# Patient Record
Sex: Female | Born: 1950 | Race: White | Hispanic: No | Marital: Married | Smoking: Never smoker
Health system: Southern US, Community
[De-identification: ages and names within clinical notes are randomized; demographics above are authoritative.]

## PROBLEM LIST (undated history)

## (undated) DIAGNOSIS — C801 Malignant (primary) neoplasm, unspecified: Secondary | ICD-10-CM

## (undated) DIAGNOSIS — M1990 Unspecified osteoarthritis, unspecified site: Secondary | ICD-10-CM

## (undated) DIAGNOSIS — E785 Hyperlipidemia, unspecified: Secondary | ICD-10-CM

## (undated) DIAGNOSIS — G4730 Sleep apnea, unspecified: Secondary | ICD-10-CM

## (undated) DIAGNOSIS — K219 Gastro-esophageal reflux disease without esophagitis: Secondary | ICD-10-CM

## (undated) DIAGNOSIS — I10 Essential (primary) hypertension: Secondary | ICD-10-CM

## (undated) DIAGNOSIS — M719 Bursopathy, unspecified: Secondary | ICD-10-CM

## (undated) DIAGNOSIS — E119 Type 2 diabetes mellitus without complications: Secondary | ICD-10-CM

## (undated) HISTORY — DX: Sleep apnea, unspecified: G47.30

## (undated) HISTORY — PX: EYE SURGERY: SHX253

## (undated) HISTORY — DX: Gastro-esophageal reflux disease without esophagitis: K21.9

## (undated) HISTORY — DX: Type 2 diabetes mellitus without complications: E11.9

## (undated) HISTORY — DX: Hyperlipidemia, unspecified: E78.5

## (undated) HISTORY — DX: Unspecified osteoarthritis, unspecified site: M19.90

## (undated) HISTORY — PX: OTHER SURGICAL HISTORY: SHX169

## (undated) HISTORY — PX: TUBAL LIGATION: SHX77

## (undated) HISTORY — PX: DILATION AND CURETTAGE OF UTERUS: SHX78

## (undated) HISTORY — DX: Bursopathy, unspecified: M71.9

---

## 2001-07-14 ENCOUNTER — Other Ambulatory Visit: Admission: RE | Admit: 2001-07-14 | Discharge: 2001-07-14 | Payer: Self-pay | Admitting: Family Medicine

## 2007-07-03 ENCOUNTER — Ambulatory Visit: Payer: Self-pay | Admitting: General Practice

## 2008-02-01 ENCOUNTER — Ambulatory Visit: Payer: Self-pay | Admitting: General Practice

## 2008-09-03 ENCOUNTER — Ambulatory Visit: Payer: Self-pay | Admitting: General Practice

## 2009-07-22 ENCOUNTER — Emergency Department: Payer: Self-pay | Admitting: Emergency Medicine

## 2010-08-11 ENCOUNTER — Ambulatory Visit: Payer: Self-pay

## 2010-12-27 ENCOUNTER — Emergency Department: Payer: Self-pay | Admitting: Unknown Physician Specialty

## 2011-04-14 ENCOUNTER — Ambulatory Visit: Payer: Self-pay | Admitting: Family Medicine

## 2011-08-17 ENCOUNTER — Ambulatory Visit: Payer: Self-pay

## 2012-08-24 ENCOUNTER — Ambulatory Visit: Payer: Self-pay

## 2013-08-28 ENCOUNTER — Ambulatory Visit: Payer: Self-pay | Admitting: *Deleted

## 2014-08-29 ENCOUNTER — Ambulatory Visit: Payer: Self-pay

## 2015-03-11 ENCOUNTER — Telehealth: Payer: Self-pay | Admitting: Unknown Physician Specialty

## 2015-03-11 MED ORDER — TELMISARTAN-HCTZ 80-25 MG PO TABS: 1.0000 | ORAL_TABLET | Freq: Every day | ORAL | Status: DC

## 2015-03-11 NOTE — Telephone Encounter (Signed)
Routing to provider. Medication is not in epic yet, but is in practice partner and patient chart number is [id number].

## 2015-03-11 NOTE — Telephone Encounter (Signed)
[facility] [place] called stated pt needs refill on:  Telmisartan  Thanks.

## 2015-03-18 ENCOUNTER — Other Ambulatory Visit: Payer: Self-pay

## 2015-03-18 MED ORDER — INVOKANA 300 MG PO TABS: 300.0000 mg | ORAL_TABLET | Freq: Every day | ORAL | Status: DC

## 2015-04-08 DIAGNOSIS — E119 Type 2 diabetes mellitus without complications: Secondary | ICD-10-CM | POA: Insufficient documentation

## 2015-04-08 DIAGNOSIS — J309 Allergic rhinitis, unspecified: Secondary | ICD-10-CM | POA: Insufficient documentation

## 2015-04-08 DIAGNOSIS — E1159 Type 2 diabetes mellitus with other circulatory complications: Secondary | ICD-10-CM | POA: Insufficient documentation

## 2015-04-08 DIAGNOSIS — I10 Essential (primary) hypertension: Secondary | ICD-10-CM

## 2015-04-08 DIAGNOSIS — L719 Rosacea, unspecified: Secondary | ICD-10-CM | POA: Insufficient documentation

## 2015-04-08 DIAGNOSIS — E669 Obesity, unspecified: Secondary | ICD-10-CM | POA: Insufficient documentation

## 2015-04-08 DIAGNOSIS — E785 Hyperlipidemia, unspecified: Secondary | ICD-10-CM

## 2015-04-08 DIAGNOSIS — K219 Gastro-esophageal reflux disease without esophagitis: Secondary | ICD-10-CM | POA: Insufficient documentation

## 2015-04-08 DIAGNOSIS — I4891 Unspecified atrial fibrillation: Secondary | ICD-10-CM

## 2015-04-08 DIAGNOSIS — E1169 Type 2 diabetes mellitus with other specified complication: Secondary | ICD-10-CM | POA: Insufficient documentation

## 2015-04-09 ENCOUNTER — Ambulatory Visit (INDEPENDENT_AMBULATORY_CARE_PROVIDER_SITE_OTHER): Payer: PRIVATE HEALTH INSURANCE | Admitting: Unknown Physician Specialty

## 2015-04-09 ENCOUNTER — Encounter: Payer: Self-pay | Admitting: Unknown Physician Specialty

## 2015-04-09 VITALS — BP 135/74 | HR 82 | Temp 97.9°F | Ht 62.6 in | Wt 175.6 lb

## 2015-04-09 DIAGNOSIS — E119 Type 2 diabetes mellitus without complications: Secondary | ICD-10-CM

## 2015-04-09 DIAGNOSIS — E785 Hyperlipidemia, unspecified: Secondary | ICD-10-CM

## 2015-04-09 DIAGNOSIS — I10 Essential (primary) hypertension: Secondary | ICD-10-CM | POA: Diagnosis not present

## 2015-04-09 LAB — BAYER DCA HB A1C WAIVED: HB A1C: 7.1 % — AB (ref <7.0)

## 2015-04-09 MED ORDER — SIMVASTATIN 20 MG PO TABS: 20.0000 mg | ORAL_TABLET | Freq: Every day | ORAL | Status: DC

## 2015-04-09 MED ORDER — METFORMIN HCL 1000 MG PO TABS: 1000.0000 mg | ORAL_TABLET | Freq: Two times a day (BID) | ORAL | Status: DC

## 2015-04-09 MED ORDER — INVOKANA 300 MG PO TABS: 300.0000 mg | ORAL_TABLET | Freq: Every day | ORAL | Status: DC

## 2015-04-09 MED ORDER — JANUVIA 100 MG PO TABS: 100.0000 mg | ORAL_TABLET | Freq: Every day | ORAL | Status: DC

## 2015-04-09 MED ORDER — TELMISARTAN-HCTZ 80-25 MG PO TABS: 1.0000 | ORAL_TABLET | Freq: Every day | ORAL | Status: DC

## 2015-04-09 NOTE — Assessment & Plan Note (Signed)
Needs Lipid panel

## 2015-04-09 NOTE — Assessment & Plan Note (Signed)
Hgb A1C is 7.1 down from 8.1 last visit.  She would like to contine her diet and exercise changes to get to goal.

## 2015-04-09 NOTE — Assessment & Plan Note (Signed)
BP is excellent.  Continue present medications.

## 2015-04-09 NOTE — Progress Notes (Signed)
BP 135/74 mmHg  Pulse 82  Temp(Src) 97.9 F (36.6 C)  Ht 5' 2.6" (1.59 m)  Wt 175 lb 9.6 oz (79.652 kg)  BMI 31.51 kg/m2  SpO2 96%  LMP 07/13/2000 (Approximate)   Subjective:    Patient ID: Charlene Juarez, female    DOB: [birth year], 64 y.o.   MRN: [id number]  HPI: Charlene Juarez is a 64 y.o. female  Chief Complaint  Patient presents with  . Diabetes  . Hypertension  . Hyperlipidemia   Of note, pt is refusing all labwork besides Hgb A1C.  She states she will get them at work but they are "between nurses."  She will talk to them tomorrow.    Diabetes She presents for her follow-up diabetic visit. She has type 2 diabetes mellitus. Her disease course has been stable. There are no hypoglycemic associated symptoms. Pertinent negatives for diabetes include no blurred vision, no chest pain, no fatigue, no foot paresthesias, no foot ulcerations, no polydipsia, no polyphagia, no polyuria, no visual change, no weakness and no weight loss. There are no hypoglycemic complications. There are no diabetic complications. Her weight is stable. She is following a generally healthy diet. She monitors blood glucose at home 3-4 x per week. Her breakfast blood glucose range is generally 130-140 mg/dl. Her dinner blood glucose range is generally 140-180 mg/dl.  Hypertension This is a chronic problem. The problem is controlled. Pertinent negatives include no blurred vision, chest pain or palpitations. There are no compliance problems.   Hyperlipidemia This is a chronic problem. The problem is controlled. Pertinent negatives include no chest pain. There are no compliance problems.    Hgb A1C 8.1 last visit    Relevant past medical, surgical, family and social history reviewed and updated as indicated. Interim medical history since our last visit reviewed. Allergies and medications reviewed and updated.  Review of Systems  Constitutional: Negative for weight loss and fatigue.   Eyes: Negative for blurred vision.  Cardiovascular: Negative for chest pain and palpitations.  Endocrine: Negative for polydipsia, polyphagia and polyuria.  Neurological: Negative for weakness.    Per HPI unless specifically indicated above     Objective:    BP 135/74 mmHg  Pulse 82  Temp(Src) 97.9 F (36.6 C)  Ht 5' 2.6" (1.59 m)  Wt 175 lb 9.6 oz (79.652 kg)  BMI 31.51 kg/m2  SpO2 96%  LMP 07/13/2000 (Approximate)  Wt Readings from Last 3 Encounters:  04/09/15 175 lb 9.6 oz (79.652 kg)  12/30/14 182 lb (82.555 kg)    Physical Exam  Constitutional: She is oriented to person, place, and time. She appears well-developed and well-nourished. No distress.  HENT:  Head: Normocephalic and atraumatic.  Eyes: Conjunctivae and lids are normal. Right eye exhibits no discharge. Left eye exhibits no discharge. No scleral icterus.  Cardiovascular: Normal rate, regular rhythm and normal heart sounds.   Pulmonary/Chest: Effort normal and breath sounds normal. No respiratory distress.  Abdominal: Soft. Normal appearance and bowel sounds are normal. She exhibits no distension. There is no splenomegaly or hepatomegaly. There is no tenderness.  Musculoskeletal: Normal range of motion.  Neurological: She is alert and oriented to person, place, and time.  Skin: Skin is intact. No rash noted. No pallor.  Psychiatric: She has a normal mood and affect. Her behavior is normal. Judgment and thought content normal.  Vitals reviewed.   No results found for this or any previous visit.    Assessment & Plan:   Problem List Items  Addressed This Visit      Cardiovascular and Mediastinum   Hypertension    BP is excellent.  Continue present medications.        Relevant Medications   telmisartan-hydrochlorothiazide (MICARDIS HCT) 80-25 MG per tablet   simvastatin (ZOCOR) 20 MG tablet     Endocrine   Diabetes - Primary    Hgb A1C is 7.1 down from 8.1 last visit.  She would like to contine her  diet and exercise changes to get to goal.        Relevant Medications   JANUVIA 100 MG tablet   metFORMIN (GLUCOPHAGE) 1000 MG tablet   telmisartan-hydrochlorothiazide (MICARDIS HCT) 80-25 MG per tablet   INVOKANA 300 MG TABS tablet   simvastatin (ZOCOR) 20 MG tablet   Other Relevant Orders   Bayer DCA Hb A1c Waived     Other   Hyperlipidemia    Needs Lipid panel      Relevant Medications   telmisartan-hydrochlorothiazide (MICARDIS HCT) 80-25 MG per tablet   simvastatin (ZOCOR) 20 MG tablet      I am writing an order to get CMP, Uric Acid, Microalbumin, and Lipid panel at work.    Follow up plan: Return in about 3 months (around 07/10/2015) for DM and Hgb A1C.

## 2015-05-09 ENCOUNTER — Encounter: Payer: Self-pay | Admitting: Unknown Physician Specialty

## 2015-05-09 ENCOUNTER — Ambulatory Visit (INDEPENDENT_AMBULATORY_CARE_PROVIDER_SITE_OTHER): Payer: PRIVATE HEALTH INSURANCE | Admitting: Unknown Physician Specialty

## 2015-05-09 VITALS — BP 137/81 | HR 88 | Temp 97.4°F | Ht 61.7 in | Wt 178.4 lb

## 2015-05-09 DIAGNOSIS — J4531 Mild persistent asthma with (acute) exacerbation: Secondary | ICD-10-CM | POA: Diagnosis not present

## 2015-05-09 DIAGNOSIS — J40 Bronchitis, not specified as acute or chronic: Secondary | ICD-10-CM | POA: Diagnosis not present

## 2015-05-09 DIAGNOSIS — M7552 Bursitis of left shoulder: Secondary | ICD-10-CM | POA: Diagnosis not present

## 2015-05-09 DIAGNOSIS — J45909 Unspecified asthma, uncomplicated: Secondary | ICD-10-CM | POA: Insufficient documentation

## 2015-05-09 MED ORDER — MELOXICAM 15 MG PO TABS: 15.0000 mg | ORAL_TABLET | Freq: Every day | ORAL | Status: DC

## 2015-05-09 MED ORDER — BUDESONIDE-FORMOTEROL FUMARATE 160-4.5 MCG/ACT IN AERO: 2.0000 | INHALATION_SPRAY | Freq: Two times a day (BID) | RESPIRATORY_TRACT | Status: DC

## 2015-05-09 MED ORDER — AZITHROMYCIN 250 MG PO TABS: ORAL_TABLET | ORAL | Status: DC

## 2015-05-09 NOTE — Progress Notes (Signed)
BP 137/81 mmHg  Pulse 88  Temp(Src) 97.4 F (36.3 C)  Ht 5' 1.7" (1.567 m)  Wt 178 lb 6.4 oz (80.922 kg)  BMI 32.96 kg/m2  SpO2 94%  LMP 07/13/2000 (Approximate)   Subjective:    Patient ID: Charlene Juarez, female    DOB: April 12, 1951, 64 y.o.   MRN: [id number]  HPI: Charlene Juarez is a 64 y.o. female  Chief Complaint  Patient presents with  . Shoulder Pain    patient has some left shoulder pain that started Wednesday after lifting heaving packages at work   Shoulder Pain  The pain is present in the left shoulder. This is a chronic (States she always has pain left shoulder.  3 days ago hung 19 pound packages at work) problem. Episode onset: 3 days ago. There has been no history of extremity trauma. The problem occurs constantly. The quality of the pain is described as aching. Pertinent negatives include no fever. Exacerbated by: lifting arm. She has tried NSAIDS for the symptoms. The treatment provided mild relief.  Cough Episode onset: 3-4 weeks ago cold symptoms.  Now has a dry cough. The problem has been unchanged (in the past week). Episode frequency: In the AM worse and when she eats. The cough is non-productive. Associated symptoms include wheezing. Pertinent negatives include no chest pain, ear pain, fever, nasal congestion, rhinorrhea, sore throat or shortness of breath. She has tried a beta-agonist inhaler for the symptoms. The treatment provided mild relief.    Relevant past medical, surgical, family and social history reviewed and updated as indicated. Interim medical history since our last visit reviewed. Allergies and medications reviewed and updated.  Review of Systems  Constitutional: Negative for fever.  HENT: Negative for ear pain, rhinorrhea and sore throat.   Respiratory: Positive for cough and wheezing. Negative for shortness of breath.   Cardiovascular: Negative for chest pain.    Per HPI unless specifically indicated above      Objective:    BP 137/81 mmHg  Pulse 88  Temp(Src) 97.4 F (36.3 C)  Ht 5' 1.7" (1.567 m)  Wt 178 lb 6.4 oz (80.922 kg)  BMI 32.96 kg/m2  SpO2 94%  LMP 07/13/2000 (Approximate)  Wt Readings from Last 3 Encounters:  05/09/15 178 lb 6.4 oz (80.922 kg)  04/09/15 175 lb 9.6 oz (79.652 kg)  12/30/14 182 lb (82.555 kg)    Physical Exam  Constitutional: She is oriented to person, place, and time. She appears well-developed and well-nourished. No distress.  HENT:  Head: Normocephalic and atraumatic.  Eyes: Conjunctivae and lids are normal. Right eye exhibits no discharge. Left eye exhibits no discharge. No scleral icterus.  Cardiovascular: Normal rate and regular rhythm.   Pulmonary/Chest: Effort normal. No respiratory distress. She has wheezes.  Abdominal: Normal appearance and bowel sounds are normal. She exhibits no distension. There is no splenomegaly or hepatomegaly. There is no tenderness.  Musculoskeletal: Normal range of motion.  Neurological: She is alert and oriented to person, place, and time.  Skin: Skin is warm, dry and intact. No rash noted. No pallor.  Psychiatric: She has a normal mood and affect. Her behavior is normal. Judgment and thought content normal.  Nursing note reviewed.   Results for orders placed or performed in visit on 04/09/15  Bayer DCA Hb A1c Waived  Result Value Ref Range   Bayer DCA Hb A1c Waived 7.1 (H) <7.0 %      Assessment & Plan:   Problem List Items Addressed  This Visit      Unprioritized   Reactive airway disease    Problem with wheezing since "I bcame a diabetic." Will add Xymbicort for just 1 inhaler to get over acute exacerbation.  Will need Spirometry       Other Visit Diagnoses    Shoulder bursitis, left    -  Primary    Secondary to overhead lifting at work.  Will rx Meloxicam but needs to report to worman's comp.  Practice pendulum swings and wall walking     Bronchitis        Rx for Z pack.          Follow up  plan: Return for at regular return visit.

## 2015-05-09 NOTE — Assessment & Plan Note (Signed)
Problem with wheezing since "I bcame a diabetic." Will add Xymbicort for just 1 inhaler to get over acute exacerbation.  Will need Spirometry

## 2015-05-12 ENCOUNTER — Ambulatory Visit (INDEPENDENT_AMBULATORY_CARE_PROVIDER_SITE_OTHER): Payer: PRIVATE HEALTH INSURANCE | Admitting: Unknown Physician Specialty

## 2015-05-12 ENCOUNTER — Encounter: Payer: Self-pay | Admitting: Unknown Physician Specialty

## 2015-05-12 VITALS — BP 153/83 | HR 91 | Temp 98.3°F | Wt 180.6 lb

## 2015-05-12 DIAGNOSIS — Y990 Civilian activity done for income or pay: Secondary | ICD-10-CM

## 2015-05-12 DIAGNOSIS — M7582 Other shoulder lesions, left shoulder: Secondary | ICD-10-CM

## 2015-05-12 NOTE — Progress Notes (Signed)
BP 153/83 mmHg  Pulse 91  Temp(Src) 98.3 F (36.8 C)  Wt 180 lb 9.6 oz (81.92 kg)  SpO2 96%  LMP 07/13/2000 (Approximate)   Subjective:    Patient ID: Charlene Juarez, female    DOB: 01/22/1951, 64 y.o.   MRN: [id number]  HPI: Charlene Juarez is a 64 y.o. female  Chief Complaint  Patient presents with  . 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  She reported it to [employer] comp and sent her back her to do a drug screen.    Shoulder Pain  The pain is present in the left shoulder. This is a new problem. The current episode started 1 to 4 weeks ago. There has been no history of extremity trauma (following overhead work). The problem occurs constantly. The problem has been unchanged. The quality of the pain is described as aching. The pain is moderate. Associated symptoms include a limited range of motion and stiffness. Pertinent negatives include no fever. The symptoms are aggravated by activity and lying down. The treatment provided no relief. Doing wall walking and pendulum swings.  Feeling better since then  Relevant past medical, surgical, family and social history reviewed and updated as indicated. Interim medical history since our last visit reviewed. Allergies and medications reviewed and updated.  Review of Systems  Constitutional: Negative for fever.  Musculoskeletal: Positive for stiffness.    Per HPI unless specifically indicated above  Relevant past medical, surgical, family and social history reviewed and updated as indicated. Interim medical history since our last visit reviewed. Allergies and medications reviewed and updated.  Review of Systems  Per HPI unless specifically indicated above     Objective:    BP 153/83 mmHg  Pulse 91  Temp(Src) 98.3 F (36.8 C)  Wt 180 lb 9.6 oz (81.92 kg)  SpO2 96%  LMP 07/13/2000  (Approximate)  Wt Readings from Last 3 Encounters:  05/12/15 180 lb 9.6 oz (81.92 kg)  05/09/15 178 lb 6.4 oz (80.922 kg)  04/09/15 175 lb 9.6 oz (79.652 kg)    Physical Exam  Constitutional: She is oriented to person, place, and time. She appears well-developed and well-nourished. No distress.  HENT:  Head: Normocephalic and atraumatic.  Eyes: Conjunctivae and lids are normal. Right eye exhibits no discharge. Left eye exhibits no discharge. No scleral icterus.  Cardiovascular: Normal rate and regular rhythm.   Pulmonary/Chest: Effort normal. No respiratory distress.  Abdominal: Normal appearance and bowel sounds are normal. She exhibits no distension. There is no splenomegaly or hepatomegaly. There is no tenderness.  Musculoskeletal:       Left shoulder: She exhibits decreased range of motion. She exhibits no tenderness and no swelling.  Neurological: She is alert and oriented to person, place, and time.  Skin: Skin is intact. No rash noted. No pallor.  Psychiatric: She has a normal mood and affect. Her behavior is normal. Judgment and thought content normal.    Results for orders placed or performed in visit on 04/09/15  Bayer DCA Hb A1c Waived  Result Value Ref Range   Bayer DCA Hb A1c Waived 7.1 (H) <7.0 %      Assessment & Plan:   Problem List Items Addressed This Visit    None    Visit Diagnoses    Work related injury    -  Primary  Relevant Orders    Drugs of abuse scrn w alc, routine urine    Rotator cuff tendonitis, left           Refer to physical therapy.  Talked to her work and they said their workman's comp provider will refer to PT.   Letter for work limitations written.    Follow up plan: Return in about 2 weeks (around 05/26/2015).

## 2015-05-13 ENCOUNTER — Encounter: Payer: Self-pay | Admitting: Unknown Physician Specialty

## 2015-05-13 LAB — URINE DRUGS OF ABUSE SCREEN W ALC, ROUTINE (REF LAB)
Amphetamines, Urine: NEGATIVE ng/mL
Barbiturate Quant, Ur: NEGATIVE ng/mL
Benzodiazepine Quant, Ur: NEGATIVE ng/mL
COCAINE (METAB.): NEGATIVE ng/mL
Cannabinoid Quant, Ur: NEGATIVE ng/mL
Ethanol U, Quan: NEGATIVE %
Methadone Screen, Urine: NEGATIVE ng/mL
OPIATE QUANT UR: NEGATIVE ng/mL
PCP QUANT UR: NEGATIVE ng/mL
PROPOXYPHENE: NEGATIVE ng/mL

## 2015-05-27 ENCOUNTER — Ambulatory Visit: Payer: Self-pay | Admitting: Unknown Physician Specialty

## 2015-06-04 ENCOUNTER — Other Ambulatory Visit: Payer: Self-pay | Admitting: Unknown Physician Specialty

## 2015-06-04 DIAGNOSIS — D696 Thrombocytopenia, unspecified: Secondary | ICD-10-CM | POA: Insufficient documentation

## 2015-06-04 NOTE — Telephone Encounter (Signed)
Labs July 2016 reviewed (scanned copy) Invokana approved Platelet count was low; I will deny the meloxicam and leave for primary provider who is out of the office this week; she'll be back in five days

## 2015-06-06 ENCOUNTER — Other Ambulatory Visit: Payer: Self-pay

## 2015-06-06 MED ORDER — BUDESONIDE-FORMOTEROL FUMARATE 160-4.5 MCG/ACT IN AERO: 2.0000 | INHALATION_SPRAY | Freq: Two times a day (BID) | RESPIRATORY_TRACT | Status: DC

## 2015-06-06 NOTE — Telephone Encounter (Signed)
[facility] [place].

## 2015-06-11 ENCOUNTER — Ambulatory Visit (INDEPENDENT_AMBULATORY_CARE_PROVIDER_SITE_OTHER): Payer: PRIVATE HEALTH INSURANCE | Admitting: Unknown Physician Specialty

## 2015-06-11 ENCOUNTER — Encounter: Payer: Self-pay | Admitting: Unknown Physician Specialty

## 2015-06-11 VITALS — BP 129/76 | HR 75 | Temp 98.1°F | Ht 61.2 in | Wt 177.0 lb

## 2015-06-11 DIAGNOSIS — J4520 Mild intermittent asthma, uncomplicated: Secondary | ICD-10-CM | POA: Insufficient documentation

## 2015-06-11 DIAGNOSIS — M7551 Bursitis of right shoulder: Secondary | ICD-10-CM | POA: Diagnosis not present

## 2015-06-11 DIAGNOSIS — J4540 Moderate persistent asthma, uncomplicated: Secondary | ICD-10-CM

## 2015-06-11 DIAGNOSIS — M7550 Bursitis of unspecified shoulder: Secondary | ICD-10-CM | POA: Insufficient documentation

## 2015-06-11 NOTE — Progress Notes (Signed)
BP 129/76 mmHg  Pulse 75  Temp(Src) 98.1 F (36.7 C)  Ht 5' 1.2" (1.554 m)  Wt 177 lb (80.287 kg)  BMI 33.25 kg/m2  SpO2 97%  LMP 07/13/2000 (Approximate)   Subjective:    Patient ID: Charlene Juarez, female    DOB: [birth year], 64 y.o.   MRN: [id number]  HPI: Charlene Juarez is a 64 y.o. female  Chief Complaint  Patient presents with  . Shoulder Pain    pt states right shoulder has been hurting for the last 3 or 4 days  . Medication Refill    pt states she would like a refill on Symbicort and Meloxicam   Shoulder Pain  The pain is present in the right shoulder. This is a chronic (right shoulder bothers off and on.  Would like Meloxicam) problem. The current episode started in the past 7 days. There has been no history of extremity trauma (but aggravated push mopping at work). The problem occurs constantly. The problem has been waxing and waning. The quality of the pain is described as aching. The pain is moderate. Pertinent negatives include no fever, inability to bear weight, itching, joint locking, joint swelling, limited range of motion, numbness, stiffness or tingling. The symptoms are aggravated by activity. Treatments tried: Meloxicam. Her past medical history is significant for diabetes. There is no history of gout, osteoarthritis or rheumatoid arthritis.  Cough Chronicity: When she gets hot she starts coughing.  Wants a refill of symbicort. The problem has been unchanged. The cough is non-productive. Associated symptoms include nasal congestion and sweats. Pertinent negatives include no fever. Nothing aggravates the symptoms. The treatment provided moderate relief. Her past medical history is significant for asthma.   She would also like Lidoderm patches.      Relevant past medical, surgical, family and social history reviewed and updated as indicated. Interim medical history since our last visit reviewed. Allergies and medications reviewed and  updated.  Review of Systems  Constitutional: Negative for fever.  Respiratory: Positive for cough.   Musculoskeletal: Negative for stiffness and gout.  Skin: Negative for itching.  Neurological: Negative for tingling and numbness.    Per HPI unless specifically indicated above     Objective:    BP 129/76 mmHg  Pulse 75  Temp(Src) 98.1 F (36.7 C)  Ht 5' 1.2" (1.554 m)  Wt 177 lb (80.287 kg)  BMI 33.25 kg/m2  SpO2 97%  LMP 07/13/2000 (Approximate)  Wt Readings from Last 3 Encounters:  06/11/15 177 lb (80.287 kg)  05/12/15 180 lb 9.6 oz (81.92 kg)  05/09/15 178 lb 6.4 oz (80.922 kg)    Physical Exam  Constitutional: She is oriented to person, place, and time. She appears well-developed and well-nourished. No distress.  HENT:  Head: Normocephalic and atraumatic.  Eyes: Conjunctivae and lids are normal. Right eye exhibits no discharge. Left eye exhibits no discharge. No scleral icterus.  Cardiovascular: Normal rate, regular rhythm and normal heart sounds.   Pulmonary/Chest: Effort normal and breath sounds normal. No respiratory distress.  Abdominal: Normal appearance and bowel sounds are normal. She exhibits no distension. There is no splenomegaly or hepatomegaly. There is no tenderness.  Musculoskeletal: Normal range of motion.       Right shoulder: She exhibits normal range of motion, no tenderness, no bony tenderness, no swelling, no effusion and no crepitus.  Positive empty can  Neurological: She is alert and oriented to person, place, and time.  Skin: Skin is warm, dry and intact. No  rash noted. No pallor.  Psychiatric: She has a normal mood and affect. Her behavior is normal. Judgment and thought content normal.  Nursing note and vitals reviewed.     Assessment & Plan:   Problem List Items Addressed This Visit      Unprioritized   Asthma, moderate persistent - Primary    Refill Symbicort. Spirometry excellent      Relevant Orders   Spirometry with Graph    Shoulder bursitis    Refill Meloxicam.  Refusing PT.  Wants Lidoderm patches which a friend gave her         Unable to put rx's in computer due to unknown technical problems.    Follow up plan: No Follow-up on file.   Level 4 visit.  Due to see me in 1 month for DM.  Unable to close visit at this time due to unknown techenical problems.

## 2015-06-11 NOTE — Assessment & Plan Note (Signed)
Refill Meloxicam.  Refusing PT.  Wants Lidoderm patches which a friend gave her

## 2015-06-11 NOTE — Assessment & Plan Note (Addendum)
Refill Symbicort. Spirometry excellent

## 2015-06-25 ENCOUNTER — Other Ambulatory Visit: Payer: Self-pay | Admitting: Unknown Physician Specialty

## 2015-07-02 ENCOUNTER — Other Ambulatory Visit: Payer: Self-pay | Admitting: Unknown Physician Specialty

## 2015-07-04 ENCOUNTER — Other Ambulatory Visit: Payer: Self-pay

## 2015-07-04 MED ORDER — SIMVASTATIN 20 MG PO TABS: 20.0000 mg | ORAL_TABLET | Freq: Every day | ORAL | Status: DC

## 2015-07-04 NOTE — Telephone Encounter (Signed)
PATIENT: Charlene Juarez DOB: Oct 20, 1950 PHARMACY: [facility] [place] LAST VISIT: 06/11/2015  Patient requests simvastatin 20 mg tab # 90

## 2015-07-15 ENCOUNTER — Ambulatory Visit: Payer: PRIVATE HEALTH INSURANCE | Admitting: Unknown Physician Specialty

## 2015-07-16 ENCOUNTER — Ambulatory Visit (INDEPENDENT_AMBULATORY_CARE_PROVIDER_SITE_OTHER): Payer: BLUE CROSS/BLUE SHIELD | Admitting: Unknown Physician Specialty

## 2015-07-16 ENCOUNTER — Encounter: Payer: Self-pay | Admitting: Unknown Physician Specialty

## 2015-07-16 VITALS — BP 144/81 | HR 80 | Temp 97.7°F | Ht 62.5 in | Wt 175.6 lb

## 2015-07-16 DIAGNOSIS — M7551 Bursitis of right shoulder: Secondary | ICD-10-CM

## 2015-07-16 DIAGNOSIS — E785 Hyperlipidemia, unspecified: Secondary | ICD-10-CM | POA: Diagnosis not present

## 2015-07-16 DIAGNOSIS — J4540 Moderate persistent asthma, uncomplicated: Secondary | ICD-10-CM

## 2015-07-16 DIAGNOSIS — E119 Type 2 diabetes mellitus without complications: Secondary | ICD-10-CM | POA: Diagnosis not present

## 2015-07-16 DIAGNOSIS — I10 Essential (primary) hypertension: Secondary | ICD-10-CM

## 2015-07-16 MED ORDER — HYDROCODONE-ACETAMINOPHEN 5-325 MG PO TABS: 1.0000 | ORAL_TABLET | Freq: Four times a day (QID) | ORAL | Status: DC | PRN

## 2015-07-16 NOTE — Assessment & Plan Note (Signed)
Under excellent control today with Hgb A1C 6.9

## 2015-07-16 NOTE — Assessment & Plan Note (Signed)
-  

## 2015-07-16 NOTE — Assessment & Plan Note (Signed)
Stable, continue present medications.   

## 2015-07-16 NOTE — Assessment & Plan Note (Signed)
A little high today as in pain and not sleeping

## 2015-07-16 NOTE — Progress Notes (Signed)
BP 144/81 mmHg  Pulse 80  Temp(Src) 97.7 F (36.5 C)  Ht 5' 2.5" (1.588 m)  Wt 175 lb 9.6 oz (79.652 kg)  BMI 31.59 kg/m2  SpO2 98%  LMP 07/13/2000 (Approximate)   Subjective:    Patient ID: Charlene Juarez, female    DOB: 15-Sep-1951, 64 y.o.   MRN: [id number]  HPI: Charlene Juarez is a 64 y.o. female  Chief Complaint  Patient presents with  . Diabetes  . Hyperlipidemia  . Hypertension  . Shoulder Pain    pt states she is still having pain in both shoulders and she cannot sleep because of pain  . Irritable Bowel Syndrome    pt states she has been having IBS problems that started years ago   Pt is here with her son who is an Therapist, sports. Labs are reviewed from the outside.     Shoulder Pt states she has a different insurance as she retired due to her "shoulder situation."  She wants to try for disability.  She states she can't sleep at night because she has sharp pain bilateral shoulders.  Her son is very concerned about this.  She is taking 2 Meloxicam/day.  Both shoulders hurt, right worse than left.  Went to the [facility] doctor who said it was a strain by lifting heavy yarns as no work accomodations could be made.  Light duty was given but only for a week.   Diabetes  The city drew her labs and they were supposed to be faxing her labs.  BG at home ranges from 109-145.  She is doing well with present medications.    Hypertension/ hyperlipidemia Stable on present medications.  LDL is good at 69.    Asthma She is not taking the Symbicort and instead taking her Albuterol when she gets SOB.  She does have a chronic dry cough.  Her son wonders if it is the Micardis.   IBS  Has had diarrhea since 2009.  She does have diarrhea and feels she needs to go to the bathroom multiple/tims/day.  She states this started before being on Metformin D  Relevant past medical, surgical, family and social history reviewed and updated as indicated. Interim medical history since our last  visit reviewed. Allergies and medications reviewed and updated.  Review of Systems  Per HPI unless specifically indicated above     Objective:    BP 144/81 mmHg  Pulse 80  Temp(Src) 97.7 F (36.5 C)  Ht 5' 2.5" (1.588 m)  Wt 175 lb 9.6 oz (79.652 kg)  BMI 31.59 kg/m2  SpO2 98%  LMP 07/13/2000 (Approximate)  Wt Readings from Last 3 Encounters:  07/16/15 175 lb 9.6 oz (79.652 kg)  06/11/15 177 lb (80.287 kg)  05/12/15 180 lb 9.6 oz (81.92 kg)    Physical Exam  Constitutional: She is oriented to person, place, and time. She appears well-developed and well-nourished. No distress.  HENT:  Head: Normocephalic and atraumatic.  Eyes: Conjunctivae and lids are normal. Right eye exhibits no discharge. Left eye exhibits no discharge. No scleral icterus.  Cardiovascular: Normal rate and regular rhythm.   Pulmonary/Chest: Effort normal. No respiratory distress.  Abdominal: Normal appearance. There is no splenomegaly or hepatomegaly.  Musculoskeletal: Normal range of motion.       Right shoulder: She exhibits tenderness, crepitus, pain and decreased strength. She exhibits normal range of motion, no bony tenderness, no swelling and no effusion.       Left shoulder: She exhibits tenderness, pain  and decreased strength. She exhibits normal range of motion and no swelling.  Positive empty can.  Minor impingement.  Tender along biceps tendon  Neurological: She is alert and oriented to person, place, and time.  Skin: Skin is intact. No rash noted. No pallor.  Psychiatric: She has a normal mood and affect. Her behavior is normal. Judgment and thought content normal.       Assessment & Plan:   Problem List Items Addressed This Visit      Unprioritized   Diabetes ([place])    Under excellent control today with Hgb A1C 6.9      Hypertension    A little high today as in pain and not sleeping      Hyperlipidemia    Stable, continue present medications.       Asthma, moderate persistent     Restart symbicort      Shoulder bursitis - Primary    Persistent and difficult problem causing her to "retire." I will refer to Orthopedics as she is considering disability.  Limited rx of #30 Hydrocodone 5 mg.        Relevant Orders   Ambulatory referral to Orthopedic Surgery       Follow up plan: Return in about 3 months (around 10/16/2015).

## 2015-07-16 NOTE — Assessment & Plan Note (Addendum)
Persistent and difficult problem causing her to "retire." I will refer to Orthopedics as she is considering disability.  Limited rx of #30 Hydrocodone 5 mg.

## 2015-07-17 ENCOUNTER — Other Ambulatory Visit: Payer: Self-pay | Admitting: Unknown Physician Specialty

## 2015-07-23 ENCOUNTER — Other Ambulatory Visit: Payer: Self-pay | Admitting: Family Medicine

## 2015-07-23 NOTE — Telephone Encounter (Signed)
Malachy Mood, I don't see this in her med list here or in PP.

## 2015-08-01 ENCOUNTER — Other Ambulatory Visit: Payer: Self-pay | Admitting: Family Medicine

## 2015-08-01 NOTE — Telephone Encounter (Signed)
Routing to provider  

## 2015-08-23 ENCOUNTER — Other Ambulatory Visit: Payer: Self-pay | Admitting: Family Medicine

## 2015-08-23 ENCOUNTER — Other Ambulatory Visit: Payer: Self-pay | Admitting: Unknown Physician Specialty

## 2015-08-25 ENCOUNTER — Telehealth: Payer: Self-pay | Admitting: Unknown Physician Specialty

## 2015-08-25 ENCOUNTER — Other Ambulatory Visit: Payer: Self-pay | Admitting: Unknown Physician Specialty

## 2015-08-25 MED ORDER — ONETOUCH ULTRA SYSTEM W/DEVICE KIT: 1.0000 | PACK | Freq: Once | Status: DC

## 2015-08-25 NOTE — Telephone Encounter (Signed)
Pt would like to have rx for one touch ultra meter sent to [facility] [place].

## 2015-08-25 NOTE — Telephone Encounter (Signed)
Routing to provider  

## 2015-09-18 ENCOUNTER — Other Ambulatory Visit: Payer: Self-pay | Admitting: Unknown Physician Specialty

## 2015-09-20 ENCOUNTER — Other Ambulatory Visit: Payer: Self-pay | Admitting: Unknown Physician Specialty

## 2015-09-23 ENCOUNTER — Telehealth: Payer: Self-pay | Admitting: Unknown Physician Specialty

## 2015-09-23 NOTE — Telephone Encounter (Signed)
Called and let patient know that patches were refilled 09/18/15.

## 2015-09-23 NOTE — Telephone Encounter (Signed)
#  30 patches were sent on 12/29

## 2015-09-23 NOTE — Telephone Encounter (Signed)
Routing to provider  

## 2015-09-23 NOTE — Telephone Encounter (Signed)
[facility] [place]  Patient needs generic medication Lidoderm sent to her pharmacy.

## 2015-09-26 ENCOUNTER — Telehealth: Payer: Self-pay

## 2015-09-26 ENCOUNTER — Telehealth: Payer: Self-pay | Admitting: Unknown Physician Specialty

## 2015-09-26 MED ORDER — TRAMADOL HCL 50 MG PO TABS: 50.0000 mg | ORAL_TABLET | Freq: Three times a day (TID) | ORAL | Status: DC | PRN

## 2015-09-26 NOTE — Telephone Encounter (Signed)
Tramadol prescription faxed to [facility].

## 2015-09-26 NOTE — Telephone Encounter (Signed)
Called and spoke to patient. I let her know that Charlene Juarez wrote her a prescription for tramadol and it was faxed to her pharmacy.

## 2015-09-26 NOTE — Telephone Encounter (Signed)
I will write an rx for Tramadol.  But will need to be seen for any further refills.  You will probably need to fax it to the pharmacy

## 2015-09-26 NOTE — Telephone Encounter (Signed)
Called patient and she stated she went to orthopedics and they sent her for PT. She states since she started PT, her shoulder has gotten better but it still hurts. Patient states she has bills piling up because of her shoulder.

## 2015-09-26 NOTE — Telephone Encounter (Signed)
Patient wants to know why she doesn't have her pain medication. [facility] [place]. She would like to talk to you or either [place].

## 2015-09-26 NOTE — Telephone Encounter (Signed)
Malachy Mood stated Kristin Bruins needs to talk to [facility] about this.

## 2015-09-26 NOTE — Telephone Encounter (Signed)
Spoke to [place] about this patient's lidocaine patches for her shoulder. PA was denied because medical criteria was not met. When I talked to the patient the other day, she stated she was in a lot of pain. Is there anything else we can do or give the patient?

## 2015-09-26 NOTE — Telephone Encounter (Signed)
Patient called and left me a voicemail stating that she just talked to [facility] about the lidoderm patches. They say that Malachy Mood has to call [phone], extension [phone] and ask for a lady named Linette K. Patient stated Malachy Mood and Linette have to have a peer to peer consult to prove that she is using the lidoderm for her shoulder.

## 2015-09-26 NOTE — Telephone Encounter (Signed)
Called and asked for patient to please return my call.

## 2015-09-26 NOTE — Telephone Encounter (Signed)
Told pt she needs to be seen for appropriate documentation to justify Lidoderm patches.  She states she has no money to come in and will let us know if she can find a way.  I told her that the OTC are almost as strong as the prescription.

## 2015-09-26 NOTE — Telephone Encounter (Signed)
She was referred to orthopedics.  Did they release her from their care?

## 2015-09-28 ENCOUNTER — Other Ambulatory Visit: Payer: Self-pay | Admitting: Unknown Physician Specialty

## 2015-09-29 ENCOUNTER — Other Ambulatory Visit: Payer: Self-pay | Admitting: Unknown Physician Specialty

## 2015-10-16 LAB — HM DIABETES EYE EXAM

## 2015-12-04 ENCOUNTER — Other Ambulatory Visit: Payer: Self-pay | Admitting: Internal Medicine

## 2015-12-04 DIAGNOSIS — J45909 Unspecified asthma, uncomplicated: Secondary | ICD-10-CM

## 2015-12-04 DIAGNOSIS — I10 Essential (primary) hypertension: Secondary | ICD-10-CM

## 2015-12-30 ENCOUNTER — Ambulatory Visit: Payer: Disability Insurance | Attending: Internal Medicine

## 2015-12-30 DIAGNOSIS — I10 Essential (primary) hypertension: Secondary | ICD-10-CM | POA: Diagnosis not present

## 2015-12-30 DIAGNOSIS — E119 Type 2 diabetes mellitus without complications: Secondary | ICD-10-CM | POA: Diagnosis not present

## 2015-12-30 DIAGNOSIS — J45909 Unspecified asthma, uncomplicated: Secondary | ICD-10-CM | POA: Diagnosis present

## 2016-01-17 ENCOUNTER — Other Ambulatory Visit: Payer: Self-pay | Admitting: Unknown Physician Specialty

## 2016-03-03 ENCOUNTER — Other Ambulatory Visit: Payer: Self-pay | Admitting: Family Medicine

## 2016-03-03 DIAGNOSIS — Z1231 Encounter for screening mammogram for malignant neoplasm of breast: Secondary | ICD-10-CM

## 2016-03-16 ENCOUNTER — Other Ambulatory Visit: Payer: Self-pay | Admitting: Family Medicine

## 2016-03-16 ENCOUNTER — Ambulatory Visit
Admission: RE | Admit: 2016-03-16 | Discharge: 2016-03-16 | Disposition: A | Discharge status: Home / Self Care | Payer: BLUE CROSS/BLUE SHIELD | Source: Ambulatory Visit | Attending: Family Medicine | Admitting: Family Medicine

## 2016-03-16 DIAGNOSIS — Z1231 Encounter for screening mammogram for malignant neoplasm of breast: Secondary | ICD-10-CM | POA: Diagnosis not present

## 2016-03-19 ENCOUNTER — Ambulatory Visit: Payer: Disability Insurance

## 2016-03-21 ENCOUNTER — Other Ambulatory Visit: Payer: Self-pay | Admitting: Unknown Physician Specialty

## 2016-07-18 ENCOUNTER — Other Ambulatory Visit: Payer: Self-pay | Admitting: Unknown Physician Specialty

## 2016-09-19 ENCOUNTER — Other Ambulatory Visit: Payer: Self-pay | Admitting: Unknown Physician Specialty

## 2016-10-08 DIAGNOSIS — I10 Essential (primary) hypertension: Secondary | ICD-10-CM | POA: Diagnosis not present

## 2016-10-08 DIAGNOSIS — E119 Type 2 diabetes mellitus without complications: Secondary | ICD-10-CM | POA: Diagnosis not present

## 2016-10-08 DIAGNOSIS — Z6833 Body mass index (BMI) 33.0-33.9, adult: Secondary | ICD-10-CM | POA: Diagnosis not present

## 2016-11-18 DIAGNOSIS — E1165 Type 2 diabetes mellitus with hyperglycemia: Secondary | ICD-10-CM | POA: Diagnosis not present

## 2016-11-18 LAB — HM DIABETES EYE EXAM

## 2016-12-02 DIAGNOSIS — D225 Melanocytic nevi of trunk: Secondary | ICD-10-CM | POA: Diagnosis not present

## 2016-12-02 DIAGNOSIS — D2272 Melanocytic nevi of left lower limb, including hip: Secondary | ICD-10-CM | POA: Diagnosis not present

## 2016-12-02 DIAGNOSIS — L853 Xerosis cutis: Secondary | ICD-10-CM | POA: Diagnosis not present

## 2016-12-02 DIAGNOSIS — D2261 Melanocytic nevi of right upper limb, including shoulder: Secondary | ICD-10-CM | POA: Diagnosis not present

## 2016-12-13 DIAGNOSIS — H2513 Age-related nuclear cataract, bilateral: Secondary | ICD-10-CM | POA: Diagnosis not present

## 2016-12-28 MED ORDER — BUPIVACAINE-EPINEPHRINE (PF) 0.5% -1:200000 IJ SOLN
INTRAMUSCULAR | Status: AC
  Filled 2016-12-28: qty 30

## 2016-12-31 DIAGNOSIS — H2513 Age-related nuclear cataract, bilateral: Secondary | ICD-10-CM | POA: Diagnosis not present

## 2017-01-05 ENCOUNTER — Encounter: Payer: Self-pay | Admitting: *Deleted

## 2017-01-12 MED ORDER — MOXIFLOXACIN HCL 0.5 % OP SOLN: 1.0000 [drp] | OPHTHALMIC | Status: DC | PRN

## 2017-01-12 MED ORDER — ARMC OPHTHALMIC DILATING DROPS
1.0000 "application " | OPHTHALMIC | Status: AC
  Administered 2017-01-13: 1 via OPHTHALMIC

## 2017-01-13 ENCOUNTER — Ambulatory Visit: Payer: Medicare Other | Admitting: Anesthesiology

## 2017-01-13 ENCOUNTER — Encounter: Payer: Self-pay | Admitting: *Deleted

## 2017-01-13 ENCOUNTER — Encounter
Admission: RE | Disposition: A | Discharge status: Home / Self Care | Payer: Self-pay | Source: Ambulatory Visit | Attending: Ophthalmology

## 2017-01-13 ENCOUNTER — Ambulatory Visit
Admission: RE | Admit: 2017-01-13 | Discharge: 2017-01-13 | Disposition: A | Discharge status: Home / Self Care | Payer: Medicare Other | Source: Ambulatory Visit | Attending: Ophthalmology | Admitting: Ophthalmology

## 2017-01-13 DIAGNOSIS — H2511 Age-related nuclear cataract, right eye: Secondary | ICD-10-CM | POA: Diagnosis not present

## 2017-01-13 DIAGNOSIS — Z79899 Other long term (current) drug therapy: Secondary | ICD-10-CM | POA: Insufficient documentation

## 2017-01-13 DIAGNOSIS — G4730 Sleep apnea, unspecified: Secondary | ICD-10-CM | POA: Insufficient documentation

## 2017-01-13 DIAGNOSIS — M1990 Unspecified osteoarthritis, unspecified site: Secondary | ICD-10-CM | POA: Insufficient documentation

## 2017-01-13 DIAGNOSIS — J45909 Unspecified asthma, uncomplicated: Secondary | ICD-10-CM | POA: Insufficient documentation

## 2017-01-13 DIAGNOSIS — K219 Gastro-esophageal reflux disease without esophagitis: Secondary | ICD-10-CM | POA: Diagnosis not present

## 2017-01-13 DIAGNOSIS — I10 Essential (primary) hypertension: Secondary | ICD-10-CM | POA: Diagnosis not present

## 2017-01-13 DIAGNOSIS — E1136 Type 2 diabetes mellitus with diabetic cataract: Secondary | ICD-10-CM | POA: Diagnosis not present

## 2017-01-13 DIAGNOSIS — H2513 Age-related nuclear cataract, bilateral: Secondary | ICD-10-CM | POA: Diagnosis not present

## 2017-01-13 DIAGNOSIS — E119 Type 2 diabetes mellitus without complications: Secondary | ICD-10-CM | POA: Diagnosis not present

## 2017-01-13 HISTORY — PX: CATARACT EXTRACTION W/PHACO: SHX586

## 2017-01-13 HISTORY — DX: Essential (primary) hypertension: I10

## 2017-01-13 HISTORY — DX: Malignant (primary) neoplasm, unspecified: C80.1

## 2017-01-13 LAB — GLUCOSE, CAPILLARY: Glucose-Capillary: 188 mg/dL — ABNORMAL HIGH (ref 65–99)

## 2017-01-13 SURGERY — PHACOEMULSIFICATION, CATARACT, WITH IOL INSERTION
Anesthesia: Monitor Anesthesia Care | Site: Eye | Laterality: Right | Wound class: Clean

## 2017-01-13 MED ORDER — SODIUM CHLORIDE 0.9 % IV SOLN
INTRAVENOUS | Status: DC
  Administered 2017-01-13 (×2): via INTRAVENOUS

## 2017-01-13 MED ORDER — SODIUM HYALURONATE 10 MG/ML IO SOLN
INTRAOCULAR | Status: DC | PRN
  Administered 2017-01-13: 0.55 mL via INTRAOCULAR

## 2017-01-13 MED ORDER — POVIDONE-IODINE 5 % OP SOLN
OPHTHALMIC | Status: DC | PRN
  Administered 2017-01-13: 1 via OPHTHALMIC

## 2017-01-13 MED ORDER — ARMC OPHTHALMIC DILATING DROPS
OPHTHALMIC | Status: AC
  Administered 2017-01-13: 1 via OPHTHALMIC
  Filled 2017-01-13: qty 0.4

## 2017-01-13 MED ORDER — MIDAZOLAM HCL 2 MG/2ML IJ SOLN
INTRAMUSCULAR | Status: DC | PRN
  Administered 2017-01-13 (×2): 0.5 mg via INTRAVENOUS

## 2017-01-13 MED ORDER — EPINEPHRINE PF 1 MG/ML IJ SOLN
INTRAOCULAR | Status: DC | PRN
  Administered 2017-01-13: 08:00:00 via OPHTHALMIC

## 2017-01-13 MED ORDER — GLYCOPYRROLATE 0.2 MG/ML IJ SOLN
INTRAMUSCULAR | Status: AC
  Filled 2017-01-13: qty 1

## 2017-01-13 MED ORDER — LABETALOL HCL 5 MG/ML IV SOLN
INTRAVENOUS | Status: AC
  Filled 2017-01-13: qty 4

## 2017-01-13 MED ORDER — LIDOCAINE HCL (PF) 2 % IJ SOLN
INTRAMUSCULAR | Status: AC
Start: 2017-01-13 — End: 2017-01-13
  Filled 2017-01-13: qty 2

## 2017-01-13 MED ORDER — POVIDONE-IODINE 5 % OP SOLN
OPHTHALMIC | Status: AC
  Filled 2017-01-13: qty 30

## 2017-01-13 MED ORDER — ARMC OPHTHALMIC DILATING DROPS
1.0000 "application " | OPHTHALMIC | Status: DC
  Administered 2017-01-13 (×2): 1 via OPHTHALMIC

## 2017-01-13 MED ORDER — LIDOCAINE HCL (PF) 4 % IJ SOLN
INTRAOCULAR | Status: DC | PRN
  Administered 2017-01-13: 4 mL via OPHTHALMIC

## 2017-01-13 MED ORDER — MOXIFLOXACIN HCL 0.5 % OP SOLN
OPHTHALMIC | Status: AC
  Filled 2017-01-13: qty 3

## 2017-01-13 MED ORDER — SODIUM HYALURONATE 23 MG/ML IO SOLN
INTRAOCULAR | Status: DC | PRN
  Administered 2017-01-13: 0.6 mL via INTRAOCULAR

## 2017-01-13 MED ORDER — MIDAZOLAM HCL 2 MG/2ML IJ SOLN
INTRAMUSCULAR | Status: AC
  Filled 2017-01-13: qty 2

## 2017-01-13 MED ORDER — MOXIFLOXACIN HCL 0.5 % OP SOLN
OPHTHALMIC | Status: DC | PRN
  Administered 2017-01-13: 0.2 mL via OPHTHALMIC

## 2017-01-13 MED ORDER — PROPOFOL 10 MG/ML IV BOLUS
INTRAVENOUS | Status: AC
  Filled 2017-01-13: qty 20

## 2017-01-13 MED ORDER — ALFENTANIL 500 MCG/ML IJ INJ
INJECTION | INTRAVENOUS | Status: DC | PRN
  Administered 2017-01-13 (×2): 250 ug via INTRAVENOUS

## 2017-01-13 MED ORDER — EPINEPHRINE PF 1 MG/ML IJ SOLN
INTRAMUSCULAR | Status: AC
  Filled 2017-01-13: qty 2

## 2017-01-13 SURGICAL SUPPLY — 21 items
CANNULA ANT/CHMB 27G (MISCELLANEOUS) ×2 IMPLANT
CANNULA ANT/CHMB 27GA (MISCELLANEOUS) ×6 IMPLANT
CUP MEDICINE 2OZ PLAST GRAD ST (MISCELLANEOUS) ×3 IMPLANT
DISSECTOR HYDRO NUCLEUS 50X22 (MISCELLANEOUS) ×3 IMPLANT
GLOVE BIO SURGEON STRL SZ8 (GLOVE) ×3 IMPLANT
GLOVE BIOGEL M 6.5 STRL (GLOVE) ×3 IMPLANT
GLOVE SURG LX 7.5 STRW (GLOVE) ×2
GLOVE SURG LX STRL 7.5 STRW (GLOVE) ×1 IMPLANT
GOWN STRL REUS W/ TWL LRG LVL3 (GOWN DISPOSABLE) ×2 IMPLANT
GOWN STRL REUS W/TWL LRG LVL3 (GOWN DISPOSABLE) ×6
LENS IOL TECNIS ITEC 26.0 (Intraocular Lens) ×2 IMPLANT
PACK CATARACT (MISCELLANEOUS) ×3 IMPLANT
PACK CATARACT KING (MISCELLANEOUS) ×3 IMPLANT
PACK EYE AFTER SURG (MISCELLANEOUS) ×3 IMPLANT
SOL BSS BAG (MISCELLANEOUS) ×3
SOLUTION BSS BAG (MISCELLANEOUS) ×1 IMPLANT
SYR 3ML LL SCALE MARK (SYRINGE) ×6 IMPLANT
SYR 5ML LL (SYRINGE) ×3 IMPLANT
SYR TB 1ML 27GX1/2 LL (SYRINGE) ×3 IMPLANT
WATER STERILE IRR 250ML POUR (IV SOLUTION) ×3 IMPLANT
WIPE NON LINTING 3.25X3.25 (MISCELLANEOUS) ×3 IMPLANT

## 2017-01-13 NOTE — Anesthesia Post-op Follow-up Note (Cosign Needed)
Anesthesia QCDR form completed.        

## 2017-01-13 NOTE — Anesthesia Postprocedure Evaluation (Signed)
Anesthesia Post Note  Patient: Charlene Juarez  Procedure(s) Performed: Procedure(s) (LRB): CATARACT EXTRACTION PHACO AND INTRAOCULAR LENS PLACEMENT (IOC) (Right)  Patient location during evaluation: PACU Anesthesia Type: MAC Level of consciousness: awake and alert Pain management: pain level controlled Vital Signs Assessment: post-procedure vital signs reviewed and stable Respiratory status: spontaneous breathing, nonlabored ventilation, respiratory function stable and patient connected to nasal cannula oxygen Cardiovascular status: stable and blood pressure returned to baseline Anesthetic complications: no     Last Vitals:  Vitals:   01/13/17 0812 01/13/17 0815  BP: (!) 111/57 112/61  Pulse: 84 77  Resp: 16   Temp: 36.7 C     Last Pain:  Vitals:   01/13/17 0615  TempSrc: Oral                 Dominik Lauricella S

## 2017-01-13 NOTE — H&P (Signed)
The History and Physical notes are on paper, have been signed, and are to be scanned.   I have examined the patient and there are no changes to the H&P.   Benay Pillow 01/13/2017 7:28 AM

## 2017-01-13 NOTE — Anesthesia Preprocedure Evaluation (Signed)
Anesthesia Evaluation  Patient identified by MRN, date of birth, ID band Patient awake    Reviewed: Allergy & Precautions, NPO status , Patient's Chart, lab work & pertinent test results, reviewed documented beta blocker date and time   Airway Mallampati: II  TM Distance: >3 FB     Dental  (+) Chipped   Pulmonary asthma , sleep apnea ,           Cardiovascular hypertension, Pt. on medications      Neuro/Psych    GI/Hepatic GERD  Controlled,  Endo/Other  diabetes, Type 2  Renal/GU      Musculoskeletal  (+) Arthritis ,   Abdominal   Peds  Hematology   Anesthesia Other Findings   Reproductive/Obstetrics                             Anesthesia Physical Anesthesia Plan  ASA: III  Anesthesia Plan: MAC   Post-op Pain Management:    Induction:   Airway Management Planned:   Additional Equipment:   Intra-op Plan:   Post-operative Plan:   Informed Consent: I have reviewed the patients History and Physical, chart, labs and discussed the procedure including the risks, benefits and alternatives for the proposed anesthesia with the patient or authorized representative who has indicated his/her understanding and acceptance.     Plan Discussed with: CRNA  Anesthesia Plan Comments:         Anesthesia Quick Evaluation

## 2017-01-13 NOTE — Discharge Instructions (Signed)
Eye Surgery Discharge Instructions  Expect mild scratchy sensation or mild soreness. DO NOT RUB YOUR EYE!  The day of surgery:  Minimal physical activity, but bed rest is not required  No reading, computer work, or close hand work  No bending, lifting, or straining.  May watch TV  For 24 hours:  No driving, legal decisions, or alcoholic beverages  Safety precautions  Eat anything you prefer: It is better to start with liquids, then soup then solid foods.  _____ Eye patch should be worn until postoperative exam tomorrow.  ____ Solar shield eyeglasses should be worn for comfort in the sunlight/patch while sleeping  Resume all regular medications including aspirin or Coumadin if these were discontinued prior to surgery. You may shower, bathe, shave, or wash your hair. Tylenol may be taken for mild discomfort.  Call your doctor if you experience significant pain, nausea, or vomiting, fever > 101 or other signs of infection. [phone] or [phone] Specific instructions:  Follow-up Information    Benay Pillow, MD Follow up.   Specialty:  Ophthalmology Why:  April 27 at 10:10am Contact information: [address] [place] [address] [phone]

## 2017-01-13 NOTE — Op Note (Signed)
OPERATIVE NOTE  Charlene Juarez [id number] 01/13/2017   PREOPERATIVE DIAGNOSIS:  Nuclear sclerotic cataract right eye.  H25.11   POSTOPERATIVE DIAGNOSIS:    Nuclear sclerotic cataract right eye.     PROCEDURE:  Phacoemusification with posterior chamber intraocular lens placement of the right eye   LENS:   Implant Name Type Inv. Item Serial No. Manufacturer Lot No. LRB No. Used  LENS IOL DIOP 26.0 - [id number] Intraocular Lens LENS IOL DIOP 26.0 [id number] AMO   Right 1       PCB00 +26.0   ULTRASOUND TIME: 0 minutes 31 seconds.  CDE 1.78   SURGEON:  Benay Pillow, MD, MPH  ANESTHESIOLOGIST: Anesthesiologist: Gunnar Bulla, MD CRNA: Courtney Paris, CRNA   ANESTHESIA:  Topical with tetracaine drops augmented with 1% preservative-free intracameral lidocaine.  ESTIMATED BLOOD LOSS: less than 1 mL.   COMPLICATIONS:  None.   DESCRIPTION OF PROCEDURE:  The patient was identified in the holding room and transported to the operating room and placed in the supine position under the operating microscope.  The right eye was identified as the operative eye and it was prepped and draped in the usual sterile ophthalmic fashion.   A 1.0 millimeter clear-corneal paracentesis was made at the 10:30 position. 0.5 ml of preservative-free 1% lidocaine with epinephrine was injected into the anterior chamber.  The anterior chamber was filled with Healon 5 viscoelastic.  A 2.4 millimeter keratome was used to make a near-clear corneal incision at the 8:00 position.  A curvilinear capsulorrhexis was made with a cystotome and capsulorrhexis forceps.  Balanced salt solution was used to hydrodissect and hydrodelineate the nucleus.   Phacoemulsification was then used in stop and chop fashion to remove the lens nucleus and epinucleus.  The lens was relatively soft.  The remaining cortex was then removed using the irrigation and aspiration handpiece. Healon was then placed into the capsular bag to  distend it for lens placement.  A lens was then injected into the capsular bag.  The remaining viscoelastic was aspirated.   Wounds were hydrated with balanced salt solution.  The anterior chamber was inflated to a physiologic pressure with balanced salt solution.   Intracameral vigamox 0.1 mL undiluted was injected into the eye and a drop placed onto the ocular surface.  No wound leaks were noted.  The patient was taken to the recovery room in stable condition without complications of anesthesia or surgery  Benay Pillow 01/13/2017, 8:08 AM

## 2017-01-13 NOTE — Transfer of Care (Signed)
Immediate Anesthesia Transfer of Care Note  Patient: Quintana Canelo  Procedure(s) Performed: Procedure(s) with comments: CATARACT EXTRACTION PHACO AND INTRAOCULAR LENS PLACEMENT (IOC) (Right) - [place] 31.1 AP% 0.0 CDE 1.78 Fluid Pack lot # [id number]  Patient Location: PACU and Short Stay  Anesthesia Type:MAC  Level of Consciousness: awake, oriented and patient cooperative  Airway & Oxygen Therapy: Patient Spontanous Breathing  Post-op Assessment: Report given to RN and Post -op Vital signs reviewed and stable  Post vital signs: Reviewed and stable  Last Vitals:  Vitals:   01/13/17 0615  BP: 126/66  Pulse: 80  Resp: 16  Temp: 36.9 C    Last Pain:  Vitals:   01/13/17 0615  TempSrc: Oral         Complications: No apparent anesthesia complications

## 2017-02-15 DIAGNOSIS — H2512 Age-related nuclear cataract, left eye: Secondary | ICD-10-CM | POA: Diagnosis not present

## 2017-02-22 ENCOUNTER — Encounter: Payer: Self-pay | Admitting: *Deleted

## 2017-02-24 ENCOUNTER — Encounter: Payer: Self-pay | Admitting: *Deleted

## 2017-02-24 ENCOUNTER — Ambulatory Visit
Admission: RE | Admit: 2017-02-24 | Discharge: 2017-02-24 | Disposition: A | Discharge status: Home / Self Care | Payer: Medicare Other | Source: Ambulatory Visit | Attending: Ophthalmology | Admitting: Ophthalmology

## 2017-02-24 ENCOUNTER — Encounter
Admission: RE | Disposition: A | Discharge status: Home / Self Care | Payer: Self-pay | Source: Ambulatory Visit | Attending: Ophthalmology

## 2017-02-24 ENCOUNTER — Ambulatory Visit: Payer: Medicare Other | Admitting: Anesthesiology

## 2017-02-24 DIAGNOSIS — I10 Essential (primary) hypertension: Secondary | ICD-10-CM | POA: Insufficient documentation

## 2017-02-24 DIAGNOSIS — Z888 Allergy status to other drugs, medicaments and biological substances status: Secondary | ICD-10-CM | POA: Insufficient documentation

## 2017-02-24 DIAGNOSIS — Z882 Allergy status to sulfonamides status: Secondary | ICD-10-CM | POA: Diagnosis not present

## 2017-02-24 DIAGNOSIS — E119 Type 2 diabetes mellitus without complications: Secondary | ICD-10-CM | POA: Insufficient documentation

## 2017-02-24 DIAGNOSIS — G4730 Sleep apnea, unspecified: Secondary | ICD-10-CM | POA: Diagnosis not present

## 2017-02-24 DIAGNOSIS — Z885 Allergy status to narcotic agent status: Secondary | ICD-10-CM | POA: Insufficient documentation

## 2017-02-24 DIAGNOSIS — H2512 Age-related nuclear cataract, left eye: Secondary | ICD-10-CM | POA: Diagnosis not present

## 2017-02-24 DIAGNOSIS — Z7984 Long term (current) use of oral hypoglycemic drugs: Secondary | ICD-10-CM | POA: Insufficient documentation

## 2017-02-24 DIAGNOSIS — E7800 Pure hypercholesterolemia, unspecified: Secondary | ICD-10-CM | POA: Diagnosis not present

## 2017-02-24 DIAGNOSIS — C44301 Unspecified malignant neoplasm of skin of nose: Secondary | ICD-10-CM | POA: Diagnosis not present

## 2017-02-24 DIAGNOSIS — J45909 Unspecified asthma, uncomplicated: Secondary | ICD-10-CM | POA: Diagnosis not present

## 2017-02-24 HISTORY — PX: CATARACT EXTRACTION W/PHACO: SHX586

## 2017-02-24 LAB — GLUCOSE, CAPILLARY: GLUCOSE-CAPILLARY: 243 mg/dL — AB (ref 65–99)

## 2017-02-24 SURGERY — PHACOEMULSIFICATION, CATARACT, WITH IOL INSERTION
Anesthesia: Monitor Anesthesia Care | Laterality: Left

## 2017-02-24 MED ORDER — MIDAZOLAM HCL 2 MG/2ML IJ SOLN
INTRAMUSCULAR | Status: DC | PRN
  Administered 2017-02-24: 1 mg via INTRAVENOUS

## 2017-02-24 MED ORDER — SODIUM HYALURONATE 23 MG/ML IO SOLN
INTRAOCULAR | Status: AC
  Filled 2017-02-24: qty 0.6

## 2017-02-24 MED ORDER — EPINEPHRINE PF 1 MG/ML IJ SOLN
INTRAMUSCULAR | Status: AC
  Filled 2017-02-24: qty 1

## 2017-02-24 MED ORDER — MOXIFLOXACIN HCL 0.5 % OP SOLN
OPHTHALMIC | Status: DC | PRN
  Administered 2017-02-24: 1 [drp] via OPHTHALMIC

## 2017-02-24 MED ORDER — FENTANYL CITRATE (PF) 100 MCG/2ML IJ SOLN
INTRAMUSCULAR | Status: DC | PRN
Start: 2017-02-24 — End: 2017-02-24
  Administered 2017-02-24: 50 ug via INTRAVENOUS

## 2017-02-24 MED ORDER — POVIDONE-IODINE 5 % OP SOLN
OPHTHALMIC | Status: AC
  Filled 2017-02-24: qty 30

## 2017-02-24 MED ORDER — MIDAZOLAM HCL 2 MG/2ML IJ SOLN
INTRAMUSCULAR | Status: AC
  Filled 2017-02-24: qty 2

## 2017-02-24 MED ORDER — SODIUM HYALURONATE 10 MG/ML IO SOLN
INTRAOCULAR | Status: DC | PRN
  Administered 2017-02-24: 0.55 mL via INTRAOCULAR

## 2017-02-24 MED ORDER — POVIDONE-IODINE 5 % OP SOLN
OPHTHALMIC | Status: DC | PRN
  Administered 2017-02-24: 1 via OPHTHALMIC

## 2017-02-24 MED ORDER — TETRACAINE 0.5 % OP SOLN OPTIME - NO CHARGE
OPHTHALMIC | Status: DC | PRN
  Administered 2017-02-24: 2 [drp]

## 2017-02-24 MED ORDER — LIDOCAINE HCL (PF) 4 % IJ SOLN
INTRAOCULAR | Status: DC | PRN
  Administered 2017-02-24: 1 mL via OPHTHALMIC

## 2017-02-24 MED ORDER — MOXIFLOXACIN HCL 0.5 % OP SOLN: 1.0000 [drp] | OPHTHALMIC | Status: DC | PRN

## 2017-02-24 MED ORDER — ARMC OPHTHALMIC DILATING DROPS
1.0000 "application " | OPHTHALMIC | Status: AC
  Administered 2017-02-24 (×3): 1 via OPHTHALMIC

## 2017-02-24 MED ORDER — SODIUM HYALURONATE 23 MG/ML IO SOLN
INTRAOCULAR | Status: DC | PRN
  Administered 2017-02-24: 0.6 mL via INTRAOCULAR

## 2017-02-24 MED ORDER — NA CHONDROIT SULF-NA HYALURON 40-30 MG/ML IO SOLN
INTRAOCULAR | Status: AC
  Filled 2017-02-24: qty 0.5

## 2017-02-24 MED ORDER — SODIUM CHLORIDE 0.9 % IV SOLN
INTRAVENOUS | Status: DC
  Administered 2017-02-24: 07:00:00 via INTRAVENOUS

## 2017-02-24 MED ORDER — TRYPAN BLUE 0.06 % OP SOLN
OPHTHALMIC | Status: AC
  Filled 2017-02-24: qty 0.5

## 2017-02-24 MED ORDER — SODIUM HYALURONATE 10 MG/ML IO SOLN
INTRAOCULAR | Status: AC
  Filled 2017-02-24: qty 0.85

## 2017-02-24 MED ORDER — FENTANYL CITRATE (PF) 100 MCG/2ML IJ SOLN
INTRAMUSCULAR | Status: AC
  Filled 2017-02-24: qty 2

## 2017-02-24 MED ORDER — BSS IO SOLN
INTRAOCULAR | Status: DC | PRN
  Administered 2017-02-24: 1 via INTRAOCULAR

## 2017-02-24 SURGICAL SUPPLY — 17 items
DISSECTOR HYDRO NUCLEUS 50X22 (MISCELLANEOUS) ×3 IMPLANT
GLOVE BIO SURGEON STRL SZ8 (GLOVE) ×3 IMPLANT
GLOVE BIOGEL M 6.5 STRL (GLOVE) ×3 IMPLANT
GLOVE SURG LX 7.5 STRW (GLOVE) ×2
GLOVE SURG LX STRL 7.5 STRW (GLOVE) ×1 IMPLANT
GOWN STRL REUS W/ TWL LRG LVL3 (GOWN DISPOSABLE) ×2 IMPLANT
GOWN STRL REUS W/TWL LRG LVL3 (GOWN DISPOSABLE) ×6
LENS IOL TECNIS ITEC 26.0 (Intraocular Lens) ×2 IMPLANT
PACK CATARACT (MISCELLANEOUS) ×3 IMPLANT
PACK CATARACT KING (MISCELLANEOUS) ×3 IMPLANT
PACK EYE AFTER SURG (MISCELLANEOUS) ×3 IMPLANT
SOL BAL SALT 15ML (MISCELLANEOUS) ×3
SOL BSS BAG (MISCELLANEOUS) ×3
SOLUTION BAL SALT 15ML (MISCELLANEOUS) IMPLANT
SOLUTION BSS BAG (MISCELLANEOUS) ×1 IMPLANT
WATER STERILE IRR 250ML POUR (IV SOLUTION) ×3 IMPLANT
WIPE NON LINTING 3.25X3.25 (MISCELLANEOUS) ×3 IMPLANT

## 2017-02-24 NOTE — Anesthesia Preprocedure Evaluation (Signed)
Anesthesia Evaluation  Patient identified by MRN, date of birth, ID band Patient awake    Reviewed: Allergy & Precautions, NPO status , Patient's Chart, lab work & pertinent test results  History of Anesthesia Complications Negative for: history of anesthetic complications  Airway Mallampati: III       Dental  (+) Lower Dentures, Upper Dentures   Pulmonary asthma , sleep apnea (pt denies) ,           Cardiovascular hypertension, Pt. on medications      Neuro/Psych negative neurological ROS     GI/Hepatic Neg liver ROS, GERD  Medicated and Controlled,  Endo/Other  diabetes, Type 2, Oral Hypoglycemic Agents  Renal/GU negative Renal ROS     Musculoskeletal   Abdominal   Peds  Hematology   Anesthesia Other Findings   Reproductive/Obstetrics                             Anesthesia Physical Anesthesia Plan  ASA: III  Anesthesia Plan: MAC   Post-op Pain Management:    Induction: Intravenous  PONV Risk Score and Plan: 2 and Ondansetron, Dexamethasone and Treatment may vary due to age  Airway Management Planned:   Additional Equipment:   Intra-op Plan:   Post-operative Plan:   Informed Consent: I have reviewed the patients History and Physical, chart, labs and discussed the procedure including the risks, benefits and alternatives for the proposed anesthesia with the patient or authorized representative who has indicated his/her understanding and acceptance.     Plan Discussed with:   Anesthesia Plan Comments:         Anesthesia Quick Evaluation

## 2017-02-24 NOTE — Transfer of Care (Signed)
Immediate Anesthesia Transfer of Care Note  Patient: Charlene Juarez  Procedure(s) Performed: Procedure(s) with comments: CATARACT EXTRACTION PHACO AND INTRAOCULAR LENS PLACEMENT (IOC) (Left) - [place]  00:45.5 AP  0.7 CDE   2.76  fluid pack lot # [id number]  exp. 08-19-2018  Patient Location: PACU  Anesthesia Type:MAC  Level of Consciousness: awake, alert  and oriented  Airway & Oxygen Therapy: Patient Spontanous Breathing  Post-op Assessment: Report given to RN and Post -op Vital signs reviewed and stable  Post vital signs: Reviewed and stable  Last Vitals:  Vitals:   02/24/17 0618 02/24/17 0835  BP: 126/72 113/75  Pulse: 79 74  Resp: 20 13  Temp: 36.5 C     Last Pain:  Vitals:   02/24/17 0618  TempSrc: Oral         Complications: No apparent anesthesia complications

## 2017-02-24 NOTE — Discharge Instructions (Signed)
FOLLOW DR. Melony Overly POSTOP DRUG REMINDER/CATARACT SURGERY INSTRUCTION SHEET AS REVIEWED.   Eye Surgery Discharge Instructions  Expect mild scratchy sensation or mild soreness. DO NOT RUB YOUR EYE!  The day of surgery:  Minimal physical activity, but bed rest is not required  No reading, computer work, or close hand work  No bending, lifting, or straining.  May watch TV  For 24 hours:  No driving, legal decisions, or alcoholic beverages  Safety precautions  Eat anything you prefer: It is better to start with liquids, then soup then solid foods.  _____ Eye patch should be worn until postoperative exam tomorrow.  ____ Solar shield eyeglasses should be worn for comfort in the sunlight/patch while sleeping  Resume all regular medications including aspirin or Coumadin if these were discontinued prior to surgery. You may shower, bathe, shave, or wash your hair. Tylenol may be taken for mild discomfort.  Call your doctor if you experience significant pain, nausea, or vomiting, fever > 101 or other signs of infection. [phone] or [phone] Specific instructions:  Follow-up Information    Eulogio Bear, MD Follow up.   Specialty:  Ophthalmology Why:  Friday February 25, 2017 @ 10:15 am Contact information: [address] [place] [address] [phone]

## 2017-02-24 NOTE — Anesthesia Post-op Follow-up Note (Cosign Needed)
Anesthesia QCDR form completed.        

## 2017-02-24 NOTE — Op Note (Signed)
OPERATIVE NOTE  Charlene Juarez [id number] 02/24/2017   PREOPERATIVE DIAGNOSIS:  Nuclear sclerotic cataract left eye.  H25.12   POSTOPERATIVE DIAGNOSIS:    Nuclear sclerotic cataract left eye.     PROCEDURE:  Phacoemusification with posterior chamber intraocular lens placement of the left eye   LENS:   Implant Name Type Inv. Item Serial No. Manufacturer Lot No. LRB No. Used  IOL     [id number]       1       PCB00 +26.0   ULTRASOUND TIME: 0 minutes 45 seconds.  CDE 2.76   SURGEON:  Benay Pillow, MD, MPH   ANESTHESIA:  Topical with tetracaine drops augmented with 1% preservative-free intracameral lidocaine.  ESTIMATED BLOOD LOSS: <1 mL   COMPLICATIONS:  None.   DESCRIPTION OF PROCEDURE:  The patient was identified in the holding room and transported to the operating room and placed in the supine position under the operating microscope.  The left eye was identified as the operative eye and it was prepped and draped in the usual sterile ophthalmic fashion.   A 1.0 millimeter clear-corneal paracentesis was made at the 5:00 position. 0.5 ml of preservative-free 1% lidocaine with epinephrine was injected into the anterior chamber.  The anterior chamber was filled with Healon 5 viscoelastic.  A 2.4 millimeter keratome was used to make a near-clear corneal incision at the 2:00 position.  A curvilinear capsulorrhexis was made with a cystotome and capsulorrhexis forceps.  Balanced salt solution was used to hydrodissect and hydrodelineate the nucleus.   Phacoemulsification was then used in stop and chop fashion to remove the lens nucleus and epinucleus.  The remaining cortex was then removed using the irrigation and aspiration handpiece. Healon was then placed into the capsular bag to distend it for lens placement.  A lens was then injected into the capsular bag.  The remaining viscoelastic was aspirated.   Wounds were hydrated with balanced salt solution.  The anterior chamber was  inflated to a physiologic pressure with balanced salt solution.  Intracameral vigamox 0.1 mL undiltued was injected into the eye and a drop placed onto the ocular surface.  No wound leaks were noted.  The patient was taken to the recovery room in stable condition without complications of anesthesia or surgery  Benay Pillow 02/24/2017, 8:32 AM

## 2017-02-24 NOTE — Anesthesia Postprocedure Evaluation (Signed)
Anesthesia Post Note  Patient: Charlene Juarez  Procedure(s) Performed: Procedure(s) (LRB): CATARACT EXTRACTION PHACO AND INTRAOCULAR LENS PLACEMENT (IOC) (Left)  Patient location during evaluation: PACU Anesthesia Type: MAC Level of consciousness: awake, oriented and awake and alert Pain management: pain level controlled Vital Signs Assessment: post-procedure vital signs reviewed and stable Respiratory status: spontaneous breathing, nonlabored ventilation and respiratory function stable Cardiovascular status: stable Anesthetic complications: no     Last Vitals:  Vitals:   02/24/17 0618 02/24/17 0835  BP: 126/72 113/75  Pulse: 79 74  Resp: 20 13  Temp: 36.5 C     Last Pain:  Vitals:   02/24/17 0618  TempSrc: Oral                 Lance Muss

## 2017-02-24 NOTE — H&P (Signed)
The History and Physical notes are on paper, have been signed, and are to be scanned.   I have examined the patient and there are no changes to the H&P.   Benay Pillow 02/24/2017 7:50 AM

## 2017-02-24 NOTE — Anesthesia Procedure Notes (Signed)
Procedure Name: MAC Performed by: Lance Muss Pre-anesthesia Checklist: Patient identified, Emergency Drugs available, Suction available, Patient being monitored and Timeout performed Oxygen Delivery Method: Nasal cannula Preoxygenation: Pre-oxygenation with 100% oxygen

## 2017-04-19 ENCOUNTER — Encounter: Payer: Self-pay | Admitting: Unknown Physician Specialty

## 2017-04-19 ENCOUNTER — Ambulatory Visit (INDEPENDENT_AMBULATORY_CARE_PROVIDER_SITE_OTHER): Payer: Medicare Other | Admitting: Unknown Physician Specialty

## 2017-04-19 VITALS — BP 127/76 | HR 77 | Temp 97.9°F | Ht 62.2 in | Wt 185.2 lb

## 2017-04-19 DIAGNOSIS — I10 Essential (primary) hypertension: Secondary | ICD-10-CM | POA: Diagnosis not present

## 2017-04-19 DIAGNOSIS — E7800 Pure hypercholesterolemia, unspecified: Secondary | ICD-10-CM | POA: Diagnosis not present

## 2017-04-19 DIAGNOSIS — E119 Type 2 diabetes mellitus without complications: Secondary | ICD-10-CM | POA: Diagnosis not present

## 2017-04-19 MED ORDER — SEMAGLUTIDE(0.25 OR 0.5MG/DOS) 2 MG/1.5ML ~~LOC~~ SOPN: 0.2500 mg | PEN_INJECTOR | SUBCUTANEOUS | Status: DC

## 2017-04-19 MED ORDER — METFORMIN HCL 1000 MG PO TABS: 1000.0000 mg | ORAL_TABLET | Freq: Two times a day (BID) | ORAL | 3 refills | Status: DC

## 2017-04-19 NOTE — Assessment & Plan Note (Signed)
Stable, continue present medications.   

## 2017-04-19 NOTE — Progress Notes (Addendum)
BP 127/76   Pulse 77   Temp 97.9 F (36.6 C)   Ht 5' 2.2" (1.58 m)   Wt 185 lb 3.2 oz (84 kg)   LMP 07/13/2000 (Approximate)   SpO2 98%   BMI 33.66 kg/m    Subjective:    Patient ID: [name], female    DOB: 05/11/1951, 66 y.o.   MRN: [id number]  HPI: Corlene Sabia is a 66 y.o. female  Chief Complaint  Patient presents with  . Diabetes    pt states she had recent eye exam, will fax form to [facility]   . Hyperlipidemia  . Hypertension  . Labs Only    pt states she is interested in Hep C and HIV labs    Lost to f/u here.  She has been going to [facility].  Reviewed last note from family doctor.    Diabetes: Blood sugars are high and doesn't feel her blood sugar medications are working for her Using medications without difficulties No hypoglycemic episodes No hyperglycemic episodes Feet problems: none Blood Sugars averaging: States they have been high eye exam within last year Last Hgb A1C: 8.7  Hypertension  Using medications without difficulty Average home BPs Not chekcing   Using medication without problems or lightheadedness No chest pain with exertion or shortness of breath No Edema  Elevated Cholesterol Using medications without problems No Muscle aches  Diet: Does not eat a lot of sugar.  Has been to diabetes education in the past Exercise: around the house   Relevant past medical, surgical, family and social history reviewed and updated as indicated. Interim medical history since our last visit reviewed. Allergies and medications reviewed and updated.  Review of Systems  Constitutional: Negative.   HENT: Negative.   Respiratory: Negative.   Genitourinary: Negative.   Psychiatric/Behavioral: Negative.     Per HPI unless specifically indicated above     Objective:    BP 127/76   Pulse 77   Temp 97.9 F (36.6 C)   Ht 5' 2.2" (1.58 m)   Wt 185 lb 3.2 oz (84 kg)   LMP 07/13/2000 (Approximate)   SpO2 98%   BMI 33.66  kg/m   Wt Readings from Last 3 Encounters:  04/19/17 185 lb 3.2 oz (84 kg)  02/22/17 182 lb (82.6 kg)  01/13/17 179 lb (81.2 kg)    Physical Exam  Constitutional: She is oriented to person, place, and time. She appears well-developed and well-nourished. No distress.  HENT:  Head: Normocephalic and atraumatic.  Eyes: Conjunctivae and lids are normal. Right eye exhibits no discharge. Left eye exhibits no discharge. No scleral icterus.  Neck: Normal range of motion. Neck supple. No JVD present. Carotid bruit is not present.  Cardiovascular: Normal rate, regular rhythm and normal heart sounds.   Pulmonary/Chest: Effort normal and breath sounds normal.  Abdominal: Normal appearance. There is no splenomegaly or hepatomegaly.  Musculoskeletal: Normal range of motion.  Neurological: She is alert and oriented to person, place, and time.  Skin: Skin is warm, dry and intact. No rash noted. No pallor.  Psychiatric: She has a normal mood and affect. Her behavior is normal. Judgment and thought content normal.    Results for orders placed or performed during the hospital encounter of 02/24/17  Glucose, capillary  Result Value Ref Range   Glucose-Capillary 243 (H) 65 - 99 mg/dL      Assessment & Plan:   Problem List Items Addressed This Visit  Unprioritized   Diabetes ([place]) - Primary    Hgb AiC is 9.5%.  Start Ozempic .25 mg weekly for 4 weeks.  Then .5 mg weekly.  DC Januvia.        Relevant Medications   metFORMIN (GLUCOPHAGE) 1000 MG tablet   Semaglutide (OZEMPIC) 0.25 or 0.5 MG/DOSE SOPN   Other Relevant Orders   Lipid Panel w/o Chol/HDL Ratio   Microalbumin, Urine Waived   Bayer DCA Hb A1c Waived   Hyperlipidemia    Check Lipid panel      Hypertension    Stable, continue present medications.        Relevant Orders   Comprehensive metabolic panel       Follow up plan: Return in about 4 weeks (around 05/17/2017).

## 2017-04-19 NOTE — Assessment & Plan Note (Addendum)
Hgb AiC is 9.5%.  Start Ozempic .25 mg weekly for 4 weeks.  Then .5 mg weekly.  DC Januvia.

## 2017-04-19 NOTE — Assessment & Plan Note (Signed)
Check Lipid panel 

## 2017-04-20 ENCOUNTER — Encounter: Payer: Self-pay | Admitting: Unknown Physician Specialty

## 2017-04-20 LAB — COMPREHENSIVE METABOLIC PANEL
A/G RATIO: 1.8 (ref 1.2–2.2)
ALT: 52 IU/L — AB (ref 0–32)
AST: 31 IU/L (ref 0–40)
Albumin: 4.4 g/dL (ref 3.6–4.8)
Alkaline Phosphatase: 59 IU/L (ref 39–117)
BUN/Creatinine Ratio: 16 (ref 12–28)
BUN: 12 mg/dL (ref 8–27)
Bilirubin Total: 0.3 mg/dL (ref 0.0–1.2)
CALCIUM: 9.6 mg/dL (ref 8.7–10.3)
CO2: 25 mmol/L (ref 20–29)
Chloride: 100 mmol/L (ref 96–106)
Creatinine, Ser: 0.75 mg/dL (ref 0.57–1.00)
GFR calc Af Amer: 97 mL/min/{1.73_m2} (ref >59)
GFR, EST NON AFRICAN AMERICAN: 84 mL/min/{1.73_m2} (ref >59)
Globulin, Total: 2.4 g/dL (ref 1.5–4.5)
Glucose: 227 mg/dL — ABNORMAL HIGH (ref 65–99)
POTASSIUM: 4 mmol/L (ref 3.5–5.2)
Sodium: 140 mmol/L (ref 134–144)
Total Protein: 6.8 g/dL (ref 6.0–8.5)

## 2017-04-20 LAB — LIPID PANEL W/O CHOL/HDL RATIO
CHOLESTEROL TOTAL: 144 mg/dL (ref 100–199)
HDL: 38 mg/dL — ABNORMAL LOW (ref >39)
LDL CALC: 46 mg/dL (ref 0–99)
Triglycerides: 300 mg/dL — ABNORMAL HIGH (ref 0–149)
VLDL CHOLESTEROL CAL: 60 mg/dL — AB (ref 5–40)

## 2017-04-25 LAB — MICROALBUMIN, URINE WAIVED
CREATININE, URINE WAIVED: 100 mg/dL (ref 10–300)
Microalb, Ur Waived: 10 mg/L (ref 0–19)

## 2017-04-25 LAB — BAYER DCA HB A1C WAIVED: HB A1C (BAYER DCA - WAIVED): 9.5 % — ABNORMAL HIGH (ref <7.0)

## 2017-04-27 ENCOUNTER — Other Ambulatory Visit: Payer: Self-pay | Admitting: Unknown Physician Specialty

## 2017-04-27 DIAGNOSIS — Z1231 Encounter for screening mammogram for malignant neoplasm of breast: Secondary | ICD-10-CM

## 2017-05-11 ENCOUNTER — Ambulatory Visit
Admission: RE | Admit: 2017-05-11 | Discharge: 2017-05-11 | Disposition: A | Discharge status: Home / Self Care | Payer: Medicare Other | Source: Ambulatory Visit | Attending: Unknown Physician Specialty | Admitting: Unknown Physician Specialty

## 2017-05-11 DIAGNOSIS — Z1231 Encounter for screening mammogram for malignant neoplasm of breast: Secondary | ICD-10-CM

## 2017-05-13 ENCOUNTER — Ambulatory Visit (INDEPENDENT_AMBULATORY_CARE_PROVIDER_SITE_OTHER): Payer: Medicare Other | Admitting: Unknown Physician Specialty

## 2017-05-13 ENCOUNTER — Encounter: Payer: Self-pay | Admitting: Unknown Physician Specialty

## 2017-05-13 DIAGNOSIS — E119 Type 2 diabetes mellitus without complications: Secondary | ICD-10-CM

## 2017-05-13 NOTE — Assessment & Plan Note (Signed)
Continue Ozempic at .5 mg

## 2017-05-13 NOTE — Progress Notes (Signed)
   BP 129/62   Pulse 74   Temp 98.2 F (36.8 C)   Wt 180 lb 6.4 oz (81.8 kg)   LMP 07/13/2000 (Approximate)   SpO2 98%   BMI 32.78 kg/m    Subjective:    Patient ID: Charlene Juarez, female    DOB: Dec 01, 1950, 66 y.o.   MRN: [id number]  HPI: [name] is a 66 y.o. female  Chief Complaint  Patient presents with  . Diabetes    4 week f/up    Here today to f/u very high Hgb A1C.    Started Ozempic last visit and tolerating well.  On .5 mg and states sugars are better and not as hungry.  Lost 5 pounds without dieting.    Diabetes: Using medications without difficulties. No hypoglycemic episodes No hyperglycemic episodes Feet problems: none Blood Sugars averaging:160-180 in the afternoon eye exam within last year Last Hgb A1C: 9.5%   Relevant past medical, surgical, family and social history reviewed and updated as indicated. Interim medical history since our last visit reviewed. Allergies and medications reviewed and updated.  Review of Systems  Per HPI unless specifically indicated above     Objective:    BP 129/62   Pulse 74   Temp 98.2 F (36.8 C)   Wt 180 lb 6.4 oz (81.8 kg)   LMP 07/13/2000 (Approximate)   SpO2 98%   BMI 32.78 kg/m   Wt Readings from Last 3 Encounters:  05/13/17 180 lb 6.4 oz (81.8 kg)  04/19/17 185 lb 3.2 oz (84 kg)  02/22/17 182 lb (82.6 kg)    Physical Exam  Constitutional: She is oriented to person, place, and time. She appears well-developed and well-nourished. No distress.  HENT:  Head: Normocephalic and atraumatic.  Eyes: Conjunctivae and lids are normal. Right eye exhibits no discharge. Left eye exhibits no discharge. No scleral icterus.  Neck: Normal range of motion. Neck supple. No JVD present. Carotid bruit is not present.  Cardiovascular: Normal rate, regular rhythm and normal heart sounds.   Pulmonary/Chest: Effort normal and breath sounds normal.  Abdominal: Normal appearance. There is no  splenomegaly or hepatomegaly.  Musculoskeletal: Normal range of motion.  Neurological: She is alert and oriented to person, place, and time.  Skin: Skin is warm, dry and intact. No rash noted. No pallor.  Psychiatric: She has a normal mood and affect. Her behavior is normal. Judgment and thought content normal.    Results for orders placed or performed in visit on 04/22/17  HM DIABETES EYE EXAM  Result Value Ref Range   HM Diabetic Eye Exam No Retinopathy No Retinopathy      Assessment & Plan:   Problem List Items Addressed This Visit      Unprioritized   Diabetes ([place])    Continue Ozempic at .5 mg          Follow up plan: Return in about 2 months (around 07/13/2017).

## 2017-06-28 ENCOUNTER — Telehealth: Payer: Self-pay | Admitting: Unknown Physician Specialty

## 2017-06-28 MED ORDER — SEMAGLUTIDE (1 MG/DOSE) 2 MG/1.5ML ~~LOC~~ SOPN: 1.0000 mg | PEN_INJECTOR | SUBCUTANEOUS | 12 refills | Status: DC

## 2017-06-28 NOTE — Telephone Encounter (Signed)
Routing to provider  

## 2017-06-28 NOTE — Telephone Encounter (Signed)
Called and let patient know that her prescription was sent in for her.

## 2017-06-28 NOTE — Telephone Encounter (Signed)
Patient has been in [place] with her daughter and just flew back last night. She left her Ozempic injections there and is hoping Malachy Mood will be able to send [facility] [place] and she is also wanting her to go up to the next dosage that she and Malachy Mood discussed at the last visit.  [facility] [phone]  Thanks

## 2017-07-15 ENCOUNTER — Encounter: Payer: Self-pay | Admitting: Unknown Physician Specialty

## 2017-07-15 ENCOUNTER — Ambulatory Visit (INDEPENDENT_AMBULATORY_CARE_PROVIDER_SITE_OTHER): Payer: Medicare Other | Admitting: Unknown Physician Specialty

## 2017-07-15 VITALS — BP 115/69 | HR 80 | Temp 97.6°F | Wt 174.2 lb

## 2017-07-15 DIAGNOSIS — E7800 Pure hypercholesterolemia, unspecified: Secondary | ICD-10-CM

## 2017-07-15 DIAGNOSIS — Z23 Encounter for immunization: Secondary | ICD-10-CM

## 2017-07-15 DIAGNOSIS — E119 Type 2 diabetes mellitus without complications: Secondary | ICD-10-CM

## 2017-07-15 DIAGNOSIS — L719 Rosacea, unspecified: Secondary | ICD-10-CM | POA: Diagnosis not present

## 2017-07-15 DIAGNOSIS — I10 Essential (primary) hypertension: Secondary | ICD-10-CM | POA: Diagnosis not present

## 2017-07-15 LAB — BAYER DCA HB A1C WAIVED: HB A1C: 7.6 % — AB (ref <7.0)

## 2017-07-15 MED ORDER — SOOLANTRA 1 % EX CREA: 1.0000 "application " | TOPICAL_CREAM | Freq: Every day | CUTANEOUS | 5 refills | Status: DC | PRN

## 2017-07-15 NOTE — Assessment & Plan Note (Signed)
Hgb A1C is down to 7.6%  Dramatic decrease despite Ozempic for only about a month.  Recheck in 3 months

## 2017-07-15 NOTE — Assessment & Plan Note (Signed)
Stable, continue present medications.   

## 2017-07-15 NOTE — Progress Notes (Signed)
BP 115/69   Pulse 80   Temp 97.6 F (36.4 C)   Wt 174 lb 3.2 oz (79 kg)   LMP 07/13/2000 (Approximate)   SpO2 96%   BMI 31.66 kg/m    Subjective:    Patient ID: Charlene Juarez, female    DOB: June 12, 1951, 66 y.o.   MRN: [id number]  HPI: Charlene Juarez is a 66 y.o. female  Chief Complaint  Patient presents with  . Diabetes  . Hyperlipidemia  . Hypertension   Diabetes: Added Ozempic but did not take until a little over a month ago.  States she is on the 1 mg.   Using medications without difficulties No hypoglycemic episodes No hyperglycemic episodes Feet problems: none Blood Sugars averaging:one day it was 110 eye exam within last year Last Hgb A1C: 9.5  Hypertension  Using medications without difficulty Average home BPs They have been goood   Using medication without problems or lightheadedness No chest pain with exertion or shortness of breath No Edema  Elevated Cholesterol Using medications without problems No Muscle aches  Diet: Exercise:Gets sick if overeats.    Rosacea Takes Soolantra for Rosacea prescribed by dermatology.  Red spots come and go  Relevant past medical, surgical, family and social history reviewed and updated as indicated. Interim medical history since our last visit reviewed. Allergies and medications reviewed and updated.  Review of Systems  Per HPI unless specifically indicated above     Objective:    BP 115/69   Pulse 80   Temp 97.6 F (36.4 C)   Wt 174 lb 3.2 oz (79 kg)   LMP 07/13/2000 (Approximate)   SpO2 96%   BMI 31.66 kg/m   Wt Readings from Last 3 Encounters:  07/15/17 174 lb 3.2 oz (79 kg)  05/13/17 180 lb 6.4 oz (81.8 kg)  04/19/17 185 lb 3.2 oz (84 kg)    Physical Exam  Constitutional: She is oriented to person, place, and time. She appears well-developed and well-nourished. No distress.  HENT:  Head: Normocephalic and atraumatic.  Eyes: Conjunctivae and lids are normal. Right eye  exhibits no discharge. Left eye exhibits no discharge. No scleral icterus.  Neck: Normal range of motion. Neck supple. No JVD present. Carotid bruit is not present.  Cardiovascular: Normal rate, regular rhythm and normal heart sounds.   Pulmonary/Chest: Effort normal and breath sounds normal.  Abdominal: Normal appearance. There is no splenomegaly or hepatomegaly.  Musculoskeletal: Normal range of motion.  Neurological: She is alert and oriented to person, place, and time.  Skin: Skin is warm, dry and intact. No rash noted. No pallor.  Psychiatric: She has a normal mood and affect. Her behavior is normal. Judgment and thought content normal.    Results for orders placed or performed in visit on 04/22/17  HM DIABETES EYE EXAM  Result Value Ref Range   HM Diabetic Eye Exam No Retinopathy No Retinopathy      Assessment & Plan:   Problem List Items Addressed This Visit      Unprioritized   Diabetes ([place])    Hgb A1C is down to 7.6%  Dramatic decrease despite Ozempic for only about a month.  Recheck in 3 months      Relevant Orders   Bayer DCA Hb A1c Waived   Comprehensive metabolic panel   Hyperlipidemia    Stable, continue present medications.        Hypertension    Stable, continue present medications.  Rosacea    Prescribed by dermatology originally.  It seems she has not tried other products and understands it may not be covered.  Admits to improvement on Ozimpic       Other Visit Diagnoses    Need for influenza vaccination    -  Primary   Relevant Orders   Flu vaccine HIGH DOSE PF (Completed)   Need for pneumococcal vaccination       Relevant Orders   Pneumococcal conjugate vaccine 13-valent IM (Completed)       Follow up plan: Return in about 3 months (around 10/15/2017).

## 2017-07-15 NOTE — Assessment & Plan Note (Signed)
Prescribed by dermatology originally.  It seems she has not tried other products and understands it may not be covered.  Admits to improvement on Ozimpic

## 2017-07-15 NOTE — Patient Instructions (Signed)
Pneumococcal Conjugate Vaccine (PCV13) What You Need to Know 1. Why get vaccinated? Vaccination can protect both children and adults from pneumococcal disease. Pneumococcal disease is caused by bacteria that can spread from person to person through close contact. It can cause ear infections, and it can also lead to more serious infections of the:  Lungs (pneumonia),  Blood (bacteremia), and  Covering of the brain and spinal cord (meningitis).  Pneumococcal pneumonia is most common among adults. Pneumococcal meningitis can cause deafness and brain damage, and it kills about 1 child in 10 who get it. Anyone can get pneumococcal disease, but children under 2 years of age and adults 65 years and older, people with certain medical conditions, and cigarette smokers are at the highest risk. Before there was a vaccine, the [place] saw:  more than 700 cases of meningitis,  about 13,000 blood infections,  about 5 million ear infections, and  about 200 deaths  in children under 5 each year from pneumococcal disease. Since vaccine became available, severe pneumococcal disease in these children has fallen by 88%. About 18,000 older adults die of pneumococcal disease each year in the [place]. Treatment of pneumococcal infections with penicillin and other drugs is not as effective as it used to be, because some strains of the disease have become resistant to these drugs. This makes prevention of the disease, through vaccination, even more important. 2. PCV13 vaccine Pneumococcal conjugate vaccine (called PCV13) protects against 13 types of pneumococcal bacteria. PCV13 is routinely given to children at 2, 4, 6, and 12-15 months of age. It is also recommended for children and adults 2 to 64 years of age with certain health conditions, and for all adults 65 years of age and older. Your doctor can give you details. 3. Some people should not get this vaccine Anyone who has ever had a  life-threatening allergic reaction to a dose of this vaccine, to an earlier pneumococcal vaccine called PCV7, or to any vaccine containing diphtheria toxoid (for example, DTaP), should not get PCV13. Anyone with a severe allergy to any component of PCV13 should not get the vaccine. Tell your doctor if the person being vaccinated has any severe allergies. If the person scheduled for vaccination is not feeling well, your healthcare provider might decide to reschedule the shot on another day. 4. Risks of a vaccine reaction With any medicine, including vaccines, there is a chance of reactions. These are usually mild and go away on their own, but serious reactions are also possible. Problems reported following PCV13 varied by age and dose in the series. The most common problems reported among children were:  About half became drowsy after the shot, had a temporary loss of appetite, or had redness or tenderness where the shot was given.  About 1 out of 3 had swelling where the shot was given.  About 1 out of 3 had a mild fever, and about 1 in 20 had a fever over 102.2F.  Up to about 8 out of 10 became fussy or irritable.  Adults have reported pain, redness, and swelling where the shot was given; also mild fever, fatigue, headache, chills, or muscle pain. Young children who get PCV13 along with inactivated flu vaccine at the same time may be at increased risk for seizures caused by fever. Ask your doctor for more information. Problems that could happen after any vaccine:  People sometimes faint after a medical procedure, including vaccination. Sitting or lying down for about 15 minutes can help prevent   fainting, and injuries caused by a fall. Tell your doctor if you feel dizzy, or have vision changes or ringing in the ears.  Some older children and adults get severe pain in the shoulder and have difficulty moving the arm where a shot was given. This happens very rarely.  Any medication can cause a  severe allergic reaction. Such reactions from a vaccine are very rare, estimated at about 1 in a million doses, and would happen within a few minutes to a few hours after the vaccination. As with any medicine, there is a very small chance of a vaccine causing a serious injury or death. The safety of vaccines is always being monitored. For more information, visit: www.cdc.gov/vaccinesafety/ 5. What if there is a serious reaction? What should I look for? Look for anything that concerns you, such as signs of a severe allergic reaction, very high fever, or unusual behavior. Signs of a severe allergic reaction can include hives, swelling of the face and throat, difficulty breathing, a fast heartbeat, dizziness, and weakness-usually within a few minutes to a few hours after the vaccination. What should I do?  If you think it is a severe allergic reaction or other emergency that can't wait, call 9-1-1 or get the person to the nearest hospital. Otherwise, call your doctor.  Reactions should be reported to the Vaccine Adverse Event Reporting System (VAERS). Your doctor should file this report, or you can do it yourself through the VAERS web site at www.vaers.hhs.gov, or by calling 1-800-822-7967. ? VAERS does not give medical advice. 6. The National Vaccine Injury Compensation Program The National Vaccine Injury Compensation Program (VICP) is a federal program that was created to compensate people who may have been injured by certain vaccines. Persons who believe they may have been injured by a vaccine can learn about the program and about filing a claim by calling 1-800-338-2382 or visiting the VICP website at www.hrsa.gov/vaccinecompensation. There is a time limit to file a claim for compensation. 7. How can I learn more?  Ask your healthcare provider. He or she can give you the vaccine package insert or suggest other sources of information.  Call your local or state health department.  Contact the  Centers for Disease Control and Prevention (CDC): ? Call 1-800-232-4636 (1-800-CDC-INFO) or ? Visit CDC's website at www.cdc.gov/vaccines Vaccine Information Statement, PCV13 Vaccine (07/25/2014) This information is not intended to replace advice given to you by your health care provider. Make sure you discuss any questions you have with your health care provider. Document Released: 07/04/2006 Document Revised: 05/27/2016 Document Reviewed: 05/27/2016 Elsevier Interactive Patient Education  2017 Elsevier Inc.  

## 2017-07-16 LAB — COMPREHENSIVE METABOLIC PANEL
A/G RATIO: 1.9 (ref 1.2–2.2)
ALBUMIN: 4.6 g/dL (ref 3.6–4.8)
ALT: 50 IU/L — ABNORMAL HIGH (ref 0–32)
AST: 33 IU/L (ref 0–40)
Alkaline Phosphatase: 62 IU/L (ref 39–117)
BILIRUBIN TOTAL: 0.4 mg/dL (ref 0.0–1.2)
BUN / CREAT RATIO: 17 (ref 12–28)
BUN: 12 mg/dL (ref 8–27)
CHLORIDE: 97 mmol/L (ref 96–106)
CO2: 24 mmol/L (ref 20–29)
Calcium: 10.1 mg/dL (ref 8.7–10.3)
Creatinine, Ser: 0.7 mg/dL (ref 0.57–1.00)
GFR calc non Af Amer: 91 mL/min/{1.73_m2} (ref >59)
GFR, EST AFRICAN AMERICAN: 105 mL/min/{1.73_m2} (ref >59)
GLOBULIN, TOTAL: 2.4 g/dL (ref 1.5–4.5)
Glucose: 128 mg/dL — ABNORMAL HIGH (ref 65–99)
POTASSIUM: 3.8 mmol/L (ref 3.5–5.2)
SODIUM: 139 mmol/L (ref 134–144)
TOTAL PROTEIN: 7 g/dL (ref 6.0–8.5)

## 2017-10-14 ENCOUNTER — Encounter: Payer: Self-pay | Admitting: Unknown Physician Specialty

## 2017-10-14 ENCOUNTER — Ambulatory Visit (INDEPENDENT_AMBULATORY_CARE_PROVIDER_SITE_OTHER): Payer: Medicare Other | Admitting: Unknown Physician Specialty

## 2017-10-14 ENCOUNTER — Ambulatory Visit (INDEPENDENT_AMBULATORY_CARE_PROVIDER_SITE_OTHER): Payer: Medicare Other

## 2017-10-14 VITALS — BP 147/79 | HR 96 | Temp 97.5°F | Wt 179.2 lb

## 2017-10-14 VITALS — BP 147/89 | HR 96 | Temp 97.5°F | Resp 16 | Ht 62.0 in | Wt 179.0 lb

## 2017-10-14 DIAGNOSIS — Z13820 Encounter for screening for osteoporosis: Secondary | ICD-10-CM

## 2017-10-14 DIAGNOSIS — N959 Unspecified menopausal and perimenopausal disorder: Secondary | ICD-10-CM

## 2017-10-14 DIAGNOSIS — E119 Type 2 diabetes mellitus without complications: Secondary | ICD-10-CM | POA: Diagnosis not present

## 2017-10-14 DIAGNOSIS — I10 Essential (primary) hypertension: Secondary | ICD-10-CM

## 2017-10-14 DIAGNOSIS — Z114 Encounter for screening for human immunodeficiency virus [HIV]: Secondary | ICD-10-CM | POA: Diagnosis not present

## 2017-10-14 DIAGNOSIS — Z0000 Encounter for general adult medical examination without abnormal findings: Secondary | ICD-10-CM

## 2017-10-14 DIAGNOSIS — D696 Thrombocytopenia, unspecified: Secondary | ICD-10-CM

## 2017-10-14 DIAGNOSIS — Z1159 Encounter for screening for other viral diseases: Secondary | ICD-10-CM | POA: Diagnosis not present

## 2017-10-14 LAB — BAYER DCA HB A1C WAIVED: HB A1C (BAYER DCA - WAIVED): 7 % — ABNORMAL HIGH (ref <7.0)

## 2017-10-14 MED ORDER — SIMVASTATIN 20 MG PO TABS: 20.0000 mg | ORAL_TABLET | Freq: Every day | ORAL | 3 refills | Status: DC

## 2017-10-14 NOTE — Progress Notes (Signed)
Subjective:   Charlene Juarez is a 67 y.o. female who presents for an Initial Medicare Annual Wellness Visit.  Review of Systems     Cardiac Risk Factors include: hypertension;advanced age (>37mn, >>60women);dyslipidemia;diabetes mellitus;obesity (BMI >30kg/m2)     Objective:    Today's Vitals   10/14/17 1347  BP: (!) 147/89  Pulse: 96  Resp: 16  Temp: (!) 97.5 F (36.4 C)  TempSrc: Oral  Weight: 179 lb 0.2 oz (81.2 kg)  Height: 5' 2"  (1.575 m)   Body mass index is 32.74 kg/m.  Advanced Directives 10/14/2017 01/13/2017  Does Patient Have a Medical Advance Directive? Yes Yes  Type of Advance Directive Living will Living will  Does patient want to make changes to medical advance directive? - No - Patient declined    Current Medications (verified) Outpatient Encounter Medications as of 10/14/2017  Medication Sig  . Ascorbic Acid (VITAMIN C) 1000 MG tablet Take 1,000 mg by mouth daily.  . Blood Glucose Monitoring Suppl (ONE TOUCH ULTRA SYSTEM KIT) W/DEVICE KIT 1 kit by Does not apply route once. E11.9  . Calcium Carbonate-Vitamin D (CALCIUM 500 + D PO) Take 1 tablet by mouth daily.  . Cholecalciferol (VITAMIN D3) 1000 units CAPS Take 1,000 Units by mouth at bedtime.  . Cinnamon 500 MG capsule Take 500 mg by mouth daily.  . diphenhydrAMINE (BENADRYL) 25 MG tablet Take 50 mg by mouth at bedtime as needed for sleep.  .Marland Kitchengabapentin (NEURONTIN) 100 MG capsule Take 100 mg by mouth 3 (three) times daily.   .Marland Kitchenglucose blood (BAYER CONTOUR TEST) test strip USE THREE (3) TIMES A WEEK.  .Marland Kitchenglucose blood test strip 1 each by Other route as needed for other. Use as instructed  . INVOKANA 300 MG TABS tablet TAKE ONE TABLET BY MOUTH ONCE DAILY  . losartan-hydrochlorothiazide (HYZAAR) 100-25 MG tablet Take 1 tablet by mouth daily.  . Magnesium 500 MG TABS Take 500 mg by mouth at bedtime.  . metFORMIN (GLUCOPHAGE) 1000 MG tablet Take 1 tablet (1,000 mg total) by mouth 2 (two) times  daily with a meal.  . Omega-3 Fatty Acids (FISH OIL) 1200 MG CAPS Take 1,200-2,400 mg by mouth 2 (two) times daily. 1 in the am and 2 at night  . Semaglutide (OZEMPIC) 1 MG/DOSE SOPN Inject 1 mg into the skin once a week.  . simvastatin (ZOCOR) 20 MG tablet Take 1 tablet (20 mg total) by mouth daily.  . vitamin E 200 UNIT capsule Take 200 Units by mouth daily.  . SOOLANTRA 1 % CREA Apply 1 application topically daily as needed (rosacea). (Patient not taking: Reported on 10/14/2017)  . [DISCONTINUED] simvastatin (ZOCOR) 20 MG tablet TAKE 1 TABLET (20 MG TOTAL) BY MOUTH DAILY.   No facility-administered encounter medications on file as of 10/14/2017.     Allergies (verified) Codeine sulfate and Lisinopril   History: Past Medical History:  Diagnosis Date  . Arthritis   . Bursitis   . [place]    SKIN  . Diabetes mellitus without complication ([place]   . GERD (gastroesophageal reflux disease)   . Hyperlipidemia   . Hypertension   . Sleep apnea    Past Surgical History:  Procedure Laterality Date  . cancer removal off nose    . CATARACT EXTRACTION W/PHACO Right 01/13/2017   Procedure: CATARACT EXTRACTION PHACO AND INTRAOCULAR LENS PLACEMENT (IOC);  Surgeon: BEulogio Bear MD;  Location: [facility] ORS;  Service: Ophthalmology;  Laterality: Right;  [place]31.1  AP% 0.0 CDE 1.78 Fluid Pack lot # [id number]  . CATARACT EXTRACTION W/PHACO Left 02/24/2017   Procedure: CATARACT EXTRACTION PHACO AND INTRAOCULAR LENS PLACEMENT (IOC);  Surgeon: Eulogio Bear, MD;  Location: [facility] ORS;  Service: Ophthalmology;  Laterality: Left;  [place]  00:45.5 AP  0.7 CDE   2.76  fluid pack lot # [id number]  exp. 08-19-2018  . DILATION AND CURETTAGE OF UTERUS    . TUBAL LIGATION     Family History  Problem Relation Age of Onset  . Alcohol abuse Mother   . Heart disease Father   . Alcohol abuse Father   . Diabetes Sister   . Hyperlipidemia Sister   . Diabetes Brother   . Hypertension Brother   . Thyroid  disease Daughter   . Diabetes Maternal Grandmother   . Hypertension Sister   . Aneurysm Sister   . Breast cancer Maternal Aunt    Social History   Socioeconomic History  . Marital status: Married    Spouse name: None  . Number of children: None  . Years of education: None  . Highest education level: None  Social Needs  . Financial resource strain: Not hard at all  . Food insecurity - worry: Never true  . Food insecurity - inability: Never true  . Transportation needs - medical: No  . Transportation needs - non-medical: No  Occupational History  . None  Tobacco Use  . Smoking status: Never Smoker  . Smokeless tobacco: Never Used  Substance and Sexual Activity  . Alcohol use: No    Alcohol/week: 0.0 oz  . Drug use: No  . Sexual activity: Yes  Other Topics Concern  . None  Social History Narrative  . None    Tobacco Counseling Counseling given: Not Answered   Clinical Intake:  Pre-visit preparation completed: Yes  Pain : No/denies pain     Nutritional Status: BMI > 30  Obese Nutritional Risks: None Diabetes: No  How often do you need to have someone help you when you read instructions, pamphlets, or other written materials from your doctor or pharmacy?: 1 - Never What is the last grade level you completed in school?: associates degree   Interpreter Needed?: No  Information entered by :: Gerrica Cygan,LPN    Activities of Daily Living In your present state of health, do you have any difficulty performing the following activities: 10/14/2017 04/19/2017  Hearing? N Y  Comment - left ear  Vision? N N  Difficulty concentrating or making decisions? N N  Walking or climbing stairs? N N  Dressing or bathing? N N  Doing errands, shopping? N N  Preparing Food and eating ? N -  Using the Toilet? N -  In the past six months, have you accidently leaked urine? N -  Do you have problems with loss of bowel control? N -  Managing your Medications? N -  Managing your  Finances? N -  Housekeeping or managing your Housekeeping? N -  Some recent data might be hidden     Immunizations and Health Maintenance Immunization History  Administered Date(s) Administered  . Influenza, High Dose Seasonal PF 07/15/2017  . Influenza-Unspecified 06/21/2016  . Pneumococcal Conjugate-13 07/15/2017  . Pneumococcal Polysaccharide-23 05/22/2014  . Tdap 03/04/2008  . Zoster 02/26/2012   Health Maintenance Due  Topic Date Due  . DEXA SCAN  10/20/2016  . PAP SMEAR  05/22/2017    Patient Care Team: Kathrine Haddock, NP as PCP - General (Nurse Practitioner)  St[facility] Kidney  any recent Medical Services you may have received from other than [facility] providers in the past year (date may be approximate).     Assessment:   This is a routine wellness examination for [place].  Hearing/Vision screen Vision Screening Comments: Goes [facility]  Dietary issues and exercise activities discussed: Current Exercise Habits: Home exercise routine, Type of exercise: walking, Time (Minutes): 40, Intensity: Mild, Exercise limited by: None identified  Goals    . DIET - INCREASE WATER INTAKE     Recommend drinking at least 6-8 glasses of water a day       Depression Screen PHQ 2/9 Scores 10/14/2017 04/19/2017  PHQ - 2 Score 0 0  PHQ- 9 Score - 1    Fall Risk Fall Risk  10/14/2017 04/19/2017  Falls in the past year? No No    Is the patient's home free of loose throw rugs in walkways, pet beds, electrical cords, etc?   yes      Grab bars in the bathroom? yes      Handrails on the stairs?   yes      Adequate lighting?   yes  Timed Get Up and Go Performed completed in 8 seconds with no use of assistive devices. Steady gait. No intervention needed at this time.   Cognitive Function:        Screening Tests Health Maintenance  Topic Date Due  . DEXA SCAN  10/20/2016  . PAP SMEAR  05/22/2017  . OPHTHALMOLOGY EXAM  11/18/2017  . HEMOGLOBIN A1C  01/13/2018  .  TETANUS/TDAP  03/04/2018  . FOOT EXAM  05/13/2018  . MAMMOGRAM  05/12/2019  . PNA vac Low Risk Adult (2 of 2 - PPSV23) 05/23/2019  . COLONOSCOPY  09/27/2022  . INFLUENZA VACCINE  Completed  . Hepatitis C Screening  Completed  . HIV Screening  Completed    Qualifies for Shingles Vaccine? Discussed shingrix vaccine   Cancer Screenings: Lung: Low Dose CT Chest recommended if Age 39-80 years, 30 pack-year currently smoking OR have quit w/in 15years. Patient does not qualify. Breast: Up to date on Mammogram? Yes   Up to date of Bone Density/Dexa? No ordered Colorectal: up to date   Additional Screenings:  Hepatitis B/HIV/Syphillis: done today  Hepatitis C Screening: done today      Plan:    I have personally reviewed and addressed the Medicare Annual Wellness questionnaire and have noted the following in the patient's chart:  A. Medical and social history B. Use of alcohol, tobacco or illicit drugs  C. Current medications and supplements D. Functional ability and status E.  Nutritional status F.  Physical activity G. Advance directives H. List of other physicians I.  Hospitalizations, surgeries, and ER visits in previous 12 months J.  [place] such as hearing and vision if needed, cognitive and depression L. Referrals and appointments   In addition, I have reviewed and discussed with patient certain preventive protocols, quality metrics, and best practice recommendations. A written personalized care plan for preventive services as well as general preventive health recommendations were provided to patient.   Signed,  Tyler Aas, LPN Nurse Health Advisor   Nurse Notes: none

## 2017-10-14 NOTE — Assessment & Plan Note (Signed)
Stable, continue present medications.   

## 2017-10-14 NOTE — Patient Instructions (Signed)
[name] , Thank you for taking time to come for your Medicare Wellness Visit. I appreciate your ongoing commitment to your health goals. Please review the following plan we discussed and let me know if I can assist you in the future.   Screening recommendations/referrals: Colonoscopy: completed 09/27/2012 Mammogram: completed 05/11/2017 Bone Density: due Please call [phone] to schedule your bone density  Recommended yearly ophthalmology/optometry visit for glaucoma screening and checkup Recommended yearly dental visit for hygiene and checkup  Vaccinations: Influenza vaccine: up to date  Pneumococcal vaccine: pneumovax 23 due 07/12/2018 Tdap vaccine: up to date Shingles vaccine: up to date   Advanced directives: Advance directive discussed with you today. I have provided a copy for you to complete at home and have notarized. Once this is complete please bring a copy in to our office so we can scan it into your chart.  Conditions/risks identified: Recommend drinking at least 6-8 glasses of water a day   Next appointment:Follow up in one year for your annual wellness exam.    Preventive Care 65 Years and Older, Female Preventive care refers to lifestyle choices and visits with your health care provider that can promote health and wellness. What does preventive care include?  A yearly physical exam. This is also called an annual well check.  Dental exams once or twice a year.  Routine eye exams. Ask your health care provider how often you should have your eyes checked.  Personal lifestyle choices, including:  Daily care of your teeth and gums.  Regular physical activity.  Eating a healthy diet.  Avoiding tobacco and drug use.  Limiting alcohol use.  Practicing safe sex.  Taking low-dose aspirin every day.  Taking vitamin and mineral supplements as recommended by your health care provider. What happens during an annual well check? The services and screenings done  by your health care provider during your annual well check will depend on your age, overall health, lifestyle risk factors, and family history of disease. Counseling  Your health care provider may ask you questions about your:  Alcohol use.  Tobacco use.  Drug use.  Emotional well-being.  Home and relationship well-being.  Sexual activity.  Eating habits.  History of falls.  Memory and ability to understand (cognition).  Work and work Statistician.  Reproductive health. Screening  You may have the following tests or measurements:  Height, weight, and BMI.  Blood pressure.  Lipid and cholesterol levels. These may be checked every 5 years, or more frequently if you are over 33 years old.  Skin check.  Lung cancer screening. You may have this screening every year starting at age 46 if you have a 30-pack-year history of smoking and currently smoke or have quit within the past 15 years.  Fecal occult blood test (FOBT) of the stool. You may have this test every year starting at age 70.  Flexible sigmoidoscopy or colonoscopy. You may have a sigmoidoscopy every 5 years or a colonoscopy every 10 years starting at age 68.  Hepatitis C blood test.  Hepatitis B blood test.  Sexually transmitted disease (STD) testing.  Diabetes screening. This is done by checking your blood sugar (glucose) after you have not eaten for a while (fasting). You may have this done every 1-3 years.  Bone density scan. This is done to screen for osteoporosis. You may have this done starting at age 38.  Mammogram. This may be done every 1-2 years. Talk to your health care provider about how often you should  have regular mammograms. Talk with your health care provider about your test results, treatment options, and if necessary, the need for more tests. Vaccines  Your health care provider may recommend certain vaccines, such as:  Influenza vaccine. This is recommended every year.  Tetanus,  diphtheria, and acellular pertussis (Tdap, Td) vaccine. You may need a Td booster every 10 years.  Zoster vaccine. You may need this after age 34.  Pneumococcal 13-valent conjugate (PCV13) vaccine. One dose is recommended after age 30.  Pneumococcal polysaccharide (PPSV23) vaccine. One dose is recommended after age 55. Talk to your health care provider about which screenings and vaccines you need and how often you need them. This information is not intended to replace advice given to you by your health care provider. Make sure you discuss any questions you have with your health care provider. Document Released: 10/03/2015 Document Revised: 05/26/2016 Document Reviewed: 07/08/2015 Elsevier Interactive Patient Education  2017 [place] Prevention in the Home Falls can cause injuries. They can happen to people of all ages. There are many things you can do to make your home safe and to help prevent falls. What can I do on the outside of my home?  Regularly fix the edges of walkways and driveways and fix any cracks.  Remove anything that might make you trip as you walk through a door, such as a raised step or threshold.  Trim any bushes or trees on the path to your home.  Use bright outdoor lighting.  Clear any walking paths of anything that might make someone trip, such as rocks or tools.  Regularly check to see if handrails are loose or broken. Make sure that both sides of any steps have handrails.  Any raised decks and porches should have guardrails on the edges.  Have any leaves, snow, or ice cleared regularly.  Use sand or salt on walking paths during winter.  Clean up any spills in your garage right away. This includes oil or grease spills. What can I do in the bathroom?  Use night lights.  Install grab bars by the toilet and in the tub and shower. Do not use towel bars as grab bars.  Use non-skid mats or decals in the tub or shower.  If you need to sit down in  the shower, use a plastic, non-slip stool.  Keep the floor dry. Clean up any water that spills on the floor as soon as it happens.  Remove soap buildup in the tub or shower regularly.  Attach bath mats securely with double-sided non-slip rug tape.  Do not have throw rugs and other things on the floor that can make you trip. What can I do in the bedroom?  Use night lights.  Make sure that you have a light by your bed that is easy to reach.  Do not use any sheets or blankets that are too big for your bed. They should not hang down onto the floor.  Have a firm chair that has side arms. You can use this for support while you get dressed.  Do not have throw rugs and other things on the floor that can make you trip. What can I do in the kitchen?  Clean up any spills right away.  Avoid walking on wet floors.  Keep items that you use a lot in easy-to-reach places.  If you need to reach something above you, use a strong step stool that has a grab bar.  Keep electrical cords out of  the way.  Do not use floor polish or wax that makes floors slippery. If you must use wax, use non-skid floor wax.  Do not have throw rugs and other things on the floor that can make you trip. What can I do with my stairs?  Do not leave any items on the stairs.  Make sure that there are handrails on both sides of the stairs and use them. Fix handrails that are broken or loose. Make sure that handrails are as long as the stairways.  Check any carpeting to make sure that it is firmly attached to the stairs. Fix any carpet that is loose or worn.  Avoid having throw rugs at the top or bottom of the stairs. If you do have throw rugs, attach them to the floor with carpet tape.  Make sure that you have a light switch at the top of the stairs and the bottom of the stairs. If you do not have them, ask someone to add them for you. What else can I do to help prevent falls?  Wear shoes that:  Do not have high  heels.  Have rubber bottoms.  Are comfortable and fit you well.  Are closed at the toe. Do not wear sandals.  If you use a stepladder:  Make sure that it is fully opened. Do not climb a closed stepladder.  Make sure that both sides of the stepladder are locked into place.  Ask someone to hold it for you, if possible.  Clearly mark and make sure that you can see:  Any grab bars or handrails.  First and last steps.  Where the edge of each step is.  Use tools that help you move around (mobility aids) if they are needed. These include:  Canes.  Walkers.  Scooters.  Crutches.  Turn on the lights when you go into a dark area. Replace any light bulbs as soon as they burn out.  Set up your furniture so you have a clear path. Avoid moving your furniture around.  If any of your floors are uneven, fix them.  If there are any pets around you, be aware of where they are.  Review your medicines with your doctor. Some medicines can make you feel dizzy. This can increase your chance of falling. Ask your doctor what other things that you can do to help prevent falls. This information is not intended to replace advice given to you by your health care provider. Make sure you discuss any questions you have with your health care provider. Document Released: 07/03/2009 Document Revised: 02/12/2016 Document Reviewed: 10/11/2014 Elsevier Interactive Patient Education  2017 [employer].

## 2017-10-14 NOTE — Assessment & Plan Note (Addendum)
Hgb A1C is 7.0 and continually improving.  Continue present medication

## 2017-10-14 NOTE — Assessment & Plan Note (Signed)
Platelet count is normal.

## 2017-10-14 NOTE — Progress Notes (Signed)
BP (!) 147/79   Pulse 96   Temp (!) 97.5 F (36.4 C) (Oral)   Wt 179 lb 3.2 oz (81.3 kg)   LMP 07/13/2000 (Approximate)   SpO2 96%   BMI 32.57 kg/m    Subjective:    Patient ID: Charlene Juarez, female    DOB: [birth year], 67 y.o.   MRN: [id number]  HPI: Charlene Juarez is a 67 y.o. female  Chief Complaint  Patient presents with  . Diabetes  . Hyperlipidemia  . Hypertension   Diabetes: Taking Ozempic.  Sometimes nauseated if she eats wrong.   Using medications without difficulties No hypoglycemic episodes No hyperglycemic episodes Feet problems:none Blood Sugars averaging:Not like she was eye exam within last year Last Hgb A1C: 7.6  Hypertension  Using medications without difficulty Average home BPs: Not checking  Using medication without problems or lightheadedness No chest pain with exertion or shortness of breath No Edema  Elevated Cholesterol Using medications without problems No Muscle aches  Diet: Exercise:Just got back for [place] and [place].  Trying to get back into the routine  Thrombocytopenia On problem list.  I don't see any follow-up or resolution   Relevant past medical, surgical, family and social history reviewed and updated as indicated. Interim medical history since our last visit reviewed. Allergies and medications reviewed and updated.  Review of Systems  Constitutional: Negative.   HENT: Negative.   Respiratory: Negative.   Gastrointestinal: Negative.   Genitourinary: Negative.   Musculoskeletal: Negative.   Psychiatric/Behavioral: Negative.     Per HPI unless specifically indicated above     Objective:    BP (!) 147/79   Pulse 96   Temp (!) 97.5 F (36.4 C) (Oral)   Wt 179 lb 3.2 oz (81.3 kg)   LMP 07/13/2000 (Approximate)   SpO2 96%   BMI 32.57 kg/m   Wt Readings from Last 3 Encounters:  10/14/17 179 lb 3.2 oz (81.3 kg)  07/15/17 174 lb 3.2 oz (79 kg)  05/13/17 180 lb 6.4 oz (81.8 kg)    Physical Exam    Constitutional: She is oriented to person, place, and time. She appears well-developed and well-nourished. No distress.  HENT:  Head: Normocephalic and atraumatic.  Eyes: Conjunctivae and lids are normal. Right eye exhibits no discharge. Left eye exhibits no discharge. No scleral icterus.  Neck: Normal range of motion. Neck supple. No JVD present. Carotid bruit is not present.  Cardiovascular: Normal rate, regular rhythm and normal heart sounds.  Pulmonary/Chest: Effort normal and breath sounds normal.  Abdominal: Normal appearance. There is no splenomegaly or hepatomegaly.  Musculoskeletal: Normal range of motion.  Neurological: She is alert and oriented to person, place, and time.  Skin: Skin is warm, dry and intact. No rash noted. No pallor.  Psychiatric: She has a normal mood and affect. Her behavior is normal. Judgment and thought content normal.    Results for orders placed or performed in visit on 07/15/17  Bayer DCA Hb A1c Waived  Result Value Ref Range   Bayer DCA Hb A1c Waived 7.6 (H) <7.0 %  Comprehensive metabolic panel  Result Value Ref Range   Glucose 128 (H) 65 - 99 mg/dL   BUN 12 8 - 27 mg/dL   Creatinine, Ser 0.70 0.57 - 1.00 mg/dL   GFR calc non Af Amer 91 >59 mL/min/1.73   GFR calc Af Amer 105 >59 mL/min/1.73   BUN/Creatinine Ratio 17 12 - 28   Sodium 139 134 - 144 mmol/L  Potassium 3.8 3.5 - 5.2 mmol/L   Chloride 97 96 - 106 mmol/L   CO2 24 20 - 29 mmol/L   Calcium 10.1 8.7 - 10.3 mg/dL   Total Protein 7.0 6.0 - 8.5 g/dL   Albumin 4.6 3.6 - 4.8 g/dL   Globulin, Total 2.4 1.5 - 4.5 g/dL   Albumin/Globulin Ratio 1.9 1.2 - 2.2   Bilirubin Total 0.4 0.0 - 1.2 mg/dL   Alkaline Phosphatase 62 39 - 117 IU/L   AST 33 0 - 40 IU/L   ALT 50 (H) 0 - 32 IU/L      Assessment & Plan:   Problem List Items Addressed This Visit      Unprioritized   Diabetes ([place])    Hgb A1C is 7.0 and continually improving.  Continue present medication      Relevant Medications    simvastatin (ZOCOR) 20 MG tablet   Other Relevant Orders   Bayer DCA Hb A1c Waived   Hypertension - Primary    Stable, continue present medications.        Relevant Medications   simvastatin (ZOCOR) 20 MG tablet   Other Relevant Orders   Comprehensive metabolic panel   RESOLVED: Thrombocytopenia (HCC)    Platelet count is normal      Relevant Orders   CBC With Differential/Platelet    Other Visit Diagnoses    Need for hepatitis C screening test       Relevant Orders   Hepatitis C antibody   Encounter for screening for HIV       Relevant Orders   HIV antibody       Follow up plan: Return in about 3 months (around 01/12/2018).

## 2017-10-15 LAB — HIV ANTIBODY (ROUTINE TESTING W REFLEX): HIV Screen 4th Generation wRfx: NONREACTIVE

## 2017-10-15 LAB — COMPREHENSIVE METABOLIC PANEL
A/G RATIO: 1.8 (ref 1.2–2.2)
ALK PHOS: 60 IU/L (ref 39–117)
ALT: 39 IU/L — AB (ref 0–32)
AST: 23 IU/L (ref 0–40)
Albumin: 4.6 g/dL (ref 3.6–4.8)
BILIRUBIN TOTAL: 0.4 mg/dL (ref 0.0–1.2)
BUN/Creatinine Ratio: 22 (ref 12–28)
BUN: 16 mg/dL (ref 8–27)
CALCIUM: 10.1 mg/dL (ref 8.7–10.3)
CHLORIDE: 96 mmol/L (ref 96–106)
CO2: 22 mmol/L (ref 20–29)
Creatinine, Ser: 0.72 mg/dL (ref 0.57–1.00)
GFR calc Af Amer: 102 mL/min/{1.73_m2} (ref >59)
GFR, EST NON AFRICAN AMERICAN: 88 mL/min/{1.73_m2} (ref >59)
Globulin, Total: 2.6 g/dL (ref 1.5–4.5)
Glucose: 234 mg/dL — ABNORMAL HIGH (ref 65–99)
POTASSIUM: 3.8 mmol/L (ref 3.5–5.2)
Sodium: 137 mmol/L (ref 134–144)
Total Protein: 7.2 g/dL (ref 6.0–8.5)

## 2017-10-15 LAB — HEPATITIS C ANTIBODY

## 2017-10-17 ENCOUNTER — Other Ambulatory Visit: Payer: Self-pay | Admitting: *Deleted

## 2017-10-17 ENCOUNTER — Telehealth: Payer: Self-pay | Admitting: Unknown Physician Specialty

## 2017-10-17 ENCOUNTER — Ambulatory Visit: Payer: Medicare Other | Admitting: Unknown Physician Specialty

## 2017-10-17 MED ORDER — CANAGLIFLOZIN 300 MG PO TABS: 300.0000 mg | ORAL_TABLET | Freq: Every day | ORAL | 1 refills | Status: DC

## 2017-10-17 NOTE — Progress Notes (Signed)
Refill for inv[facility]ana sent to [facility]; last office visit 10/14/17.

## 2017-10-17 NOTE — Telephone Encounter (Signed)
Copied from [place]. Topic: Quick Communication - Rx Refill/Question >> Oct 17, 2017 10:21 AM Burnis Medin, NT wrote: Medication:  INVOKANA 300 MG TABS tablet  Has the patient contacted their pharmacy? Yes    (Agent: If no, request that the patient contact the pharmacy for the refill.)   Preferred Pharmacy (with phone number or street name): [id number] [place], [address] [phone] (Phone) [phone] (Fax)    Agent: Please be advised that RX refills may take up to 3 business days. We ask that you follow-up with your pharmacy.

## 2017-10-17 NOTE — Telephone Encounter (Signed)
Attempted to contact pt regarding prescription refill request for invokana; left message on voicemail [phone]; refill request sent to [facility].

## 2017-10-25 LAB — CBC WITH DIFFERENTIAL/PLATELET
Hematocrit: 46.2 % (ref 34.0–46.6)
Hemoglobin: 15.8 g/dL (ref 11.1–15.9)
LYMPHS ABS: 1.8 10*3/uL (ref 0.7–3.1)
LYMPHS: 29 %
MCH: 30.7 pg (ref 26.6–33.0)
MCHC: 34.2 g/dL (ref 31.5–35.7)
MCV: 90 fL (ref 79–97)
MID (ABSOLUTE): 0.5 10*3/uL (ref 0.1–1.6)
MID: 8 %
NEUTROS PCT: 63 %
Neutrophils Absolute: 3.9 10*3/uL (ref 1.4–7.0)
PLATELETS: 203 10*3/uL (ref 150–379)
RBC: 5.14 x10E6/uL (ref 3.77–5.28)
RDW: 13.4 % (ref 12.3–15.4)
WBC: 6.2 10*3/uL (ref 3.4–10.8)

## 2017-11-17 ENCOUNTER — Other Ambulatory Visit: Payer: Self-pay | Admitting: Unknown Physician Specialty

## 2017-11-17 NOTE — Telephone Encounter (Signed)
Copied from [place]. Topic: Quick Communication - Rx Refill/Question >> Nov 17, 2017  2:30 PM Bea Graff, NT wrote: Medication: gabapentin (NEURONTIN) 90 day if possible   Has the patient contacted their pharmacy? Yes.     (Agent: If no, request that the patient contact the pharmacy for the refill.)   Preferred Pharm[facility] (with phone number [facility] [place]   Agent: Please be advised that RX refills may take up to 3 business days. We ask that you follow-up with your pharmacy.

## 2017-11-18 MED ORDER — GABAPENTIN 100 MG PO CAPS: 100.0000 mg | ORAL_CAPSULE | Freq: Three times a day (TID) | ORAL | 12 refills | Status: DC

## 2017-11-18 NOTE — Telephone Encounter (Signed)
LOV 10/14/17 [facility]

## 2017-12-06 ENCOUNTER — Telehealth: Payer: Self-pay

## 2017-12-06 MED ORDER — EMPAGLIFLOZIN 25 MG PO TABS
25.0000 mg | ORAL_TABLET | Freq: Every day | ORAL | 1 refills | Status: DC
Start: 2017-12-06 — End: 2018-01-13

## 2017-12-06 NOTE — Telephone Encounter (Signed)
Copied from [place] [phone]. Topic: Quick Communication - See Telephone Encounter >> Dec 05, 2017  2:09 PM Oneta Rack wrote: Relation to TC:CEQF  Call back number: [phone] [facility]acy[id number] [place], [place] [phone] (Phone) [phone] (Fax)   Reason for call:  Patient requesting all medications prescribed from this point forward pleas make a 90 day supply, please note >> Dec 05, 2017  2:17 PM Oneta Rack wrote: Relation to EV:QWQV  Call back number: [phone] [id number] [place], [place] [phone] (Phone) [phone] (Fax)   Reason for call:  Patient requesting all medications prescribed from this point forward pleas make a 90 day supply, please note   Noted. Thanks.

## 2017-12-06 NOTE — Telephone Encounter (Signed)
Received a fax from [facility] stating that the patient's invokana is not a covered drug on the patient's insurance plan. Preferred alternatives are farxiga or jardiance. Can we change the patient to one of these instead?

## 2017-12-06 NOTE — Telephone Encounter (Signed)
Called and left patient a VM (signed DPR) letting her know about medication change. Asked for patient to call back with any questions or concerns.

## 2017-12-06 NOTE — Telephone Encounter (Signed)
Jardiance written

## 2018-01-13 ENCOUNTER — Encounter: Payer: Self-pay | Admitting: Unknown Physician Specialty

## 2018-01-13 ENCOUNTER — Ambulatory Visit (INDEPENDENT_AMBULATORY_CARE_PROVIDER_SITE_OTHER): Payer: Medicare Other | Admitting: Unknown Physician Specialty

## 2018-01-13 VITALS — BP 128/74 | HR 86 | Temp 98.0°F | Ht 62.0 in | Wt 180.8 lb

## 2018-01-13 DIAGNOSIS — J4520 Mild intermittent asthma, uncomplicated: Secondary | ICD-10-CM | POA: Diagnosis not present

## 2018-01-13 DIAGNOSIS — E7800 Pure hypercholesterolemia, unspecified: Secondary | ICD-10-CM | POA: Diagnosis not present

## 2018-01-13 DIAGNOSIS — I10 Essential (primary) hypertension: Secondary | ICD-10-CM | POA: Diagnosis not present

## 2018-01-13 DIAGNOSIS — L719 Rosacea, unspecified: Secondary | ICD-10-CM | POA: Diagnosis not present

## 2018-01-13 DIAGNOSIS — E119 Type 2 diabetes mellitus without complications: Secondary | ICD-10-CM | POA: Diagnosis not present

## 2018-01-13 LAB — BAYER DCA HB A1C WAIVED: HB A1C (BAYER DCA - WAIVED): 6.9 % (ref <7.0)

## 2018-01-13 MED ORDER — METRONIDAZOLE 0.75 % EX CREA: TOPICAL_CREAM | Freq: Two times a day (BID) | CUTANEOUS | 0 refills | Status: DC

## 2018-01-13 MED ORDER — SEMAGLUTIDE (1 MG/DOSE) 2 MG/1.5ML ~~LOC~~ SOPN: 1.0000 mg | PEN_INJECTOR | SUBCUTANEOUS | 3 refills | Status: DC

## 2018-01-13 MED ORDER — SIMVASTATIN 20 MG PO TABS: 20.0000 mg | ORAL_TABLET | Freq: Every day | ORAL | 3 refills | Status: DC

## 2018-01-13 MED ORDER — ALBUTEROL SULFATE HFA 108 (90 BASE) MCG/ACT IN AERS: 2.0000 | INHALATION_SPRAY | Freq: Four times a day (QID) | RESPIRATORY_TRACT | 2 refills | Status: DC | PRN

## 2018-01-13 MED ORDER — METFORMIN HCL 1000 MG PO TABS: 1000.0000 mg | ORAL_TABLET | Freq: Two times a day (BID) | ORAL | 3 refills | Status: DC

## 2018-01-13 MED ORDER — EMPAGLIFLOZIN 25 MG PO TABS
25.0000 mg | ORAL_TABLET | Freq: Every day | ORAL | 1 refills | Status: DC
Start: 2018-01-13 — End: 2018-07-07

## 2018-01-13 MED ORDER — LOSARTAN POTASSIUM-HCTZ 100-25 MG PO TABS: 1.0000 | ORAL_TABLET | Freq: Every day | ORAL | 1 refills | Status: DC

## 2018-01-13 NOTE — Assessment & Plan Note (Signed)
Pt would like something for this.  Tried an expensive cream she can't afford.  Will order metronidazole cream

## 2018-01-13 NOTE — Assessment & Plan Note (Signed)
Stable, continue present medications.   

## 2018-01-13 NOTE — Assessment & Plan Note (Signed)
SOB with pollen.  Rx for Albutero inhaler prn.  If using more than 1 cannister a month consider Symbicort

## 2018-01-13 NOTE — Assessment & Plan Note (Signed)
Hgb A1C 6.9% Continue present treatment.  Recheck in 6 months

## 2018-01-13 NOTE — Progress Notes (Signed)
BP 128/74   Pulse 86   Temp 98 F (36.7 C) (Oral)   Ht 5\' 2"  (1.575 m)   Wt 180 lb 12.8 oz (82 kg)   LMP 07/13/2000 (Approximate)   SpO2 96%   BMI 33.07 kg/m    Subjective:    Patient ID: Charlene Juarez, female    DOB: 1951-05-15, 67 y.o.   MRN: [id number]  HPI: Charlene Juarez is a 67 y.o. female  Chief Complaint  Patient presents with  . Diabetes  . Hyperlipidemia  . Hypertension  . Allergies    pt states her allergies have really been acting up lately, thinks she may need an inhaler   Diabetes: Using medications without difficulties No hypoglycemic episodes No hyperglycemic episodes Feet problems:none Blood Sugars averaging: 140-160 eye exam within last year: done in August 2018 Last Hgb A1C: 7.0  Hypertension  Using medications without difficulty Average home BPs "been good"   Using medication without problems or lightheadedness No chest pain with exertion or shortness of breath No Edema  Elevated Cholesterol Using medications without problems No Muscle aches  Diet: Not eating as much as she was after taking ozempic Exercise: Working outside  Allergies/Asthma A lot of problems with allergies.  This is causing a lot of cough.  Not taking any inhalers at this time despite past diagnosis of asthma    Relevant past medical, surgical, family and social history reviewed and updated as indicated. Interim medical history since our last visit reviewed. Allergies and medications reviewed and updated.  Review of Systems  Skin:       Rosacea bothering and would like something for it    Per HPI unless specifically indicated above     Objective:    BP 128/74   Pulse 86   Temp 98 F (36.7 C) (Oral)   Ht 5\' 2"  (1.575 m)   Wt 180 lb 12.8 oz (82 kg)   LMP 07/13/2000 (Approximate)   SpO2 96%   BMI 33.07 kg/m   Wt Readings from Last 3 Encounters:  01/13/18 180 lb 12.8 oz (82 kg)  10/14/17 179 lb 0.2 oz (81.2 kg)  10/14/17  179 lb 3.2 oz (81.3 kg)    Physical Exam  Constitutional: She is oriented to person, place, and time. She appears well-developed and well-nourished. No distress.  HENT:  Head: Normocephalic and atraumatic.  Eyes: Conjunctivae and lids are normal. Right eye exhibits no discharge. Left eye exhibits no discharge. No scleral icterus.  Neck: Normal range of motion. Neck supple. No JVD present. Carotid bruit is not present.  Cardiovascular: Normal rate, regular rhythm and normal heart sounds.  Pulmonary/Chest: Effort normal and breath sounds normal.  Abdominal: Normal appearance. There is no splenomegaly or hepatomegaly.  Musculoskeletal: Normal range of motion.  Neurological: She is alert and oriented to person, place, and time.  Skin: Skin is warm, dry and intact. No rash noted. No pallor.  Psychiatric: She has a normal mood and affect. Her behavior is normal. Judgment and thought content normal.    Results for orders placed or performed in visit on 10/14/17  Comprehensive metabolic panel  Result Value Ref Range   Glucose 234 (H) 65 - 99 mg/dL   BUN 16 8 - 27 mg/dL   Creatinine, Ser 0.72 0.57 - 1.00 mg/dL   GFR calc non Af Amer 88 >59 mL/min/1.73   GFR calc Af Amer 102 >59 mL/min/1.73   BUN/Creatinine Ratio 22 12 - 28   Sodium 137 134 -  144 mmol/L   Potassium 3.8 3.5 - 5.2 mmol/L   Chloride 96 96 - 106 mmol/L   CO2 22 20 - 29 mmol/L   Calcium 10.1 8.7 - 10.3 mg/dL   Total Protein 7.2 6.0 - 8.5 g/dL   Albumin 4.6 3.6 - 4.8 g/dL   Globulin, Total 2.6 1.5 - 4.5 g/dL   Albumin/Globulin Ratio 1.8 1.2 - 2.2   Bilirubin Total 0.4 0.0 - 1.2 mg/dL   Alkaline Phosphatase 60 39 - 117 IU/L   AST 23 0 - 40 IU/L   ALT 39 (H) 0 - 32 IU/L  Bayer DCA Hb A1c Waived  Result Value Ref Range   Bayer DCA Hb A1c Waived 7.0 (H) <7.0 %  HIV antibody  Result Value Ref Range   HIV Screen 4th Generation wRfx Non Reactive Non Reactive  Hepatitis C antibody  Result Value Ref Range   Hep C Virus Ab <0.1  0.0 - 0.9 s/co ratio  CBC With Differential/Platelet  Result Value Ref Range   WBC 6.2 3.4 - 10.8 x10E3/uL   RBC 5.14 3.77 - 5.28 x10E6/uL   Hemoglobin 15.8 11.1 - 15.9 g/dL   Hematocrit 46.2 34.0 - 46.6 %   MCV 90 79 - 97 fL   MCH 30.7 26.6 - 33.0 pg   MCHC 34.2 31.5 - 35.7 g/dL   RDW 13.4 12.3 - 15.4 %   Platelets 203 150 - 379 x10E3/uL   Neutrophils 63 Not Estab. %   Lymphs 29 Not Estab. %   MID 8 Not Estab. %   Neutrophils Absolute 3.9 1.4 - 7.0 x10E3/uL   Lymphocytes Absolute 1.8 0.7 - 3.1 x10E3/uL   MID (Absolute) 0.5 0.1 - 1.6 X10E3/uL      Assessment & Plan:   Problem List Items Addressed This Visit      Unprioritized   Asthma, intermittent    SOB with pollen.  Rx for Albutero inhaler prn.  If using more than 1 cannister a month consider Symbicort      Relevant Medications   albuterol (PROVENTIL HFA;VENTOLIN HFA) 108 (90 Base) MCG/ACT inhaler   Diabetes (HCC) - Primary    Hgb A1C 6.9% Continue present treatment.  Recheck in 6 months      Relevant Medications   empagliflozin (JARDIANCE) 25 MG TABS tablet   simvastatin (ZOCOR) 20 MG tablet   metFORMIN (GLUCOPHAGE) 1000 MG tablet   losartan-hydrochlorothiazide (HYZAAR) 100-25 MG tablet   Semaglutide (OZEMPIC) 1 MG/DOSE SOPN   Other Relevant Orders   Comprehensive metabolic panel   Bayer DCA Hb A1c Waived   Hyperlipidemia    Stable, continue present medications.        Relevant Medications   simvastatin (ZOCOR) 20 MG tablet   losartan-hydrochlorothiazide (HYZAAR) 100-25 MG tablet   Hypertension    Stable, continue present medications.        Relevant Medications   simvastatin (ZOCOR) 20 MG tablet   losartan-hydrochlorothiazide (HYZAAR) 100-25 MG tablet   Rosacea    Pt would like something for this.  Tried an expensive cream she can't afford.  Will order metronidazole cream          Follow up plan: Return in about 3 months (around 04/14/2018) for PE.

## 2018-01-14 LAB — COMPREHENSIVE METABOLIC PANEL
ALBUMIN: 4.3 g/dL (ref 3.6–4.8)
ALK PHOS: 53 IU/L (ref 39–117)
ALT: 43 IU/L — ABNORMAL HIGH (ref 0–32)
AST: 31 IU/L (ref 0–40)
Albumin/Globulin Ratio: 2 (ref 1.2–2.2)
BUN / CREAT RATIO: 21 (ref 12–28)
BUN: 18 mg/dL (ref 8–27)
Bilirubin Total: 0.4 mg/dL (ref 0.0–1.2)
CO2: 23 mmol/L (ref 20–29)
CREATININE: 0.87 mg/dL (ref 0.57–1.00)
Calcium: 9.9 mg/dL (ref 8.7–10.3)
Chloride: 99 mmol/L (ref 96–106)
GFR calc non Af Amer: 70 mL/min/{1.73_m2} (ref >59)
GFR, EST AFRICAN AMERICAN: 80 mL/min/{1.73_m2} (ref >59)
GLOBULIN, TOTAL: 2.1 g/dL (ref 1.5–4.5)
GLUCOSE: 173 mg/dL — AB (ref 65–99)
Potassium: 3.8 mmol/L (ref 3.5–5.2)
Sodium: 139 mmol/L (ref 134–144)
Total Protein: 6.4 g/dL (ref 6.0–8.5)

## 2018-01-16 ENCOUNTER — Encounter: Payer: Self-pay | Admitting: Unknown Physician Specialty

## 2018-02-08 ENCOUNTER — Encounter: Payer: Self-pay | Admitting: Unknown Physician Specialty

## 2018-02-08 ENCOUNTER — Ambulatory Visit
Admission: RE | Admit: 2018-02-08 | Discharge: 2018-02-08 | Disposition: A | Discharge status: Home / Self Care | Payer: Medicare Other | Source: Ambulatory Visit | Attending: Unknown Physician Specialty | Admitting: Unknown Physician Specialty

## 2018-02-08 ENCOUNTER — Encounter

## 2018-02-08 ENCOUNTER — Ambulatory Visit (INDEPENDENT_AMBULATORY_CARE_PROVIDER_SITE_OTHER): Payer: Medicare Other | Admitting: Unknown Physician Specialty

## 2018-02-08 VITALS — BP 149/83 | HR 88 | Temp 97.8°F | Ht 62.0 in | Wt 180.8 lb

## 2018-02-08 DIAGNOSIS — J4541 Moderate persistent asthma with (acute) exacerbation: Secondary | ICD-10-CM

## 2018-02-08 DIAGNOSIS — R0602 Shortness of breath: Secondary | ICD-10-CM

## 2018-02-08 DIAGNOSIS — J029 Acute pharyngitis, unspecified: Secondary | ICD-10-CM | POA: Diagnosis not present

## 2018-02-08 DIAGNOSIS — J45901 Unspecified asthma with (acute) exacerbation: Secondary | ICD-10-CM | POA: Insufficient documentation

## 2018-02-08 DIAGNOSIS — R05 Cough: Secondary | ICD-10-CM | POA: Diagnosis not present

## 2018-02-08 NOTE — Progress Notes (Signed)
BP (!) 149/83 (BP Location: Left Arm, Cuff Size: Large)   Pulse 88   Temp 97.8 F (36.6 C) (Oral)   Ht 5\' 2"  (1.575 m)   Wt 180 lb 12.8 oz (82 kg)   LMP 07/13/2000 (Approximate)   SpO2 97%   BMI 33.07 kg/m    Subjective:    Patient ID: Charlene Juarez, female    DOB: 28-Sep-1950, 67 y.o.   MRN: [id number]  HPI: Charlene Juarez is a 67 y.o. female  Chief Complaint  Patient presents with  . URI    pt states she has had a sore throat and chest congestion for a while    Asthma  She complains of chest tightness, cough, difficulty breathing, shortness of breath and wheezing. There is no frequent throat clearing, hemoptysis, hoarse voice or sputum production. This is a recurrent problem. The problem occurs intermittently. The problem has been waxing and waning. The cough is non-productive. Her symptoms are aggravated by nothing. Her symptoms are alleviated by beta-agonist. Her past medical history is significant for asthma.     Relevant past medical, surgical, family and social history reviewed and updated as indicated. Interim medical history since our last visit reviewed. Allergies and medications reviewed and updated.  Review of Systems  HENT: Negative for hoarse voice.   Respiratory: Positive for cough, shortness of breath and wheezing. Negative for hemoptysis and sputum production.     Per HPI unless specifically indicated above     Objective:    BP (!) 149/83 (BP Location: Left Arm, Cuff Size: Large)   Pulse 88   Temp 97.8 F (36.6 C) (Oral)   Ht 5\' 2"  (1.575 m)   Wt 180 lb 12.8 oz (82 kg)   LMP 07/13/2000 (Approximate)   SpO2 97%   BMI 33.07 kg/m   Wt Readings from Last 3 Encounters:  02/08/18 180 lb 12.8 oz (82 kg)  01/13/18 180 lb 12.8 oz (82 kg)  10/14/17 179 lb 0.2 oz (81.2 kg)    Physical Exam  Results for orders placed or performed in visit on 01/13/18  Comprehensive metabolic panel  Result Value Ref Range   Glucose 173 (H) 65 - 99  mg/dL   BUN 18 8 - 27 mg/dL   Creatinine, Ser 0.87 0.57 - 1.00 mg/dL   GFR calc non Af Amer 70 >59 mL/min/1.73   GFR calc Af Amer 80 >59 mL/min/1.73   BUN/Creatinine Ratio 21 12 - 28   Sodium 139 134 - 144 mmol/L   Potassium 3.8 3.5 - 5.2 mmol/L   Chloride 99 96 - 106 mmol/L   CO2 23 20 - 29 mmol/L   Calcium 9.9 8.7 - 10.3 mg/dL   Total Protein 6.4 6.0 - 8.5 g/dL   Albumin 4.3 3.6 - 4.8 g/dL   Globulin, Total 2.1 1.5 - 4.5 g/dL   Albumin/Globulin Ratio 2.0 1.2 - 2.2   Bilirubin Total 0.4 0.0 - 1.2 mg/dL   Alkaline Phosphatase 53 39 - 117 IU/L   AST 31 0 - 40 IU/L   ALT 43 (H) 0 - 32 IU/L  Bayer DCA Hb A1c Waived  Result Value Ref Range   HB A1C (BAYER DCA - WAIVED) 6.9 <7.0 %      Assessment & Plan:   Problem List Items Addressed This Visit      Unprioritized   Asthma exacerbation    Discussed asthma trajectory and flares not unusual, especially following viral illnesses.  Will start Symbicort to take  2 puffs twice a day.  She also has a Ventolin rescue inhaler she can take prn.  Pt is concerned and we a chest x-ray.         Other Visit Diagnoses    Sore throat    -  Primary   Suspect related to cough.  Wil treat asthma.  Strep is negative   Relevant Orders   Rapid Strep Screen ([facility] & [facility] ONLY)   SOB (shortness of breath)       Relevant Orders   DG Chest 2 View       Follow up plan: Return in about 2 weeks (around 02/22/2018).

## 2018-02-08 NOTE — Assessment & Plan Note (Addendum)
Discussed asthma trajectory and flares not unusual, especially following viral illnesses.  Will start Symbicort to take 2 puffs twice a day.  She also has a Ventolin rescue inhaler she can take prn.  Pt is concerned and we a chest x-ray.

## 2018-02-09 NOTE — Progress Notes (Signed)
Pt notified through mychart.

## 2018-02-10 LAB — RAPID STREP SCREEN (MED CTR MEBANE ONLY): STREP GP A AG, IA W/REFLEX: NEGATIVE

## 2018-02-10 LAB — CULTURE, GROUP A STREP: Strep A Culture: NEGATIVE

## 2018-02-22 ENCOUNTER — Ambulatory Visit: Payer: Medicare Other | Admitting: Unknown Physician Specialty

## 2018-04-18 ENCOUNTER — Ambulatory Visit (INDEPENDENT_AMBULATORY_CARE_PROVIDER_SITE_OTHER): Payer: Medicare Other | Admitting: Unknown Physician Specialty

## 2018-04-18 ENCOUNTER — Encounter: Payer: Self-pay | Admitting: Unknown Physician Specialty

## 2018-04-18 VITALS — BP 135/85 | HR 77 | Temp 97.6°F | Ht 62.0 in | Wt 180.0 lb

## 2018-04-18 DIAGNOSIS — Z0000 Encounter for general adult medical examination without abnormal findings: Secondary | ICD-10-CM

## 2018-04-18 DIAGNOSIS — J4520 Mild intermittent asthma, uncomplicated: Secondary | ICD-10-CM | POA: Diagnosis not present

## 2018-04-18 DIAGNOSIS — E119 Type 2 diabetes mellitus without complications: Secondary | ICD-10-CM

## 2018-04-18 DIAGNOSIS — I10 Essential (primary) hypertension: Secondary | ICD-10-CM | POA: Diagnosis not present

## 2018-04-18 DIAGNOSIS — E6609 Other obesity due to excess calories: Secondary | ICD-10-CM

## 2018-04-18 DIAGNOSIS — E7800 Pure hypercholesterolemia, unspecified: Secondary | ICD-10-CM

## 2018-04-18 DIAGNOSIS — Z6832 Body mass index (BMI) 32.0-32.9, adult: Secondary | ICD-10-CM | POA: Diagnosis not present

## 2018-04-18 LAB — BAYER DCA HB A1C WAIVED: HB A1C (BAYER DCA - WAIVED): 7.6 % — ABNORMAL HIGH (ref <7.0)

## 2018-04-18 MED ORDER — ALBUTEROL SULFATE HFA 108 (90 BASE) MCG/ACT IN AERS: 2.0000 | INHALATION_SPRAY | Freq: Four times a day (QID) | RESPIRATORY_TRACT | 2 refills | Status: DC | PRN

## 2018-04-18 MED ORDER — BUDESONIDE-FORMOTEROL FUMARATE 160-4.5 MCG/ACT IN AERO: 2.0000 | INHALATION_SPRAY | Freq: Two times a day (BID) | RESPIRATORY_TRACT | 3 refills | Status: DC

## 2018-04-18 NOTE — Assessment & Plan Note (Signed)
Check lipid panel and adjust statin as needed

## 2018-04-18 NOTE — Assessment & Plan Note (Signed)
Rx Symbicort for seasonal use.  Refill Albuterol to take prn.  Currently occasional use.

## 2018-04-18 NOTE — Progress Notes (Signed)
BP 135/85 (BP Location: Left Arm, Cuff Size: Normal)   Pulse 77   Temp 97.6 F (36.4 C) (Oral)   Ht 5\' 2"  (1.575 m)   Wt 180 lb (81.6 kg)   LMP 07/13/2000 (Approximate)   SpO2 97%   BMI 32.92 kg/m    Subjective:    Patient ID: Charlene Juarez, female    DOB: Sep 04, 1951, 67 y.o.   MRN: [id number]  HPI: Charlene Juarez is a 67 y.o. female  Chief Complaint  Patient presents with  . Annual Exam    pt had wellness exam in January 2019  . Diabetes    pt states last eye exam in chart is correct   Diabetes: Using medications without difficulties No hypoglycemic episodes No hyperglycemic episodes Feet problems: none Blood Sugars averaging:not checking eye exam due and she will make appt Last Hgb A1C:6.9%  Hypertension  Using medications without difficulty Average home BPs Not checking   Using medication without problems or lightheadedness No chest pain with exertion or shortness of breath No Edema  Elevated Cholesterol Using medications without problems No Muscle aches  Diet: Exercise:  Asthma Finding she is coughing more after being outside.  Would like a refill of Symbicort to take on a seasonal basis.  Coughing at night is bothering her.    Social History   Socioeconomic History  . Marital status: Married    Spouse name: Not on file  . Number of children: Not on file  . Years of education: Not on file  . Highest education level: Not on file  Occupational History  . Not on file  Social Needs  . Financial resource strain: Not hard at all  . Food insecurity:    Worry: Never true    Inability: Never true  . Transportation needs:    Medical: No    Non-medical: No  Tobacco Use  . Smoking status: Never Smoker  . Smokeless tobacco: Never Used  Substance and Sexual Activity  . Alcohol use: No    Alcohol/week: 0.0 oz  . Drug use: No  . Sexual activity: Yes  Lifestyle  . Physical activity:    Days per week: 0 days    Minutes per  session: 0 min  . Stress: Only a little  Relationships  . Social connections:    Talks on phone: More than three times a week    Gets together: Three times a week    Attends religious service: 1 to 4 times per year    Active member of club or organization: No    Attends meetings of clubs or organizations: Never    Relationship status: Married  . Intimate partner violence:    Fear of current or ex partner: No    Emotionally abused: No    Physically abused: No    Forced sexual activity: No  Other Topics Concern  . Not on file  Social History Narrative  . Not on file   Family History  Problem Relation Age of Onset  . Alcohol abuse Mother   . Heart disease Father   . Alcohol abuse Father   . Diabetes Sister   . Hyperlipidemia Sister   . Diabetes Brother   . Hypertension Brother   . Thyroid disease Daughter   . Diabetes Maternal Grandmother   . Hypertension Sister   . Aneurysm Sister   . Breast cancer Maternal Aunt    Past Medical History:  Diagnosis Date  . Arthritis   . Bursitis   .  Cancer ([place])    SKIN  . Diabetes mellitus without complication ([place])   . GERD (gastroesophageal reflux disease)   . Hyperlipidemia   . Hypertension   . Sleep apnea    Past Surgical History:  Procedure Laterality Date  . cancer removal off nose    . CATARACT EXTRACTION W/PHACO Right 01/13/2017   Procedure: CATARACT EXTRACTION PHACO AND INTRAOCULAR LENS PLACEMENT (IOC);  Surgeon: Eulogio Bear, MD;  Location: [facility] ORS;  Service: Ophthalmology;  Laterality: Right;  [place] 31.1 AP% 0.0 CDE 1.78 Fluid Pack lot # [id number]  . CATARACT EXTRACTION W/PHACO Left 02/24/2017   Procedure: CATARACT EXTRACTION PHACO AND INTRAOCULAR LENS PLACEMENT (IOC);  Surgeon: Eulogio Bear, MD;  Location: [facility] ORS;  Service: Ophthalmology;  Laterality: Left;  [place]  00:45.5 AP  0.7 CDE   2.76  fluid pack lot # [id number]  exp. 08-19-2018  . DILATION AND CURETTAGE OF UTERUS    . TUBAL LIGATION      Relevant  past medical, surgical, family and social history reviewed and updated as indicated. Interim medical history since our last visit reviewed. Allergies and medications reviewed and updated.  Review of Systems  Constitutional: Negative.   HENT: Negative.   Eyes: Negative.   Cardiovascular: Negative.   Genitourinary: Negative.   Musculoskeletal: Negative.   Neurological: Negative.   Psychiatric/Behavioral: Negative.     Per HPI unless specifically indicated above     Objective:    BP 135/85 (BP Location: Left Arm, Cuff Size: Normal)   Pulse 77   Temp 97.6 F (36.4 C) (Oral)   Ht 5\' 2"  (1.575 m)   Wt 180 lb (81.6 kg)   LMP 07/13/2000 (Approximate)   SpO2 97%   BMI 32.92 kg/m   Wt Readings from Last 3 Encounters:  04/18/18 180 lb (81.6 kg)  02/08/18 180 lb 12.8 oz (82 kg)  01/13/18 180 lb 12.8 oz (82 kg)    Physical Exam  Constitutional: She is oriented to person, place, and time. She appears well-developed and well-nourished.  HENT:  Head: Normocephalic and atraumatic.  Eyes: Pupils are equal, round, and reactive to light. Right eye exhibits no discharge. Left eye exhibits no discharge. No scleral icterus.  Neck: Normal range of motion. Neck supple. Carotid bruit is not present. No thyromegaly present.  Cardiovascular: Normal rate, regular rhythm and normal heart sounds. Exam reveals no gallop and no friction rub.  No murmur heard. Pulmonary/Chest: Effort normal and breath sounds normal. No respiratory distress. She has no wheezes. She has no rales. No breast tenderness or discharge.  Abdominal: Soft. Bowel sounds are normal. There is no tenderness. There is no rebound.  Genitourinary: No breast tenderness or discharge.  Musculoskeletal: Normal range of motion.  Lymphadenopathy:    She has no cervical adenopathy.  Neurological: She is alert and oriented to person, place, and time.  Skin: Skin is warm, dry and intact. No rash noted.  Psychiatric: She has a normal mood and  affect. Her speech is normal and behavior is normal. Judgment and thought content normal. Cognition and memory are normal.    Results for orders placed or performed in visit on 02/08/18  Rapid Strep Screen ([facility] ONLY)  Result Value Ref Range   Strep Gp A Ag, IA W/Reflex Negative Negative  Culture, Group A Strep  Result Value Ref Range   Strep A Culture Negative       Assessment & Plan:   Problem List Items Addressed This Visit  Unprioritized   Asthma, intermittent    Rx Symbicort for seasonal use.  Refill Albuterol to take prn.  Currently occasional use.        Relevant Medications   budesonide-formoterol (SYMBICORT) 160-4.5 MCG/ACT inhaler   albuterol (PROVENTIL HFA;VENTOLIN HFA) 108 (90 Base) MCG/ACT inhaler   Diabetes (HCC) - Primary    Hgb A1C is 7.6%  It turns out she started using brown sugar in tea thinking it was healthier.  Pt education and she will work on changes regarding sugar. Recheck in 3 months without medication changes at this time      Relevant Orders   Comprehensive metabolic panel   Lipid Panel w/o Chol/HDL Ratio   Bayer DCA Hb A1c Waived   Hyperlipidemia    Check lipid panel and adjust statin as needed      Hypertension    Stable, continue present medications. Pt ed that goal BP is below 130/90       Obesity    Weight has been stable.  Discussed diet and exercise       Other Visit Diagnoses    Annual physical exam           Follow up plan: Return in about 3 months (around 07/19/2018) for DM.

## 2018-04-18 NOTE — Assessment & Plan Note (Addendum)
Stable, continue present medications. Pt ed that goal BP is below 130/90

## 2018-04-18 NOTE — Assessment & Plan Note (Signed)
Weight has been stable.  Discussed diet and exercise

## 2018-04-18 NOTE — Assessment & Plan Note (Signed)
Hgb A1C is 7.6%  It turns out she started using brown sugar in tea thinking it was healthier.  Pt education and she will work on changes regarding sugar. Recheck in 3 months without medication changes at this time

## 2018-04-19 LAB — LIPID PANEL W/O CHOL/HDL RATIO
CHOLESTEROL TOTAL: 146 mg/dL (ref 100–199)
HDL: 46 mg/dL (ref >39)
LDL Calculated: 62 mg/dL (ref 0–99)
Triglycerides: 190 mg/dL — ABNORMAL HIGH (ref 0–149)
VLDL CHOLESTEROL CAL: 38 mg/dL (ref 5–40)

## 2018-04-19 LAB — COMPREHENSIVE METABOLIC PANEL
A/G RATIO: 1.7 (ref 1.2–2.2)
ALT: 36 IU/L — AB (ref 0–32)
AST: 25 IU/L (ref 0–40)
Albumin: 4.3 g/dL (ref 3.6–4.8)
Alkaline Phosphatase: 56 IU/L (ref 39–117)
BILIRUBIN TOTAL: 0.4 mg/dL (ref 0.0–1.2)
BUN/Creatinine Ratio: 19 (ref 12–28)
BUN: 13 mg/dL (ref 8–27)
CHLORIDE: 98 mmol/L (ref 96–106)
CO2: 22 mmol/L (ref 20–29)
Calcium: 9.7 mg/dL (ref 8.7–10.3)
Creatinine, Ser: 0.69 mg/dL (ref 0.57–1.00)
GFR calc non Af Amer: 91 mL/min/{1.73_m2} (ref >59)
GFR, EST AFRICAN AMERICAN: 105 mL/min/{1.73_m2} (ref >59)
GLUCOSE: 156 mg/dL — AB (ref 65–99)
Globulin, Total: 2.5 g/dL (ref 1.5–4.5)
POTASSIUM: 3.9 mmol/L (ref 3.5–5.2)
Sodium: 138 mmol/L (ref 134–144)
Total Protein: 6.8 g/dL (ref 6.0–8.5)

## 2018-04-19 NOTE — Progress Notes (Signed)
Pt notified through mychart.

## 2018-05-10 ENCOUNTER — Other Ambulatory Visit: Payer: Self-pay | Admitting: Unknown Physician Specialty

## 2018-05-17 ENCOUNTER — Other Ambulatory Visit: Payer: Self-pay

## 2018-05-17 ENCOUNTER — Other Ambulatory Visit: Payer: Self-pay | Admitting: Unknown Physician Specialty

## 2018-05-17 MED ORDER — METRONIDAZOLE 0.75 % EX CREA: TOPICAL_CREAM | Freq: Two times a day (BID) | CUTANEOUS | 0 refills | Status: DC

## 2018-05-17 NOTE — Telephone Encounter (Signed)
Left message to call back and discuss if she is having symptoms and is needing an office visit.

## 2018-06-21 MED ORDER — GLUCOSE BLOOD VI STRP: ORAL_STRIP | 12 refills | Status: DC

## 2018-06-22 ENCOUNTER — Other Ambulatory Visit: Payer: Self-pay | Admitting: Unknown Physician Specialty

## 2018-06-22 DIAGNOSIS — Z1231 Encounter for screening mammogram for malignant neoplasm of breast: Secondary | ICD-10-CM

## 2018-07-05 ENCOUNTER — Other Ambulatory Visit: Payer: Self-pay | Admitting: Unknown Physician Specialty

## 2018-07-05 NOTE — Telephone Encounter (Signed)
Requested Prescriptions  Pending Prescriptions Disp Refills  . losartan-hydrochlorothiazide (HYZAAR) 100-25 MG tablet [Pharmacy Med Name: LOSARTAN-HCTZ 100-25 MG TAB] 90 tablet 1    Sig: TAKE 1 TABLET BY MOUTH EVERY DAY     Cardiovascular: ARB + Diuretic Combos Passed - 07/05/2018  1:49 AM      Passed - K in normal range and within 180 days    Potassium  Date Value Ref Range Status  04/18/2018 3.9 3.5 - 5.2 mmol/L Final         Passed - Na in normal range and within 180 days    Sodium  Date Value Ref Range Status  04/18/2018 138 134 - 144 mmol/L Final         Passed - Cr in normal range and within 180 days    Creatinine, Ser  Date Value Ref Range Status  04/18/2018 0.69 0.57 - 1.00 mg/dL Final         Passed - Ca in normal range and within 180 days    Calcium  Date Value Ref Range Status  04/18/2018 9.7 8.7 - 10.3 mg/dL Final         Passed - Patient is not pregnant      Passed - Last BP in normal range    BP Readings from Last 1 Encounters:  04/18/18 135/85         Passed - Valid encounter within last 6 months    Recent Outpatient Visits          2 months ago Type 2 diabetes mellitus without complication, without long-term current use of insulin ([place])   [facility] Kathrine Haddock, NP   4 months ago Sore throat   [place] Kathrine Haddock, NP   5 months ago Type 2 diabetes mellitus without complication, without long-term current use of insulin ([place])   [place] Kathrine Haddock, NP   8 months ago Essential hypertension   [facility] Kathrine Haddock, NP   11 months ago Need for influenza vaccination   [facility] Kathrine Haddock, NP      Future Appointments            In 2 weeks Cannady, Barbaraann Faster, NP [facility]   In 3 months  [facility]

## 2018-07-07 ENCOUNTER — Other Ambulatory Visit: Payer: Self-pay | Admitting: Unknown Physician Specialty

## 2018-07-11 ENCOUNTER — Other Ambulatory Visit: Payer: Self-pay | Admitting: Unknown Physician Specialty

## 2018-07-13 ENCOUNTER — Other Ambulatory Visit: Payer: Self-pay | Admitting: Nurse Practitioner

## 2018-07-14 ENCOUNTER — Other Ambulatory Visit: Payer: Self-pay | Admitting: Nurse Practitioner

## 2018-07-14 MED ORDER — LOSARTAN POTASSIUM 100 MG PO TABS: 100.0000 mg | ORAL_TABLET | Freq: Every day | ORAL | 3 refills | Status: DC

## 2018-07-14 MED ORDER — HYDROCHLOROTHIAZIDE 25 MG PO TABS: 25.0000 mg | ORAL_TABLET | Freq: Every day | ORAL | 3 refills | Status: DC

## 2018-07-14 NOTE — Progress Notes (Signed)
Losartan-HCTZ tablets recalled at this time.  Per pharmacy previous recommendations will have to order medications as two separate pills until recall lifted.  Order for Losartan 100MG  and HCTZ 25MG  tablet placed.  Once recall lifted will return to combo pill.  Prescriptions sent to [facility] in [place].

## 2018-07-14 NOTE — Telephone Encounter (Signed)
I have [facility] [place] for patient.  Due to recall the pharmacies recommendations lately have been to prescribe as two separate pills.  So I have ordered each medication separately.  Same doses, patient will have to take two separate pills until recall is lifter though.  Please let he know this has been sent to [place].

## 2018-07-14 NOTE — Telephone Encounter (Signed)
Routing to close.  °

## 2018-07-19 ENCOUNTER — Encounter: Payer: Self-pay | Admitting: Nurse Practitioner

## 2018-07-19 ENCOUNTER — Other Ambulatory Visit: Payer: Self-pay

## 2018-07-19 ENCOUNTER — Ambulatory Visit (INDEPENDENT_AMBULATORY_CARE_PROVIDER_SITE_OTHER): Payer: Medicare Other | Admitting: Nurse Practitioner

## 2018-07-19 VITALS — BP 136/77 | HR 74 | Temp 97.7°F | Ht 62.0 in | Wt 177.0 lb

## 2018-07-19 DIAGNOSIS — I10 Essential (primary) hypertension: Secondary | ICD-10-CM | POA: Diagnosis not present

## 2018-07-19 DIAGNOSIS — E119 Type 2 diabetes mellitus without complications: Secondary | ICD-10-CM

## 2018-07-19 MED ORDER — ONDANSETRON HCL 4 MG PO TABS: 4.0000 mg | ORAL_TABLET | Freq: Three times a day (TID) | ORAL | 1 refills | Status: DC | PRN

## 2018-07-19 NOTE — Assessment & Plan Note (Addendum)
Chronic.  Has not been taking HCTZ d/t recent recall and having to take two separate meds vs Losartan-HCTZ combo.  BP slightly above goal today . Encouraged her to take HCTZ as ordered and discussed recent recall.

## 2018-07-19 NOTE — Progress Notes (Signed)
BP 136/77   Pulse 74   Temp 97.7 F (36.5 C) (Oral)   Ht 5\' 2"  (1.575 m)   Wt 177 lb (80.3 kg)   LMP 07/13/2000 (Approximate)   SpO2 96%   BMI 32.37 kg/m    Subjective:    Patient ID: Charlene Juarez, female    DOB: 03-25-1951, 67 y.o.   MRN: [id number]  HPI: Charlene Juarez is a 67 y.o. female presents for HTN and T2DM f/u  Chief Complaint  Patient presents with  . Diabetes    3 m f/u pt states the ozempic is making her sick, nausea after she takes it   DIABETES Continues on Ozempic, but has some nausea occasionally with it.  States she does not notice it all the time and would prefer not to stop medication.  Inquired into whether she could get script for Zofran, as her sister uses this as needed and is on same regimen.   Hypoglycemic episodes:no Polydipsia/polyuria: no Visual disturbance: no Chest pain: no Paresthesias: no Glucose Monitoring: yes  Accucheck frequency: once or twice a week  Fasting glucose:  Post prandial:  Evening: 140-198 (198 from yesterday ate a No No and lemon pie)  Before meals: Taking Insulin?: no  Long acting insulin:  Short acting insulin: Blood Pressure Monitoring: not checking Retinal Examination: Not up to date Foot Exam: Up to Date Diabetic Education: Completed Pneumovax: Up to Date Influenza: Not up to Date, is coming next week for flu shot Aspirin: no   HYPERTENSION Hypertension status: stable  Satisfied with current treatment? yes Duration of hypertension: chronic BP monitoring frequency:  not checking BP range:  BP medication side effects:  no Medication compliance: excellent compliance Previous BP meds:none Aspirin: no Recurrent headaches: no Visual changes: no Palpitations: no Dyspnea: no Chest pain: no Lower extremity edema: no Dizzy/lightheaded: no  Relevant past medical, surgical, family and social history reviewed and updated as indicated. Interim medical history since our last visit  reviewed. Allergies and medications reviewed and updated.  Review of Systems  Constitutional: Negative for activity change, appetite change, fatigue and unexpected weight change.  Respiratory: Negative for cough, chest tightness, shortness of breath and wheezing.   Cardiovascular: Negative for chest pain, palpitations and leg swelling.  Gastrointestinal: Positive for nausea. Negative for abdominal distention, abdominal pain, constipation, diarrhea and vomiting.  Endocrine: Negative for cold intolerance, heat intolerance, polydipsia, polyphagia and polyuria.  Musculoskeletal: Negative.   Skin: Negative.   Neurological: Negative for dizziness, tremors, syncope, weakness, light-headedness, numbness and headaches.  Psychiatric/Behavioral: Negative.      Per HPI unless specifically indicated above     Objective:    BP 136/77   Pulse 74   Temp 97.7 F (36.5 C) (Oral)   Ht 5\' 2"  (1.575 m)   Wt 177 lb (80.3 kg)   LMP 07/13/2000 (Approximate)   SpO2 96%   BMI 32.37 kg/m   Wt Readings from Last 3 Encounters:  07/19/18 177 lb (80.3 kg)  04/18/18 180 lb (81.6 kg)  02/08/18 180 lb 12.8 oz (82 kg)    Physical Exam  Constitutional: She is oriented to person, place, and time. She appears well-developed and well-nourished.  HENT:  Head: Normocephalic and atraumatic.  Eyes: Pupils are equal, round, and reactive to light. Conjunctivae and EOM are normal. Right eye exhibits no discharge. Left eye exhibits no discharge.  Neck: Normal range of motion. Neck supple. No JVD present. Carotid bruit is not present. No thyromegaly present.  Cardiovascular: Normal  rate, regular rhythm, normal heart sounds and intact distal pulses.  Pulses:      Dorsalis pedis pulses are 2+ on the right side, and 2+ on the left side.       Posterior tibial pulses are 2+ on the right side, and 2+ on the left side.  Pulmonary/Chest: Effort normal and breath sounds normal.  Abdominal: Soft. Bowel sounds are normal.    Musculoskeletal: Normal range of motion.       Right foot: There is no deformity.       Left foot: There is no deformity.  Feet:  Right Foot:  Protective Sensation: 10 sites tested. 10 sites sensed.  Skin Integrity: Positive for dry skin.  Left Foot:  Protective Sensation: 10 sites tested. 10 sites sensed.  Skin Integrity: Positive for dry skin.  Lymphadenopathy:    She has no cervical adenopathy.  Neurological: She is alert and oriented to person, place, and time. She has normal reflexes.  Skin: Skin is warm and dry.  Psychiatric: She has a normal mood and affect. Her behavior is normal.   Diabetic Foot Exam - Simple   Simple Foot Form Visual Inspection No deformities, no ulcerations, no other skin breakdown bilaterally:  Yes Sensation Testing Intact to touch and monofilament testing bilaterally:  Yes Pulse Check Posterior Tibialis and Dorsalis pulse intact bilaterally:  Yes Comments     Results for orders placed or performed in visit on 04/18/18  Comprehensive metabolic panel  Result Value Ref Range   Glucose 156 (H) 65 - 99 mg/dL   BUN 13 8 - 27 mg/dL   Creatinine, Ser 0.69 0.57 - 1.00 mg/dL   GFR calc non Af Amer 91 >59 mL/min/1.73   GFR calc Af Amer 105 >59 mL/min/1.73   BUN/Creatinine Ratio 19 12 - 28   Sodium 138 134 - 144 mmol/L   Potassium 3.9 3.5 - 5.2 mmol/L   Chloride 98 96 - 106 mmol/L   CO2 22 20 - 29 mmol/L   Calcium 9.7 8.7 - 10.3 mg/dL   Total Protein 6.8 6.0 - 8.5 g/dL   Albumin 4.3 3.6 - 4.8 g/dL   Globulin, Total 2.5 1.5 - 4.5 g/dL   Albumin/Globulin Ratio 1.7 1.2 - 2.2   Bilirubin Total 0.4 0.0 - 1.2 mg/dL   Alkaline Phosphatase 56 39 - 117 IU/L   AST 25 0 - 40 IU/L   ALT 36 (H) 0 - 32 IU/L  Lipid Panel w/o Chol/HDL Ratio  Result Value Ref Range   Cholesterol, Total 146 100 - 199 mg/dL   Triglycerides 190 (H) 0 - 149 mg/dL   HDL 46 >39 mg/dL   VLDL Cholesterol Cal 38 5 - 40 mg/dL   LDL Calculated 62 0 - 99 mg/dL  Bayer DCA Hb A1c Waived   Result Value Ref Range   HB A1C (BAYER DCA - WAIVED) 7.6 (H) <7.0 %      Assessment & Plan:   Problem List Items Addressed This Visit      Cardiovascular and Mediastinum   Essential hypertension    Chronic.  Has not been taking HCTZ d/t recent recall and having to take two separate meds vs Losartan-HCTZ combo.  BP slightly above goal today . Encouraged her to take HCTZ as ordered and discussed recent recall.         Relevant Orders   TSH     Endocrine   Type 2 diabetes mellitus without complications ([place]) - Primary    Chronic.  Last A1C 7.6%.  A1C done today, pt asked to be called with results as has errands to run.  She endorses not always following diabetic diet.  Educated her on when to check FSBS at home and to document these.  Recheck A1C in 3 months.  Zofran as needed for nausea.      Relevant Orders   HgB A1c       Follow up plan: Return in about 3 months (around 10/19/2018) for T2DM and HTN (needs A1C).

## 2018-07-19 NOTE — Patient Instructions (Signed)
Diabetes Mellitus and Nutrition When you have diabetes (diabetes mellitus), it is very important to have healthy eating habits because your blood sugar (glucose) levels are greatly affected by what you eat and drink. Eating healthy foods in the appropriate amounts, at about the same times every day, can help you:  Control your blood glucose.  Lower your risk of heart disease.  Improve your blood pressure.  Reach or maintain a healthy weight.  Every person with diabetes is different, and each person has different needs for a meal plan. Your health care provider may recommend that you work with a diet and nutrition specialist (dietitian) to make a meal plan that is best for you. Your meal plan may vary depending on factors such as:  The calories you need.  The medicines you take.  Your weight.  Your blood glucose, blood pressure, and cholesterol levels.  Your activity level.  Other health conditions you have, such as heart or kidney disease.  How do carbohydrates affect me? Carbohydrates affect your blood glucose level more than any other type of food. Eating carbohydrates naturally increases the amount of glucose in your blood. Carbohydrate counting is a method for keeping track of how many carbohydrates you eat. Counting carbohydrates is important to keep your blood glucose at a healthy level, especially if you use insulin or take certain oral diabetes medicines. It is important to know how many carbohydrates you can safely have in each meal. This is different for every person. Your dietitian can help you calculate how many carbohydrates you should have at each meal and for snack. Foods that contain carbohydrates include:  Bread, cereal, rice, pasta, and crackers.  Potatoes and corn.  Peas, beans, and lentils.  Milk and yogurt.  Fruit and juice.  Desserts, such as cakes, cookies, ice cream, and candy.  How does alcohol affect me? Alcohol can cause a sudden decrease in blood  glucose (hypoglycemia), especially if you use insulin or take certain oral diabetes medicines. Hypoglycemia can be a life-threatening condition. Symptoms of hypoglycemia (sleepiness, dizziness, and confusion) are similar to symptoms of having too much alcohol. If your health care provider says that alcohol is safe for you, follow these guidelines:  Limit alcohol intake to no more than 1 drink per day for nonpregnant women and 2 drinks per day for men. One drink equals 12 oz of beer, 5 oz of wine, or 1 oz of hard liquor.  Do not drink on an empty stomach.  Keep yourself hydrated with water, diet soda, or unsweetened iced tea.  Keep in mind that regular soda, juice, and other mixers may contain a lot of sugar and must be counted as carbohydrates.  What are tips for following this plan? Reading food labels  Start by checking the serving size on the label. The amount of calories, carbohydrates, fats, and other nutrients listed on the label are based on one serving of the food. Many foods contain more than one serving per package.  Check the total grams (g) of carbohydrates in one serving. You can calculate the number of servings of carbohydrates in one serving by dividing the total carbohydrates by 15. For example, if a food has 30 g of total carbohydrates, it would be equal to 2 servings of carbohydrates.  Check the number of grams (g) of saturated and trans fats in one serving. Choose foods that have low or no amount of these fats.  Check the number of milligrams (mg) of sodium in one serving. Most people   should limit total sodium intake to less than 2,300 mg per day.  Always check the nutrition information of foods labeled as "low-fat" or "nonfat". These foods may be higher in added sugar or refined carbohydrates and should be avoided.  Talk to your dietitian to identify your daily goals for nutrients listed on the label. Shopping  Avoid buying canned, premade, or processed foods. These  foods tend to be high in fat, sodium, and added sugar.  Shop around the outside edge of the grocery store. This includes fresh fruits and vegetables, bulk grains, fresh meats, and fresh dairy. Cooking  Use low-heat cooking methods, such as baking, instead of high-heat cooking methods like deep frying.  Cook using healthy oils, such as olive, canola, or sunflower oil.  Avoid cooking with butter, cream, or high-fat meats. Meal planning  Eat meals and snacks regularly, preferably at the same times every day. Avoid going long periods of time without eating.  Eat foods high in fiber, such as fresh fruits, vegetables, beans, and whole grains. Talk to your dietitian about how many servings of carbohydrates you can eat at each meal.  Eat 4-6 ounces of lean protein each day, such as lean meat, chicken, fish, eggs, or tofu. 1 ounce is equal to 1 ounce of meat, chicken, or fish, 1 egg, or 1/4 cup of tofu.  Eat some foods each day that contain healthy fats, such as avocado, nuts, seeds, and fish. Lifestyle   Check your blood glucose regularly.  Exercise at least 30 minutes 5 or more days each week, or as told by your health care provider.  Take medicines as told by your health care provider.  Do not use any products that contain nicotine or tobacco, such as cigarettes and e-cigarettes. If you need help quitting, ask your health care provider.  Work with a counselor or diabetes educator to identify strategies to manage stress and any emotional and social challenges. What are some questions to ask my health care provider?  Do I need to meet with a diabetes educator?  Do I need to meet with a dietitian?  What number can I call if I have questions?  When are the best times to check my blood glucose? Where to find more information:  American Diabetes Association: diabetes.org/food-and-fitness/food  Academy of Nutrition and Dietetics:  www.eatright.org/resources/health/diseases-and-conditions/diabetes  National Institute of Diabetes and Digestive and Kidney Diseases (NIH): www.niddk.nih.gov/health-information/diabetes/overview/diet-eating-physical-activity Summary  A healthy meal plan will help you control your blood glucose and maintain a healthy lifestyle.  Working with a diet and nutrition specialist (dietitian) can help you make a meal plan that is best for you.  Keep in mind that carbohydrates and alcohol have immediate effects on your blood glucose levels. It is important to count carbohydrates and to use alcohol carefully. This information is not intended to replace advice given to you by your health care provider. Make sure you discuss any questions you have with your health care provider. Document Released: 06/03/2005 Document Revised: 10/11/2016 Document Reviewed: 10/11/2016 Elsevier Interactive Patient Education  2018 Elsevier Inc.  

## 2018-07-19 NOTE — Assessment & Plan Note (Addendum)
Chronic.  Last A1C 7.6%.  A1C done today, pt asked to be called with results as has errands to run.  She endorses not always following diabetic diet.  Educated her on when to check FSBS at home and to document these.  Recheck A1C in 3 months.  Zofran as needed for nausea.

## 2018-07-20 ENCOUNTER — Telehealth: Payer: Self-pay | Admitting: Nurse Practitioner

## 2018-07-20 LAB — HEMOGLOBIN A1C
ESTIMATED AVERAGE GLUCOSE: 186 mg/dL
HEMOGLOBIN A1C: 8.1 % — AB (ref 4.8–5.6)

## 2018-07-20 LAB — TSH: TSH: 3.48 u[IU]/mL (ref 0.450–4.500)

## 2018-07-20 NOTE — Telephone Encounter (Signed)
Spoke to patient via telephone to discuss A1C result, 8.1% on labs reported this morning.  Previous was 7.6% on labs three months ago.  She is on max doses Metformin, Ozempic, and Jardiance.  Has tried multiple medications in past, including DPP4 meds.  Will focus on diet over next three months, which she endorses she has not been doing well on, and if continued elevation may consider small dose of Glipizide.  Has taken this in past and reported it "brought me down".  Discussed risks of hypoglycemia with medication and preference to try diet changes first.  Discussed case with Dr. Wynetta Emery.

## 2018-07-25 ENCOUNTER — Ambulatory Visit
Admission: RE | Admit: 2018-07-25 | Discharge: 2018-07-25 | Disposition: A | Discharge status: Home / Self Care | Payer: Medicare Other | Source: Ambulatory Visit | Attending: Unknown Physician Specialty | Admitting: Unknown Physician Specialty

## 2018-07-25 DIAGNOSIS — Z1231 Encounter for screening mammogram for malignant neoplasm of breast: Secondary | ICD-10-CM | POA: Diagnosis not present

## 2018-07-27 ENCOUNTER — Ambulatory Visit (INDEPENDENT_AMBULATORY_CARE_PROVIDER_SITE_OTHER): Payer: Medicare Other

## 2018-07-27 DIAGNOSIS — Z23 Encounter for immunization: Secondary | ICD-10-CM

## 2018-09-02 ENCOUNTER — Other Ambulatory Visit: Payer: Self-pay | Admitting: Unknown Physician Specialty

## 2018-09-27 ENCOUNTER — Other Ambulatory Visit: Payer: Self-pay | Admitting: Unknown Physician Specialty

## 2018-10-05 ENCOUNTER — Ambulatory Visit
Admission: RE | Admit: 2018-10-05 | Discharge: 2018-10-05 | Disposition: A | Discharge status: Home / Self Care | Payer: Medicare Other | Source: Ambulatory Visit | Attending: Nurse Practitioner | Admitting: Nurse Practitioner

## 2018-10-05 ENCOUNTER — Ambulatory Visit (INDEPENDENT_AMBULATORY_CARE_PROVIDER_SITE_OTHER): Payer: Medicare Other | Admitting: Nurse Practitioner

## 2018-10-05 ENCOUNTER — Encounter: Payer: Self-pay | Admitting: Nurse Practitioner

## 2018-10-05 ENCOUNTER — Other Ambulatory Visit: Payer: Self-pay | Admitting: Unknown Physician Specialty

## 2018-10-05 VITALS — BP 143/85 | HR 84 | Temp 97.6°F | Wt 180.4 lb

## 2018-10-05 DIAGNOSIS — R05 Cough: Secondary | ICD-10-CM | POA: Diagnosis not present

## 2018-10-05 DIAGNOSIS — J0110 Acute frontal sinusitis, unspecified: Secondary | ICD-10-CM | POA: Insufficient documentation

## 2018-10-05 MED ORDER — BENZONATATE 200 MG PO CAPS: 200.0000 mg | ORAL_CAPSULE | Freq: Two times a day (BID) | ORAL | 0 refills | Status: DC | PRN

## 2018-10-05 MED ORDER — AMOXICILLIN-POT CLAVULANATE 875-125 MG PO TABS: 1.0000 | ORAL_TABLET | Freq: Two times a day (BID) | ORAL | 0 refills | Status: DC

## 2018-10-05 NOTE — Progress Notes (Addendum)
BP (!) 143/85 (BP Location: Left Arm, Cuff Size: Normal)   Pulse 84   Temp 97.6 F (36.4 C) (Oral)   Wt 180 lb 6.4 oz (81.8 kg)   LMP 07/13/2000 (Approximate)   SpO2 97%   BMI 33.00 kg/m    Subjective:    Patient ID: Charlene Juarez, female    DOB: Mar 05, 1951, 68 y.o.   MRN: [id number]  HPI: Marrisa Juarez is a 68 y.o. female presents for acute visit  Chief Complaint  Patient presents with  . URI    pt states she has had congestion, sinus pressure, ear ache, cough, and colored phlegm. States this has been going on for a couple of weeks.    UPPER RESPIRATORY TRACT INFECTION Has had symptoms for a couple weeks and they are worsening.  Has not been using Symbicort daily at baseline (she is to use if BID) and reports has been using Albuterol every 6 hours since she has been sick. Worst symptom: sinus pressure Fever: no Cough: yes Shortness of breath: no Wheezing: no Chest pain: no Chest tightness: yes Chest congestion: yes Nasal congestion: yes Runny nose: no Post nasal drip: yes Sneezing: no Sore throat: no Swollen glands: no Sinus pressure: yes Headache: yes Face pain: yes Toothache: no Ear pain: none Ear pressure: yes left Eyes red/itching:no Eye drainage/crusting: no  Vomiting: no Rash: no Fatigue: yes Sick contacts: yes Strep contacts: no  Context: worse Recurrent sinusitis: no Relief with OTC cold/cough medications: no  Treatments attempted: cold/sinus   Relevant past medical, surgical, family and social history reviewed and updated as indicated. Interim medical history since our last visit reviewed. Allergies and medications reviewed and updated.  Review of Systems  Constitutional: Positive for fatigue. Negative for activity change, appetite change, diaphoresis and fever.  HENT: Positive for congestion, postnasal drip, rhinorrhea, sinus pressure and sinus pain. Negative for ear discharge, ear pain, facial swelling, sneezing, sore  throat and voice change.   Eyes: Negative for pain and visual disturbance.  Respiratory: Positive for cough and chest tightness. Negative for shortness of breath and wheezing.   Cardiovascular: Negative for chest pain, palpitations and leg swelling.  Gastrointestinal: Negative for abdominal distention, abdominal pain, constipation, diarrhea, nausea and vomiting.  Endocrine: Negative.   Musculoskeletal: Negative for myalgias.  Neurological: Negative for dizziness, numbness and headaches.  Psychiatric/Behavioral: Negative.     Per HPI unless specifically indicated above     Objective:    BP (!) 143/85 (BP Location: Left Arm, Cuff Size: Normal)   Pulse 84   Temp 97.6 F (36.4 C) (Oral)   Wt 180 lb 6.4 oz (81.8 kg)   LMP 07/13/2000 (Approximate)   SpO2 97%   BMI 33.00 kg/m   Wt Readings from Last 3 Encounters:  10/05/18 180 lb 6.4 oz (81.8 kg)  07/19/18 177 lb (80.3 kg)  04/18/18 180 lb (81.6 kg)    Physical Exam Vitals signs and nursing note reviewed.  Constitutional:      General: She is awake.     Appearance: She is well-developed and overweight. She is ill-appearing.  HENT:     Head: Normocephalic. No raccoon eyes.     Right Ear: Hearing, ear canal and external ear normal. A middle ear effusion is present.     Left Ear: Hearing and ear canal normal. A middle ear effusion is present.     Nose: Mucosal edema and rhinorrhea present. Rhinorrhea is clear.     Right Sinus: Frontal sinus tenderness present.  No maxillary sinus tenderness.     Left Sinus: Frontal sinus tenderness present. No maxillary sinus tenderness.     Mouth/Throat:     Mouth: Mucous membranes are moist.     Pharynx: Posterior oropharyngeal erythema (mild with cobblestoning) present. No pharyngeal swelling or oropharyngeal exudate.     Tonsils: Swelling: 0 on the right. 0 on the left.  Eyes:     General: Lids are normal.        Right eye: No discharge.        Left eye: No discharge.      Conjunctiva/sclera: Conjunctivae normal.     Pupils: Pupils are equal, round, and reactive to light.  Neck:     Musculoskeletal: Normal range of motion and neck supple.     Thyroid: No thyromegaly.     Vascular: No carotid bruit or JVD.  Cardiovascular:     Rate and Rhythm: Normal rate and regular rhythm.     Heart sounds: Normal heart sounds. No murmur. No gallop.   Pulmonary:     Effort: Pulmonary effort is normal.     Breath sounds: Examination of the right-upper field reveals wheezing. Examination of the left-upper field reveals wheezing. Examination of the right-lower field reveals wheezing. Examination of the left-lower field reveals wheezing. Wheezing present.     Comments: Intermittent expiratory wheezes noted throughout.  No rhonchi.  Intermittent nonproductive cough present. Abdominal:     General: Bowel sounds are normal.     Palpations: Abdomen is soft. There is no hepatomegaly or splenomegaly.  Musculoskeletal:     Right lower leg: No edema.     Left lower leg: No edema.  Lymphadenopathy:     Head:     Right side of head: Submandibular adenopathy present. No submental, tonsillar or preauricular adenopathy.     Left side of head: Submandibular adenopathy present. No submental, tonsillar or preauricular adenopathy.     Cervical: No cervical adenopathy.  Skin:    General: Skin is warm and dry.  Neurological:     Mental Status: She is alert and oriented to person, place, and time.  Psychiatric:        Attention and Perception: Attention normal.        Mood and Affect: Mood normal.        Behavior: Behavior normal. Behavior is cooperative.        Thought Content: Thought content normal.        Judgment: Judgment normal.     Results for orders placed or performed in visit on 07/19/18  HgB A1c  Result Value Ref Range   Hgb A1c MFr Bld 8.1 (H) 4.8 - 5.6 %   Est. average glucose Bld gHb Est-mCnc 186 mg/dL  TSH  Result Value Ref Range   TSH 3.480 0.450 - 4.500 uIU/mL       Assessment & Plan:   Problem List Items Addressed This Visit      Respiratory   Acute frontal sinusitis - Primary    Acute with worsening symptoms.  Will obtain CXR based on length of cough and adventitious sounds.  Script for Augmentin and Tessalon sent.  Recommended patient use her Symbicort as ordered 2 puffs BID and continue to use her Albuterol as needed during this acute phase.  No oral Prednisone at this time, as patient does report slight elevation in BS at home.  Recommend Coricidin vs other cold medication for symptom control (d/t HTN).  Increase fluid intake at home and rest.  Has follow-up scheduled 10/19/18.      Relevant Medications   amoxicillin-clavulanate (AUGMENTIN) 875-125 MG tablet   benzonatate (TESSALON) 200 MG capsule   Other Relevant Orders   DG Chest 2 View       Follow up plan: Return if symptoms worsen or fail to improve, for has appt on 10/19/2018.

## 2018-10-05 NOTE — Assessment & Plan Note (Signed)
Acute with worsening symptoms.  Will obtain CXR based on length of cough and adventitious sounds.  Script for Augmentin and Tessalon sent.  Recommended patient use her Symbicort as ordered 2 puffs BID and continue to use her Albuterol as needed during this acute phase.  No oral Prednisone at this time, as patient does report slight elevation in BS at home.  Recommend Coricidin vs other cold medication for symptom control (d/t HTN).  Increase fluid intake at home and rest.  Has follow-up scheduled 10/19/18.

## 2018-10-05 NOTE — Patient Instructions (Signed)
Acute Bronchitis, Adult Acute bronchitis is when air tubes (bronchi) in the lungs suddenly get swollen. The condition can make it hard to breathe. It can also cause these symptoms:  A cough.  Coughing up clear, yellow, or green mucus.  Wheezing.  Chest congestion.  Shortness of breath.  A fever.  Body aches.  Chills.  A sore throat. Follow these instructions at home:  Medicines  Take over-the-counter and prescription medicines only as told by your doctor.  If you were prescribed an antibiotic medicine, take it as told by your doctor. Do not stop taking the antibiotic even if you start to feel better. General instructions  Rest.  Drink enough fluids to keep your pee (urine) pale yellow.  Avoid smoking and secondhand smoke. If you smoke and you need help quitting, ask your doctor. Quitting will help your lungs heal faster.  Use an inhaler, cool mist vaporizer, or humidifier as told by your doctor.  Keep all follow-up visits as told by your doctor. This is important. How is this prevented? To lower your risk of getting this condition again:  Wash your hands often with soap and water. If you cannot use soap and water, use hand sanitizer.  Avoid contact with people who have cold symptoms.  Try not to touch your hands to your mouth, nose, or eyes.  Make sure to get the flu shot every year. Contact a doctor if:  Your symptoms do not get better in 2 weeks. Get help right away if:  You cough up blood.  You have chest pain.  You have very bad shortness of breath.  You become dehydrated.  You faint (pass out) or keep feeling like you are going to pass out.  You keep throwing up (vomiting).  You have a very bad headache.  Your fever or chills gets worse. This information is not intended to replace advice given to you by your health care provider. Make sure you discuss any questions you have with your health care provider. Document Released: 02/23/2008 Document  Revised: 04/20/2017 Document Reviewed: 02/25/2016 Elsevier Interactive Patient Education  2019 Elsevier Inc.  

## 2018-10-16 ENCOUNTER — Ambulatory Visit: Payer: Self-pay

## 2018-10-16 ENCOUNTER — Telehealth: Payer: Self-pay | Admitting: Nurse Practitioner

## 2018-10-16 MED ORDER — PREDNISONE 10 MG PO TABS: 30.0000 mg | ORAL_TABLET | Freq: Every day | ORAL | 0 refills | Status: AC

## 2018-10-16 NOTE — Telephone Encounter (Signed)
Patient complaints of ongoing cough, states it is better but not 100%.  She has finished her abx regimen, finished yesterday.  States she continues ot have intermittent wheezing and some green sputum.  We had held off on Prednisone during initial visit d/t her concerns about her sugar.  She agrees to a short burst at this time of Prednisone.  Will send this into pharmacy.  She is to monitor sugars closely and has follow-up in office on Thursday to see provider.  To return to office immediately if increase symptoms or go to ER/urgent care

## 2018-10-16 NOTE — Telephone Encounter (Signed)
Pt. Reports she has been seen this month and put on antibiotic. Finished it recently and continues to have a cough. Productive with green mucus. A little "bit of a runny nose." Some wheezing. States she would like a refill of the antibiotic. Has an appointment this week. Uses [facility] in [place]. Please advise pt.  Answer Assessment - Initial Assessment Questions 1. ONSET: "When did the cough begin?"      Started after Christmas 2. SEVERITY: "How bad is the cough today?"      Moderate 3. RESPIRATORY DISTRESS: "Describe your breathing."      A little shortness of breath with exertion 4. FEVER: "Do you have a fever?" If so, ask: "What is your temperature, how was it measured, and when did it start?"     No  5. SPUTUM: "Describe the color of your sputum" (clear, white, yellow, green)     Greenish 6. HEMOPTYSIS: "Are you coughing up any blood?" If so ask: "How much?" (flecks, streaks, tablespoons, etc.)     No 7. CARDIAC HISTORY: "Do you have any history of heart disease?" (e.g., heart attack, congestive heart failure)      HTN 8. LUNG HISTORY: "Do you have any history of lung disease?"  (e.g., pulmonary embolus, asthma, emphysema)     Asthma 9. PE RISK FACTORS: "Do you have a history of blood clots?" (or: recent major surgery, recent prolonged travel, bedridden)     No 10. OTHER SYMPTOMS: "Do you have any other symptoms?" (e.g., runny nose, wheezing, chest pain)       Wheezing, runny nose 11. PREGNANCY: "Is there any chance you are pregnant?" "When was your last menstrual period?"       No 12. TRAVEL: "Have you traveled out of the country in the last month?" (e.g., travel history, exposures)       No  Protocols used: [place]

## 2018-10-16 NOTE — Telephone Encounter (Signed)
Spoke to patient via telephone

## 2018-10-19 ENCOUNTER — Other Ambulatory Visit: Payer: Self-pay

## 2018-10-19 ENCOUNTER — Ambulatory Visit (INDEPENDENT_AMBULATORY_CARE_PROVIDER_SITE_OTHER): Payer: Medicare Other | Admitting: Nurse Practitioner

## 2018-10-19 ENCOUNTER — Encounter: Payer: Self-pay | Admitting: Nurse Practitioner

## 2018-10-19 ENCOUNTER — Ambulatory Visit (INDEPENDENT_AMBULATORY_CARE_PROVIDER_SITE_OTHER): Payer: Medicare Other

## 2018-10-19 VITALS — BP 134/78 | HR 86 | Temp 97.9°F | Ht 62.0 in | Wt 178.0 lb

## 2018-10-19 DIAGNOSIS — E7800 Pure hypercholesterolemia, unspecified: Secondary | ICD-10-CM | POA: Diagnosis not present

## 2018-10-19 DIAGNOSIS — J4520 Mild intermittent asthma, uncomplicated: Secondary | ICD-10-CM

## 2018-10-19 DIAGNOSIS — E538 Deficiency of other specified B group vitamins: Secondary | ICD-10-CM | POA: Diagnosis not present

## 2018-10-19 DIAGNOSIS — E559 Vitamin D deficiency, unspecified: Secondary | ICD-10-CM | POA: Diagnosis not present

## 2018-10-19 DIAGNOSIS — I10 Essential (primary) hypertension: Secondary | ICD-10-CM | POA: Diagnosis not present

## 2018-10-19 DIAGNOSIS — E2839 Other primary ovarian failure: Secondary | ICD-10-CM | POA: Diagnosis not present

## 2018-10-19 DIAGNOSIS — Z0000 Encounter for general adult medical examination without abnormal findings: Secondary | ICD-10-CM | POA: Diagnosis not present

## 2018-10-19 DIAGNOSIS — E119 Type 2 diabetes mellitus without complications: Secondary | ICD-10-CM | POA: Diagnosis not present

## 2018-10-19 LAB — BAYER DCA HB A1C WAIVED: HB A1C (BAYER DCA - WAIVED): 7.6 % — ABNORMAL HIGH (ref <7.0)

## 2018-10-19 NOTE — Patient Instructions (Signed)
Montelukast chewable tablets  What is this medicine?  MONTELUKAST (mon te LOO kast) is used to prevent and treat the symptoms of asthma. It is also used to treat allergies. Do not use for an acute asthma attack.  This medicine may be used for other purposes; ask your health care provider or pharmacist if you have questions.  COMMON BRAND NAME(S): Singulair  What should I tell my health care provider before I take this medicine?  They need to know if you have any of these conditions:  -liver disease  -phenylketonuria  -an unusual or allergic reaction to montelukast, other medicines, foods, dyes, or preservatives  -pregnant or trying to get pregnant  -breast-feeding  How should I use this medicine?  Take this medicine by mouth with a glass of water. Chew it completely before swallowing. Follow the directions on the prescription label. If you have asthma, take this medicine once a day in the evening. If you have allergies, take this medicine once a day, at about the same time each day. You may take this medicine with or without food. Take your medicine at regular intervals. Do not take it more often than directed. Do not stop taking except on your doctor's advice.  Talk to your pediatrician regarding the use of this medicine in children. While this drug may be prescribed for children as young as 2 years of age, precautions do apply.  Overdosage: If you think you have taken too much of this medicine contact a poison control center or emergency room at once.  NOTE: This medicine is only for you. Do not share this medicine with others.  What if I miss a dose?  If you miss a dose, take it as soon as you can. If it is almost time for your next dose, take only that dose. Do not take double or extra doses.  What may interact with this medicine?  -anti-infectives like rifampin and rifabutin  -medicines for seizures like phenytoin, phenobarbital, and carbamazepine  This list may not describe all possible interactions. Give your  health care provider a list of all the medicines, herbs, non-prescription drugs, or dietary supplements you use. Also tell them if you smoke, drink alcohol, or use illegal drugs. Some items may interact with your medicine.  What should I watch for while using this medicine?  Visit your doctor or health care professional for regular checks on your progress. Tell your doctor or health care professional if your allergy or asthma symptoms do not improve. Take your medicine even when you do not have symptoms. Do not stop taking any of your medicine(s) unless your doctor tells you to.  If you have asthma, talk to your doctor about what to do in an acute asthma attack. Always have your inhaled rescue medicine for asthma attacks with you.  Patients and their families should watch for new or worsening thoughts of suicide or depression. Also watch for sudden changes in feelings such as feeling anxious, agitated, panicky, irritable, hostile, aggressive, impulsive, severely restless, overly excited and hyperactive, or not being able to sleep. Any worsening of mood or thoughts of suicide or dying should be reported to your health care professional right away.  What side effects may I notice from receiving this medicine?  Side effects that you should report to your doctor or health care professional as soon as possible:  -allergic reactions like skin rash or hives, or swelling of the face, lips, or tongue  -breathing problems  -changes in emotions or   moods  -confusion  -depressed mood  -fever or infection  -hallucinations  -joint pain  -painful lumps under the skin  -pain, tingling, numbness in the hands or feet  -redness, blistering, peeling, or loosening of the skin, including inside the mouth  -restlessness  -seizures  -sleep walking  -signs and symptoms of infection like fever; chills; cough; sore throat; flu-like illness  -signs and symptoms of liver injury like dark yellow or brown urine; general ill feeling or flu-like  symptoms; light-colored stools; loss of appetite; nausea; right upper belly pain; unusually weak or tired; yellowing of the eyes or skin  -sinus pain or swelling  -stuttering  -suicidal thoughts or other mood changes  -tremors  -trouble sleeping  -uncontrolled muscle movements  -unusual bleeding or bruising  -vivid or bad dreams  Side effects that usually do not require medical attention (report to your doctor or health care professional if they continue or are bothersome):  -dizziness  -drowsiness  -headache  -runny nose  -stomach upset  -tiredness  This list may not describe all possible side effects. Call your doctor for medical advice about side effects. You may report side effects to FDA at 1-800-FDA-1088.  Where should I keep my medicine?  Keep out of the reach of children.  Store at a room temperature between 15 and 30 degrees C (59 and 86 degrees F). Protect from light and moisture. Keep this medicine in the original bottle. Throw away any unused medicine after the expiration date.  NOTE: This sheet is a summary. It may not cover all possible information. If you have questions about this medicine, talk to your doctor, pharmacist, or health care provider.   2019 Elsevier/Gold Standard (2018-05-02 13:44:59)

## 2018-10-19 NOTE — Assessment & Plan Note (Signed)
Chronic, ongoing.  Continue current medication regimen.  Consider Montelukast in future.

## 2018-10-19 NOTE — Assessment & Plan Note (Signed)
Chronic, ongoing.  Continue current medication regimen.  BP today 134/78, close to goal of <130/90.

## 2018-10-19 NOTE — Progress Notes (Signed)
Subjective:   Charlene Juarez is a 68 y.o. female who presents for Medicare Annual (Subsequent) preventive examination.  Review of Systems:  N/A  Cardiac Risk Factors include: advanced age (>28mn, >>42women);diabetes mellitus;dyslipidemia;hypertension;obesity (BMI >30kg/m2)     Objective:     Vitals: BP 134/78 (BP Location: Left Arm)   Pulse 86   Temp 97.9 F (36.6 C) (Oral)   Ht 5' 2" (1.575 m)   Wt 178 lb (80.7 kg)   LMP 07/13/2000 (Approximate)   BMI 32.56 kg/m   Body mass index is 32.56 kg/m.  Advanced Directives 10/19/2018 10/14/2017 01/13/2017  Does Patient Have a Medical Advance Directive? Yes Yes Yes  [place] Living will Living will  Does patient want to make changes to medical advance directive? - - No - Patient declined  Copy of [place] Chart? No - copy requested - -    Tobacco Social History   Tobacco Use  Smoking Status Never Smoker  Smokeless Tobacco Never Used     Counseling given: Not Answered   Clinical Intake:  Pre-visit preparation completed: Yes  Pain : No/denies pain Pain Score: 0-No pain    Diabetes:  Is the patient diabetic?  Yes type 2 If diabetic, was a CBG obtained today?  No  Did the patient bring in their glucometer from home?  No  How often do you monitor your CBG's? Once to twice daily depending on BS readings.   Financial Strains and Diabetes Management:  Are you having any financial strains with the device, your supplies or your medication? No .  Does the patient want to be seen by Chronic Care Management for management of their diabetes?  No  Would the patient like to be referred to a Nutritionist or for Diabetic Management?  No   Diabetic Exams:  Diabetic Eye Exam: Completed 11/18/16. Overdue for diabetic eye exam. Pt has been advised about the importance in completing this exam. Pt to schedule eye exam this year after insurance  change.  Diabetic Foot Exam: Completed today.    Nutritional Status: BMI > 30  Obese Nutritional Risks: None   How often do you need to have someone help you when you read instructions, pamphlets, or other written materials from your doctor or pharmacy?: 1 - Never  Interpreter Needed?: No  [facility] :: [facility]  Past Medical History:  Diagnosis Date  . Arthritis   . Bursitis   . Cancer [place]    SKIN  . [place]   . GERD (gastroesophageal reflux disease)   . Hyperlipidemia   . Hypertension   . Sleep apnea    Past Surgical History:  Procedure Laterality Date  . cancer removal off nose    . CATARACT EXTRACTION W/PHACO Right 01/13/2017   Procedure: CATARACT EXTRACTION PHACO AND INTRAOCULAR LENS PLACEMENT (IOC);  Surgeon: BEulogio Bear MD;  Location: [facility] ORS;  Service: Ophthalmology;  Laterality: Right;  [place]31.[place] AP% 0.0 CDE 1.78 Fluid Pack lot # [id number]  . CATARACT EXTRACTION W/PHACO Left 02/24/2017   Procedure: CATARACT EXTRACTION PHACO AND INTRAOCULAR LENS PLACEMENT (IOC);  Surgeon: KEulogio Bear MD;  Location: [facility] ORS;  Service: Ophthalmology;  Laterality: Left;  [place] 00:45.5 AP  0.7 CDE   2.76  fluid pack lot # [id number]  exp. 08-19-2018  . DILATION AND CURETTAGE OF UTERUS    . TUBAL LIGATION     Family  History  Problem Relation Age of Onset  . Alcohol abuse Mother   . Heart disease Father   . Alcohol abuse Father   . Diabetes Sister   . Hyperlipidemia Sister   . Diabetes Brother   . Hypertension Brother   . Thyroid disease Daughter   . Diabetes Maternal Grandmother   . Hypertension Sister   . Aneurysm Sister   . Breast cancer Maternal Aunt    Social History   Socioeconomic History  . Marital status: Married    Spouse name: Not on file  . Number of children: 4  . Years of education: Not on file  . Highest education level: Associate degree: occupational, Hotel manager, or vocational program    Occupational History  . Occupation: retired Charity fundraiser  Social Needs  . Financial resource strain: Not hard at all  . Food insecurity:    Worry: Never true    Inability: Never true  . Transportation needs:    Medical: No    Non-medical: No  Tobacco Use  . Smoking status: Never Smoker  . Smokeless tobacco: Never Used  Substance and Sexual Activity  . Alcohol use: No    Alcohol/week: 0.0 standard drinks  . Drug use: No  . Sexual activity: Yes  Lifestyle  . Physical activity:    Days per week: 0 days    Minutes per session: 0 min  . Stress: Only a little  Relationships  . Social connections:    Talks on phone: More than three times a week    Gets together: Three times a week    Attends religious service: 1 to 4 times per year    Active member of club or organization: No    Attends meetings of clubs or organizations: Never    Relationship status: Married  Other Topics Concern  . Not on file  Social History Narrative  . Not on file    Outpatient Encounter Medications as of 10/19/2018  Medication Sig  . Ascorbic Acid (VITAMIN C) 1000 MG tablet Take 1,000 mg by mouth daily.  . benzonatate (TESSALON) 200 MG capsule Take 1 capsule (200 mg total) by mouth 2 (two) times daily as needed for cough.  . Blood Glucose Monitoring Suppl (ONE TOUCH ULTRA SYSTEM KIT) W/DEVICE KIT 1 kit by Does not apply route once. E11.9  . Cholecalciferol (VITAMIN D3) 1000 units CAPS Take 1,000 Units by mouth at bedtime.  . diphenhydrAMINE (BENADRYL) 25 MG tablet Take 50 mg by mouth at bedtime as needed for sleep.  Marland Kitchen gabapentin (NEURONTIN) 100 MG capsule Take 1 capsule (100 mg total) by mouth 3 (three) times daily.  Marland Kitchen glucose blood (BAYER CONTOUR TEST) test strip Contour test strips ( or insurance preferred) used to check blood sugar BID. DX: E11.9  . glucose blood test strip 1 each by Other route as needed for other. Use as instructed  . hydrochlorothiazide (HYDRODIURIL) 25 MG tablet Take 1 tablet (25 mg  total) by mouth daily.  Marland Kitchen JARDIANCE 25 MG TABS tablet TAKE 1 TABLET BY MOUTH EVERY DAY  . losartan (COZAAR) 100 MG tablet Take 1 tablet (100 mg total) by mouth daily.  . Magnesium 500 MG TABS Take 500 mg by mouth at bedtime.  . metFORMIN (GLUCOPHAGE) 1000 MG tablet Take 1 tablet (1,000 mg total) by mouth 2 (two) times daily with a meal.  . Omega-3 Fatty Acids (FISH OIL) 1200 MG CAPS Take 1,200-2,400 mg by mouth 2 (two) times daily. 1 in the am and 2 at  night  . predniSONE (DELTASONE) 10 MG tablet Take 3 tablets (30 mg total) by mouth daily with breakfast for 5 days.  . Semaglutide (OZEMPIC) 1 MG/DOSE SOPN Inject 1 mg into the skin once a week.  . simvastatin (ZOCOR) 20 MG tablet Take 1 tablet (20 mg total) by mouth daily.  . SYMBICORT 160-4.5 MCG/ACT inhaler TAKE 2 PUFFS BY MOUTH TWICE A DAY  . VENTOLIN HFA 108 (90 Base) MCG/ACT inhaler TAKE 2 PUFFS BY MOUTH EVERY 6 HOURS AS NEEDED FOR WHEEZE OR SHORTNESS OF BREATH  . vitamin E 200 UNIT capsule Take 200 Units by mouth daily.   No facility-administered encounter medications on file as of 10/19/2018.     Activities of Daily Living In your present state of health, do you have any difficulty performing the following activities: 10/19/2018 04/18/2018  Hearing? N N  Vision? N N  Comment Wears readers as needed.  -  Difficulty concentrating or making decisions? N N  Walking or climbing stairs? N N  Dressing or bathing? N N  Doing errands, shopping? N N  Preparing Food and eating ? N -  Using the Toilet? N -  In the past six months, have you accidently leaked urine? Y -  Comment Occasionally with pressure.  -  Do you have problems with loss of bowel control? N -  Managing your Medications? N -  Managing your Finances? N -  Housekeeping or managing your Housekeeping? N -  Some recent data might be hidden    Patient Care Team: Venita Lick, NP as PCP - General (Nurse Practitioner) Oneta Rack, MD (Dermatology)    Assessment:    This is a routine wellness examination for Maalle.  Exercise Activities and Dietary recommendations Current Exercise Habits: The patient does not participate in regular exercise at present, Exercise limited by: None identified  Goals    . DIET - INCREASE WATER INTAKE     Recommend drinking at least 6-8 glasses of water a day     . DIET - REDUCE SUGAR INTAKE     Continue to cut out all sweets and desserts in diet to help aid in weight loss.        Fall Risk Fall Risk  10/19/2018 04/18/2018 10/14/2017 04/19/2017  Falls in the past year? 0 No No No   FALL RISK PREVENTION PERTAINING TO THE HOME:  Any stairs in or around the home WITH handrails? No  Home free of loose throw rugs in walkways, pet beds, electrical cords, etc? Yes  Adequate lighting in your home to reduce risk of falls? Yes   ASSISTIVE DEVICES UTILIZED TO PREVENT FALLS:  Life alert? No  Use of a cane, walker or w/c? No  Grab bars in the bathroom? No  Shower chair or bench in shower? No  Elevated toilet seat or a handicapped toilet? No    TIMED UP AND GO:  Was the test performed? No .     Depression Screen PHQ 2/9 Scores 10/19/2018 04/18/2018 10/14/2017 04/19/2017  PHQ - 2 Score 0 0 0 0  PHQ- 9 Score - 0 - 1     Cognitive Function: Declined today.        Immunization History  Administered Date(s) Administered  . Influenza, High Dose Seasonal PF 07/15/2017, 07/27/2018  . Influenza-Unspecified 06/21/2016  . Pneumococcal Conjugate-13 07/15/2017  . Pneumococcal Polysaccharide-23 05/22/2014  . Tdap 03/04/2008  . Zoster 02/26/2012  . Zoster Recombinat (Shingrix) 01/31/2018, 05/02/2018    Qualifies for Shingles  Vaccine? Up to date  Tdap: Although this vaccine is not a covered service during a Wellness Exam, does the patient still wish to receive this vaccine today?  No .  Education has been provided regarding the importance of this vaccine. Advised may receive this vaccine at local pharmacy or Health Dept.  Aware to provide a copy of the vaccination record if obtained from local pharmacy or Health Dept. Verbalized acceptance and understanding.  Flu Vaccine: Up to date  Pneumococcal Vaccine: Up to date   Screening Tests Health Maintenance  Topic Date Due  . DEXA SCAN  10/20/2016  . OPHTHALMOLOGY EXAM  11/18/2017  . TETANUS/TDAP  04/19/2019 (Originally 03/04/2018)  . HEMOGLOBIN A1C  01/18/2019  . PNA vac Low Risk Adult (2 of 2 - PPSV23) 05/23/2019  . FOOT EXAM  10/20/2019  . MAMMOGRAM  07/25/2020  . COLONOSCOPY  09/27/2022  . INFLUENZA VACCINE  Completed  . Hepatitis C Screening  Completed   Cancer Screenings:  Colorectal Screening: Completed 09/27/13. Repeat every 10 years.  Mammogram: Completed 07/25/18.   Bone Density: Ordered today. Pt aware the office will call re: appt.  Lung Cancer Screening: (Low Dose CT Chest recommended if Age 57-80 years, 30 pack-year currently smoking OR have quit w/in 15years.) does not qualify.    Additional Screening:  Hepatitis C Screening: Up to date  Vision Screening: Recommended annual ophthalmology exams for early detection of glaucoma and other disorders of the eye.  Dental Screening: Recommended annual dental exams for proper oral hygiene  Community Resource Referral:  CRR required this visit?  No       Plan:  I have personally reviewed and addressed the Medicare Annual Wellness questionnaire and have noted the following in the patient's chart:  A. Medical and social history B. Use of alcohol, tobacco or illicit drugs  C. Current medications and supplements D. Functional ability and status E.  Nutritional status F.  Physical activity G. Advance directives H. List of other physicians I.  Hospitalizations, surgeries, and ER visits in previous 12 months J.  [place] such as hearing and vision if needed, cognitive and depression L. Referrals and appointments - none  In addition, I have reviewed and discussed with  patient certain preventive protocols, quality metrics, and best practice recommendations. A written personalized care plan for preventive services as well as general preventive health recommendations were provided to patient.  See attached scanned questionnaire for additional information.   Signed,  Fabio Neighbors, LPN Nurse Health Advisor   Nurse Recommendations: Pt declined the tetanus vaccine today. Pt plans to set up a diabetic eye exam this year.

## 2018-10-19 NOTE — Progress Notes (Signed)
BP 134/78 (BP Location: Left Arm, Patient Position: Sitting)   Pulse 86   Temp 97.9 F (36.6 C) (Oral)   Ht 5\' 2"  (1.575 m)   Wt 178 lb (80.7 kg)   LMP 07/13/2000 (Approximate)   SpO2 96%   BMI 32.56 kg/m    Subjective:    Patient ID: Charlene Juarez, female    DOB: 1951-08-17, 68 y.o.   MRN: [id number]  HPI: Charlene Juarez is a 68 y.o. female presents for follow-up  Chief Complaint  Patient presents with  . Diabetes    76m f/u  . Hypertension   DIABETES Last A1C was 8.1% in October.  She is on max doses of Ozempic, Jardiance, and Metformin.  Endorses poor diet choices at home.  In the past she has used Glipizide and reports interest in trying it again if A1C elevated and discontinuing Jardiance. Hypoglycemic episodes:no Polydipsia/polyuria: no Visual disturbance: no Chest pain: no Paresthesias: no Glucose Monitoring: yes  Accucheck frequency: BID  Fasting glucose: 140-160  Post prandial:  Evening: 200  Before meals: Taking Insulin?: no  Long acting insulin:  Short acting insulin: Blood Pressure Monitoring: not checking Retinal Examination: Up to Date Foot Exam: Up to Date Pneumovax: Up to Date Influenza: Up to Date Aspirin: no   HYPERTENSION / HYPERLIPIDEMIA Continues on Losartan & HCTZ and Simvastatin daily without ADR. Satisfied with current treatment? yes Duration of hypertension: chronic BP monitoring frequency: not checking BP range:  BP medication side effects: no Duration of hyperlipidemia: chronic Cholesterol medication side effects: no Cholesterol supplements: none Medication compliance: good compliance Aspirin: no Recent stressors: no Recurrent headaches: no Visual changes: no Palpitations: no Dyspnea: no Chest pain: no Lower extremity edema: no Dizzy/lightheaded: no  ASTHMA Currently on Symbicort and Ventolin. Asthma status: stable Satisfied with current treatment?: yes Albuterol/rescue inhaler frequency:    Dyspnea frequency:  Wheezing frequency: Cough frequency:  Nocturnal symptom frequency:  Limitation of activity: no Current upper respiratory symptoms: recent one, which is improving Triggers:  Home peak flows: Last Spirometry:  Failed/intolerant to following asthma meds:  Asthma meds in past:  Aerochamber/spacer use: no Visits to ER or Urgent Care in past year: no Pneumovax: Up to Date Influenza: Up to Date  Relevant past medical, surgical, family and social history reviewed and updated as indicated. Interim medical history since our last visit reviewed. Allergies and medications reviewed and updated.  Review of Systems  Constitutional: Negative for activity change, appetite change, diaphoresis, fatigue and fever.  Respiratory: Negative for cough, chest tightness and shortness of breath.   Cardiovascular: Negative for chest pain, palpitations and leg swelling.  Gastrointestinal: Negative for abdominal distention, abdominal pain, constipation, diarrhea, nausea and vomiting.  Endocrine: Negative for cold intolerance, heat intolerance, polydipsia, polyphagia and polyuria.  Neurological: Negative for dizziness, syncope, weakness, light-headedness, numbness and headaches.  Psychiatric/Behavioral: Negative.     Per HPI unless specifically indicated above     Objective:    BP 134/78 (BP Location: Left Arm, Patient Position: Sitting)   Pulse 86   Temp 97.9 F (36.6 C) (Oral)   Ht 5\' 2"  (1.575 m)   Wt 178 lb (80.7 kg)   LMP 07/13/2000 (Approximate)   SpO2 96%   BMI 32.56 kg/m   Wt Readings from Last 3 Encounters:  10/19/18 178 lb (80.7 kg)  10/05/18 180 lb 6.4 oz (81.8 kg)  07/19/18 177 lb (80.3 kg)    Physical Exam Vitals signs and nursing note reviewed.  Constitutional:  General: She is awake.     Appearance: She is well-developed.  HENT:     Head: Normocephalic.     Right Ear: Hearing normal.     Left Ear: Hearing normal.     Nose: Nose normal.      Mouth/Throat:     Mouth: Mucous membranes are moist.  Eyes:     General: Lids are normal.        Right eye: No discharge.        Left eye: No discharge.     Conjunctiva/sclera: Conjunctivae normal.     Pupils: Pupils are equal, round, and reactive to light.  Neck:     Musculoskeletal: Normal range of motion and neck supple.     Thyroid: No thyromegaly.     Vascular: No carotid bruit or JVD.  Cardiovascular:     Rate and Rhythm: Normal rate and regular rhythm.     Heart sounds: Normal heart sounds. No murmur. No gallop.   Pulmonary:     Effort: Pulmonary effort is normal.     Breath sounds: Normal breath sounds.  Abdominal:     General: Bowel sounds are normal.     Palpations: Abdomen is soft. There is no hepatomegaly or splenomegaly.  Musculoskeletal:     Right lower leg: No edema.     Left lower leg: No edema.  Lymphadenopathy:     Cervical: No cervical adenopathy.  Skin:    General: Skin is warm and dry.  Neurological:     Mental Status: She is alert and oriented to person, place, and time.  Psychiatric:        Attention and Perception: Attention normal.        Mood and Affect: Mood normal.        Behavior: Behavior normal. Behavior is cooperative.        Thought Content: Thought content normal.        Judgment: Judgment normal.    Diabetic Foot Exam - Simple   Simple Foot Form Visual Inspection No deformities, no ulcerations, no other skin breakdown bilaterally:  Yes Sensation Testing Intact to touch and monofilament testing bilaterally:  Yes Pulse Check Posterior Tibialis and Dorsalis pulse intact bilaterally:  Yes Comments Good sensation bilateral feet, 1+ pulses, intact skin.     Results for orders placed or performed in visit on 07/19/18  HgB A1c  Result Value Ref Range   Hgb A1c MFr Bld 8.1 (H) 4.8 - 5.6 %   Est. average glucose Bld gHb Est-mCnc 186 mg/dL  TSH  Result Value Ref Range   TSH 3.480 0.450 - 4.500 uIU/mL      Assessment & Plan:    Problem List Items Addressed This Visit      Cardiovascular and Mediastinum   Essential hypertension    Chronic, ongoing.  Continue current medication regimen.  BP today 134/78, close to goal of <130/90.      Relevant Orders   Comprehensive metabolic panel     Respiratory   Asthma, intermittent    Chronic, ongoing.  Continue current medication regimen.  Consider Montelukast in future.        Endocrine   Type 2 diabetes mellitus without complications (HCC) - Primary    Chronic, ongoing.  A1C today 7.6%.   Will continue current regimen as she has been focusing heavily on diet changes as of late.  Recheck A1C in 3 months with goal of A1C <7.0.        Relevant Orders  Bayer DCA Hb A1c Waived     Other   Hyperlipidemia    Chronic, ongoing.  Continue current medication regimen.  Lipid panel and CMP today.  Adjust medication as needed.      Relevant Orders   Lipid Panel w/o Chol/HDL Ratio   Vitamin D deficiency    On daily supplement.  Vitamin D level today.      Relevant Orders   VITAMIN D 25 Hydroxy (Vit-D Deficiency, Fractures)    Other Visit Diagnoses    Vitamin B12 deficiency       Reports history of low B12 level in past and would like checked.  On long term Metformin, places at risk for B12 deficiency.   Relevant Orders   B12       Follow up plan: Return in about 3 months (around 01/18/2019) for T2DM, HTN/HLD.

## 2018-10-19 NOTE — Assessment & Plan Note (Signed)
Chronic, ongoing.  Continue current medication regimen.  Lipid panel and CMP today.  Adjust medication as needed.

## 2018-10-19 NOTE — Assessment & Plan Note (Signed)
Chronic, ongoing.  A1C today 7.6%.   Will continue current regimen as she has been focusing heavily on diet changes as of late.  Recheck A1C in 3 months with goal of A1C <7.0.

## 2018-10-19 NOTE — Patient Instructions (Addendum)
[name] , Thank you for taking time to come for your Medicare Wellness Visit. I appreciate your ongoing commitment to your health goals. Please review the following plan we discussed and let me know if I can assist you in the future.   Screening recommendations/referrals: Colonoscopy: Up to date, due 09/2023 Mammogram: Up to date, due 07/2019 Bone Density: Ordered today; pt aware office will contact her to set up appt. Recommended yearly ophthalmology/optometry visit for glaucoma screening and checkup Recommended yearly dental visit for hygiene and checkup  Vaccinations: Influenza vaccine: Up to date Pneumococcal vaccine: Completed series Tdap vaccine: Pt declines today.  Shingles vaccine: Completed series    Advanced directives: Please bring a copy of your POA (Power of Attorney) and/or Living Will to your next appointment.   Conditions/risks identified: Obesity- continue to cut out all sweets and desserts in diet to help aid in weight loss.   Next appointment: Pt to call back and schedule 3 month f/u with PCP. AWV scheduled for 10/25/19.  Preventive Care 41 Years and Older, Female Preventive care refers to lifestyle choices and visits with your health care provider that can promote health and wellness. What does preventive care include?  A yearly physical exam. This is also called an annual well check.  Dental exams once or twice a year.  Routine eye exams. Ask your health care provider how often you should have your eyes checked.  Personal lifestyle choices, including:  Daily care of your teeth and gums.  Regular physical activity.  Eating a healthy diet.  Avoiding tobacco and drug use.  Limiting alcohol use.  Practicing safe sex.  Taking low-dose aspirin every day.  Taking vitamin and mineral supplements as recommended by your health care provider. What happens during an annual well check? The services and screenings done by your health care provider during  your annual well check will depend on your age, overall health, lifestyle risk factors, and family history of disease. Counseling  Your health care provider may ask you questions about your:  Alcohol use.  Tobacco use.  Drug use.  Emotional well-being.  Home and relationship well-being.  Sexual activity.  Eating habits.  History of falls.  Memory and ability to understand (cognition).  Work and work Statistician.  Reproductive health. Screening  You may have the following tests or measurements:  Height, weight, and BMI.  Blood pressure.  Lipid and cholesterol levels. These may be checked every 5 years, or more frequently if you are over 67 years old.  Skin check.  Lung cancer screening. You may have this screening every year starting at age 59 if you have a 30-pack-year history of smoking and currently smoke or have quit within the past 15 years.  Fecal occult blood test (FOBT) of the stool. You may have this test every year starting at age 14.  Flexible sigmoidoscopy or colonoscopy. You may have a sigmoidoscopy every 5 years or a colonoscopy every 10 years starting at age 34.  Hepatitis C blood test.  Hepatitis B blood test.  Sexually transmitted disease (STD) testing.  Diabetes screening. This is done by checking your blood sugar (glucose) after you have not eaten for a while (fasting). You may have this done every 1-3 years.  Bone density scan. This is done to screen for osteoporosis. You may have this done starting at age 58.  Mammogram. This may be done every 1-2 years. Talk to your health care provider about how often you should have regular mammograms. Talk with  your health care provider about your test results, treatment options, and if necessary, the need for more tests. Vaccines  Your health care provider may recommend certain vaccines, such as:  Influenza vaccine. This is recommended every year.  Tetanus, diphtheria, and acellular pertussis (Tdap,  Td) vaccine. You may need a Td booster every 10 years.  Zoster vaccine. You may need this after age 62.  Pneumococcal 13-valent conjugate (PCV13) vaccine. One dose is recommended after age 48.  Pneumococcal polysaccharide (PPSV23) vaccine. One dose is recommended after age 83. Talk to your health care provider about which screenings and vaccines you need and how often you need them. This information is not intended to replace advice given to you by your health care provider. Make sure you discuss any questions you have with your health care provider. Document Released: 10/03/2015 Document Revised: 05/26/2016 Document Reviewed: 07/08/2015 Elsevier Interactive Patient Education  2017 [place] Prevention in the Home Falls can cause injuries. They can happen to people of all ages. There are many things you can do to make your home safe and to help prevent falls. What can I do on the outside of my home?  Regularly fix the edges of walkways and driveways and fix any cracks.  Remove anything that might make you trip as you walk through a door, such as a raised step or threshold.  Trim any bushes or trees on the path to your home.  Use bright outdoor lighting.  Clear any walking paths of anything that might make someone trip, such as rocks or tools.  Regularly check to see if handrails are loose or broken. Make sure that both sides of any steps have handrails.  Any raised decks and porches should have guardrails on the edges.  Have any leaves, snow, or ice cleared regularly.  Use sand or salt on walking paths during winter.  Clean up any spills in your garage right away. This includes oil or grease spills. What can I do in the bathroom?  Use night lights.  Install grab bars by the toilet and in the tub and shower. Do not use towel bars as grab bars.  Use non-skid mats or decals in the tub or shower.  If you need to sit down in the shower, use a plastic, non-slip  stool.  Keep the floor dry. Clean up any water that spills on the floor as soon as it happens.  Remove soap buildup in the tub or shower regularly.  Attach bath mats securely with double-sided non-slip rug tape.  Do not have throw rugs and other things on the floor that can make you trip. What can I do in the bedroom?  Use night lights.  Make sure that you have a light by your bed that is easy to reach.  Do not use any sheets or blankets that are too big for your bed. They should not hang down onto the floor.  Have a firm chair that has side arms. You can use this for support while you get dressed.  Do not have throw rugs and other things on the floor that can make you trip. What can I do in the kitchen?  Clean up any spills right away.  Avoid walking on wet floors.  Keep items that you use a lot in easy-to-reach places.  If you need to reach something above you, use a strong step stool that has a grab bar.  Keep electrical cords out of the way.  Do not  use floor polish or wax that makes floors slippery. If you must use wax, use non-skid floor wax.  Do not have throw rugs and other things on the floor that can make you trip. What can I do with my stairs?  Do not leave any items on the stairs.  Make sure that there are handrails on both sides of the stairs and use them. Fix handrails that are broken or loose. Make sure that handrails are as long as the stairways.  Check any carpeting to make sure that it is firmly attached to the stairs. Fix any carpet that is loose or worn.  Avoid having throw rugs at the top or bottom of the stairs. If you do have throw rugs, attach them to the floor with carpet tape.  Make sure that you have a light switch at the top of the stairs and the bottom of the stairs. If you do not have them, ask someone to add them for you. What else can I do to help prevent falls?  Wear shoes that:  Do not have high heels.  Have rubber bottoms.  Are  comfortable and fit you well.  Are closed at the toe. Do not wear sandals.  If you use a stepladder:  Make sure that it is fully opened. Do not climb a closed stepladder.  Make sure that both sides of the stepladder are locked into place.  Ask someone to hold it for you, if possible.  Clearly mark and make sure that you can see:  Any grab bars or handrails.  First and last steps.  Where the edge of each step is.  Use tools that help you move around (mobility aids) if they are needed. These include:  Canes.  Walkers.  Scooters.  Crutches.  Turn on the lights when you go into a dark area. Replace any light bulbs as soon as they burn out.  Set up your furniture so you have a clear path. Avoid moving your furniture around.  If any of your floors are uneven, fix them.  If there are any pets around you, be aware of where they are.  Review your medicines with your doctor. Some medicines can make you feel dizzy. This can increase your chance of falling. Ask your doctor what other things that you can do to help prevent falls. This information is not intended to replace advice given to you by your health care provider. Make sure you discuss any questions you have with your health care provider. Document Released: 07/03/2009 Document Revised: 02/12/2016 Document Reviewed: 10/11/2014 Elsevier Interactive Patient Education  2017 [employer].

## 2018-10-19 NOTE — Assessment & Plan Note (Signed)
On daily supplement.  Vitamin D level today.

## 2018-10-20 LAB — VITAMIN D 25 HYDROXY (VIT D DEFICIENCY, FRACTURES): VIT D 25 HYDROXY: 34 ng/mL (ref 30.0–100.0)

## 2018-10-20 LAB — COMPREHENSIVE METABOLIC PANEL
ALT: 47 IU/L — AB (ref 0–32)
AST: 26 IU/L (ref 0–40)
Albumin/Globulin Ratio: 1.8 (ref 1.2–2.2)
Albumin: 4.6 g/dL (ref 3.8–4.8)
Alkaline Phosphatase: 64 IU/L (ref 39–117)
BUN/Creatinine Ratio: 25 (ref 12–28)
BUN: 20 mg/dL (ref 8–27)
Bilirubin Total: 0.4 mg/dL (ref 0.0–1.2)
CALCIUM: 10.1 mg/dL (ref 8.7–10.3)
CO2: 20 mmol/L (ref 20–29)
CREATININE: 0.81 mg/dL (ref 0.57–1.00)
Chloride: 96 mmol/L (ref 96–106)
GFR, EST AFRICAN AMERICAN: 88 mL/min/{1.73_m2} (ref >59)
GFR, EST NON AFRICAN AMERICAN: 76 mL/min/{1.73_m2} (ref >59)
Globulin, Total: 2.6 g/dL (ref 1.5–4.5)
Glucose: 259 mg/dL — ABNORMAL HIGH (ref 65–99)
POTASSIUM: 4.1 mmol/L (ref 3.5–5.2)
Sodium: 141 mmol/L (ref 134–144)
TOTAL PROTEIN: 7.2 g/dL (ref 6.0–8.5)

## 2018-10-20 LAB — LIPID PANEL W/O CHOL/HDL RATIO
Cholesterol, Total: 174 mg/dL (ref 100–199)
HDL: 44 mg/dL (ref >39)
LDL CALC: 63 mg/dL (ref 0–99)
TRIGLYCERIDES: 336 mg/dL — AB (ref 0–149)
VLDL CHOLESTEROL CAL: 67 mg/dL — AB (ref 5–40)

## 2018-10-20 LAB — VITAMIN B12: VITAMIN B 12: 263 pg/mL (ref 232–1245)

## 2018-11-14 ENCOUNTER — Other Ambulatory Visit: Payer: Self-pay | Admitting: Nurse Practitioner

## 2018-11-14 NOTE — Telephone Encounter (Signed)
Requested Prescriptions  Pending Prescriptions Disp Refills  . VENTOLIN HFA 108 (90 Base) MCG/ACT inhaler [Pharmacy Med Name: VENTOLIN HFA 90 MCG INHALER] 18 Inhaler 2    Sig: TAKE 2 PUFFS BY MOUTH EVERY 6 HOURS AS NEEDED FOR WHEEZE OR SHORTNESS OF BREATH     Pulmonology:  Beta Agonists Failed - 11/14/2018  1:44 AM      Failed - One inhaler should last at least one month. If the patient is requesting refills earlier, contact the patient to check for uncontrolled symptoms.      Passed - Valid encounter within last 12 months    Recent Outpatient Visits          3 weeks ago Type 2 diabetes mellitus without complication, without long-term current use of insulin ([place])   [place], [place] T, NP   1 month ago Acute non-recurrent frontal sinusitis   [place], [place] T, NP   3 months ago Type 2 diabetes mellitus without complication, without long-term current use of insulin ([place])   [place], [place] T, NP   7 months ago Type 2 diabetes mellitus without complication, without long-term current use of insulin ([place])   [place] Kathrine Haddock, NP   9 months ago Sore throat   [place] Kathrine Haddock, NP      Future Appointments            In 2 months [place], Barbaraann Faster, NP [facility]   In 10 months  [facility]

## 2018-12-04 ENCOUNTER — Other Ambulatory Visit: Payer: Self-pay | Admitting: Unknown Physician Specialty

## 2018-12-27 ENCOUNTER — Other Ambulatory Visit: Payer: Self-pay | Admitting: Nurse Practitioner

## 2019-01-18 ENCOUNTER — Other Ambulatory Visit: Payer: Self-pay

## 2019-01-18 ENCOUNTER — Encounter: Payer: Self-pay | Admitting: Family Medicine

## 2019-01-18 ENCOUNTER — Ambulatory Visit: Payer: Medicare Other | Admitting: Nurse Practitioner

## 2019-01-18 ENCOUNTER — Ambulatory Visit (INDEPENDENT_AMBULATORY_CARE_PROVIDER_SITE_OTHER): Payer: Medicare Other | Admitting: Family Medicine

## 2019-01-18 VITALS — BP 140/78 | Ht 62.5 in | Wt 165.0 lb

## 2019-01-18 DIAGNOSIS — Z6832 Body mass index (BMI) 32.0-32.9, adult: Secondary | ICD-10-CM

## 2019-01-18 DIAGNOSIS — E119 Type 2 diabetes mellitus without complications: Secondary | ICD-10-CM

## 2019-01-18 DIAGNOSIS — E538 Deficiency of other specified B group vitamins: Secondary | ICD-10-CM | POA: Diagnosis not present

## 2019-01-18 DIAGNOSIS — E6609 Other obesity due to excess calories: Secondary | ICD-10-CM

## 2019-01-18 DIAGNOSIS — I10 Essential (primary) hypertension: Secondary | ICD-10-CM

## 2019-01-18 DIAGNOSIS — E7800 Pure hypercholesterolemia, unspecified: Secondary | ICD-10-CM

## 2019-01-18 MED ORDER — SIMVASTATIN 20 MG PO TABS: 20.0000 mg | ORAL_TABLET | Freq: Every day | ORAL | 1 refills | Status: DC

## 2019-01-18 MED ORDER — SEMAGLUTIDE (1 MG/DOSE) 2 MG/1.5ML ~~LOC~~ SOPN: 1.0000 mg | PEN_INJECTOR | SUBCUTANEOUS | 1 refills | Status: DC

## 2019-01-18 MED ORDER — EMPAGLIFLOZIN 25 MG PO TABS: 25.0000 mg | ORAL_TABLET | Freq: Every day | ORAL | 1 refills | Status: DC

## 2019-01-18 MED ORDER — METFORMIN HCL 1000 MG PO TABS: 1000.0000 mg | ORAL_TABLET | Freq: Two times a day (BID) | ORAL | 1 refills | Status: DC

## 2019-01-18 NOTE — Progress Notes (Signed)
BP 140/78 (BP Location: Right Arm, Patient Position: Sitting, Cuff Size: Normal)   Ht 5' 2.5" (1.588 m)   Wt 165 lb (74.8 kg)   LMP 07/13/2000 (Approximate)   BMI 29.70 kg/m    Subjective:    Patient ID: Charlene Juarez, female    DOB: 11-May-1951, 68 y.o.   MRN: [id number]  HPI: Charlene Juarez is a 68 y.o. female  Chief Complaint  Patient presents with  . Diabetes    3 month F/U. No complaints. Patient states sugar was 98 this AM and 117 last night.   . Hyperlipidemia  . Hypertension   Had not been taking her B12 regularly. Had been taking it, but then stopped it. She notes that she started the keto diet and has lost 13lbs! Feeling great!  HYPERTENSION / HYPERLIPIDEMIA Satisfied with current treatment? yes Duration of hypertension: chronic BP monitoring frequency: not checking BP medication side effects: no Past BP meds: HCTZ, losartan Duration of hyperlipidemia: chronic Cholesterol medication side effects: no Cholesterol supplements: fish oil Past cholesterol medications: simvastatin Medication compliance: excellent compliance Aspirin: no Recent stressors: no Recurrent headaches: no Visual changes: no Palpitations: no Dyspnea: no Chest pain: no Lower extremity edema: no Dizzy/lightheaded: no  DIABETES Hypoglycemic episodes:yes Polydipsia/polyuria: no Visual disturbance: no Chest pain: no Paresthesias: no Glucose Monitoring: yes  Accucheck frequency: Daily Taking Insulin?: no Blood Pressure Monitoring: not checking Retinal Examination: Not up to Date Foot Exam: Up to Date Diabetic Education: Completed Pneumovax: Up to Date Influenza: Up to Date Aspirin: no   Relevant past medical, surgical, family and social history reviewed and updated as indicated. Interim medical history since our last visit reviewed. Allergies and medications reviewed and updated.  Review of Systems  Constitutional: Negative.   Respiratory: Negative.    Cardiovascular: Negative.   Neurological: Negative.   Psychiatric/Behavioral: Negative.     Per HPI unless specifically indicated above     Objective:    BP 140/78 (BP Location: Right Arm, Patient Position: Sitting, Cuff Size: Normal)   Ht 5' 2.5" (1.588 m)   Wt 165 lb (74.8 kg)   LMP 07/13/2000 (Approximate)   BMI 29.70 kg/m   Wt Readings from Last 3 Encounters:  01/18/19 165 lb (74.8 kg)  10/19/18 178 lb (80.7 kg)  10/19/18 178 lb (80.7 kg)    Physical Exam Vitals signs and nursing note reviewed.  Constitutional:      General: She is not in acute distress.    Appearance: Normal appearance. She is not ill-appearing, toxic-appearing or diaphoretic.  HENT:     Head: Normocephalic and atraumatic.     Right Ear: External ear normal.     Left Ear: External ear normal.     Nose: Nose normal.     Mouth/Throat:     Mouth: Mucous membranes are moist.     Pharynx: Oropharynx is clear.  Eyes:     General: No scleral icterus.       Right eye: No discharge.        Left eye: No discharge.     Conjunctiva/sclera: Conjunctivae normal.     Pupils: Pupils are equal, round, and reactive to light.  Neck:     Musculoskeletal: Normal range of motion.  Pulmonary:     Effort: Pulmonary effort is normal. No respiratory distress.     Comments: Speaking in full sentences Musculoskeletal: Normal range of motion.  Skin:    Coloration: Skin is not jaundiced or pale.     Findings: No  bruising, erythema, lesion or rash.  Neurological:     Mental Status: She is alert and oriented to person, place, and time. Mental status is at baseline.  Psychiatric:        Mood and Affect: Mood normal.        Behavior: Behavior normal.        Thought Content: Thought content normal.        Judgment: Judgment normal.     Results for orders placed or performed in visit on 10/19/18  Comprehensive metabolic panel  Result Value Ref Range   Glucose 259 (H) 65 - 99 mg/dL   BUN 20 8 - 27 mg/dL   Creatinine,  Ser 0.81 0.57 - 1.00 mg/dL   GFR calc non Af Amer 76 >59 mL/min/1.73   GFR calc Af Amer 88 >59 mL/min/1.73   BUN/Creatinine Ratio 25 12 - 28   Sodium 141 134 - 144 mmol/L   Potassium 4.1 3.5 - 5.2 mmol/L   Chloride 96 96 - 106 mmol/L   CO2 20 20 - 29 mmol/L   Calcium 10.1 8.7 - 10.3 mg/dL   Total Protein 7.2 6.0 - 8.5 g/dL   Albumin 4.6 3.8 - 4.8 g/dL   Globulin, Total 2.6 1.5 - 4.5 g/dL   Albumin/Globulin Ratio 1.8 1.2 - 2.2   Bilirubin Total 0.4 0.0 - 1.2 mg/dL   Alkaline Phosphatase 64 39 - 117 IU/L   AST 26 0 - 40 IU/L   ALT 47 (H) 0 - 32 IU/L  Lipid Panel w/o Chol/HDL Ratio  Result Value Ref Range   Cholesterol, Total 174 100 - 199 mg/dL   Triglycerides 336 (H) 0 - 149 mg/dL   HDL 44 >39 mg/dL   VLDL Cholesterol Cal 67 (H) 5 - 40 mg/dL   LDL Calculated 63 0 - 99 mg/dL  VITAMIN D 25 Hydroxy (Vit-D Deficiency, Fractures)  Result Value Ref Range   Vit D, 25-Hydroxy 34.0 30.0 - 100.0 ng/mL  B12  Result Value Ref Range   Vitamin B-12 263 232 - 1,245 pg/mL  Bayer DCA Hb A1c Waived  Result Value Ref Range   HB A1C (BAYER DCA - WAIVED) 7.6 (H) <7.0 %      Assessment & Plan:   Problem List Items Addressed This Visit      Cardiovascular and Mediastinum   Essential hypertension - Primary    Under good control on current regimen. Continue current regimen. Continue to monitor. Call with any concerns. Refills given. Labs to be drawn.        Relevant Medications   simvastatin (ZOCOR) 20 MG tablet   Other Relevant Orders   Comprehensive metabolic panel     Endocrine   Type 2 diabetes mellitus without complications (HCC)    Feeling great! Notes that her sugars are doing much better. Will check her A1c and adjust medicine as needed. Call with any concerns. Continue to monitor.       Relevant Medications   simvastatin (ZOCOR) 20 MG tablet   Semaglutide, 1 MG/DOSE, (OZEMPIC, 1 MG/DOSE,) 2 MG/1.5ML SOPN   metFORMIN (GLUCOPHAGE) 1000 MG tablet   empagliflozin (JARDIANCE) 25  MG TABS tablet   Other Relevant Orders   Bayer DCA Hb A1c Waived     Other   Hyperlipidemia    Under good control on current regimen. Continue current regimen. Continue to monitor. Call with any concerns. Refills given. Labs to be drawn.       Relevant Medications   simvastatin (ZOCOR)  20 MG tablet   Other Relevant Orders   Comprehensive metabolic panel   Lipid Panel w/o Chol/HDL Ratio   Obesity    Congratulated patient on weight loss of 13lbs! Continue diet and exercise. Call with any concerns. Continue to monitor.       Relevant Medications   Semaglutide, 1 MG/DOSE, (OZEMPIC, 1 MG/DOSE,) 2 MG/1.5ML SOPN   metFORMIN (GLUCOPHAGE) 1000 MG tablet   empagliflozin (JARDIANCE) 25 MG TABS tablet   B12 deficiency    Will recheck levels. Start taking medication. Call with any concerns. Continue to monitor.       Relevant Orders   B12       Follow up plan: Return in about 3 months (around 04/19/2019) for follow up DM.    Marland Kitchen This visit was completed via FaceTime due to the restrictions of the COVID-19 pandemic. All issues as above were discussed and addressed. Physical exam was done as above through visual confirmation on FaceTime. If it was felt that the patient should be evaluated in the office, they were directed there. The patient verbally consented to this visit. . Location of the patient: home . Location of the provider: home . Those involved with this call:  . Provider: Park Liter, DO . CMA: Merilyn Baba, CMA . Front Desk/Registration: Don Perking  . Time spent on call: 25 minutes with patient face to face via video conference. More than 50% of this time was spent in counseling and coordination of care. 40 minutes total spent in review of patient's record and preparation of their chart.

## 2019-01-18 NOTE — Assessment & Plan Note (Signed)
Under good control on current regimen. Continue current regimen. Continue to monitor. Call with any concerns. Refills given. Labs to be drawn.  

## 2019-01-18 NOTE — Assessment & Plan Note (Signed)
Feeling great! Notes that her sugars are doing much better. Will check her A1c and adjust medicine as needed. Call with any concerns. Continue to monitor.

## 2019-01-18 NOTE — Assessment & Plan Note (Signed)
Congratulated patient on weight loss of 13lbs! Continue diet and exercise. Call with any concerns. Continue to monitor.

## 2019-01-18 NOTE — Assessment & Plan Note (Signed)
Will recheck levels. Start taking medication. Call with any concerns. Continue to monitor.

## 2019-01-19 ENCOUNTER — Other Ambulatory Visit: Payer: Medicare Other

## 2019-01-19 ENCOUNTER — Other Ambulatory Visit: Payer: Self-pay

## 2019-01-19 DIAGNOSIS — I10 Essential (primary) hypertension: Secondary | ICD-10-CM | POA: Diagnosis not present

## 2019-01-19 DIAGNOSIS — E538 Deficiency of other specified B group vitamins: Secondary | ICD-10-CM | POA: Diagnosis not present

## 2019-01-19 DIAGNOSIS — E119 Type 2 diabetes mellitus without complications: Secondary | ICD-10-CM

## 2019-01-19 DIAGNOSIS — E7800 Pure hypercholesterolemia, unspecified: Secondary | ICD-10-CM | POA: Diagnosis not present

## 2019-01-19 LAB — BAYER DCA HB A1C WAIVED: HB A1C (BAYER DCA - WAIVED): 6.4 % (ref <7.0)

## 2019-01-20 LAB — LIPID PANEL W/O CHOL/HDL RATIO
Cholesterol, Total: 115 mg/dL (ref 100–199)
HDL: 58 mg/dL (ref >39)
LDL Calculated: 39 mg/dL (ref 0–99)
Triglycerides: 88 mg/dL (ref 0–149)
VLDL Cholesterol Cal: 18 mg/dL (ref 5–40)

## 2019-01-20 LAB — COMPREHENSIVE METABOLIC PANEL
ALT: 42 IU/L — ABNORMAL HIGH (ref 0–32)
AST: 33 IU/L (ref 0–40)
Albumin/Globulin Ratio: 2 (ref 1.2–2.2)
Albumin: 4.5 g/dL (ref 3.8–4.8)
Alkaline Phosphatase: 60 IU/L (ref 39–117)
BUN/Creatinine Ratio: 29 — ABNORMAL HIGH (ref 12–28)
BUN: 20 mg/dL (ref 8–27)
Bilirubin Total: 0.4 mg/dL (ref 0.0–1.2)
CO2: 21 mmol/L (ref 20–29)
Calcium: 9.4 mg/dL (ref 8.7–10.3)
Chloride: 100 mmol/L (ref 96–106)
Creatinine, Ser: 0.69 mg/dL (ref 0.57–1.00)
GFR calc Af Amer: 104 mL/min/{1.73_m2} (ref >59)
GFR calc non Af Amer: 90 mL/min/{1.73_m2} (ref >59)
Globulin, Total: 2.3 g/dL (ref 1.5–4.5)
Glucose: 92 mg/dL (ref 65–99)
Potassium: 4.2 mmol/L (ref 3.5–5.2)
Sodium: 138 mmol/L (ref 134–144)
Total Protein: 6.8 g/dL (ref 6.0–8.5)

## 2019-01-20 LAB — VITAMIN B12: Vitamin B-12: 368 pg/mL (ref 232–1245)

## 2019-01-27 ENCOUNTER — Other Ambulatory Visit: Payer: Self-pay | Admitting: Nurse Practitioner

## 2019-02-23 ENCOUNTER — Telehealth: Payer: Self-pay | Admitting: Nurse Practitioner

## 2019-02-23 MED ORDER — ONDANSETRON HCL 4 MG PO TABS: 4.0000 mg | ORAL_TABLET | Freq: Three times a day (TID) | ORAL | 0 refills | Status: DC | PRN

## 2019-02-23 NOTE — Telephone Encounter (Signed)
Will send in Zofran for nausea with medications to use PRN

## 2019-02-23 NOTE — Telephone Encounter (Signed)
Pt aware Rx called in and to only use as needed.  Pt verbalized understanding.

## 2019-02-23 NOTE — Telephone Encounter (Signed)
Called patient. No answer. Left VM for patient to return call to the office.

## 2019-02-23 NOTE — Telephone Encounter (Signed)
Copied from [place] [id number]. Topic: General - Other >> Feb 23, 2019 12:46 PM Keene Breath wrote: Reason for CRM: Patient called to request that the doctor prescribe some medication for her nausea which she has when she takes some of her other medications.  Please advise and call patient back at [phone].

## 2019-03-21 ENCOUNTER — Other Ambulatory Visit: Payer: Self-pay | Admitting: Nurse Practitioner

## 2019-03-21 ENCOUNTER — Other Ambulatory Visit: Payer: Self-pay | Admitting: Unknown Physician Specialty

## 2019-03-21 NOTE — Telephone Encounter (Signed)
Does Charlene Juarez want to continue this rx

## 2019-03-23 ENCOUNTER — Other Ambulatory Visit: Payer: Self-pay | Admitting: Nurse Practitioner

## 2019-03-23 NOTE — Telephone Encounter (Signed)
Requested medication (s) are due for refill today:yes  Requested medication (s) are on the active medication list: yes  Last refill:   Future visit scheduled: yes  Notes to clinic:  See pharmacy note dated 03/23/2019    Requested Prescriptions  Pending Prescriptions Disp Refills   telmisartan (MICARDIS) 80 MG tablet [Pharmacy Med Name: TELMISARTAN 80 MG TABLET]  0    Sig: Please specify directions, refills and quantity     Cardiovascular:  Angiotensin Receptor Blockers Failed - 03/23/2019  7:58 AM      Failed - Last BP in normal range    BP Readings from Last 1 Encounters:  01/18/19 140/78         Passed - Cr in normal range and within 180 days    Creatinine, Ser  Date Value Ref Range Status  01/19/2019 0.69 0.57 - 1.00 mg/dL Final         Passed - K in normal range and within 180 days    Potassium  Date Value Ref Range Status  01/19/2019 4.2 3.5 - 5.2 mmol/L Final         Passed - Patient is not pregnant      Passed - Valid encounter within last 6 months    Recent Outpatient Visits          2 months ago Essential hypertension   [place], Megan P, DO   5 months ago Type 2 diabetes mellitus without complication, without long-term current use of insulin ([place])   [place], [place] T, NP   5 months ago Acute non-recurrent frontal sinusitis   [place], Jolene T, NP   8 months ago Type 2 diabetes mellitus without complication, without long-term current use of insulin ([place])   [place], Jolene T, NP   11 months ago Type 2 diabetes mellitus without complication, without long-term current use of insulin ([place])   [place] Kathrine Haddock, NP      Future Appointments            In 1 month [place], Barbaraann Faster, NP [facility]   In 7 months  [facility]

## 2019-04-24 ENCOUNTER — Encounter: Payer: Self-pay | Admitting: Nurse Practitioner

## 2019-04-24 ENCOUNTER — Other Ambulatory Visit: Payer: Self-pay

## 2019-04-24 ENCOUNTER — Ambulatory Visit (INDEPENDENT_AMBULATORY_CARE_PROVIDER_SITE_OTHER): Payer: Medicare Other | Admitting: Nurse Practitioner

## 2019-04-24 VITALS — BP 130/82 | HR 66 | Temp 98.1°F | Wt 160.4 lb

## 2019-04-24 DIAGNOSIS — I10 Essential (primary) hypertension: Secondary | ICD-10-CM | POA: Diagnosis not present

## 2019-04-24 DIAGNOSIS — E119 Type 2 diabetes mellitus without complications: Secondary | ICD-10-CM

## 2019-04-24 DIAGNOSIS — E785 Hyperlipidemia, unspecified: Secondary | ICD-10-CM | POA: Diagnosis not present

## 2019-04-24 DIAGNOSIS — Z6832 Body mass index (BMI) 32.0-32.9, adult: Secondary | ICD-10-CM

## 2019-04-24 DIAGNOSIS — E538 Deficiency of other specified B group vitamins: Secondary | ICD-10-CM

## 2019-04-24 DIAGNOSIS — E1169 Type 2 diabetes mellitus with other specified complication: Secondary | ICD-10-CM

## 2019-04-24 DIAGNOSIS — E6609 Other obesity due to excess calories: Secondary | ICD-10-CM | POA: Diagnosis not present

## 2019-04-24 LAB — BAYER DCA HB A1C WAIVED: HB A1C (BAYER DCA - WAIVED): 5.6 % (ref <7.0)

## 2019-04-24 NOTE — Assessment & Plan Note (Signed)
Chronic, stable with A1C contiinuing downward trend at 5.6% today.  Will trial off Jardiance per patient request and continue Metformin and Ozempic.  To return in 4 weeks with blood sugar chart and notify provider if elevations in morning >130.  Will consider Jardiance 10 MG if elevations noted.

## 2019-04-24 NOTE — Assessment & Plan Note (Signed)
Recheck B12 level today

## 2019-04-24 NOTE — Assessment & Plan Note (Signed)
Chronic, ongoing.  Continue current medication regimen and adjust as needed.  Lipid panel next visit.    

## 2019-04-24 NOTE — Progress Notes (Signed)
BP 130/82   Pulse 66   Temp 98.1 F (36.7 C) (Oral)   Wt 160 lb 6.4 oz (72.8 kg)   LMP 07/13/2000 (Approximate)   SpO2 97%   BMI 28.87 kg/m    Subjective:    Patient ID: Charlene Juarez, female    DOB: 08-31-1951, 68 y.o.   MRN: [id number]  HPI: Charlene Juarez is a 68 y.o. female  Chief Complaint  Patient presents with  . Diabetes    pt states she has eye exam soon  . Hyperlipidemia  . Hypertension   DIABETES Continues on Metformin, Ozempic, and Jardiance..  Last A1C 6.4% in May.  Continues to focus on diet changes with reduction in carbs,  History of low B12 level with Metformin long term use, is taking daily supplement. Hypoglycemic episodes:no Polydipsia/polyuria: no Visual disturbance: no Chest pain: no Paresthesias: no Glucose Monitoring: yes  Accucheck frequency: Daily  Fasting glucose: 114 yesterday morning, 89-100's average (one 170 due to birthday event)  Post prandial:  Evening:  Before meals: Taking Insulin?: no  Long acting insulin:  Short acting insulin: Blood Pressure Monitoring: not checking Retinal Examination: Not up to Date Foot Exam: Up to Date Pneumovax: Up to Date Influenza: Up to Date Aspirin: no   HYPERTENSION / HYPERLIPIDEMIA Continues on Micardis, HCTZ, and Simvastatin. Satisfied with current treatment? yes Duration of hypertension: chronic BP monitoring frequency: not checking BP range:  BP medication side effects: no Duration of hyperlipidemia: chronic Cholesterol medication side effects: no Cholesterol supplements: none Medication compliance: good compliance Aspirin: no Recent stressors: no Recurrent headaches: no Visual changes: no Palpitations: no Dyspnea: no Chest pain: no Lower extremity edema: no Dizzy/lightheaded: no  Relevant past medical, surgical, family and social history reviewed and updated as indicated. Interim medical history since our last visit reviewed. Allergies and medications  reviewed and updated.  Review of Systems  Constitutional: Negative for activity change, appetite change, diaphoresis, fatigue and fever.  Respiratory: Negative for cough, chest tightness and shortness of breath.   Cardiovascular: Negative for chest pain, palpitations and leg swelling.  Gastrointestinal: Negative for abdominal distention, abdominal pain, constipation, diarrhea, nausea and vomiting.  Endocrine: Negative for cold intolerance, heat intolerance, polydipsia, polyphagia and polyuria.  Neurological: Negative for dizziness, syncope, weakness, light-headedness, numbness and headaches.  Psychiatric/Behavioral: Negative.     Per HPI unless specifically indicated above     Objective:    BP 130/82   Pulse 66   Temp 98.1 F (36.7 C) (Oral)   Wt 160 lb 6.4 oz (72.8 kg)   LMP 07/13/2000 (Approximate)   SpO2 97%   BMI 28.87 kg/m   Wt Readings from Last 3 Encounters:  04/24/19 160 lb 6.4 oz (72.8 kg)  01/18/19 165 lb (74.8 kg)  10/19/18 178 lb (80.7 kg)    Physical Exam Vitals signs and nursing note reviewed.  Constitutional:      General: She is awake. She is not in acute distress.    Appearance: She is well-developed. She is not ill-appearing.  HENT:     Head: Normocephalic.     Right Ear: Hearing normal.     Left Ear: Hearing normal.     Nose: Nose normal.     Mouth/Throat:     Mouth: Mucous membranes are moist.  Eyes:     General: Lids are normal.        Right eye: No discharge.        Left eye: No discharge.     Conjunctiva/sclera:  Conjunctivae normal.     Pupils: Pupils are equal, round, and reactive to light.  Neck:     Musculoskeletal: Normal range of motion and neck supple.     Thyroid: No thyromegaly.     Vascular: No carotid bruit.  Cardiovascular:     Rate and Rhythm: Normal rate and regular rhythm.     Heart sounds: Normal heart sounds. No murmur. No gallop.   Pulmonary:     Effort: Pulmonary effort is normal. No accessory muscle usage or  respiratory distress.     Breath sounds: Normal breath sounds.  Abdominal:     General: Bowel sounds are normal.     Palpations: Abdomen is soft.     Tenderness: There is no abdominal tenderness.  Musculoskeletal:     Right lower leg: No edema.     Left lower leg: No edema.  Lymphadenopathy:     Cervical: No cervical adenopathy.  Skin:    General: Skin is warm and dry.  Neurological:     Mental Status: She is alert and oriented to person, place, and time.  Psychiatric:        Attention and Perception: Attention normal.        Mood and Affect: Mood normal.        Speech: Speech normal.        Behavior: Behavior normal. Behavior is cooperative.        Thought Content: Thought content normal.        Judgment: Judgment normal.     Results for orders placed or performed in visit on 01/19/19  Bayer DCA Hb A1c Waived  Result Value Ref Range   HB A1C (BAYER DCA - WAIVED) 6.4 <7.0 %  Lipid Panel w/o Chol/HDL Ratio  Result Value Ref Range   Cholesterol, Total 115 100 - 199 mg/dL   Triglycerides 88 0 - 149 mg/dL   HDL 58 >39 mg/dL   VLDL Cholesterol Cal 18 5 - 40 mg/dL   LDL Calculated 39 0 - 99 mg/dL  Comprehensive metabolic panel  Result Value Ref Range   Glucose 92 65 - 99 mg/dL   BUN 20 8 - 27 mg/dL   Creatinine, Ser 0.69 0.57 - 1.00 mg/dL   GFR calc non Af Amer 90 >59 mL/min/1.73   GFR calc Af Amer 104 >59 mL/min/1.73   BUN/Creatinine Ratio 29 (H) 12 - 28   Sodium 138 134 - 144 mmol/L   Potassium 4.2 3.5 - 5.2 mmol/L   Chloride 100 96 - 106 mmol/L   CO2 21 20 - 29 mmol/L   Calcium 9.4 8.7 - 10.3 mg/dL   Total Protein 6.8 6.0 - 8.5 g/dL   Albumin 4.5 3.8 - 4.8 g/dL   Globulin, Total 2.3 1.5 - 4.5 g/dL   Albumin/Globulin Ratio 2.0 1.2 - 2.2   Bilirubin Total 0.4 0.0 - 1.2 mg/dL   Alkaline Phosphatase 60 39 - 117 IU/L   AST 33 0 - 40 IU/L   ALT 42 (H) 0 - 32 IU/L  B12  Result Value Ref Range   Vitamin B-12 368 232 - 1,245 pg/mL      Assessment & Plan:   Problem  List Items Addressed This Visit      Cardiovascular and Mediastinum   Essential hypertension    Chronic, stable with BP at goal.  Continue current medication regimen, Micardis for kidney protection, and adjust as needed.  CMP today.          Endocrine  Type 2 diabetes mellitus without complications (HCC) - Primary    Chronic, stable with A1C contiinuing downward trend at 5.6% today.  Will trial off Jardiance per patient request and continue Metformin and Ozempic.  To return in 4 weeks with blood sugar chart and notify provider if elevations in morning >130.  Will consider Jardiance 10 MG if elevations noted.        Relevant Orders   Bayer DCA Hb A1c Waived   Comprehensive metabolic panel   Hyperlipidemia associated with type 2 diabetes mellitus (HCC)    Chronic, ongoing.  Continue current medication regimen and adjust as needed.  Lipid panel next visit.          Other   Obesity    Praised for weight loss and diet changes.  Recommend continued focus on health diet choices and regular physical activity (30 minutes 5 days a week).      B12 deficiency    Recheck B12 level today.      Relevant Orders   Vitamin B12       Follow up plan: Return in about 4 weeks (around 05/22/2019) for T2DM and then 3 months for T2DM, HTN/HLD.

## 2019-04-24 NOTE — Patient Instructions (Signed)
Carbohydrate Counting for Diabetes Mellitus, Adult  Carbohydrate counting is a method of keeping track of how many carbohydrates you eat. Eating carbohydrates naturally increases the amount of sugar (glucose) in the blood. Counting how many carbohydrates you eat helps keep your blood glucose within normal limits, which helps you manage your diabetes (diabetes mellitus). It is important to know how many carbohydrates you can safely have in each meal. This is different for every person. A diet and nutrition specialist (registered dietitian) can help you make a meal plan and calculate how many carbohydrates you should have at each meal and snack. Carbohydrates are found in the following foods:  Grains, such as breads and cereals.  Dried beans and soy products.  Starchy vegetables, such as potatoes, peas, and corn.  Fruit and fruit juices.  Milk and yogurt.  Sweets and snack foods, such as cake, cookies, candy, chips, and soft drinks. How do I count carbohydrates? There are two ways to count carbohydrates in food. You can use either of the methods or a combination of both. Reading "Nutrition Facts" on packaged food The "Nutrition Facts" list is included on the labels of almost all packaged foods and beverages in the [place] It includes:  The serving size.  Information about nutrients in each serving, including the grams (g) of carbohydrate per serving. To use the "Nutrition Facts":  Decide how many servings you will have.  Multiply the number of servings by the number of carbohydrates per serving.  The resulting number is the total amount of carbohydrates that you will be having. Learning standard serving sizes of other foods When you eat carbohydrate foods that are not packaged or do not include "Nutrition Facts" on the label, you need to measure the servings in order to count the amount of carbohydrates:  Measure the foods that you will eat with a food scale or measuring cup, if needed.   Decide how many standard-size servings you will eat.  Multiply the number of servings by 15. Most carbohydrate-rich foods have about 15 g of carbohydrates per serving. ? For example, if you eat 8 oz (170 g) of strawberries, you will have eaten 2 servings and 30 g of carbohydrates (2 servings x 15 g = 30 g).  For foods that have more than one food mixed, such as soups and casseroles, you must count the carbohydrates in each food that is included. The following list contains standard serving sizes of common carbohydrate-rich foods. Each of these servings has about 15 g of carbohydrates:   hamburger bun or  English muffin.   oz (15 mL) syrup.   oz (14 g) jelly.  1 slice of bread.  1 six-inch tortilla.  3 oz (85 g) cooked rice or pasta.  4 oz (113 g) cooked dried beans.  4 oz (113 g) starchy vegetable, such as peas, corn, or potatoes.  4 oz (113 g) hot cereal.  4 oz (113 g) mashed potatoes or  of a large baked potato.  4 oz (113 g) canned or frozen fruit.  4 oz (120 mL) fruit juice.  4-6 crackers.  6 chicken nuggets.  6 oz (170 g) unsweetened dry cereal.  6 oz (170 g) plain fat-free yogurt or yogurt sweetened with artificial sweeteners.  8 oz (240 mL) milk.  8 oz (170 g) fresh fruit or one small piece of fruit.  24 oz (680 g) popped popcorn. Example of carbohydrate counting Sample meal  3 oz (85 g) chicken breast.  6 oz (170 g)   brown rice.  4 oz (113 g) corn.  8 oz (240 mL) milk.  8 oz (170 g) strawberries with sugar-free whipped topping. Carbohydrate calculation 1. Identify the foods that contain carbohydrates: ? Rice. ? Corn. ? Milk. ? Strawberries. 2. Calculate how many servings you have of each food: ? 2 servings rice. ? 1 serving corn. ? 1 serving milk. ? 1 serving strawberries. 3. Multiply each number of servings by 15 g: ? 2 servings rice x 15 g = 30 g. ? 1 serving corn x 15 g = 15 g. ? 1 serving milk x 15 g = 15 g. ? 1 serving  strawberries x 15 g = 15 g. 4. Add together all of the amounts to find the total grams of carbohydrates eaten: ? 30 g + 15 g + 15 g + 15 g = 75 g of carbohydrates total. Summary  Carbohydrate counting is a method of keeping track of how many carbohydrates you eat.  Eating carbohydrates naturally increases the amount of sugar (glucose) in the blood.  Counting how many carbohydrates you eat helps keep your blood glucose within normal limits, which helps you manage your diabetes.  A diet and nutrition specialist (registered dietitian) can help you make a meal plan and calculate how many carbohydrates you should have at each meal and snack. This information is not intended to replace advice given to you by your health care provider. Make sure you discuss any questions you have with your health care provider. Document Released: 09/06/2005 Document Revised: 03/31/2017 Document Reviewed: 02/18/2016 Elsevier Patient Education  2020 [employer]  

## 2019-04-24 NOTE — Assessment & Plan Note (Signed)
Praised for weight loss and diet changes.  Recommend continued focus on health diet choices and regular physical activity (30 minutes 5 days a week).

## 2019-04-24 NOTE — Assessment & Plan Note (Signed)
Chronic, stable with BP at goal.  Continue current medication regimen, Micardis for kidney protection, and adjust as needed.  CMP today.

## 2019-04-25 LAB — COMPREHENSIVE METABOLIC PANEL
ALT: 65 IU/L — ABNORMAL HIGH (ref 0–32)
AST: 36 IU/L (ref 0–40)
Albumin/Globulin Ratio: 2.5 — ABNORMAL HIGH (ref 1.2–2.2)
Albumin: 4.5 g/dL (ref 3.8–4.8)
Alkaline Phosphatase: 51 IU/L (ref 39–117)
BUN/Creatinine Ratio: 26 (ref 12–28)
BUN: 18 mg/dL (ref 8–27)
Bilirubin Total: 0.3 mg/dL (ref 0.0–1.2)
CO2: 24 mmol/L (ref 20–29)
Calcium: 9.5 mg/dL (ref 8.7–10.3)
Chloride: 100 mmol/L (ref 96–106)
Creatinine, Ser: 0.68 mg/dL (ref 0.57–1.00)
GFR calc Af Amer: 105 mL/min/{1.73_m2} (ref >59)
GFR calc non Af Amer: 91 mL/min/{1.73_m2} (ref >59)
Globulin, Total: 1.8 g/dL (ref 1.5–4.5)
Glucose: 110 mg/dL — ABNORMAL HIGH (ref 65–99)
Potassium: 4.5 mmol/L (ref 3.5–5.2)
Sodium: 139 mmol/L (ref 134–144)
Total Protein: 6.3 g/dL (ref 6.0–8.5)

## 2019-04-25 LAB — VITAMIN B12: Vitamin B-12: 1051 pg/mL (ref 232–1245)

## 2019-05-04 DIAGNOSIS — E113293 Type 2 diabetes mellitus with mild nonproliferative diabetic retinopathy without macular edema, bilateral: Secondary | ICD-10-CM | POA: Diagnosis not present

## 2019-05-04 LAB — HM DIABETES EYE EXAM

## 2019-05-23 ENCOUNTER — Other Ambulatory Visit: Payer: Self-pay

## 2019-05-23 ENCOUNTER — Ambulatory Visit (INDEPENDENT_AMBULATORY_CARE_PROVIDER_SITE_OTHER): Payer: Medicare Other | Admitting: Nurse Practitioner

## 2019-05-23 ENCOUNTER — Ambulatory Visit
Admission: RE | Admit: 2019-05-23 | Discharge: 2019-05-23 | Disposition: A | Discharge status: Home / Self Care | Payer: Medicare Other | Source: Ambulatory Visit | Attending: Nurse Practitioner | Admitting: Nurse Practitioner

## 2019-05-23 ENCOUNTER — Encounter: Payer: Self-pay | Admitting: Nurse Practitioner

## 2019-05-23 DIAGNOSIS — M85851 Other specified disorders of bone density and structure, right thigh: Secondary | ICD-10-CM | POA: Diagnosis not present

## 2019-05-23 DIAGNOSIS — E2839 Other primary ovarian failure: Secondary | ICD-10-CM | POA: Insufficient documentation

## 2019-05-23 DIAGNOSIS — E119 Type 2 diabetes mellitus without complications: Secondary | ICD-10-CM | POA: Diagnosis not present

## 2019-05-23 DIAGNOSIS — Z780 Asymptomatic menopausal state: Secondary | ICD-10-CM | POA: Diagnosis not present

## 2019-05-23 NOTE — Assessment & Plan Note (Signed)
Chronic, stable with BS continuing to run <130 throughout daytime hours.  Will maintain off Jardiance at this time and continue Ozempic and Metformin.  She would like to continue medication reduction in future if remains stable.  Continue diet focus at home.  Has follow-up scheduled for November, will obtain A1C then.

## 2019-05-23 NOTE — Patient Instructions (Signed)
Carbohydrate Counting for Diabetes Mellitus, Adult  Carbohydrate counting is a method of keeping track of how many carbohydrates you eat. Eating carbohydrates naturally increases the amount of sugar (glucose) in the blood. Counting how many carbohydrates you eat helps keep your blood glucose within normal limits, which helps you manage your diabetes (diabetes mellitus). It is important to know how many carbohydrates you can safely have in each meal. This is different for every person. A diet and nutrition specialist (registered dietitian) can help you make a meal plan and calculate how many carbohydrates you should have at each meal and snack. Carbohydrates are found in the following foods:  Grains, such as breads and cereals.  Dried beans and soy products.  Starchy vegetables, such as potatoes, peas, and corn.  Fruit and fruit juices.  Milk and yogurt.  Sweets and snack foods, such as cake, cookies, candy, chips, and soft drinks. How do I count carbohydrates? There are two ways to count carbohydrates in food. You can use either of the methods or a combination of both. Reading "Nutrition Facts" on packaged food The "Nutrition Facts" list is included on the labels of almost all packaged foods and beverages in the [place] It includes:  The serving size.  Information about nutrients in each serving, including the grams (g) of carbohydrate per serving. To use the "Nutrition Facts":  Decide how many servings you will have.  Multiply the number of servings by the number of carbohydrates per serving.  The resulting number is the total amount of carbohydrates that you will be having. Learning standard serving sizes of other foods When you eat carbohydrate foods that are not packaged or do not include "Nutrition Facts" on the label, you need to measure the servings in order to count the amount of carbohydrates:  Measure the foods that you will eat with a food scale or measuring cup, if needed.   Decide how many standard-size servings you will eat.  Multiply the number of servings by 15. Most carbohydrate-rich foods have about 15 g of carbohydrates per serving. ? For example, if you eat 8 oz (170 g) of strawberries, you will have eaten 2 servings and 30 g of carbohydrates (2 servings x 15 g = 30 g).  For foods that have more than one food mixed, such as soups and casseroles, you must count the carbohydrates in each food that is included. The following list contains standard serving sizes of common carbohydrate-rich foods. Each of these servings has about 15 g of carbohydrates:   hamburger bun or  English muffin.   oz (15 mL) syrup.   oz (14 g) jelly.  1 slice of bread.  1 six-inch tortilla.  3 oz (85 g) cooked rice or pasta.  4 oz (113 g) cooked dried beans.  4 oz (113 g) starchy vegetable, such as peas, corn, or potatoes.  4 oz (113 g) hot cereal.  4 oz (113 g) mashed potatoes or  of a large baked potato.  4 oz (113 g) canned or frozen fruit.  4 oz (120 mL) fruit juice.  4-6 crackers.  6 chicken nuggets.  6 oz (170 g) unsweetened dry cereal.  6 oz (170 g) plain fat-free yogurt or yogurt sweetened with artificial sweeteners.  8 oz (240 mL) milk.  8 oz (170 g) fresh fruit or one small piece of fruit.  24 oz (680 g) popped popcorn. Example of carbohydrate counting Sample meal  3 oz (85 g) chicken breast.  6 oz (170 g)   brown rice.  4 oz (113 g) corn.  8 oz (240 mL) milk.  8 oz (170 g) strawberries with sugar-free whipped topping. Carbohydrate calculation 1. Identify the foods that contain carbohydrates: ? Rice. ? Corn. ? Milk. ? Strawberries. 2. Calculate how many servings you have of each food: ? 2 servings rice. ? 1 serving corn. ? 1 serving milk. ? 1 serving strawberries. 3. Multiply each number of servings by 15 g: ? 2 servings rice x 15 g = 30 g. ? 1 serving corn x 15 g = 15 g. ? 1 serving milk x 15 g = 15 g. ? 1 serving  strawberries x 15 g = 15 g. 4. Add together all of the amounts to find the total grams of carbohydrates eaten: ? 30 g + 15 g + 15 g + 15 g = 75 g of carbohydrates total. Summary  Carbohydrate counting is a method of keeping track of how many carbohydrates you eat.  Eating carbohydrates naturally increases the amount of sugar (glucose) in the blood.  Counting how many carbohydrates you eat helps keep your blood glucose within normal limits, which helps you manage your diabetes.  A diet and nutrition specialist (registered dietitian) can help you make a meal plan and calculate how many carbohydrates you should have at each meal and snack. This information is not intended to replace advice given to you by your health care provider. Make sure you discuss any questions you have with your health care provider. Document Released: 09/06/2005 Document Revised: 03/31/2017 Document Reviewed: 02/18/2016 Elsevier Patient Education  2020 [employer]  

## 2019-05-23 NOTE — Progress Notes (Signed)
BP 118/69   Pulse 60   Temp 98.3 F (36.8 C) (Oral)   Wt 158 lb 12.8 oz (72 kg)   LMP 07/13/2000 (Approximate)   BMI 28.58 kg/m    Subjective:    Patient ID: Charlene Juarez, female    DOB: [birth year], 68 y.o.   MRN: [id number]  HPI: Charlene Juarez is a 68 y.o. female  Chief Complaint  Patient presents with  . Diabetes    4 week f/up    . This visit was completed via WebEx due to the restrictions of the COVID-19 pandemic. All issues as above were discussed and addressed. Physical exam was done as above through visual confirmation on WebEx. If it was felt that the patient should be evaluated in the office, they were directed there. The patient verbally consented to this visit. . Location of the patient: home . Location of the provider: home . Those involved with this call:  . Provider: Marnee Guarneri, DNP . CMA: Yvonna Alanis, CMA . Front Desk/Registration: Jill Side  . Time spent on call: 15 minutes with patient face to face via video conference. More than 50% of this time was spent in counseling and coordination of care. 10 minutes total spent in review of patient's record and preparation of their chart.  . I verified patient identity using two factors (patient name and date of birth). Patient consents verbally to being seen via telemedicine visit today.    DIABETES Recent A1C 5.6% and patient wanted to trial off Jardiance, but continue Ozempic 1 MG weekly and Metformin 1000MG  BID.  Has been off Jardiance x 4 weeks now and presents today for follow-up to assess blood sugar readings.  Continues to run below <130 on average throughout daytime.  Is continuing to focus on low carb and sugar diet at home.  No further episodes of BS in 70's. Hypoglycemic episodes:no Polydipsia/polyuria: no Visual disturbance: no Chest pain: no Paresthesias: no Glucose Monitoring: yes  Accucheck frequency: BID  Fasting glucose: 99 to 129 average  Post prandial: 99  after eating this morning, had one day of 129 after meal  Evening:   Before meals: Taking Insulin?: no  Long acting insulin:  Short acting insulin: Blood Pressure Monitoring: daily Retinal Examination: Up to Date Foot Exam: Up to Date Pneumovax: Up to Date Influenza: needed Aspirin: yes  Relevant past medical, surgical, family and social history reviewed and updated as indicated. Interim medical history since our last visit reviewed. Allergies and medications reviewed and updated.  Review of Systems  Constitutional: Negative for activity change, appetite change, diaphoresis, fatigue and fever.  Respiratory: Negative for cough, chest tightness and shortness of breath.   Cardiovascular: Negative for chest pain, palpitations and leg swelling.  Gastrointestinal: Negative for abdominal distention, abdominal pain, constipation, diarrhea, nausea and vomiting.  Endocrine: Negative for cold intolerance, heat intolerance, polydipsia, polyphagia and polyuria.  Neurological: Negative for dizziness, syncope, weakness, light-headedness, numbness and headaches.  Psychiatric/Behavioral: Negative.     Per HPI unless specifically indicated above     Objective:    BP 118/69   Pulse 60   Temp 98.3 F (36.8 C) (Oral)   Wt 158 lb 12.8 oz (72 kg)   LMP 07/13/2000 (Approximate)   BMI 28.58 kg/m   Wt Readings from Last 3 Encounters:  05/23/19 158 lb 12.8 oz (72 kg)  04/24/19 160 lb 6.4 oz (72.8 kg)  01/18/19 165 lb (74.8 kg)    Physical Exam Vitals signs and nursing note reviewed.  Constitutional:      General: She is awake. She is not in acute distress.    Appearance: She is well-developed. She is not ill-appearing.  HENT:     Head: Normocephalic.     Right Ear: Hearing normal.     Left Ear: Hearing normal.  Eyes:     General: Lids are normal.        Right eye: No discharge.        Left eye: No discharge.     Conjunctiva/sclera: Conjunctivae normal.  Neck:     Musculoskeletal:  Normal range of motion.  Pulmonary:     Effort: Pulmonary effort is normal. No accessory muscle usage or respiratory distress.  Neurological:     Mental Status: She is alert and oriented to person, place, and time.  Psychiatric:        Attention and Perception: Attention normal.        Mood and Affect: Mood normal.        Behavior: Behavior normal. Behavior is cooperative.        Thought Content: Thought content normal.        Judgment: Judgment normal.     Results for orders placed or performed in visit on 05/08/19  HM DIABETES EYE EXAM  Result Value Ref Range   HM Diabetic Eye Exam Retinopathy (A) No Retinopathy      Assessment & Plan:   Problem List Items Addressed This Visit      Endocrine   Type 2 diabetes mellitus without complications ([place])    Chronic, stable with BS continuing to run <130 throughout daytime hours.  Will maintain off Jardiance at this time and continue Ozempic and Metformin.  She would like to continue medication reduction in future if remains stable.  Continue diet focus at home.  Has follow-up scheduled for November, will obtain A1C then.         I discussed the assessment and treatment plan with the patient. The patient was provided an opportunity to ask questions and all were answered. The patient agreed with the plan and demonstrated an understanding of the instructions.   The patient was advised to call back or seek an in-person evaluation if the symptoms worsen or if the condition fails to improve as anticipated.   I provided 15 minutes of time during this encounter.  Follow up plan: Return for scheduled for November for her 3 month visit.

## 2019-05-29 ENCOUNTER — Telehealth: Payer: Self-pay | Admitting: Nurse Practitioner

## 2019-05-29 ENCOUNTER — Other Ambulatory Visit: Payer: Self-pay

## 2019-05-29 ENCOUNTER — Ambulatory Visit (INDEPENDENT_AMBULATORY_CARE_PROVIDER_SITE_OTHER): Payer: Medicare Other

## 2019-05-29 DIAGNOSIS — Z23 Encounter for immunization: Secondary | ICD-10-CM | POA: Diagnosis not present

## 2019-05-29 NOTE — Telephone Encounter (Signed)
Pt called in to schedule flu shot and wants to see if she is able to get a pneumonia shot. Please advise.

## 2019-05-29 NOTE — Telephone Encounter (Signed)
Patient received flu shot and pneumonia shot today.

## 2019-06-11 ENCOUNTER — Other Ambulatory Visit: Payer: Self-pay | Admitting: Nurse Practitioner

## 2019-06-11 DIAGNOSIS — Z1231 Encounter for screening mammogram for malignant neoplasm of breast: Secondary | ICD-10-CM

## 2019-06-17 ENCOUNTER — Other Ambulatory Visit: Payer: Self-pay | Admitting: Nurse Practitioner

## 2019-06-19 ENCOUNTER — Other Ambulatory Visit: Payer: Self-pay | Admitting: Nurse Practitioner

## 2019-07-02 ENCOUNTER — Other Ambulatory Visit: Payer: Self-pay | Admitting: Nurse Practitioner

## 2019-07-02 MED ORDER — JARDIANCE 25 MG PO TABS: 25.0000 mg | ORAL_TABLET | Freq: Every day | ORAL | 1 refills | Status: DC

## 2019-07-02 NOTE — Telephone Encounter (Signed)
Will contact patient via her [place]

## 2019-07-02 NOTE — Progress Notes (Signed)
Jardiance refill, patient with GI issues with Ozempic.  Will trial off Ozempic and return to [place] with Metformin.  Last A1C 5.6%.

## 2019-07-02 NOTE — Telephone Encounter (Signed)
Copied from [place] [id number]. Topic: General - Inquiry >> Jul 02, 2019  9:33 AM Percell Belt A wrote: Reason for CRM: pt called in and stated that the Semaglutide, 1 MG/DOSE, (OZEMPIC, 1 MG/DOSE,) 2 MG/1.5ML SOPN [id number] is causing her to be constipated all the time and would like to know if there is a different med she can take that will not make her constipated?  Please advise  Best number [phone]

## 2019-07-10 ENCOUNTER — Other Ambulatory Visit: Payer: Self-pay | Admitting: Family Medicine

## 2019-07-24 ENCOUNTER — Other Ambulatory Visit: Payer: Self-pay

## 2019-07-25 ENCOUNTER — Other Ambulatory Visit: Payer: Self-pay

## 2019-07-25 ENCOUNTER — Ambulatory Visit (INDEPENDENT_AMBULATORY_CARE_PROVIDER_SITE_OTHER): Payer: Medicare Other | Admitting: Nurse Practitioner

## 2019-07-25 ENCOUNTER — Encounter: Payer: Self-pay | Admitting: Nurse Practitioner

## 2019-07-25 VITALS — BP 122/73 | HR 69 | Temp 98.1°F | Ht 62.0 in | Wt 157.0 lb

## 2019-07-25 DIAGNOSIS — E119 Type 2 diabetes mellitus without complications: Secondary | ICD-10-CM | POA: Diagnosis not present

## 2019-07-25 DIAGNOSIS — E785 Hyperlipidemia, unspecified: Secondary | ICD-10-CM

## 2019-07-25 DIAGNOSIS — E1169 Type 2 diabetes mellitus with other specified complication: Secondary | ICD-10-CM | POA: Diagnosis not present

## 2019-07-25 DIAGNOSIS — Z6832 Body mass index (BMI) 32.0-32.9, adult: Secondary | ICD-10-CM

## 2019-07-25 DIAGNOSIS — E6609 Other obesity due to excess calories: Secondary | ICD-10-CM | POA: Diagnosis not present

## 2019-07-25 DIAGNOSIS — I10 Essential (primary) hypertension: Secondary | ICD-10-CM | POA: Diagnosis not present

## 2019-07-25 LAB — BAYER DCA HB A1C WAIVED: HB A1C (BAYER DCA - WAIVED): 5.5 % (ref <7.0)

## 2019-07-25 NOTE — Assessment & Plan Note (Signed)
Chronic, stable with BP at goal.  Continue current medication regimen, Micardis for kidney protection, and adjust as needed.  CMP today.   

## 2019-07-25 NOTE — Assessment & Plan Note (Signed)
Chronic, ongoing.  Continue current medication regimen and adjust as needed. Lipid panel today. 

## 2019-07-25 NOTE — Assessment & Plan Note (Signed)
Chronic, stable with A1C continuing downward trend at 5.5% today.  Will trial off Jardiance per patient request and continue Metformin only.  To notify provider if sugars consistently read > 130 and then will restart Jardiance.  Continue diet and exercise focus.  Return in 3 months.

## 2019-07-25 NOTE — Progress Notes (Signed)
BP 122/73   Pulse 69   Temp 98.1 F (36.7 C) (Oral)   Ht 5\' 2"  (1.575 m)   Wt 157 lb (71.2 kg)   LMP 07/13/2000 (Approximate)   SpO2 98%   BMI 28.72 kg/m    Subjective:    Patient ID: Charlene Juarez, female    DOB: 1951/07/02, 68 y.o.   MRN: [id number]  HPI: Charlene Juarez is a 68 y.o. female  Chief Complaint  Patient presents with  . Diabetes  . Hypertension  . Hyperlipidemia   DIABETES Continues on Metformin and Jardiance with Ozempic discontinued at recent visit with A1C 5.6%.  Continues to focus on diet changes with reduction in carbs.  She has cut out all sodas and cookies.  History of low B12 level with Metformin long term use, is taking daily supplement.  This was improved on recent labs. Hypoglycemic episodes:no Polydipsia/polyuria: no Visual disturbance: no Chest pain: no Paresthesias: no Glucose Monitoring: yes  Accucheck frequency: Daily  Fasting glucose: 100 to 110, had one 160 after eating dates  Post prandial:  Evening:  Before meals: Taking Insulin?: no  Long acting insulin:  Short acting insulin: Blood Pressure Monitoring: not checking Retinal Examination: Not up to Date Foot Exam: Up to Date Pneumovax: Up to Date Influenza: Up to Date Aspirin: no   HYPERTENSION / HYPERLIPIDEMIA Continues on Micardis, HCTZ, and Simvastatin. Satisfied with current treatment? yes Duration of hypertension: chronic BP monitoring frequency: not checking BP range:  BP medication side effects: no Duration of hyperlipidemia: chronic Cholesterol medication side effects: no Cholesterol supplements: none Medication compliance: good compliance Aspirin: no Recent stressors: no Recurrent headaches: no Visual changes: no Palpitations: no Dyspnea: no Chest pain: no Lower extremity edema: no Dizzy/lightheaded: no  Relevant past medical, surgical, family and social history reviewed and updated as indicated. Interim medical history since our last  visit reviewed. Allergies and medications reviewed and updated.  Review of Systems  Constitutional: Negative for activity change, appetite change, diaphoresis, fatigue and fever.  Respiratory: Negative for cough, chest tightness, shortness of breath and wheezing.   Cardiovascular: Negative for chest pain, palpitations and leg swelling.  Gastrointestinal: Negative for abdominal distention, abdominal pain, constipation, diarrhea, nausea and vomiting.  Endocrine: Negative for cold intolerance, heat intolerance, polydipsia, polyphagia and polyuria.  Neurological: Negative for dizziness, syncope, weakness, light-headedness, numbness and headaches.  Psychiatric/Behavioral: Negative.     Per HPI unless specifically indicated above     Objective:    BP 122/73   Pulse 69   Temp 98.1 F (36.7 C) (Oral)   Ht 5\' 2"  (1.575 m)   Wt 157 lb (71.2 kg)   LMP 07/13/2000 (Approximate)   SpO2 98%   BMI 28.72 kg/m   Wt Readings from Last 3 Encounters:  07/25/19 157 lb (71.2 kg)  05/23/19 158 lb 12.8 oz (72 kg)  04/24/19 160 lb 6.4 oz (72.8 kg)    Physical Exam Vitals signs and nursing note reviewed.  Constitutional:      General: She is awake. She is not in acute distress.    Appearance: She is well-developed. She is not ill-appearing.  HENT:     Head: Normocephalic.     Right Ear: Hearing normal.     Left Ear: Hearing normal.     Nose: Nose normal.     Mouth/Throat:     Mouth: Mucous membranes are moist.  Eyes:     General: Lids are normal.        Right  eye: No discharge.        Left eye: No discharge.     Conjunctiva/sclera: Conjunctivae normal.     Pupils: Pupils are equal, round, and reactive to light.  Neck:     Musculoskeletal: Normal range of motion and neck supple.     Thyroid: No thyromegaly.     Vascular: No carotid bruit.  Cardiovascular:     Rate and Rhythm: Normal rate and regular rhythm.     Heart sounds: Normal heart sounds. No murmur. No gallop.   Pulmonary:      Effort: Pulmonary effort is normal. No accessory muscle usage or respiratory distress.     Breath sounds: Normal breath sounds.  Abdominal:     General: Bowel sounds are normal.     Palpations: Abdomen is soft.     Tenderness: There is no abdominal tenderness.  Musculoskeletal:     Right lower leg: No edema.     Left lower leg: No edema.  Lymphadenopathy:     Cervical: No cervical adenopathy.  Skin:    General: Skin is warm and dry.  Neurological:     Mental Status: She is alert and oriented to person, place, and time.  Psychiatric:        Attention and Perception: Attention normal.        Mood and Affect: Mood normal.        Speech: Speech normal.        Behavior: Behavior normal. Behavior is cooperative.        Thought Content: Thought content normal.        Judgment: Judgment normal.     Results for orders placed or performed in visit on 05/08/19  HM DIABETES EYE EXAM  Result Value Ref Range   HM Diabetic Eye Exam Retinopathy (A) No Retinopathy      Assessment & Plan:   Problem List Items Addressed This Visit      Cardiovascular and Mediastinum   Essential hypertension    Chronic, stable with BP at goal.  Continue current medication regimen, Micardis for kidney protection, and adjust as needed.  CMP today.          Endocrine   Type 2 diabetes mellitus without complications ([facility]) - Primary    Chronic, stable with A1C continuing downward trend at 5.5% today.  Will trial off Jardiance per patient request and continue Metformin only.  To notify provider if sugars consistently read > 130 and then will restart Jardiance.  Continue diet and exercise focus.  Return in 3 months.      Relevant Orders   Bayer DCA Hb A1c Waived   Hyperlipidemia associated with type 2 diabetes mellitus ([facility])    Chronic, ongoing.  Continue current medication regimen and adjust as needed.  Lipid panel today.      Relevant Orders   Bayer DCA Hb A1c Waived   Comprehensive metabolic panel    Lipid Panel w/o Chol/HDL Ratio     Other   Obesity    Praised for continued weight loss success.          Follow up plan: Return in about 3 months (around 10/25/2019) for T2DM, HTN/HLD.

## 2019-07-25 NOTE — Patient Instructions (Signed)
Carbohydrate Counting for Diabetes Mellitus, Adult  Carbohydrate counting is a method of keeping track of how many carbohydrates you eat. Eating carbohydrates naturally increases the amount of sugar (glucose) in the blood. Counting how many carbohydrates you eat helps keep your blood glucose within normal limits, which helps you manage your diabetes (diabetes mellitus). It is important to know how many carbohydrates you can safely have in each meal. This is different for every person. A diet and nutrition specialist (registered dietitian) can help you make a meal plan and calculate how many carbohydrates you should have at each meal and snack. Carbohydrates are found in the following foods:  Grains, such as breads and cereals.  Dried beans and soy products.  Starchy vegetables, such as potatoes, peas, and corn.  Fruit and fruit juices.  Milk and yogurt.  Sweets and snack foods, such as cake, cookies, candy, chips, and soft drinks. How do I count carbohydrates? There are two ways to count carbohydrates in food. You can use either of the methods or a combination of both. Reading "Nutrition Facts" on packaged food The "Nutrition Facts" list is included on the labels of almost all packaged foods and beverages in the [place] It includes:  The serving size.  Information about nutrients in each serving, including the grams (g) of carbohydrate per serving. To use the "Nutrition Facts":  Decide how many servings you will have.  Multiply the number of servings by the number of carbohydrates per serving.  The resulting number is the total amount of carbohydrates that you will be having. Learning standard serving sizes of other foods When you eat carbohydrate foods that are not packaged or do not include "Nutrition Facts" on the label, you need to measure the servings in order to count the amount of carbohydrates:  Measure the foods that you will eat with a food scale or measuring cup, if needed.   Decide how many standard-size servings you will eat.  Multiply the number of servings by 15. Most carbohydrate-rich foods have about 15 g of carbohydrates per serving. ? For example, if you eat 8 oz (170 g) of strawberries, you will have eaten 2 servings and 30 g of carbohydrates (2 servings x 15 g = 30 g).  For foods that have more than one food mixed, such as soups and casseroles, you must count the carbohydrates in each food that is included. The following list contains standard serving sizes of common carbohydrate-rich foods. Each of these servings has about 15 g of carbohydrates:   hamburger bun or  English muffin.   oz (15 mL) syrup.   oz (14 g) jelly.  1 slice of bread.  1 six-inch tortilla.  3 oz (85 g) cooked rice or pasta.  4 oz (113 g) cooked dried beans.  4 oz (113 g) starchy vegetable, such as peas, corn, or potatoes.  4 oz (113 g) hot cereal.  4 oz (113 g) mashed potatoes or  of a large baked potato.  4 oz (113 g) canned or frozen fruit.  4 oz (120 mL) fruit juice.  4-6 crackers.  6 chicken nuggets.  6 oz (170 g) unsweetened dry cereal.  6 oz (170 g) plain fat-free yogurt or yogurt sweetened with artificial sweeteners.  8 oz (240 mL) milk.  8 oz (170 g) fresh fruit or one small piece of fruit.  24 oz (680 g) popped popcorn. Example of carbohydrate counting Sample meal  3 oz (85 g) chicken breast.  6 oz (170 g)   brown rice.  4 oz (113 g) corn.  8 oz (240 mL) milk.  8 oz (170 g) strawberries with sugar-free whipped topping. Carbohydrate calculation 1. Identify the foods that contain carbohydrates: ? Rice. ? Corn. ? Milk. ? Strawberries. 2. Calculate how many servings you have of each food: ? 2 servings rice. ? 1 serving corn. ? 1 serving milk. ? 1 serving strawberries. 3. Multiply each number of servings by 15 g: ? 2 servings rice x 15 g = 30 g. ? 1 serving corn x 15 g = 15 g. ? 1 serving milk x 15 g = 15 g. ? 1 serving  strawberries x 15 g = 15 g. 4. Add together all of the amounts to find the total grams of carbohydrates eaten: ? 30 g + 15 g + 15 g + 15 g = 75 g of carbohydrates total. Summary  Carbohydrate counting is a method of keeping track of how many carbohydrates you eat.  Eating carbohydrates naturally increases the amount of sugar (glucose) in the blood.  Counting how many carbohydrates you eat helps keep your blood glucose within normal limits, which helps you manage your diabetes.  A diet and nutrition specialist (registered dietitian) can help you make a meal plan and calculate how many carbohydrates you should have at each meal and snack. This information is not intended to replace advice given to you by your health care provider. Make sure you discuss any questions you have with your health care provider. Document Released: 09/06/2005 Document Revised: 03/31/2017 Document Reviewed: 02/18/2016 Elsevier Patient Education  2020 [employer]  

## 2019-07-25 NOTE — Assessment & Plan Note (Signed)
Praised for continued weight loss success.

## 2019-07-26 LAB — COMPREHENSIVE METABOLIC PANEL
ALT: 81 IU/L — ABNORMAL HIGH (ref 0–32)
AST: 39 IU/L (ref 0–40)
Albumin/Globulin Ratio: 2 (ref 1.2–2.2)
Albumin: 4.7 g/dL (ref 3.8–4.8)
Alkaline Phosphatase: 71 IU/L (ref 39–117)
BUN/Creatinine Ratio: 32 — ABNORMAL HIGH (ref 12–28)
BUN: 21 mg/dL (ref 8–27)
Bilirubin Total: 0.3 mg/dL (ref 0.0–1.2)
CO2: 22 mmol/L (ref 20–29)
Calcium: 9.7 mg/dL (ref 8.7–10.3)
Chloride: 98 mmol/L (ref 96–106)
Creatinine, Ser: 0.65 mg/dL (ref 0.57–1.00)
GFR calc Af Amer: 106 mL/min/{1.73_m2} (ref >59)
GFR calc non Af Amer: 92 mL/min/{1.73_m2} (ref >59)
Globulin, Total: 2.4 g/dL (ref 1.5–4.5)
Glucose: 113 mg/dL — ABNORMAL HIGH (ref 65–99)
Potassium: 4.5 mmol/L (ref 3.5–5.2)
Sodium: 138 mmol/L (ref 134–144)
Total Protein: 7.1 g/dL (ref 6.0–8.5)

## 2019-07-26 LAB — LIPID PANEL W/O CHOL/HDL RATIO
Cholesterol, Total: 150 mg/dL (ref 100–199)
HDL: 61 mg/dL (ref >39)
LDL Chol Calc (NIH): 54 mg/dL (ref 0–99)
Triglycerides: 224 mg/dL — ABNORMAL HIGH (ref 0–149)
VLDL Cholesterol Cal: 35 mg/dL (ref 5–40)

## 2019-07-27 ENCOUNTER — Ambulatory Visit
Admission: RE | Admit: 2019-07-27 | Discharge: 2019-07-27 | Disposition: A | Discharge status: Home / Self Care | Payer: Medicare Other | Source: Ambulatory Visit | Attending: Nurse Practitioner | Admitting: Nurse Practitioner

## 2019-07-27 DIAGNOSIS — Z1231 Encounter for screening mammogram for malignant neoplasm of breast: Secondary | ICD-10-CM | POA: Insufficient documentation

## 2019-09-02 ENCOUNTER — Other Ambulatory Visit: Payer: Self-pay | Admitting: Family Medicine

## 2019-09-02 ENCOUNTER — Other Ambulatory Visit: Payer: Self-pay | Admitting: Nurse Practitioner

## 2019-09-06 ENCOUNTER — Other Ambulatory Visit: Payer: Self-pay | Admitting: Nurse Practitioner

## 2019-10-25 ENCOUNTER — Ambulatory Visit: Payer: Medicare Other

## 2019-10-31 ENCOUNTER — Other Ambulatory Visit: Payer: Self-pay

## 2019-10-31 ENCOUNTER — Ambulatory Visit (INDEPENDENT_AMBULATORY_CARE_PROVIDER_SITE_OTHER): Payer: Medicare Other

## 2019-10-31 ENCOUNTER — Encounter: Payer: Self-pay | Admitting: Nurse Practitioner

## 2019-10-31 ENCOUNTER — Ambulatory Visit (INDEPENDENT_AMBULATORY_CARE_PROVIDER_SITE_OTHER): Payer: Medicare Other | Admitting: Nurse Practitioner

## 2019-10-31 ENCOUNTER — Ambulatory Visit: Payer: Medicare Other | Admitting: Nurse Practitioner

## 2019-10-31 VITALS — BP 133/76 | HR 85 | Temp 97.8°F

## 2019-10-31 VITALS — BP 133/76 | HR 85 | Temp 97.8°F | Ht 62.0 in | Wt 162.0 lb

## 2019-10-31 DIAGNOSIS — E6609 Other obesity due to excess calories: Secondary | ICD-10-CM | POA: Diagnosis not present

## 2019-10-31 DIAGNOSIS — Z6832 Body mass index (BMI) 32.0-32.9, adult: Secondary | ICD-10-CM

## 2019-10-31 DIAGNOSIS — E559 Vitamin D deficiency, unspecified: Secondary | ICD-10-CM

## 2019-10-31 DIAGNOSIS — I10 Essential (primary) hypertension: Secondary | ICD-10-CM

## 2019-10-31 DIAGNOSIS — Z0000 Encounter for general adult medical examination without abnormal findings: Secondary | ICD-10-CM | POA: Diagnosis not present

## 2019-10-31 DIAGNOSIS — M85851 Other specified disorders of bone density and structure, right thigh: Secondary | ICD-10-CM | POA: Insufficient documentation

## 2019-10-31 DIAGNOSIS — E1159 Type 2 diabetes mellitus with other circulatory complications: Secondary | ICD-10-CM

## 2019-10-31 DIAGNOSIS — E538 Deficiency of other specified B group vitamins: Secondary | ICD-10-CM | POA: Diagnosis not present

## 2019-10-31 DIAGNOSIS — E1169 Type 2 diabetes mellitus with other specified complication: Secondary | ICD-10-CM

## 2019-10-31 DIAGNOSIS — E785 Hyperlipidemia, unspecified: Secondary | ICD-10-CM

## 2019-10-31 DIAGNOSIS — E119 Type 2 diabetes mellitus without complications: Secondary | ICD-10-CM

## 2019-10-31 DIAGNOSIS — M8580 Other specified disorders of bone density and structure, unspecified site: Secondary | ICD-10-CM | POA: Insufficient documentation

## 2019-10-31 LAB — BAYER DCA HB A1C WAIVED: HB A1C (BAYER DCA - WAIVED): 6.2 % (ref <7.0)

## 2019-10-31 LAB — MICROALBUMIN, URINE WAIVED
Creatinine, Urine Waived: 200 mg/dL (ref 10–300)
Microalb, Ur Waived: 10 mg/L (ref 0–19)
Microalb/Creat Ratio: <30 mg/g (ref <30)

## 2019-10-31 NOTE — Assessment & Plan Note (Signed)
Chronic, ongoing.  Continue current medication regimen and adjust as needed. Lipid panel today. 

## 2019-10-31 NOTE — Assessment & Plan Note (Signed)
Recheck B12 level today.  Restart supplement as needed. 

## 2019-10-31 NOTE — Progress Notes (Signed)
Subjective:   Charlene Juarez is a 69 y.o. female who presents for Medicare Annual (Subsequent) preventive examination.  Review of Systems:   Cardiac Risk Factors include: advanced age (>35men, >86 women);dyslipidemia;hypertension;diabetes mellitus     Objective:     Vitals: BP 133/76   Pulse 85   Temp 97.8 F (36.6 C)   Ht 5\' 2"  (1.575 m)   Wt 162 lb (73.5 kg)   LMP 07/13/2000 (Approximate)   SpO2 98%   BMI 29.63 kg/m   Body mass index is 29.63 kg/m.  Advanced Directives 10/31/2019 10/19/2018 10/14/2017 01/13/2017  Does Patient Have a Medical Advance Directive? Yes Yes Yes Yes  Type of Advance Directive Living will;Healthcare Power of [place];Living will Living will Living will  Does patient want to make changes to medical advance directive? - - - No - Patient declined  Copy of [place] in Chart? No - copy requested No - copy requested - -    Tobacco Social History   Tobacco Use  Smoking Status Never Smoker  Smokeless Tobacco Never Used     Counseling given: Not Answered   Clinical Intake:  Pre-visit preparation completed: Yes  Pain : No/denies pain     Nutritional Risks: None Diabetes: Yes CBG done?: No Did pt. bring in CBG monitor from home?: No  How often do you need to have someone help you when you read instructions, pamphlets, or other written materials from your doctor or pharmacy?: 1 - Never  Interpreter Needed?: No  Information entered by :: Jahking Lesser,LPN  Past Medical History:  Diagnosis Date  . Arthritis   . Bursitis   . Cancer ([employer])    SKIN  . Diabetes mellitus without complication ([place])   . GERD (gastroesophageal reflux disease)   . Hyperlipidemia   . Hypertension   . Sleep apnea    Past Surgical History:  Procedure Laterality Date  . cancer removal off nose    . CATARACT EXTRACTION W/PHACO Right 01/13/2017   Procedure: CATARACT EXTRACTION PHACO AND INTRAOCULAR LENS  PLACEMENT (IOC);  Surgeon: Eulogio Bear, MD;  Location: [facility] ORS;  Service: Ophthalmology;  Laterality: Right;  [place] 31.1 AP% 0.0 CDE 1.78 Fluid Pack lot # [id number]  . CATARACT EXTRACTION W/PHACO Left 02/24/2017   Procedure: CATARACT EXTRACTION PHACO AND INTRAOCULAR LENS PLACEMENT (IOC);  Surgeon: Eulogio Bear, MD;  Location: [facility] ORS;  Service: Ophthalmology;  Laterality: Left;  [place]  00:45.5 AP  0.7 CDE   2.76  fluid pack lot # [id number]  exp. 08-19-2018  . DILATION AND CURETTAGE OF UTERUS    . TUBAL LIGATION     Family History  Problem Relation Age of Onset  . Alcohol abuse Mother   . Heart disease Father   . Alcohol abuse Father   . Diabetes Sister   . Hyperlipidemia Sister   . Diabetes Brother   . Hypertension Brother   . Thyroid disease Daughter   . Diabetes Maternal Grandmother   . Hypertension Sister   . Aneurysm Sister   . Breast cancer Maternal Aunt    Social History   Socioeconomic History  . Marital status: Married    Spouse name: Not on file  . Number of children: 4  . Years of education: Not on file  . Highest education level: Associate degree: occupational, Hotel manager, or vocational program  Occupational History  . Occupation: retired /disabled  Tobacco Use  . Smoking status: Never Smoker  . Smokeless  tobacco: Never Used  Substance and Sexual Activity  . Alcohol use: No    Alcohol/week: 0.0 standard drinks  . Drug use: No  . Sexual activity: Yes  Other Topics Concern  . Not on file  Social History Narrative  . Not on file   Social Determinants of Health   Financial Resource Strain:   . Difficulty of Paying Living Expenses: Not on file  Food Insecurity:   . Worried About Charity fundraiser in the Last Year: Not on file  . Ran Out of Food in the Last Year: Not on file  Transportation Needs:   . Lack of Transportation (Medical): Not on file  . Lack of Transportation (Non-Medical): Not on file  Physical Activity:   . Days of Exercise per  Week: Not on file  . Minutes of Exercise per Session: Not on file  Stress:   . Feeling of Stress : Not on file  Social Connections:   . Frequency of Communication with Friends and Family: Not on file  . Frequency of Social Gatherings with Friends and Family: Not on file  . Attends Religious Services: Not on file  . Active Member of Clubs or Organizations: Not on file  . Attends Archivist Meetings: Not on file  . Marital Status: Not on file    Outpatient Encounter Medications as of 10/31/2019  Medication Sig  . Ascorbic Acid (VITAMIN C) 1000 MG tablet Take 1,000 mg by mouth daily.  . Cholecalciferol (VITAMIN D3) 1000 units CAPS Take 1,000 Units by mouth at bedtime.  . empagliflozin (JARDIANCE) 25 MG TABS tablet Take 25 mg by mouth daily.  Marland Kitchen glucose blood (BAYER CONTOUR TEST) test strip Contour test strips ( or insurance preferred) used to check blood sugar BID. DX: E11.9  . glucose blood test strip 1 each by Other route as needed for other. Use as instructed  . hydrochlorothiazide (HYDRODIURIL) 25 MG tablet TAKE 1 TABLET BY MOUTH EVERY DAY  . Magnesium 500 MG TABS Take 500 mg by mouth at bedtime.  Marland Kitchen MAGNESIUM PO Take by mouth daily.  . metFORMIN (GLUCOPHAGE) 1000 MG tablet TAKE 1 TABLET (1,000 MG TOTAL) BY MOUTH 2 (TWO) TIMES DAILY WITH A MEAL.  Marland Kitchen Omega-3 Fatty Acids (FISH OIL) 1200 MG CAPS Take 1,200-2,400 mg by mouth 2 (two) times daily. 1 in the am and 2 at night  . Semaglutide (OZEMPIC, 1 MG/DOSE, [place]) Inject 1 mg into the skin once a week.  . simvastatin (ZOCOR) 20 MG tablet TAKE 1 TABLET BY MOUTH EVERY DAY  . SYMBICORT 160-4.5 MCG/ACT inhaler TAKE 2 PUFFS BY MOUTH TWICE A DAY  . telmisartan (MICARDIS) 80 MG tablet TAKE 1 TABLET BY MOUTH EVERY DAY  . vitamin E 200 UNIT capsule Take 200 Units by mouth daily.  . diphenhydrAMINE (BENADRYL) 25 MG tablet Take 50 mg by mouth at bedtime as needed for sleep.  Marland Kitchen ondansetron (ZOFRAN) 4 MG tablet TAKE 1 TABLET BY MOUTH EVERY 8 HOURS  AS NEEDED FOR NAUSEA AND VOMITING (Patient not taking: Reported on 10/31/2019)  . VENTOLIN HFA 108 (90 Base) MCG/ACT inhaler TAKE 2 PUFFS BY MOUTH EVERY 6 HOURS AS NEEDED FOR WHEEZE OR SHORTNESS OF BREATH (Patient not taking: Reported on 10/31/2019)   No facility-administered encounter medications on file as of 10/31/2019.    Activities of Daily Living In your present state of health, do you have any difficulty performing the following activities: 10/31/2019  Hearing? N  Comment no hearing aids  Vision?  N  Comment reading glasses  Difficulty concentrating or making decisions? N  Walking or climbing stairs? N  Dressing or bathing? N  Doing errands, shopping? N  Preparing Food and eating ? N  Using the Toilet? N  In the past six months, have you accidently leaked urine? N  Do you have problems with loss of bowel control? N  Managing your Medications? N  Managing your Finances? N  Housekeeping or managing your Housekeeping? N  Some recent data might be hidden    Patient Care Team: Venita Lick, NP as PCP - General (Nurse Practitioner) Oneta Rack, MD (Dermatology)    Assessment:   This is a routine wellness examination for [place].  Exercise Activities and Dietary recommendations Current Exercise Habits: The patient does not participate in regular exercise at present, Exercise limited by: None identified  Goals    . DIET - INCREASE WATER INTAKE     Recommend drinking at least 6-8 glasses of water a day     . DIET - REDUCE SUGAR INTAKE     Continue to cut out all sweets and desserts in diet to help aid in weight loss.        Fall Risk: Fall Risk  10/31/2019 10/19/2018 04/18/2018 10/14/2017 04/19/2017  Falls in the past year? 0 0 No No No  Number falls in past yr: 0 - - - -  Injury with Fall? 0 - - - -    FALL RISK PREVENTION PERTAINING TO THE HOME:  Any stairs in or around the home? No  If so, are there any without handrails? No   Home free of loose throw rugs  in walkways, pet beds, electrical cords, etc? Yes  Adequate lighting in your home to reduce risk of falls? Yes   ASSISTIVE DEVICES UTILIZED TO PREVENT FALLS:  Life alert? No  Use of a cane, walker or w/c? No  Grab bars in the bathroom? No  Shower chair or bench in shower? No  Elevated toilet seat or a handicapped toilet? No   DME ORDERS:  DME order needed?  No   TIMED UP AND GO:  Was the test performed? Yes .  Length of time to ambulate 10 feet: 8 sec.   GAIT:  Appearance of gait: Gait steady and fast without the use of an assistive device.  Education: Fall risk prevention has been discussed.  Intervention(s) required? No   DME/home health order needed?  No    Depression Screen PHQ 2/9 Scores 10/31/2019 10/19/2018 04/18/2018 10/14/2017  PHQ - 2 Score 0 0 0 0  PHQ- 9 Score - - 0 -     Cognitive Function     6CIT Screen 10/31/2019  What Year? 0 points  What month? 0 points  What time? 0 points  Count back from 20 0 points  Months in reverse 0 points  Repeat phrase 0 points  Total Score 0    Immunization History  Administered Date(s) Administered  . Fluad Quad(high Dose 65+) 05/29/2019  . Influenza, High Dose Seasonal PF 07/15/2017, 07/27/2018  . Influenza-Unspecified 06/21/2016  . Pneumococcal Conjugate-13 07/15/2017  . Pneumococcal Polysaccharide-23 05/22/2014, 05/29/2019  . Tdap 03/04/2008  . Zoster 02/26/2012  . Zoster Recombinat (Shingrix) 01/31/2018, 05/02/2018, 05/04/2019    Qualifies for Shingles Vaccine? Yes  shingrix completed   Tdap: Discussed need for TD/TDAP vaccine, patient verbalized understanding that this is not covered as a preventative with there insurance and to call the office if she develops any new  skin injuries, ie: cuts, scrapes, bug bites, or open wounds.   Flu Vaccine: up to date   Pneumococcal Vaccine: up to date    Covid-19 Vaccine: declined   Screening Tests Health Maintenance  Topic Date Due  . TETANUS/TDAP  04/23/2020  (Originally 03/04/2018)  . HEMOGLOBIN A1C  01/22/2020  . OPHTHALMOLOGY EXAM  05/03/2020  . FOOT EXAM  10/30/2020  . MAMMOGRAM  07/26/2021  . COLONOSCOPY  09/27/2022  . INFLUENZA VACCINE  Completed  . DEXA SCAN  Completed  . Hepatitis C Screening  Completed  . PNA vac Low Risk Adult  Completed    Cancer Screenings:  Colorectal Screening: Completed 09/27/2012. Repeat every 10 years  Mammogram: Completed 07/27/2019. Repeat every year  Bone Density: Completed 05/23/2019.   Lung Cancer Screening: (Low Dose CT Chest recommended if Age 35-80 years, 30 pack-year currently smoking OR have quit w/in 15years.) does not qualify.    Additional Screening:  Hepatitis C Screening: does qualify; Completed 10/14/2017  Vision Screening: Recommended annual ophthalmology exams for early detection of glaucoma and other disorders of the eye. Is the patient up to date with their annual eye exam?  Yes  Who is the provider or what is the name of the office in which the pt attends annual eye exams? [facility]    Dental Screening: Recommended annual dental exams for proper oral hygiene  Community Resource Referral:  CRR required this visit?  No       Plan:  I have personally reviewed and addressed the Medicare Annual Wellness questionnaire and have noted the following in the patient's chart:  A. Medical and social history B. Use of alcohol, tobacco or illicit drugs  C. Current medications and supplements D. Functional ability and status E.  Nutritional status F.  Physical activity G. Advance directives H. List of other physicians I.  Hospitalizations, surgeries, and ER visits in previous 12 months J.  [place] such as hearing and vision if needed, cognitive and depression L. Referrals and appointments   In addition, I have reviewed and discussed with patient certain preventive protocols, quality metrics, and best practice recommendations. A written personalized care plan for  preventive services as well as general preventive health recommendations were provided to patient.  Signed,    Bevelyn Ngo, LPN  [id number] Nurse Health Advisor   Nurse Notes: none

## 2019-10-31 NOTE — Assessment & Plan Note (Signed)
Continue current supplement and plan for DEXA scan repeat in 2025 due to osteopenia..  Check Vit d today.

## 2019-10-31 NOTE — Patient Instructions (Signed)
Carbohydrate Counting for Diabetes Mellitus, Adult  Carbohydrate counting is a method of keeping track of how many carbohydrates you eat. Eating carbohydrates naturally increases the amount of sugar (glucose) in the blood. Counting how many carbohydrates you eat helps keep your blood glucose within normal limits, which helps you manage your diabetes (diabetes mellitus). It is important to know how many carbohydrates you can safely have in each meal. This is different for every person. A diet and nutrition specialist (registered dietitian) can help you make a meal plan and calculate how many carbohydrates you should have at each meal and snack. Carbohydrates are found in the following foods:  Grains, such as breads and cereals.  Dried beans and soy products.  Starchy vegetables, such as potatoes, peas, and corn.  Fruit and fruit juices.  Milk and yogurt.  Sweets and snack foods, such as cake, cookies, candy, chips, and soft drinks. How do I count carbohydrates? There are two ways to count carbohydrates in food. You can use either of the methods or a combination of both. Reading "Nutrition Facts" on packaged food The "Nutrition Facts" list is included on the labels of almost all packaged foods and beverages in the [place] It includes:  The serving size.  Information about nutrients in each serving, including the grams (g) of carbohydrate per serving. To use the "Nutrition Facts":  Decide how many servings you will have.  Multiply the number of servings by the number of carbohydrates per serving.  The resulting number is the total amount of carbohydrates that you will be having. Learning standard serving sizes of other foods When you eat carbohydrate foods that are not packaged or do not include "Nutrition Facts" on the label, you need to measure the servings in order to count the amount of carbohydrates:  Measure the foods that you will eat with a food scale or measuring cup, if  needed.  Decide how many standard-size servings you will eat.  Multiply the number of servings by 15. Most carbohydrate-rich foods have about 15 g of carbohydrates per serving. ? For example, if you eat 8 oz (170 g) of strawberries, you will have eaten 2 servings and 30 g of carbohydrates (2 servings x 15 g = 30 g).  For foods that have more than one food mixed, such as soups and casseroles, you must count the carbohydrates in each food that is included. The following list contains standard serving sizes of common carbohydrate-rich foods. Each of these servings has about 15 g of carbohydrates:   hamburger bun or  English muffin.   oz (15 mL) syrup.   oz (14 g) jelly.  1 slice of bread.  1 six-inch tortilla.  3 oz (85 g) cooked rice or pasta.  4 oz (113 g) cooked dried beans.  4 oz (113 g) starchy vegetable, such as peas, corn, or potatoes.  4 oz (113 g) hot cereal.  4 oz (113 g) mashed potatoes or  of a large baked potato.  4 oz (113 g) canned or frozen fruit.  4 oz (120 mL) fruit juice.  4-6 crackers.  6 chicken nuggets.  6 oz (170 g) unsweetened dry cereal.  6 oz (170 g) plain fat-free yogurt or yogurt sweetened with artificial sweeteners.  8 oz (240 mL) milk.  8 oz (170 g) fresh fruit or one small piece of fruit.  24 oz (680 g) popped popcorn. Example of carbohydrate counting Sample meal  3 oz (85 g) chicken breast.  6 oz (170 g)   brown rice.  4 oz (113 g) corn.  8 oz (240 mL) milk.  8 oz (170 g) strawberries with sugar-free whipped topping. Carbohydrate calculation 1. Identify the foods that contain carbohydrates: ? Rice. ? Corn. ? Milk. ? Strawberries. 2. Calculate how many servings you have of each food: ? 2 servings rice. ? 1 serving corn. ? 1 serving milk. ? 1 serving strawberries. 3. Multiply each number of servings by 15 g: ? 2 servings rice x 15 g = 30 g. ? 1 serving corn x 15 g = 15 g. ? 1 serving milk x 15 g = 15 g. ? 1  serving strawberries x 15 g = 15 g. 4. Add together all of the amounts to find the total grams of carbohydrates eaten: ? 30 g + 15 g + 15 g + 15 g = 75 g of carbohydrates total. Summary  Carbohydrate counting is a method of keeping track of how many carbohydrates you eat.  Eating carbohydrates naturally increases the amount of sugar (glucose) in the blood.  Counting how many carbohydrates you eat helps keep your blood glucose within normal limits, which helps you manage your diabetes.  A diet and nutrition specialist (registered dietitian) can help you make a meal plan and calculate how many carbohydrates you should have at each meal and snack. This information is not intended to replace advice given to you by your health care provider. Make sure you discuss any questions you have with your health care provider. Document Revised: 03/31/2017 Document Reviewed: 02/18/2016 Elsevier Patient Education  2020 [employer]  

## 2019-10-31 NOTE — Progress Notes (Signed)
BP 133/76 (BP Location: Left Arm, Patient Position: Sitting, Cuff Size: Normal)    Pulse 85    Temp 97.8 F (36.6 C) (Oral)    LMP 07/13/2000 (Approximate)    SpO2 98%    Subjective:    Patient ID: Charlene Juarez, female    DOB: 03-Jan-1951, 69 y.o.   MRN: [id number]  HPI: Charlene Juarez is a 69 y.o. female  Chief Complaint  Patient presents with   Diabetes   Hyperlipidemia   DIABETES Continues on Metformin and Jardiance with Ozempic discontinued months back, but reports she had to go back on (Ozempic 1 MG weekly) because of Christmas and her numbers were higher.  A1C 5.5% in November.  Continues to focus on diet changes with reduction in carbs.  History of low B12 level with Metformin long term use, is taking daily supplement.  This was improved on recent labs.  Also has history of low Vitamin D and would like checked today.  Had osteopenia noted on 2020 Dexa with T score -2.3. Hypoglycemic episodes:no Polydipsia/polyuria: no Visual disturbance: no Chest pain: no Paresthesias: no Glucose Monitoring: yes  Accucheck frequency: Daily  Fasting glucose: 120-160, had one 200 level (started back on Ozempic with this)  Post prandial:  Evening:  Before meals: Taking Insulin?: no  Long acting insulin:  Short acting insulin: Blood Pressure Monitoring: not checking Retinal Examination: Up To Date Foot Exam: Up to Date Pneumovax: Up to Date Influenza: Up to Date Aspirin: no   HYPERTENSION / HYPERLIPIDEMIA Continues on Micardis, HCTZ, and Simvastatin. Satisfied with current treatment? yes Duration of hypertension: chronic BP monitoring frequency: rarely BP range: 127-130/70's BP medication side effects: no Duration of hyperlipidemia: chronic Cholesterol medication side effects: no Cholesterol supplements: none Medication compliance: good compliance Aspirin: no Recent stressors: no Recurrent headaches: no Visual changes: no Palpitations: no Dyspnea:  no Chest pain: no Lower extremity edema: no Dizzy/lightheaded: no  Relevant past medical, surgical, family and social history reviewed and updated as indicated. Interim medical history since our last visit reviewed. Allergies and medications reviewed and updated.  Review of Systems  Constitutional: Negative for activity change, appetite change, diaphoresis, fatigue and fever.  Respiratory: Negative for cough, chest tightness, shortness of breath and wheezing.   Cardiovascular: Negative for chest pain, palpitations and leg swelling.  Gastrointestinal: Negative.   Endocrine: Negative for cold intolerance, heat intolerance, polydipsia, polyphagia and polyuria.  Neurological: Negative.   Psychiatric/Behavioral: Negative.     Per HPI unless specifically indicated above     Objective:    BP 133/76 (BP Location: Left Arm, Patient Position: Sitting, Cuff Size: Normal)    Pulse 85    Temp 97.8 F (36.6 C) (Oral)    LMP 07/13/2000 (Approximate)    SpO2 98%   Wt Readings from Last 3 Encounters:  10/31/19 162 lb (73.5 kg)  07/25/19 157 lb (71.2 kg)  05/23/19 158 lb 12.8 oz (72 kg)    Physical Exam Vitals and nursing note reviewed.  Constitutional:      General: She is awake. She is not in acute distress.    Appearance: She is well-developed. She is not ill-appearing.  HENT:     Head: Normocephalic.     Right Ear: Hearing normal.     Left Ear: Hearing normal.     Nose: Nose normal.     Mouth/Throat:     Mouth: Mucous membranes are moist.  Eyes:     General: Lids are normal.  Right eye: No discharge.        Left eye: No discharge.     Conjunctiva/sclera: Conjunctivae normal.     Pupils: Pupils are equal, round, and reactive to light.  Neck:     Thyroid: No thyromegaly.     Vascular: No carotid bruit.  Cardiovascular:     Rate and Rhythm: Normal rate and regular rhythm.     Heart sounds: Normal heart sounds. No murmur. No gallop.   Pulmonary:     Effort: Pulmonary effort  is normal. No accessory muscle usage or respiratory distress.     Breath sounds: Normal breath sounds.  Abdominal:     General: Bowel sounds are normal.     Palpations: Abdomen is soft.     Tenderness: There is no abdominal tenderness.  Musculoskeletal:     Cervical back: Normal range of motion and neck supple.     Right lower leg: No edema.     Left lower leg: No edema.  Lymphadenopathy:     Cervical: No cervical adenopathy.  Skin:    General: Skin is warm and dry.  Neurological:     Mental Status: She is alert and oriented to person, place, and time.  Psychiatric:        Attention and Perception: Attention normal.        Mood and Affect: Mood normal.        Speech: Speech normal.        Behavior: Behavior normal. Behavior is cooperative.        Thought Content: Thought content normal.        Judgment: Judgment normal.    Diabetic Foot Exam - Simple   Simple Foot Form Visual Inspection No deformities, no ulcerations, no other skin breakdown bilaterally: Yes Sensation Testing Intact to touch and monofilament testing bilaterally: Yes Pulse Check Posterior Tibialis and Dorsalis pulse intact bilaterally: Yes Comments    Results for orders placed or performed in visit on 10/31/19  Bayer DCA Hb A1c Waived  Result Value Ref Range   HB A1C (BAYER DCA - WAIVED) 6.2 <7.0 %  Microalbumin, Urine Waived  Result Value Ref Range   Microalb, Ur Waived 10 0 - 19 mg/L   Creatinine, Urine Waived 200 10 - 300 mg/dL   Microalb/Creat Ratio <30 <30 mg/g      Assessment & Plan:   Problem List Items Addressed This Visit      Cardiovascular and Mediastinum   Hypertension associated with diabetes ([place])    Chronic, stable with BP at goal.  Continue current medication regimen, Micardis for kidney protection, and adjust as needed.  CMP today.  Recommend she check BP at home on occasion and document for visits.  Return in 3 months.      Relevant Medications   Semaglutide (OZEMPIC, 1  MG/DOSE, [place])   Other Relevant Orders   Bayer DCA Hb A1c Waived (Completed)   Microalbumin, Urine Waived (Completed)   Comprehensive metabolic panel     Endocrine   Type 2 diabetes mellitus without complications (HCC) - Primary    Chronic, stable with A1C slight upward trend from previous as she expected, 6.2% today. Urine ALB 10 and A:C < 30.  Continue current medication regimen, including Jardiance, Metformin, and Ozempic.  To notify provider if sugars consistently read > 130, continue to monitor these at home closely.  Recommend return to diet and exercise focus.  Return in 3 months.      Relevant Medications   Semaglutide (  OZEMPIC, 1 MG/DOSE, [place])   Other Relevant Orders   Bayer DCA Hb A1c Waived (Completed)   Microalbumin, Urine Waived (Completed)   Hyperlipidemia associated with type 2 diabetes mellitus (HCC)    Chronic, ongoing.  Continue current medication regimen and adjust as needed.  Lipid panel today.      Relevant Medications   Semaglutide (OZEMPIC, 1 MG/DOSE, [place])   Other Relevant Orders   Bayer DCA Hb A1c Waived (Completed)   Comprehensive metabolic panel   Lipid Panel w/o Chol/HDL Ratio     Musculoskeletal and Integument   Osteopenia    Noted on Dexa in 2020.  Repeat Dexa 2025.  Continue daily Vit D and calcium.        Other   Obesity    Recommend continued focus on healthy diet choices and regular physical activity (30 minutes 5 days a week).  Return to focus on past regimen.       Relevant Medications   Semaglutide (OZEMPIC, 1 MG/DOSE, [place])   Vitamin D deficiency    Continue current supplement and plan for DEXA scan repeat in 2025 due to osteopenia..  Check Vit d today.      Relevant Orders   VITAMIN D 25 Hydroxy (Vit-D Deficiency, Fractures)   B12 deficiency    Recheck B12 level today.  Restart supplement as needed.      Relevant Orders   Vitamin B12       Follow up plan: Return in about 3 months (around 01/28/2020) for T2DM, HTN/HLD.

## 2019-10-31 NOTE — Assessment & Plan Note (Signed)
Chronic, stable with BP at goal.  Continue current medication regimen, Micardis for kidney protection, and adjust as needed.  CMP today.  Recommend she check BP at home on occasion and document for visits.  Return in 3 months.

## 2019-10-31 NOTE — Patient Instructions (Signed)
Charlene Juarez , Thank you for taking time to come for your Medicare Wellness Visit. I appreciate your ongoing commitment to your health goals. Please review the following plan we discussed and let me know if I can assist you in the future.   Screening recommendations/referrals: Colonoscopy: up to date  Mammogram: up to date  Bone Density: up to date  Recommended yearly ophthalmology/optometry visit for glaucoma screening and checkup Recommended yearly dental visit for hygiene and checkup  Vaccinations: Influenza vaccine: up to date  Pneumococcal vaccine: up to date  Tdap vaccine: up to date  Shingles vaccine: shingrix completed     Advanced directives: Please bring a copy of your health care power of attorney and living will to the office at your convenience.  Conditions/risks identified: diabetic  Next appointment: Follow up in one year for your annual wellness visit   Preventive Care 50 Years and Older, Female Preventive care refers to lifestyle choices and visits with your health care provider that can promote health and wellness. What does preventive care include?  A yearly physical exam. This is also called an annual well check.  Dental exams once or twice a year.  Routine eye exams. Ask your health care provider how often you should have your eyes checked.  Personal lifestyle choices, including:  Daily care of your teeth and gums.  Regular physical activity.  Eating a healthy diet.  Avoiding tobacco and drug use.  Limiting alcohol use.  Practicing safe sex.  Taking low-dose aspirin every day.  Taking vitamin and mineral supplements as recommended by your health care provider. What happens during an annual well check? The services and screenings done by your health care provider during your annual well check will depend on your age, overall health, lifestyle risk factors, and family history of disease. Counseling  Your health care provider may ask you questions  about your:  Alcohol use.  Tobacco use.  Drug use.  Emotional well-being.  Home and relationship well-being.  Sexual activity.  Eating habits.  History of falls.  Memory and ability to understand (cognition).  Work and work Statistician.  Reproductive health. Screening  You may have the following tests or measurements:  Height, weight, and BMI.  Blood pressure.  Lipid and cholesterol levels. These may be checked every 5 years, or more frequently if you are over 62 years old.  Skin check.  Lung cancer screening. You may have this screening every year starting at age 53 if you have a 30-pack-year history of smoking and currently smoke or have quit within the past 15 years.  Fecal occult blood test (FOBT) of the stool. You may have this test every year starting at age 46.  Flexible sigmoidoscopy or colonoscopy. You may have a sigmoidoscopy every 5 years or a colonoscopy every 10 years starting at age 81.  Hepatitis C blood test.  Hepatitis B blood test.  Sexually transmitted disease (STD) testing.  Diabetes screening. This is done by checking your blood sugar (glucose) after you have not eaten for a while (fasting). You may have this done every 1-3 years.  Bone density scan. This is done to screen for osteoporosis. You may have this done starting at age 37.  Mammogram. This may be done every 1-2 years. Talk to your health care provider about how often you should have regular mammograms. Talk with your health care provider about your test results, treatment options, and if necessary, the need for more tests. Vaccines  Your health care provider may  recommend certain vaccines, such as:  Influenza vaccine. This is recommended every year.  Tetanus, diphtheria, and acellular pertussis (Tdap, Td) vaccine. You may need a Td booster every 10 years.  Zoster vaccine. You may need this after age 68.  Pneumococcal 13-valent conjugate (PCV13) vaccine. One dose is  recommended after age 35.  Pneumococcal polysaccharide (PPSV23) vaccine. One dose is recommended after age 14. Talk to your health care provider about which screenings and vaccines you need and how often you need them. This information is not intended to replace advice given to you by your health care provider. Make sure you discuss any questions you have with your health care provider. Document Released: 10/03/2015 Document Revised: 05/26/2016 Document Reviewed: 07/08/2015 Elsevier Interactive Patient Education  2017 [place] Prevention in the Home Falls can cause injuries. They can happen to people of all ages. There are many things you can do to make your home safe and to help prevent falls. What can I do on the outside of my home?  Regularly fix the edges of walkways and driveways and fix any cracks.  Remove anything that might make you trip as you walk through a door, such as a raised step or threshold.  Trim any bushes or trees on the path to your home.  Use bright outdoor lighting.  Clear any walking paths of anything that might make someone trip, such as rocks or tools.  Regularly check to see if handrails are loose or broken. Make sure that both sides of any steps have handrails.  Any raised decks and porches should have guardrails on the edges.  Have any leaves, snow, or ice cleared regularly.  Use sand or salt on walking paths during winter.  Clean up any spills in your garage right away. This includes oil or grease spills. What can I do in the bathroom?  Use night lights.  Install grab bars by the toilet and in the tub and shower. Do not use towel bars as grab bars.  Use non-skid mats or decals in the tub or shower.  If you need to sit down in the shower, use a plastic, non-slip stool.  Keep the floor dry. Clean up any water that spills on the floor as soon as it happens.  Remove soap buildup in the tub or shower regularly.  Attach bath mats  securely with double-sided non-slip rug tape.  Do not have throw rugs and other things on the floor that can make you trip. What can I do in the bedroom?  Use night lights.  Make sure that you have a light by your bed that is easy to reach.  Do not use any sheets or blankets that are too big for your bed. They should not hang down onto the floor.  Have a firm chair that has side arms. You can use this for support while you get dressed.  Do not have throw rugs and other things on the floor that can make you trip. What can I do in the kitchen?  Clean up any spills right away.  Avoid walking on wet floors.  Keep items that you use a lot in easy-to-reach places.  If you need to reach something above you, use a strong step stool that has a grab bar.  Keep electrical cords out of the way.  Do not use floor polish or wax that makes floors slippery. If you must use wax, use non-skid floor wax.  Do not have throw rugs and  other things on the floor that can make you trip. What can I do with my stairs?  Do not leave any items on the stairs.  Make sure that there are handrails on both sides of the stairs and use them. Fix handrails that are broken or loose. Make sure that handrails are as long as the stairways.  Check any carpeting to make sure that it is firmly attached to the stairs. Fix any carpet that is loose or worn.  Avoid having throw rugs at the top or bottom of the stairs. If you do have throw rugs, attach them to the floor with carpet tape.  Make sure that you have a light switch at the top of the stairs and the bottom of the stairs. If you do not have them, ask someone to add them for you. What else can I do to help prevent falls?  Wear shoes that:  Do not have high heels.  Have rubber bottoms.  Are comfortable and fit you well.  Are closed at the toe. Do not wear sandals.  If you use a stepladder:  Make sure that it is fully opened. Do not climb a closed  stepladder.  Make sure that both sides of the stepladder are locked into place.  Ask someone to hold it for you, if possible.  Clearly mark and make sure that you can see:  Any grab bars or handrails.  First and last steps.  Where the edge of each step is.  Use tools that help you move around (mobility aids) if they are needed. These include:  Canes.  Walkers.  Scooters.  Crutches.  Turn on the lights when you go into a dark area. Replace any light bulbs as soon as they burn out.  Set up your furniture so you have a clear path. Avoid moving your furniture around.  If any of your floors are uneven, fix them.  If there are any pets around you, be aware of where they are.  Review your medicines with your doctor. Some medicines can make you feel dizzy. This can increase your chance of falling. Ask your doctor what other things that you can do to help prevent falls. This information is not intended to replace advice given to you by your health care provider. Make sure you discuss any questions you have with your health care provider. Document Released: 07/03/2009 Document Revised: 02/12/2016 Document Reviewed: 10/11/2014 Elsevier Interactive Patient Education  2017 [employer].

## 2019-10-31 NOTE — Assessment & Plan Note (Signed)
Chronic, stable with A1C slight upward trend from previous as she expected, 6.2% today. Urine ALB 10 and A:C < 30.  Continue current medication regimen, including Jardiance, Metformin, and Ozempic.  To notify provider if sugars consistently read > 130, continue to monitor these at home closely.  Recommend return to diet and exercise focus.  Return in 3 months.

## 2019-10-31 NOTE — Assessment & Plan Note (Signed)
Recommend continued focus on healthy diet choices and regular physical activity (30 minutes 5 days a week).  Return to focus on past regimen.

## 2019-10-31 NOTE — Assessment & Plan Note (Signed)
Noted on Dexa in 2020.  Repeat Dexa 2025.  Continue daily Vit D and calcium.

## 2019-11-01 LAB — LIPID PANEL W/O CHOL/HDL RATIO
Cholesterol, Total: 135 mg/dL (ref 100–199)
HDL: 65 mg/dL (ref >39)
LDL Chol Calc (NIH): 47 mg/dL (ref 0–99)
Triglycerides: 136 mg/dL (ref 0–149)
VLDL Cholesterol Cal: 23 mg/dL (ref 5–40)

## 2019-11-01 LAB — COMPREHENSIVE METABOLIC PANEL
ALT: 48 IU/L — ABNORMAL HIGH (ref 0–32)
AST: 29 IU/L (ref 0–40)
Albumin/Globulin Ratio: 2.3 — ABNORMAL HIGH (ref 1.2–2.2)
Albumin: 4.6 g/dL (ref 3.8–4.8)
Alkaline Phosphatase: 59 IU/L (ref 39–117)
BUN/Creatinine Ratio: 21 (ref 12–28)
BUN: 13 mg/dL (ref 8–27)
Bilirubin Total: 0.4 mg/dL (ref 0.0–1.2)
CO2: 22 mmol/L (ref 20–29)
Calcium: 9.8 mg/dL (ref 8.7–10.3)
Chloride: 99 mmol/L (ref 96–106)
Creatinine, Ser: 0.62 mg/dL (ref 0.57–1.00)
GFR calc Af Amer: 107 mL/min/{1.73_m2} (ref >59)
GFR calc non Af Amer: 93 mL/min/{1.73_m2} (ref >59)
Globulin, Total: 2 g/dL (ref 1.5–4.5)
Glucose: 96 mg/dL (ref 65–99)
Potassium: 3.7 mmol/L (ref 3.5–5.2)
Sodium: 140 mmol/L (ref 134–144)
Total Protein: 6.6 g/dL (ref 6.0–8.5)

## 2019-11-01 LAB — VITAMIN B12: Vitamin B-12: >2000 pg/mL — ABNORMAL HIGH (ref 232–1245)

## 2019-11-01 LAB — VITAMIN D 25 HYDROXY (VIT D DEFICIENCY, FRACTURES): Vit D, 25-Hydroxy: 43.5 ng/mL (ref 30.0–100.0)

## 2019-11-01 NOTE — Progress Notes (Signed)
Contacted via MyChart

## 2019-12-01 ENCOUNTER — Other Ambulatory Visit: Payer: Self-pay | Admitting: Nurse Practitioner

## 2019-12-01 NOTE — Telephone Encounter (Signed)
Requested Prescriptions  Pending Prescriptions Disp Refills  . telmisartan (MICARDIS) 80 MG tablet [Pharmacy Med Name: TELMISARTAN 80 MG TABLET] 90 tablet 1    Sig: TAKE 1 TABLET BY MOUTH EVERY DAY     Cardiovascular:  Angiotensin Receptor Blockers Passed - 12/01/2019 10:00 AM      Passed - Cr in normal range and within 180 days    Creatinine, Ser  Date Value Ref Range Status  10/31/2019 0.62 0.57 - 1.00 mg/dL Final         Passed - K in normal range and within 180 days    Potassium  Date Value Ref Range Status  10/31/2019 3.7 3.5 - 5.2 mmol/L Final         Passed - Patient is not pregnant      Passed - Last BP in normal range    BP Readings from Last 1 Encounters:  10/31/19 133/76         Passed - Valid encounter within last 6 months    Recent Outpatient Visits          1 month ago Type 2 diabetes mellitus without complication, without long-term current use of insulin ([place])   [place], Jolene T, NP   4 months ago Type 2 diabetes mellitus without complication, without long-term current use of insulin ([place])   [place], Gardner T, NP   6 months ago Type 2 diabetes mellitus without complication, without long-term current use of insulin ([place])   [place], Jolene T, NP   7 months ago Type 2 diabetes mellitus without complication, without long-term current use of insulin ([place])   [place], Chadron T, NP   10 months ago Essential hypertension   [place], [place], DO      Future Appointments            In 11 months [facility]

## 2020-01-28 ENCOUNTER — Other Ambulatory Visit: Payer: Self-pay | Admitting: Nurse Practitioner

## 2020-01-29 ENCOUNTER — Other Ambulatory Visit: Payer: Self-pay

## 2020-01-29 ENCOUNTER — Ambulatory Visit (INDEPENDENT_AMBULATORY_CARE_PROVIDER_SITE_OTHER): Payer: Medicare Other | Admitting: Nurse Practitioner

## 2020-01-29 ENCOUNTER — Encounter: Payer: Self-pay | Admitting: Nurse Practitioner

## 2020-01-29 VITALS — BP 114/67 | HR 75 | Temp 97.7°F | Ht 62.0 in | Wt 166.4 lb

## 2020-01-29 DIAGNOSIS — T148XXA Other injury of unspecified body region, initial encounter: Secondary | ICD-10-CM | POA: Diagnosis not present

## 2020-01-29 DIAGNOSIS — E119 Type 2 diabetes mellitus without complications: Secondary | ICD-10-CM | POA: Diagnosis not present

## 2020-01-29 DIAGNOSIS — I10 Essential (primary) hypertension: Secondary | ICD-10-CM

## 2020-01-29 DIAGNOSIS — I152 Hypertension secondary to endocrine disorders: Secondary | ICD-10-CM

## 2020-01-29 DIAGNOSIS — Z23 Encounter for immunization: Secondary | ICD-10-CM | POA: Diagnosis not present

## 2020-01-29 DIAGNOSIS — E785 Hyperlipidemia, unspecified: Secondary | ICD-10-CM

## 2020-01-29 DIAGNOSIS — E1169 Type 2 diabetes mellitus with other specified complication: Secondary | ICD-10-CM

## 2020-01-29 DIAGNOSIS — E1159 Type 2 diabetes mellitus with other circulatory complications: Secondary | ICD-10-CM

## 2020-01-29 LAB — BAYER DCA HB A1C WAIVED: HB A1C (BAYER DCA - WAIVED): 6.1 % (ref <7.0)

## 2020-01-29 NOTE — Progress Notes (Signed)
BP 114/67   Pulse 75   Temp 97.7 F (36.5 C) (Oral)   Ht 5\' 2"  (1.575 m)   Wt 166 lb 6.4 oz (75.5 kg)   LMP 07/13/2000 (Approximate)   SpO2 96%   BMI 30.43 kg/m    Subjective:    Patient ID: Charlene Juarez, female    DOB: 09/20/1951, 69 y.o.   MRN: [id number]  HPI: Charlene Juarez is a 69 y.o. female  Chief Complaint  Patient presents with  . Diabetes  . Hyperlipidemia  . Hypertension   DIABETES Continues on Metformin, Jardiance, and Ozempic. Recent A1C was 6.2% last visit.   Hypoglycemic episodes:no Polydipsia/polyuria: no Visual disturbance: no Chest pain: no Paresthesias: no Glucose Monitoring: yes  Accucheck frequency: Daily  Fasting glucose: 140's  Post prandial:  Evening:  Before meals: Taking Insulin?: no  Long acting insulin:  Short acting insulin: Blood Pressure Monitoring: not checking Retinal Examination: Up To Date Foot Exam: Up to Date Pneumovax: Up to Date Influenza: Up to Date Aspirin: no   HYPERTENSION / HYPERLIPIDEMIA Continues on Micardis, HCTZ, and Simvastatin. Satisfied with current treatment? yes Duration of hypertension: chronic BP monitoring frequency: rarely BP range: 120-130/70's BP medication side effects: no Duration of hyperlipidemia: chronic Cholesterol medication side effects: no Cholesterol supplements: none Medication compliance: good compliance Aspirin: no Recent stressors: no Recurrent headaches: no Visual changes: no Palpitations: no Dyspnea: no Chest pain: no Lower extremity edema: no Dizzy/lightheaded: no  The 10-year ASCVD risk score Mikey Bussing DC Jr., et al., 2013) is: 12.6%   Values used to calculate the score:     Age: 48 years     Sex: Female     Is Non-Hispanic African American: No     Diabetic: Yes     Tobacco smoker: No     Systolic Blood Pressure: 99991111 mmHg     Is BP treated: Yes     HDL Cholesterol: 65 mg/dL     Total Cholesterol: 135 mg/dL   SKIN ABRASION: She was helping son  with gutters and cut left finger while helping.  Area is healing, no s/s infection.  Is not up to date on Tetanus and would like this today due to concerns with cut against metal.  Relevant past medical, surgical, family and social history reviewed and updated as indicated. Interim medical history since our last visit reviewed. Allergies and medications reviewed and updated.  Review of Systems  Constitutional: Negative for activity change, appetite change, diaphoresis, fatigue and fever.  Respiratory: Negative for cough, chest tightness, shortness of breath and wheezing.   Cardiovascular: Negative for chest pain, palpitations and leg swelling.  Gastrointestinal: Negative.   Endocrine: Negative for cold intolerance, heat intolerance, polydipsia, polyphagia and polyuria.  Neurological: Negative.   Psychiatric/Behavioral: Negative.     Per HPI unless specifically indicated above     Objective:    BP 114/67   Pulse 75   Temp 97.7 F (36.5 C) (Oral)   Ht 5\' 2"  (1.575 m)   Wt 166 lb 6.4 oz (75.5 kg)   LMP 07/13/2000 (Approximate)   SpO2 96%   BMI 30.43 kg/m   Wt Readings from Last 3 Encounters:  01/29/20 166 lb 6.4 oz (75.5 kg)  10/31/19 162 lb (73.5 kg)  07/25/19 157 lb (71.2 kg)    Physical Exam Vitals and nursing note reviewed.  Constitutional:      General: She is awake. She is not in acute distress.    Appearance: She is well-developed. She  is not ill-appearing.  HENT:     Head: Normocephalic.     Right Ear: Hearing normal.     Left Ear: Hearing normal.     Nose: Nose normal.     Mouth/Throat:     Mouth: Mucous membranes are moist.  Eyes:     General: Lids are normal.        Right eye: No discharge.        Left eye: No discharge.     Conjunctiva/sclera: Conjunctivae normal.     Pupils: Pupils are equal, round, and reactive to light.  Neck:     Thyroid: No thyromegaly.     Vascular: No carotid bruit.  Cardiovascular:     Rate and Rhythm: Normal rate and regular  rhythm.     Heart sounds: Normal heart sounds. No murmur. No gallop.   Pulmonary:     Effort: Pulmonary effort is normal. No accessory muscle usage or respiratory distress.     Breath sounds: Normal breath sounds.  Abdominal:     General: Bowel sounds are normal.     Palpations: Abdomen is soft.     Tenderness: There is no abdominal tenderness.  Musculoskeletal:     Cervical back: Normal range of motion and neck supple.     Right lower leg: No edema.     Left lower leg: No edema.  Lymphadenopathy:     Cervical: No cervical adenopathy.  Skin:    General: Skin is warm and dry.     Comments: Small 1 cm abrasion left middle finger with crusting.  No erythema, edema, or swelling.  Neurological:     Mental Status: She is alert and oriented to person, place, and time.  Psychiatric:        Attention and Perception: Attention normal.        Mood and Affect: Mood normal.        Speech: Speech normal.        Behavior: Behavior normal. Behavior is cooperative.        Thought Content: Thought content normal.        Judgment: Judgment normal.     Results for orders placed or performed in visit on 10/31/19  Bayer DCA Hb A1c Waived  Result Value Ref Range   HB A1C (BAYER DCA - WAIVED) 6.2 <7.0 %  Microalbumin, Urine Waived  Result Value Ref Range   Microalb, Ur Waived 10 0 - 19 mg/L   Creatinine, Urine Waived 200 10 - 300 mg/dL   Microalb/Creat Ratio <30 <30 mg/g  Comprehensive metabolic panel  Result Value Ref Range   Glucose 96 65 - 99 mg/dL   BUN 13 8 - 27 mg/dL   Creatinine, Ser 0.62 0.57 - 1.00 mg/dL   GFR calc non Af Amer 93 >59 mL/min/1.73   GFR calc Af Amer 107 >59 mL/min/1.73   BUN/Creatinine Ratio 21 12 - 28   Sodium 140 134 - 144 mmol/L   Potassium 3.7 3.5 - 5.2 mmol/L   Chloride 99 96 - 106 mmol/L   CO2 22 20 - 29 mmol/L   Calcium 9.8 8.7 - 10.3 mg/dL   Total Protein 6.6 6.0 - 8.5 g/dL   Albumin 4.6 3.8 - 4.8 g/dL   Globulin, Total 2.0 1.5 - 4.5 g/dL    Albumin/Globulin Ratio 2.3 (H) 1.2 - 2.2   Bilirubin Total 0.4 0.0 - 1.2 mg/dL   Alkaline Phosphatase 59 39 - 117 IU/L   AST 29 0 - 40 IU/L  ALT 48 (H) 0 - 32 IU/L  Lipid Panel w/o Chol/HDL Ratio  Result Value Ref Range   Cholesterol, Total 135 100 - 199 mg/dL   Triglycerides 136 0 - 149 mg/dL   HDL 65 >39 mg/dL   VLDL Cholesterol Cal 23 5 - 40 mg/dL   LDL Chol Calc (NIH) 47 0 - 99 mg/dL  VITAMIN D 25 Hydroxy (Vit-D Deficiency, Fractures)  Result Value Ref Range   Vit D, 25-Hydroxy 43.5 30.0 - 100.0 ng/mL  Vitamin B12  Result Value Ref Range   Vitamin B-12 >2000 (H) 232 - 1245 pg/mL      Assessment & Plan:   Problem List Items Addressed This Visit      Cardiovascular and Mediastinum   Hypertension associated with diabetes ([place])    Chronic, stable with BP at goal in office and at home.  Continue current medication regimen, Micardis for kidney protection, and adjust as needed.  BMP next visit.  Recommend she continue to check BP at home on occasion and document for visits.  Return in 3 months.        Endocrine   Type 2 diabetes mellitus without complications (HCC) - Primary    Chronic, stable with A1C 6.1% today. Urine ALB 10 and A:C < 30 at recent visit.  Continue current medication regimen, including Jardiance, Metformin, and Ozempic.  To notify provider if sugars consistently read > 130, continue to monitor these at home closely.  Recommend return to diet and exercise focus.  Return in 3 months.      Relevant Orders   Bayer DCA Hb A1c Waived   Hyperlipidemia associated with type 2 diabetes mellitus (HCC)    Chronic, ongoing.  Continue current medication regimen and adjust as needed.  Lipid panel next visit.       Other Visit Diagnoses    Skin abrasion       To left finger, healing.  Tetanus today.  Continue care at home and return to office if any s/s infection.       Follow up plan: Return in about 3 months (around 04/30/2020) for T2DM, HTN/HLD, GERD, VitB12 and  D.

## 2020-01-29 NOTE — Patient Instructions (Signed)

## 2020-01-29 NOTE — Assessment & Plan Note (Signed)
Chronic, ongoing.  Continue current medication regimen and adjust as needed.  Lipid panel next visit.    

## 2020-01-29 NOTE — Assessment & Plan Note (Signed)
Chronic, stable with BP at goal in office and at home.  Continue current medication regimen, Micardis for kidney protection, and adjust as needed.  BMP next visit.  Recommend she continue to check BP at home on occasion and document for visits.  Return in 3 months.

## 2020-01-29 NOTE — Assessment & Plan Note (Addendum)
Chronic, stable with A1C 6.1% today. Urine ALB 10 and A:C < 30 at recent visit.  Continue current medication regimen, including Jardiance, Metformin, and Ozempic.  To notify provider if sugars consistently read > 130, continue to monitor these at home closely.  Recommend return to diet and exercise focus.  Return in 3 months.

## 2020-02-26 ENCOUNTER — Other Ambulatory Visit: Payer: Self-pay | Admitting: Nurse Practitioner

## 2020-02-26 NOTE — Telephone Encounter (Signed)
Requested Prescriptions  Pending Prescriptions Disp Refills   metFORMIN (GLUCOPHAGE) 1000 MG tablet [Pharmacy Med Name: METFORMIN HCL 1,000 MG TABLET] 180 tablet 1    Sig: TAKE 1 TABLET (1,000 MG TOTAL) BY MOUTH 2 (TWO) TIMES DAILY WITH A MEAL.     Endocrinology:  Diabetes - Biguanides Passed - 02/26/2020  9:41 AM      Passed - Cr in normal range and within 360 days    Creatinine, Ser  Date Value Ref Range Status  10/31/2019 0.62 0.57 - 1.00 mg/dL Final         Passed - HBA1C is between 0 and 7.9 and within 180 days    HB A1C (BAYER DCA - WAIVED)  Date Value Ref Range Status  01/29/2020 6.1 <7.0 % Final    Comment:                                          Diabetic Adult            <7.0                                       Healthy Adult        4.3 - 5.7                                                           (DCCT/NGSP) American Diabetes Association's Summary of Glycemic Recommendations for Adults with Diabetes: Hemoglobin A1c <7.0%. More stringent glycemic goals (A1c <6.0%) may further reduce complications at the cost of increased risk of hypoglycemia.          Passed - eGFR in normal range and within 360 days    GFR calc Af Amer  Date Value Ref Range Status  10/31/2019 107 >59 mL/min/1.73 Final   GFR calc non Af Amer  Date Value Ref Range Status  10/31/2019 93 >59 mL/min/1.73 Final         Passed - Valid encounter within last 6 months    Recent Outpatient Visits          4 weeks ago Type 2 diabetes mellitus without complication, without long-term current use of insulin ([place])   [place], [place] T, NP   3 months ago Type 2 diabetes mellitus without complication, without long-term current use of insulin ([place])   [place], Jolene T, NP   7 months ago Type 2 diabetes mellitus without complication, without long-term current use of insulin ([place])   [place], Jolene T, NP   9 months ago Type 2 diabetes  mellitus without complication, without long-term current use of insulin ([place])   [place] Cannady, Jolene T, NP   10 months ago Type 2 diabetes mellitus without complication, without long-term current use of insulin ([place])   [place], Barbaraann Faster, NP      Future Appointments            In 2 months Cannady, Barbaraann Faster, NP [facility]   In 8 months  [facility]

## 2020-02-27 ENCOUNTER — Other Ambulatory Visit: Payer: Self-pay | Admitting: Nurse Practitioner

## 2020-02-27 NOTE — Telephone Encounter (Signed)
Requested Prescriptions  Pending Prescriptions Disp Refills  . simvastatin (ZOCOR) 20 MG tablet [Pharmacy Med Name: SIMVASTATIN 20 MG TABLET] 90 tablet 1    Sig: TAKE 1 TABLET BY MOUTH EVERY DAY     Cardiovascular:  Antilipid - Statins Failed - 02/27/2020  1:22 AM      Failed - LDL in normal range and within 360 days    LDL Chol Calc (NIH)  Date Value Ref Range Status  10/31/2019 47 0 - 99 mg/dL Final         Passed - Total Cholesterol in normal range and within 360 days    Cholesterol, Total  Date Value Ref Range Status  10/31/2019 135 100 - 199 mg/dL Final         Passed - HDL in normal range and within 360 days    HDL  Date Value Ref Range Status  10/31/2019 65 >39 mg/dL Final         Passed - Triglycerides in normal range and within 360 days    Triglycerides  Date Value Ref Range Status  10/31/2019 136 0 - 149 mg/dL Final         Passed - Patient is not pregnant      Passed - Valid encounter within last 12 months    Recent Outpatient Visits          4 weeks ago Type 2 diabetes mellitus without complication, without long-term current use of insulin ([place])   [place], [place] T, NP   3 months ago Type 2 diabetes mellitus without complication, without long-term current use of insulin ([place])   [place], Jolene T, NP   7 months ago Type 2 diabetes mellitus without complication, without long-term current use of insulin ([place])   [place], Jolene T, NP   9 months ago Type 2 diabetes mellitus without complication, without long-term current use of insulin ([place])   [place], Jolene T, NP   10 months ago Type 2 diabetes mellitus without complication, without long-term current use of insulin ([place])   [place], Barbaraann Faster, NP      Future Appointments            In 2 months Cannady, Barbaraann Faster, NP [facility]   In 8 months  [facility]            . SYMBICORT 160-4.5 MCG/ACT inhaler [Pharmacy Med Name: SYMBICORT 160-4.5 MCG INHALER] 30.6 Inhaler 1    Sig: TAKE 2 PUFFS BY MOUTH TWICE A DAY     Pulmonology:  Combination Products Passed - 02/27/2020  1:22 AM      Passed - Valid encounter within last 12 months    Recent Outpatient Visits          4 weeks ago Type 2 diabetes mellitus without complication, without long-term current use of insulin ([place])   [place], [place] T, NP   3 months ago Type 2 diabetes mellitus without complication, without long-term current use of insulin ([place])   [place], Jolene T, NP   7 months ago Type 2 diabetes mellitus without complication, without long-term current use of insulin ([place])   [place], Jolene T, NP   9 months ago Type 2 diabetes mellitus without complication, without long-term current use of insulin ([place])   [place], [place] T, NP   10 months ago Type  2 diabetes mellitus without complication, without long-term current use of insulin ([place])   [place], Barbaraann Faster, NP      Future Appointments            In 2 months Cannady, Barbaraann Faster, NP [facility]   In 8 months  [facility]

## 2020-03-11 ENCOUNTER — Encounter: Payer: Self-pay | Admitting: Nurse Practitioner

## 2020-03-22 ENCOUNTER — Other Ambulatory Visit: Payer: Self-pay | Admitting: Nurse Practitioner

## 2020-03-22 NOTE — Telephone Encounter (Signed)
Requested medication (s) are due for refill today: yes  Requested medication (s) are on the active medication list: listed as a "historical med."  Last refill:  07/10/19  Future visit scheduled: yes  Notes to clinic:  med was discontinued 07/25/19 "patient preference"   Requested Prescriptions  Pending Prescriptions Disp Refills   OZEMPIC, 1 MG/DOSE, 2 MG/1.5ML SOPN [Pharmacy Med Name: OZEMPIC 1 MG DOSE PEN (1.5 ML)]  2    Sig: INJECT 1 MG INTO THE SKIN ONCE A WEEK.      Endocrinology:  Diabetes - GLP-1 Receptor Agonists Passed - 03/22/2020  9:41 AM      Passed - HBA1C is between 0 and 7.9 and within 180 days    HB A1C (BAYER DCA - WAIVED)  Date Value Ref Range Status  01/29/2020 6.1 <7.0 % Final    Comment:                                          Diabetic Adult            <7.0                                       Healthy Adult        4.3 - 5.7                                                           (DCCT/NGSP) American Diabetes Association's Summary of Glycemic Recommendations for Adults with Diabetes: Hemoglobin A1c <7.0%. More stringent glycemic goals (A1c <6.0%) may further reduce complications at the cost of increased risk of hypoglycemia.           Passed - Valid encounter within last 6 months    Recent Outpatient Visits           1 month ago Type 2 diabetes mellitus without complication, without long-term current use of insulin ([place])   [place], [place] T, NP   4 months ago Type 2 diabetes mellitus without complication, without long-term current use of insulin ([place])   [place], Jolene T, NP   8 months ago Type 2 diabetes mellitus without complication, without long-term current use of insulin ([place])   [place], Jolene T, NP   10 months ago Type 2 diabetes mellitus without complication, without long-term current use of insulin ([place])   [place] Cannady, Jolene T, NP   11 months ago  Type 2 diabetes mellitus without complication, without long-term current use of insulin ([place])   [place], Barbaraann Faster, NP       Future Appointments             In 1 month Cannady, Barbaraann Faster, NP [facility]   In 7 months  [facility]

## 2020-03-25 ENCOUNTER — Other Ambulatory Visit: Payer: Self-pay | Admitting: Nurse Practitioner

## 2020-03-25 MED ORDER — OZEMPIC (1 MG/DOSE) 2 MG/1.5ML ~~LOC~~ SOPN
1.0000 mg | PEN_INJECTOR | SUBCUTANEOUS | 8 refills | Status: DC
Start: 2020-03-25 — End: 2021-02-12

## 2020-04-01 ENCOUNTER — Telehealth: Payer: Self-pay | Admitting: Nurse Practitioner

## 2020-04-01 NOTE — Chronic Care Management (AMB) (Signed)
  Chronic Care Management   Outreach Note  04/01/2020 Name: Charlene Juarez MRN: [id number] DOB: 01/22/1951  Charlene Juarez is a 68 y.o. year old female who is a primary care patient of Cannady, Barbaraann Faster, NP. I reached out to Charlene Juarez by phone today in response to a referral sent by [name]'s health plan.     An unsuccessful telephone outreach was attempted today. The patient was referred to the case management team for assistance with care management and care coordination.   Follow Up Plan: A HIPPA compliant phone message was left for the patient providing contact information and requesting a return call.  The care management team will reach out to the patient again over the next 7 days.  If patient returns call to provider office, please advise to call [place]  at [place], [place], [place], [place] [address] Direct Dial: [phone] [email or web address] Website: [email or web address]

## 2020-04-07 NOTE — Chronic Care Management (AMB) (Signed)
  Chronic Care Management   Outreach Note  04/07/2020 Name: Donyetta Ogletree MRN: [id number] DOB: 1951/08/24  Brylee Berk is a 69 y.o. year old female who is a primary care patient of Cannady, Barbaraann Faster, NP. I reached out to [name] by phone today in response to a referral sent by Ms. [name] Dorning's health plan.     A second unsuccessful telephone outreach was attempted today. The patient was referred to the case management team for assistance with care management and care coordination.   Follow Up Plan: A HIPPA compliant phone message was left for the patient providing contact information and requesting a return call.  The care management team will reach out to the patient again over the next 7 days.  If patient returns call to provider office, please advise to call [place] at [place], [place], [place], [place] [address] Direct Dial: [phone] [email or web address]: [email or web address]

## 2020-04-16 NOTE — Chronic Care Management (AMB) (Signed)
  Chronic Care Management   Note  04/16/2020 Name: Charlene Juarez MRN: [id number] DOB: 03/27/1951  Charlene Juarez is a 69 y.o. year old female who is a primary care patient of Cannady, Barbaraann Faster, NP. I reached out to [name] by phone today in response to a referral sent by [name]'s health plan.     [name] was given information about Chronic Care Management services today including:  1. CCM service includes personalized support from designated clinical staff supervised by her physician, including individualized plan of care and coordination with other care providers 2. 24/7 contact phone numbers for assistance for urgent and routine care needs. 3. Service will only be billed when office clinical staff spend 20 minutes or more in a month to coordinate care. 4. Only one practitioner may furnish and bill the service in a calendar month. 5. The patient may stop CCM services at any time (effective at the end of the month) by phone call to the office staff. 6. The patient will be responsible for cost sharing (co-pay) of up to 20% of the service fee (after annual deductible is met).  Patient did not agree to enrollment in care management services and does not wish to consider at this time.  Follow up plan: The patient has been provided with contact information for the care management team and has been advised to call with any health related questions or concerns.   Noreene Larsson, [place], [place], [place] [address] Direct Dial: [phone] [email or web address]: [email or web address]

## 2020-04-30 ENCOUNTER — Ambulatory Visit: Payer: Medicare Other | Admitting: Nurse Practitioner

## 2020-05-01 ENCOUNTER — Ambulatory Visit: Payer: Medicare Other | Admitting: Nurse Practitioner

## 2020-05-02 ENCOUNTER — Encounter: Payer: Self-pay | Admitting: Nurse Practitioner

## 2020-05-08 ENCOUNTER — Ambulatory Visit (INDEPENDENT_AMBULATORY_CARE_PROVIDER_SITE_OTHER): Payer: Medicare Other | Admitting: Nurse Practitioner

## 2020-05-08 ENCOUNTER — Encounter: Payer: Self-pay | Admitting: Nurse Practitioner

## 2020-05-08 ENCOUNTER — Other Ambulatory Visit: Payer: Self-pay

## 2020-05-08 VITALS — BP 124/77 | HR 79 | Temp 97.8°F | Wt 170.8 lb

## 2020-05-08 DIAGNOSIS — E1169 Type 2 diabetes mellitus with other specified complication: Secondary | ICD-10-CM | POA: Diagnosis not present

## 2020-05-08 DIAGNOSIS — E1159 Type 2 diabetes mellitus with other circulatory complications: Secondary | ICD-10-CM | POA: Diagnosis not present

## 2020-05-08 DIAGNOSIS — Z719 Counseling, unspecified: Secondary | ICD-10-CM

## 2020-05-08 DIAGNOSIS — I10 Essential (primary) hypertension: Secondary | ICD-10-CM | POA: Diagnosis not present

## 2020-05-08 DIAGNOSIS — E669 Obesity, unspecified: Secondary | ICD-10-CM | POA: Diagnosis not present

## 2020-05-08 DIAGNOSIS — E785 Hyperlipidemia, unspecified: Secondary | ICD-10-CM | POA: Diagnosis not present

## 2020-05-08 LAB — BAYER DCA HB A1C WAIVED: HB A1C (BAYER DCA - WAIVED): 6.6 % (ref <7.0)

## 2020-05-08 NOTE — Assessment & Plan Note (Signed)
Chronic, ongoing.  Continue current medication regimen and adjust as needed. Lipid panel today. 

## 2020-05-08 NOTE — Patient Instructions (Signed)

## 2020-05-08 NOTE — Progress Notes (Signed)
BP 124/77   Pulse 79   Temp 97.8 F (36.6 C) (Oral)   Wt 170 lb 12.8 oz (77.5 kg)   LMP 07/13/2000 (Approximate)   SpO2 98%   BMI 31.24 kg/m    Subjective:    Patient ID: Charlene Juarez, female    DOB: 07/05/1951, 69 y.o.   MRN: [id number]  HPI: Charlene Juarez is a 69 y.o. female  Chief Complaint  Patient presents with  . Diabetes  . Hyperlipidemia  . Hypertension   DIABETES Continues on Metformin, Jardiance, and Ozempic. Recent A1C was 6.1% last visit.   Hypoglycemic episodes:no Polydipsia/polyuria: no Visual disturbance: no Chest pain: no Paresthesias: no Glucose Monitoring: yes  Accucheck frequency: Daily  Fasting glucose: 120-140 range  Post prandial:  Evening:  Before meals: Taking Insulin?: no  Long acting insulin:  Short acting insulin: Blood Pressure Monitoring: not checking Retinal Examination: Not Up To Date Foot Exam: Up to Date Pneumovax: Up to Date Influenza: Up to Date Aspirin: no   HYPERTENSION / HYPERLIPIDEMIA Continues on Micardis, HCTZ, and Simvastatin. Satisfied with current treatment? yes Duration of hypertension: chronic BP monitoring frequency: rarely BP range: 120-130/70's BP medication side effects: no Duration of hyperlipidemia: chronic Cholesterol medication side effects: no Cholesterol supplements: none Medication compliance: good compliance Aspirin: no Recent stressors: no Recurrent headaches: no Visual changes: no Palpitations: no Dyspnea: no Chest pain: no Lower extremity edema: no Dizzy/lightheaded: no  The 10-year ASCVD risk score Mikey Bussing DC Jr., et al., 2013) is: 14.7%   Values used to calculate the score:     Age: 39 years     Sex: Female     Is Non-Hispanic African American: No     Diabetic: Yes     Tobacco smoker: No     Systolic Blood Pressure: 924 mmHg     Is BP treated: Yes     HDL Cholesterol: 65 mg/dL     Total Cholesterol: 135 mg/dL   Relevant past medical, surgical, family and  social history reviewed and updated as indicated. Interim medical history since our last visit reviewed. Allergies and medications reviewed and updated.  Review of Systems  Constitutional: Negative for activity change, appetite change, diaphoresis, fatigue and fever.  Respiratory: Negative for cough, chest tightness, shortness of breath and wheezing.   Cardiovascular: Negative for chest pain, palpitations and leg swelling.  Gastrointestinal: Negative.   Endocrine: Negative for cold intolerance, heat intolerance, polydipsia, polyphagia and polyuria.  Neurological: Negative.   Psychiatric/Behavioral: Negative.     Per HPI unless specifically indicated above     Objective:    BP 124/77   Pulse 79   Temp 97.8 F (36.6 C) (Oral)   Wt 170 lb 12.8 oz (77.5 kg)   LMP 07/13/2000 (Approximate)   SpO2 98%   BMI 31.24 kg/m   Wt Readings from Last 3 Encounters:  05/08/20 170 lb 12.8 oz (77.5 kg)  01/29/20 166 lb 6.4 oz (75.5 kg)  10/31/19 162 lb (73.5 kg)    Physical Exam Vitals and nursing note reviewed.  Constitutional:      General: She is awake. She is not in acute distress.    Appearance: She is well-developed. She is not ill-appearing.  HENT:     Head: Normocephalic.     Right Ear: Hearing normal.     Left Ear: Hearing normal.     Nose: Nose normal.     Mouth/Throat:     Mouth: Mucous membranes are moist.  Eyes:  General: Lids are normal.        Right eye: No discharge.        Left eye: No discharge.     Conjunctiva/sclera: Conjunctivae normal.     Pupils: Pupils are equal, round, and reactive to light.  Neck:     Thyroid: No thyromegaly.     Vascular: No carotid bruit.  Cardiovascular:     Rate and Rhythm: Normal rate and regular rhythm.     Heart sounds: Normal heart sounds. No murmur heard.  No gallop.   Pulmonary:     Effort: Pulmonary effort is normal. No accessory muscle usage or respiratory distress.     Breath sounds: Normal breath sounds.  Abdominal:       General: Bowel sounds are normal.     Palpations: Abdomen is soft.     Tenderness: There is no abdominal tenderness.  Musculoskeletal:     Cervical back: Normal range of motion and neck supple.     Right lower leg: No edema.     Left lower leg: No edema.  Lymphadenopathy:     Cervical: No cervical adenopathy.  Skin:    General: Skin is warm and dry.  Neurological:     Mental Status: She is alert and oriented to person, place, and time.  Psychiatric:        Attention and Perception: Attention normal.        Mood and Affect: Mood normal.        Speech: Speech normal.        Behavior: Behavior normal. Behavior is cooperative.        Thought Content: Thought content normal.        Judgment: Judgment normal.     Results for orders placed or performed in visit on 01/29/20  Bayer DCA Hb A1c Waived  Result Value Ref Range   HB A1C (BAYER DCA - WAIVED) 6.1 <7.0 %      Assessment & Plan:   Problem List Items Addressed This Visit      Cardiovascular and Mediastinum   Hypertension associated with diabetes ([place])    Chronic, stable with BP at goal in office and at home.  Continue current medication regimen, Micardis for kidney protection, and adjust as needed.  BMP today.  Recommend she continue to check BP at home on occasion and document for visits.  Focus on DASH diet.  Return in 3 months.      Relevant Orders   Bayer DCA Hb A1c Waived   Basic metabolic panel     Endocrine   Type 2 diabetes mellitus with obesity (HCC) - Primary    Chronic, stable with A1C 6.6% today, slight trend upwards -- she will monitor diet as has been making preserves and eating a little of this. Urine ALB 10 and A:C < 30 at recent visit.  Continue current medication regimen, including Jardiance, Metformin, and Ozempic.  To notify provider if sugars consistently read > 130, continue to monitor these at home closely.  Recommend return to diet and exercise focus.  Return in 3 months.      Relevant Orders    Bayer DCA Hb A1c Waived   Hyperlipidemia associated with type 2 diabetes mellitus (HCC)    Chronic, ongoing.  Continue current medication regimen and adjust as needed.  Lipid panel today.      Relevant Orders   Bayer DCA Hb A1c Waived   Lipid Panel w/o Chol/HDL Ratio    Other Visit Diagnoses  Health education       Educated on BEERS criteria and to stop taking Benadryl -- instead try Melatonin.  Provided print out on medications to avoid over the age of 36.       Follow up plan: Return in about 3 months (around 08/08/2020) for T2DM, HTN/HLD.

## 2020-05-08 NOTE — Assessment & Plan Note (Signed)
Chronic, stable with BP at goal in office and at home.  Continue current medication regimen, Micardis for kidney protection, and adjust as needed.  BMP today.  Recommend she continue to check BP at home on occasion and document for visits.  Focus on DASH diet.  Return in 3 months.

## 2020-05-08 NOTE — Assessment & Plan Note (Signed)
Chronic, stable with A1C 6.6% today, slight trend upwards -- she will monitor diet as has been making preserves and eating a little of this. Urine ALB 10 and A:C < 30 at recent visit.  Continue current medication regimen, including Jardiance, Metformin, and Ozempic.  To notify provider if sugars consistently read > 130, continue to monitor these at home closely.  Recommend return to diet and exercise focus.  Return in 3 months.

## 2020-05-09 LAB — BASIC METABOLIC PANEL
BUN/Creatinine Ratio: 25 (ref 12–28)
BUN: 19 mg/dL (ref 8–27)
CO2: 25 mmol/L (ref 20–29)
Calcium: 9.5 mg/dL (ref 8.7–10.3)
Chloride: 99 mmol/L (ref 96–106)
Creatinine, Ser: 0.75 mg/dL (ref 0.57–1.00)
GFR calc Af Amer: 95 mL/min/{1.73_m2} (ref >59)
GFR calc non Af Amer: 82 mL/min/{1.73_m2} (ref >59)
Glucose: 112 mg/dL — ABNORMAL HIGH (ref 65–99)
Potassium: 4.2 mmol/L (ref 3.5–5.2)
Sodium: 138 mmol/L (ref 134–144)

## 2020-05-09 LAB — LIPID PANEL W/O CHOL/HDL RATIO
Cholesterol, Total: 166 mg/dL (ref 100–199)
HDL: 43 mg/dL (ref >39)
LDL Chol Calc (NIH): 60 mg/dL (ref 0–99)
Triglycerides: 414 mg/dL — ABNORMAL HIGH (ref 0–149)
VLDL Cholesterol Cal: 63 mg/dL — ABNORMAL HIGH (ref 5–40)

## 2020-05-09 NOTE — Progress Notes (Signed)
Contacted via [place] morning Shakita, your labs have returned.  Everything continues to look good with exception of elevation in triglycerides these labs.  Did you eat before visit?  If continue elevation next visit we may consider adding on medication that specifically focuses on lowering these.  If any questions let me know.  Have a great day!! Keep being awesome!!  Thank you for allowing me to participate in your care. Kindest regards, Delisa Finck

## 2020-05-20 DIAGNOSIS — D2262 Melanocytic nevi of left upper limb, including shoulder: Secondary | ICD-10-CM | POA: Diagnosis not present

## 2020-05-20 DIAGNOSIS — D2272 Melanocytic nevi of left lower limb, including hip: Secondary | ICD-10-CM | POA: Diagnosis not present

## 2020-05-20 DIAGNOSIS — D485 Neoplasm of uncertain behavior of skin: Secondary | ICD-10-CM | POA: Diagnosis not present

## 2020-05-20 DIAGNOSIS — L718 Other rosacea: Secondary | ICD-10-CM | POA: Diagnosis not present

## 2020-05-20 DIAGNOSIS — D2271 Melanocytic nevi of right lower limb, including hip: Secondary | ICD-10-CM | POA: Diagnosis not present

## 2020-05-20 DIAGNOSIS — D225 Melanocytic nevi of trunk: Secondary | ICD-10-CM | POA: Diagnosis not present

## 2020-05-20 DIAGNOSIS — L570 Actinic keratosis: Secondary | ICD-10-CM | POA: Diagnosis not present

## 2020-05-20 DIAGNOSIS — D2261 Melanocytic nevi of right upper limb, including shoulder: Secondary | ICD-10-CM | POA: Diagnosis not present

## 2020-05-25 ENCOUNTER — Other Ambulatory Visit: Payer: Self-pay | Admitting: Nurse Practitioner

## 2020-05-25 NOTE — Telephone Encounter (Signed)
Requested Prescriptions  Pending Prescriptions Disp Refills   telmisartan (MICARDIS) 80 MG tablet [Pharmacy Med Name: TELMISARTAN 80 MG TABLET] 90 tablet 1    Sig: TAKE 1 TABLET BY MOUTH EVERY DAY     Cardiovascular:  Angiotensin Receptor Blockers Passed - 05/25/2020  9:14 AM      Passed - Cr in normal range and within 180 days    Creatinine, Ser  Date Value Ref Range Status  05/08/2020 0.75 0.57 - 1.00 mg/dL Final         Passed - K in normal range and within 180 days    Potassium  Date Value Ref Range Status  05/08/2020 4.2 3.5 - 5.2 mmol/L Final         Passed - Patient is not pregnant      Passed - Last BP in normal range    BP Readings from Last 1 Encounters:  05/08/20 124/77         Passed - Valid encounter within last 6 months    Recent Outpatient Visits          2 weeks ago Type 2 diabetes mellitus with obesity ([place])   [place], Jolene T, NP   3 months ago Type 2 diabetes mellitus without complication, without long-term current use of insulin ([place])   [place], [place] T, NP   6 months ago Type 2 diabetes mellitus without complication, without long-term current use of insulin ([place])   [place], Jolene T, NP   10 months ago Type 2 diabetes mellitus without complication, without long-term current use of insulin ([place])   [place], [place] T, NP   1 year ago Type 2 diabetes mellitus without complication, without long-term current use of insulin ([place])   [place], Barbaraann Faster, NP      Future Appointments            In 2 months [place], Barbaraann Faster, NP [facility]   In 5 months  [facility]

## 2020-05-30 ENCOUNTER — Other Ambulatory Visit: Payer: Self-pay | Admitting: Nurse Practitioner

## 2020-05-30 MED ORDER — ONDANSETRON HCL 4 MG PO TABS: 4.0000 mg | ORAL_TABLET | Freq: Three times a day (TID) | ORAL | 0 refills | Status: DC | PRN

## 2020-06-08 DIAGNOSIS — U071 COVID-19: Secondary | ICD-10-CM | POA: Diagnosis not present

## 2020-06-08 DIAGNOSIS — Z20822 Contact with and (suspected) exposure to covid-19: Secondary | ICD-10-CM | POA: Diagnosis not present

## 2020-06-11 ENCOUNTER — Other Ambulatory Visit: Payer: Self-pay | Admitting: Nurse Practitioner

## 2020-06-11 NOTE — Telephone Encounter (Signed)
Requested Prescriptions  Pending Prescriptions Disp Refills  . hydrochlorothiazide (HYDRODIURIL) 25 MG tablet [Pharmacy Med Name: HYDROCHLOROTHIAZIDE 25 MG TAB] 90 tablet 1    Sig: TAKE 1 TABLET BY MOUTH EVERY DAY     Cardiovascular: Diuretics - Thiazide Passed - 06/11/2020  1:12 AM      Passed - Ca in normal range and within 360 days    Calcium  Date Value Ref Range Status  05/08/2020 9.5 8.7 - 10.3 mg/dL Final         Passed - Cr in normal range and within 360 days    Creatinine, Ser  Date Value Ref Range Status  05/08/2020 0.75 0.57 - 1.00 mg/dL Final         Passed - K in normal range and within 360 days    Potassium  Date Value Ref Range Status  05/08/2020 4.2 3.5 - 5.2 mmol/L Final         Passed - Na in normal range and within 360 days    Sodium  Date Value Ref Range Status  05/08/2020 138 134 - 144 mmol/L Final         Passed - Last BP in normal range    BP Readings from Last 1 Encounters:  05/08/20 124/77         Passed - Valid encounter within last 6 months    Recent Outpatient Visits          1 month ago Type 2 diabetes mellitus with obesity ([place])   [place], Jolene T, NP   4 months ago Type 2 diabetes mellitus without complication, without long-term current use of insulin ([place])   [place], Jolene T, NP   7 months ago Type 2 diabetes mellitus without complication, without long-term current use of insulin ([place])   [place], Jolene T, NP   10 months ago Type 2 diabetes mellitus without complication, without long-term current use of insulin ([place])   [place], [place] T, NP   1 year ago Type 2 diabetes mellitus without complication, without long-term current use of insulin ([place])   [place], Barbaraann Faster, NP      Future Appointments            In 1 month Cannady, Barbaraann Faster, NP [facility]   In 4 months  [facility]

## 2020-06-17 DIAGNOSIS — L570 Actinic keratosis: Secondary | ICD-10-CM | POA: Diagnosis not present

## 2020-06-17 DIAGNOSIS — L718 Other rosacea: Secondary | ICD-10-CM | POA: Diagnosis not present

## 2020-06-17 DIAGNOSIS — L814 Other melanin hyperpigmentation: Secondary | ICD-10-CM | POA: Diagnosis not present

## 2020-07-01 ENCOUNTER — Other Ambulatory Visit: Payer: Self-pay

## 2020-07-01 ENCOUNTER — Other Ambulatory Visit: Payer: Self-pay | Admitting: Nurse Practitioner

## 2020-07-01 DIAGNOSIS — Z1231 Encounter for screening mammogram for malignant neoplasm of breast: Secondary | ICD-10-CM

## 2020-07-24 ENCOUNTER — Other Ambulatory Visit: Payer: Self-pay | Admitting: Nurse Practitioner

## 2020-08-01 ENCOUNTER — Other Ambulatory Visit: Payer: Self-pay

## 2020-08-01 ENCOUNTER — Ambulatory Visit
Admission: RE | Admit: 2020-08-01 | Discharge: 2020-08-01 | Disposition: A | Discharge status: Home / Self Care | Payer: Medicare Other | Source: Ambulatory Visit | Attending: Nurse Practitioner | Admitting: Nurse Practitioner

## 2020-08-01 DIAGNOSIS — Z1231 Encounter for screening mammogram for malignant neoplasm of breast: Secondary | ICD-10-CM | POA: Diagnosis not present

## 2020-08-08 ENCOUNTER — Ambulatory Visit (INDEPENDENT_AMBULATORY_CARE_PROVIDER_SITE_OTHER): Payer: Medicare Other | Admitting: Nurse Practitioner

## 2020-08-08 ENCOUNTER — Encounter: Payer: Self-pay | Admitting: Nurse Practitioner

## 2020-08-08 ENCOUNTER — Other Ambulatory Visit: Payer: Self-pay

## 2020-08-08 VITALS — BP 124/77 | HR 75 | Temp 98.6°F | Wt 171.0 lb

## 2020-08-08 DIAGNOSIS — F5101 Primary insomnia: Secondary | ICD-10-CM | POA: Diagnosis not present

## 2020-08-08 DIAGNOSIS — I152 Hypertension secondary to endocrine disorders: Secondary | ICD-10-CM

## 2020-08-08 DIAGNOSIS — Z23 Encounter for immunization: Secondary | ICD-10-CM

## 2020-08-08 DIAGNOSIS — G4700 Insomnia, unspecified: Secondary | ICD-10-CM | POA: Insufficient documentation

## 2020-08-08 DIAGNOSIS — E1159 Type 2 diabetes mellitus with other circulatory complications: Secondary | ICD-10-CM | POA: Diagnosis not present

## 2020-08-08 DIAGNOSIS — E785 Hyperlipidemia, unspecified: Secondary | ICD-10-CM

## 2020-08-08 DIAGNOSIS — E669 Obesity, unspecified: Secondary | ICD-10-CM

## 2020-08-08 DIAGNOSIS — E1169 Type 2 diabetes mellitus with other specified complication: Secondary | ICD-10-CM

## 2020-08-08 LAB — BAYER DCA HB A1C WAIVED: HB A1C (BAYER DCA - WAIVED): 6.5 % (ref <7.0)

## 2020-08-08 MED ORDER — TRAZODONE HCL 50 MG PO TABS: 25.0000 mg | ORAL_TABLET | Freq: Every evening | ORAL | 3 refills | Status: DC | PRN

## 2020-08-08 NOTE — Assessment & Plan Note (Signed)
New diagnosis since Covid, is cutting back on caffeine which is helping, but still having difficulty with sleep.  Will refer for sleep testing, concern for OSA.  Start Trazodone 25-50 MG as needed, educated her on this and reviewed BEERS criteria with her -- to avoid Benadryl use.  Return in 3 months, sooner if worsening.

## 2020-08-08 NOTE — Progress Notes (Signed)
BP 124/77   Pulse 75   Temp 98.6 F (37 C)   Wt 171 lb (77.6 kg)   LMP 07/13/2000 (Approximate)   SpO2 97%   BMI 31.28 kg/m    Subjective:    Patient ID: Charlene Juarez, female    DOB: [birth year], 69 y.o.   MRN: [id number]  HPI: Charlene Juarez is a 69 y.o. female  Chief Complaint  Patient presents with  . Diabetes  . Hypertension  . Insomnia    pt states she has been having trouble falling asleep and staying asleep    DIABETES Continues on Metformin, Jardiance, and Ozempic. Recent A1C was 6.6% last visit.   Hypoglycemic episodes:no Polydipsia/polyuria: no Visual disturbance: no Chest pain: no Paresthesias: no Glucose Monitoring: yes  Accucheck frequency: occasionally  Fasting glucose: 140 range  Post prandial:  Evening:  Before meals: Taking Insulin?: no  Long acting insulin:  Short acting insulin: Blood Pressure Monitoring: not checking Retinal Examination: Not Up To Date Foot Exam: Up to Date Pneumovax: Up to Date Influenza: Up to Date Aspirin: no   HYPERTENSION / HYPERLIPIDEMIA Continues on Micardis, HCTZ, and Simvastatin. Satisfied with current treatment? yes Duration of hypertension: chronic BP monitoring frequency: rarely BP range: 120-130/70's BP medication side effects: no Duration of hyperlipidemia: chronic Cholesterol medication side effects: no Cholesterol supplements: none Medication compliance: good compliance Aspirin: no Recent stressors: no Recurrent headaches: no Visual changes: no Palpitations: no Dyspnea: no Chest pain: no Lower extremity edema: no Dizzy/lightheaded: no  The 10-year ASCVD risk score Mikey Bussing DC Jr., et al., 2013) is: 17.7%   Values used to calculate the score:     Age: 70 years     Sex: Female     Is Non-Hispanic African American: No     Diabetic: Yes     Tobacco smoker: No     Systolic Blood Pressure: 353 mmHg     Is BP treated: Yes     HDL Cholesterol: 43 mg/dL     Total Cholesterol:  166 mg/dL   INSOMNIA Reports this has been present since she had COVID (had around September 18th) and has had episodes of heart beating fast on occasion -- notices it more with caffeine intake, so has cut back on this and cut back before bedtime.  Used to drink a lot of coffee and now using decaf.  Does not feel these episodes of fast heart beat with reduction in caffeine. Duration: months Satisfied with sleep quality: no Difficulty falling asleep: no Difficulty staying asleep: yes Waking a few hours after sleep onset: yes Early morning awakenings: yes Daytime hypersomnolence: yes Wakes feeling refreshed: yes Good sleep hygiene: yes Apnea: no Snoring: yes Depressed/anxious mood: no Recent stress: no Restless legs/nocturnal leg cramps: no Chronic pain/arthritis: no History of sleep study: no Treatments attempted: Benadryl and Melatonin  Relevant past medical, surgical, family and social history reviewed and updated as indicated. Interim medical history since our last visit reviewed. Allergies and medications reviewed and updated.  Review of Systems  Constitutional: Negative for activity change, appetite change, diaphoresis, fatigue and fever.  Respiratory: Negative for cough, chest tightness, shortness of breath and wheezing.   Cardiovascular: Negative for chest pain, palpitations and leg swelling.  Gastrointestinal: Negative.   Endocrine: Negative for cold intolerance, heat intolerance, polydipsia, polyphagia and polyuria.  Neurological: Negative.   Psychiatric/Behavioral: Negative.     Per HPI unless specifically indicated above     Objective:    BP 124/77   Pulse 75  Temp 98.6 F (37 C)   Wt 171 lb (77.6 kg)   LMP 07/13/2000 (Approximate)   SpO2 97%   BMI 31.28 kg/m   Wt Readings from Last 3 Encounters:  08/08/20 171 lb (77.6 kg)  05/08/20 170 lb 12.8 oz (77.5 kg)  01/29/20 166 lb 6.4 oz (75.5 kg)    Physical Exam Vitals and nursing note reviewed.   Constitutional:      General: She is awake. She is not in acute distress.    Appearance: She is well-developed. She is not ill-appearing.  HENT:     Head: Normocephalic.     Right Ear: Hearing normal.     Left Ear: Hearing normal.     Nose: Nose normal.     Mouth/Throat:     Mouth: Mucous membranes are moist.  Eyes:     General: Lids are normal.        Right eye: No discharge.        Left eye: No discharge.     Conjunctiva/sclera: Conjunctivae normal.     Pupils: Pupils are equal, round, and reactive to light.  Neck:     Thyroid: No thyromegaly.     Vascular: No carotid bruit.  Cardiovascular:     Rate and Rhythm: Normal rate and regular rhythm.     Heart sounds: Normal heart sounds. No murmur heard.  No gallop.   Pulmonary:     Effort: Pulmonary effort is normal. No accessory muscle usage or respiratory distress.     Breath sounds: Normal breath sounds.  Abdominal:     General: Bowel sounds are normal.     Palpations: Abdomen is soft.     Tenderness: There is no abdominal tenderness.  Musculoskeletal:     Cervical back: Normal range of motion and neck supple.     Right lower leg: No edema.     Left lower leg: No edema.  Lymphadenopathy:     Cervical: No cervical adenopathy.  Skin:    General: Skin is warm and dry.  Neurological:     Mental Status: She is alert and oriented to person, place, and time.  Psychiatric:        Attention and Perception: Attention normal.        Mood and Affect: Mood normal.        Speech: Speech normal.        Behavior: Behavior normal. Behavior is cooperative.        Thought Content: Thought content normal.        Judgment: Judgment normal.     Results for orders placed or performed in visit on 05/08/20  Bayer DCA Hb A1c Waived  Result Value Ref Range   HB A1C (BAYER DCA - WAIVED) 6.6 <6.4 %  Basic metabolic panel  Result Value Ref Range   Glucose 112 (H) 65 - 99 mg/dL   BUN 19 8 - 27 mg/dL   Creatinine, Ser 0.75 0.57 - 1.00  mg/dL   GFR calc non Af Amer 82 >59 mL/min/1.73   GFR calc Af Amer 95 >59 mL/min/1.73   BUN/Creatinine Ratio 25 12 - 28   Sodium 138 134 - 144 mmol/L   Potassium 4.2 3.5 - 5.2 mmol/L   Chloride 99 96 - 106 mmol/L   CO2 25 20 - 29 mmol/L   Calcium 9.5 8.7 - 10.3 mg/dL  Lipid Panel w/o Chol/HDL Ratio  Result Value Ref Range   Cholesterol, Total 166 100 - 199 mg/dL   Triglycerides  414 (H) 0 - 149 mg/dL   HDL 43 >39 mg/dL   VLDL Cholesterol Cal 63 (H) 5 - 40 mg/dL   LDL Chol Calc (NIH) 60 0 - 99 mg/dL      Assessment & Plan:   Problem List Items Addressed This Visit      Cardiovascular and Mediastinum   Hypertension associated with diabetes ([place])    Chronic, stable with BP at goal in office and at home.  Continue current medication regimen, Micardis for kidney protection, and adjust as needed.  BMP next visit.  Recommend she continue to check BP at home on occasion and document for visits.  Focus on DASH diet.  Return in 3 months.        Endocrine   Type 2 diabetes mellitus with obesity (HCC) - Primary    Chronic, stable with A1C 6.5%, slight trend downwards -- she will monitor diet as is going into Christmas season. Urine ALB 10 and A:C < 30 at recent visit.  Continue current medication regimen, including Jardiance, Metformin, and Ozempic.  To notify provider if sugars consistently read > 130, continue to monitor these at home closely.  Recommend return to diet and exercise focus.  Return in 3 months.      Relevant Orders   Bayer DCA Hb A1c Waived   Hyperlipidemia associated with type 2 diabetes mellitus (HCC)    Chronic, ongoing.  Continue current medication regimen and adjust as needed.  Lipid panel today.      Relevant Orders   Lipid Panel w/o Chol/HDL Ratio   Bayer DCA Hb A1c Waived     Other   Insomnia    New diagnosis since Covid, is cutting back on caffeine which is helping, but still having difficulty with sleep.  Will refer for sleep testing, concern for OSA.  Start  Trazodone 25-50 MG as needed, educated her on this and reviewed BEERS criteria with her -- to avoid Benadryl use.  Return in 3 months, sooner if worsening.      Relevant Orders   Ambulatory referral to Sleep Studies    Other Visit Diagnoses    Need for influenza vaccination       Flu vaccine today   Relevant Orders   Flu Vaccine QUAD High Dose(Fluad) (Completed)       Follow up plan: Return in about 3 months (around 11/08/2020) for T2DM, HTN/HLD, GERD, ASTHMA, OSTEOPENIA.

## 2020-08-08 NOTE — Assessment & Plan Note (Signed)
Chronic, ongoing.  Continue current medication regimen and adjust as needed. Lipid panel today. 

## 2020-08-08 NOTE — Assessment & Plan Note (Signed)
Chronic, stable with A1C 6.5%, slight trend downwards -- she will monitor diet as is going into Christmas season. Urine ALB 10 and A:C < 30 at recent visit.  Continue current medication regimen, including Jardiance, Metformin, and Ozempic.  To notify provider if sugars consistently read > 130, continue to monitor these at home closely.  Recommend return to diet and exercise focus.  Return in 3 months.

## 2020-08-08 NOTE — Patient Instructions (Signed)

## 2020-08-08 NOTE — Assessment & Plan Note (Signed)
Chronic, stable with BP at goal in office and at home.  Continue current medication regimen, Micardis for kidney protection, and adjust as needed.  BMP next visit.  Recommend she continue to check BP at home on occasion and document for visits.  Focus on DASH diet.  Return in 3 months.

## 2020-08-09 LAB — LIPID PANEL W/O CHOL/HDL RATIO
Cholesterol, Total: 145 mg/dL (ref 100–199)
HDL: 52 mg/dL (ref >39)
LDL Chol Calc (NIH): 64 mg/dL (ref 0–99)
Triglycerides: 172 mg/dL — ABNORMAL HIGH (ref 0–149)
VLDL Cholesterol Cal: 29 mg/dL (ref 5–40)

## 2020-08-10 NOTE — Progress Notes (Signed)
Contacted via MyChart  Good morning [name], your labs have returned and LDL (bad cholesterol) level is at goal.  Triglycerides remain slightly elevated, continue your statin and also recommend adding a little fish oil daily to help lower these.  Any questions? Keep being awesome!!  Thank you for allowing me to participate in your care. Kindest regards, Charlene Juarez

## 2020-08-16 ENCOUNTER — Other Ambulatory Visit: Payer: Self-pay | Admitting: Nurse Practitioner

## 2020-08-16 NOTE — Telephone Encounter (Signed)
Requested Prescriptions  Pending Prescriptions Disp Refills  . metFORMIN (GLUCOPHAGE) 1000 MG tablet [Pharmacy Med Name: METFORMIN HCL 1,000 MG TABLET] 180 tablet 1    Sig: TAKE 1 TABLET (1,000 MG TOTAL) BY MOUTH 2 (TWO) TIMES DAILY WITH A MEAL.     Endocrinology:  Diabetes - Biguanides Passed - 08/16/2020  8:46 AM      Passed - Cr in normal range and within 360 days    Creatinine, Ser  Date Value Ref Range Status  05/08/2020 0.75 0.57 - 1.00 mg/dL Final         Passed - HBA1C is between 0 and 7.9 and within 180 days    HB A1C (BAYER DCA - WAIVED)  Date Value Ref Range Status  08/08/2020 6.5 <7.0 % Final    Comment:                                          Diabetic Adult            <7.0                                       Healthy Adult        4.3 - 5.7                                                           (DCCT/NGSP) American Diabetes Association's Summary of Glycemic Recommendations for Adults with Diabetes: Hemoglobin A1c <7.0%. More stringent glycemic goals (A1c <6.0%) may further reduce complications at the cost of increased risk of hypoglycemia.          Passed - eGFR in normal range and within 360 days    GFR calc Af Amer  Date Value Ref Range Status  05/08/2020 95 >59 mL/min/1.73 Final    Comment:    **[facility] currently reports eGFR in compliance with the current**   recommendations of the [employer]. [facility] will   update reporting as new guidelines are published from the NKF-ASN   Task force.    GFR calc non Af Amer  Date Value Ref Range Status  05/08/2020 82 >59 mL/min/1.73 Final         Passed - Valid encounter within last 6 months    Recent Outpatient Visits          1 week ago Type 2 diabetes mellitus with obesity ([place])   [place], Jolene T, NP   3 months ago Type 2 diabetes mellitus with obesity ([place])   [place], Jolene T, NP   6 months ago Type 2 diabetes mellitus without  complication, without long-term current use of insulin ([place])   [place], Jolene T, NP   9 months ago Type 2 diabetes mellitus without complication, without long-term current use of insulin ([place])   [place], [place] T, NP   1 year ago Type 2 diabetes mellitus without complication, without long-term current use of insulin ([place])   [place], Barbaraann Faster, NP      Future Appointments  In 2 months  [facility]   In 3 months [place], Barbaraann Faster, NP [facility]

## 2020-08-18 ENCOUNTER — Telehealth: Payer: Self-pay

## 2020-08-18 NOTE — Telephone Encounter (Signed)
I placed referral for sleep study recently, placed on it for [facility] if covered by insurance as this is local group.  Can we check on this referral please and let her know.

## 2020-08-18 NOTE — Telephone Encounter (Signed)
Copied from [place] [id number]. Topic: Quick Communication - See Telephone Encounter >> Aug 18, 2020 11:03 AM Loma [place] wrote: CRM for notification. See Telephone encounter for: 08/18/20. Copied [id number] Charlene Juarez (Patient) Charlene Juarez, [name] (Patient) Referral - Status Summary: Sleep study in [place] request Reason for CRM: Pt wants to have her sleep study in [place] preferably instead of [place]. [facility])   Best contact: [phone] Pt states that she has called and Lovena Le needs a fax to [phone]. Pt states wants on this year's ins.

## 2020-08-18 NOTE — Telephone Encounter (Signed)
Patient is requesting that I send referral to [facility]. Looks like they can get her in before January. That referral has been sent. Please let me know if I need to do anything differently.

## 2020-08-18 NOTE — Telephone Encounter (Signed)
That is fine, thank you for your assistance, as always you are amazing!!

## 2020-08-18 NOTE — Telephone Encounter (Signed)
Please see message from [facility]

## 2020-08-18 NOTE — Addendum Note (Signed)
Addended by: Marnee Guarneri T on: 08/18/2020 11:59 AM   Modules accepted: Orders

## 2020-08-21 ENCOUNTER — Ambulatory Visit: Payer: Medicare Other | Attending: Neurology

## 2020-08-21 DIAGNOSIS — G4733 Obstructive sleep apnea (adult) (pediatric): Secondary | ICD-10-CM | POA: Diagnosis not present

## 2020-08-21 DIAGNOSIS — R0683 Snoring: Secondary | ICD-10-CM

## 2020-08-22 ENCOUNTER — Other Ambulatory Visit: Payer: Self-pay

## 2020-08-30 ENCOUNTER — Other Ambulatory Visit: Payer: Self-pay | Admitting: Nurse Practitioner

## 2020-09-02 ENCOUNTER — Ambulatory Visit: Payer: Medicare Other | Attending: Neurology

## 2020-09-02 DIAGNOSIS — G4733 Obstructive sleep apnea (adult) (pediatric): Secondary | ICD-10-CM | POA: Diagnosis not present

## 2020-09-02 DIAGNOSIS — R0683 Snoring: Secondary | ICD-10-CM

## 2020-09-03 ENCOUNTER — Other Ambulatory Visit: Payer: Self-pay

## 2020-09-05 IMAGING — MG DIGITAL SCREENING BILAT W/ TOMO W/ CAD
8 series · 8 of 24 positions shown · non-contrast
Comparison: Previous exam(s).

CLINICAL DATA: Screening.

EXAM:
DIGITAL SCREENING BILATERAL MAMMOGRAM WITH TOMO AND CAD

[L CC synth-2D]
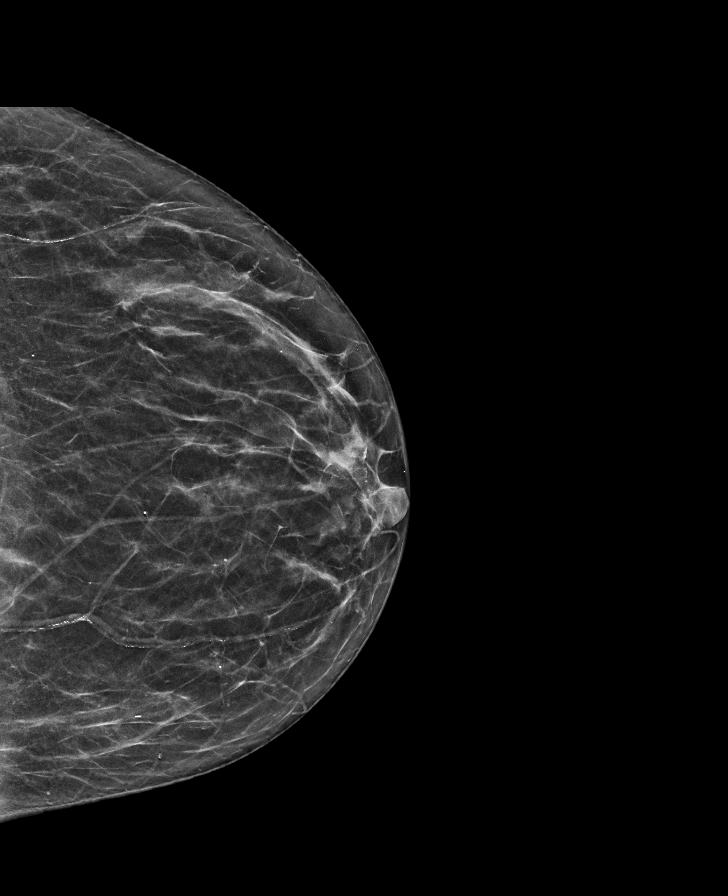

[R CC synth-2D]
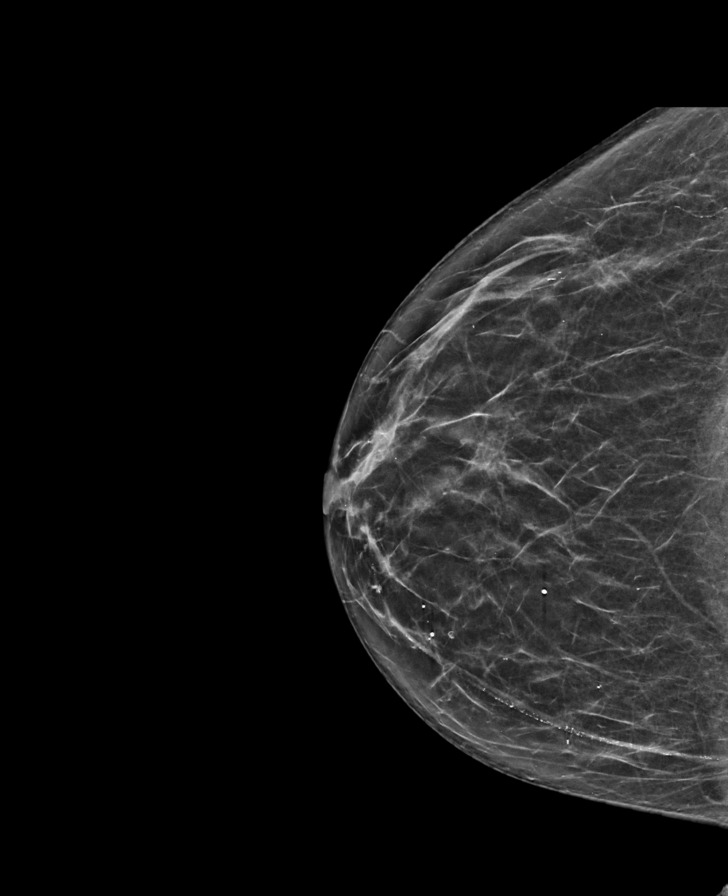

[L MLO synth-2D]
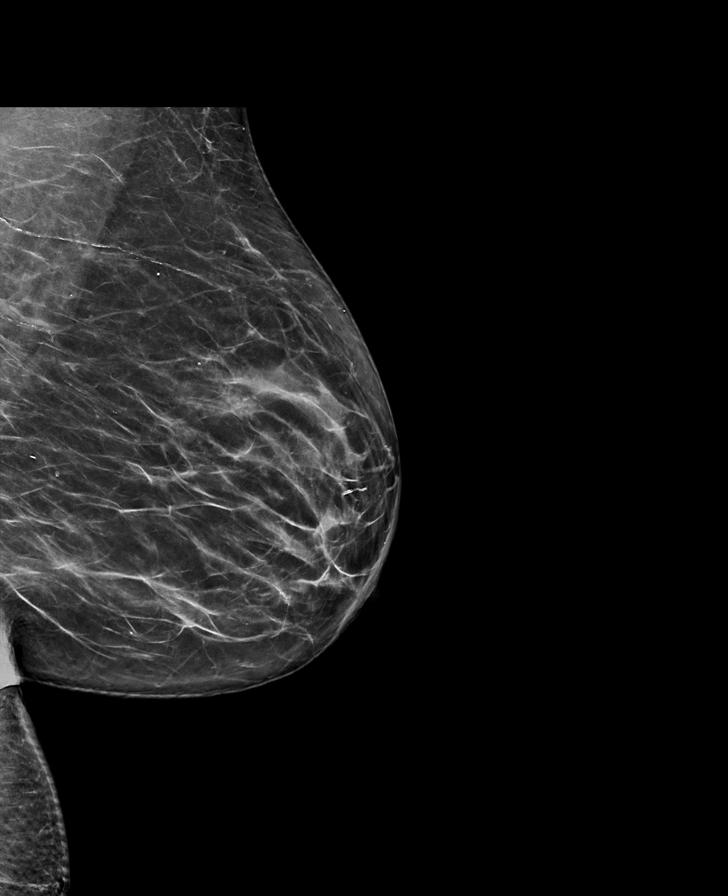

[R MLO synth-2D]
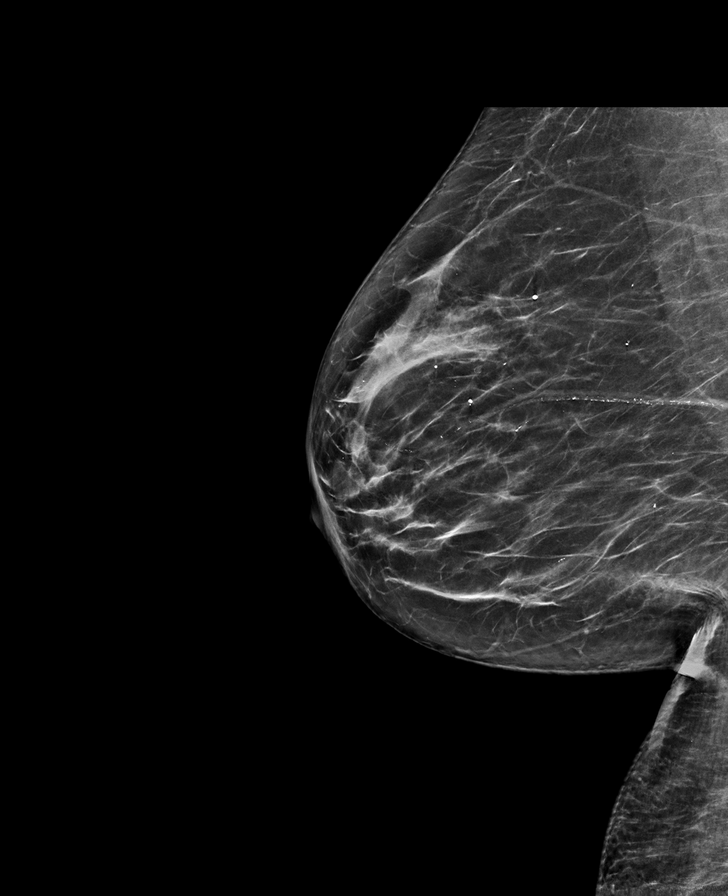

[R CC tomo · tomo slice 31/60.0]
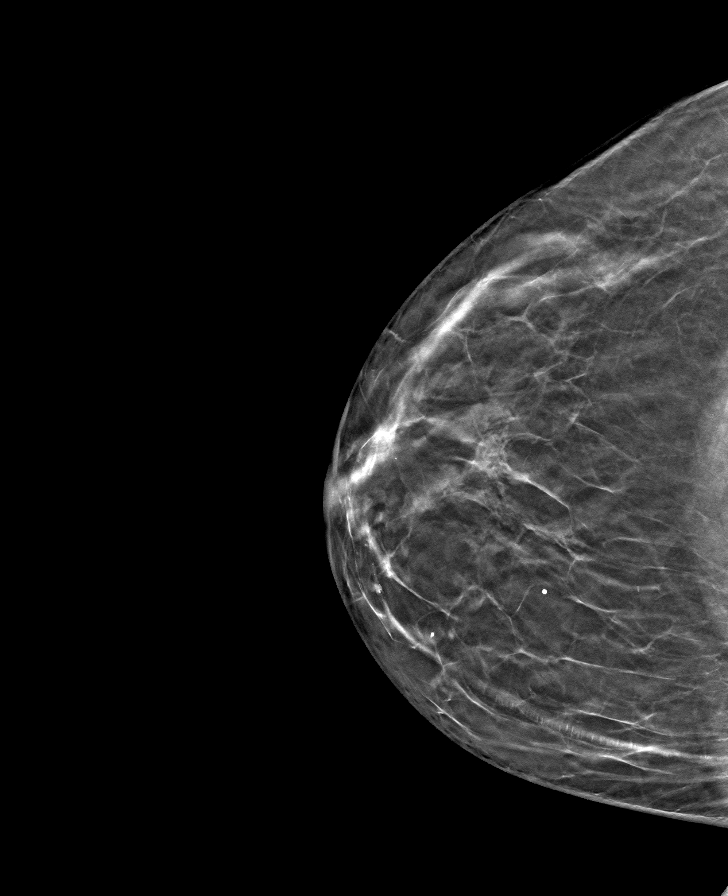

[R MLO tomo · tomo slice 35/68.0]
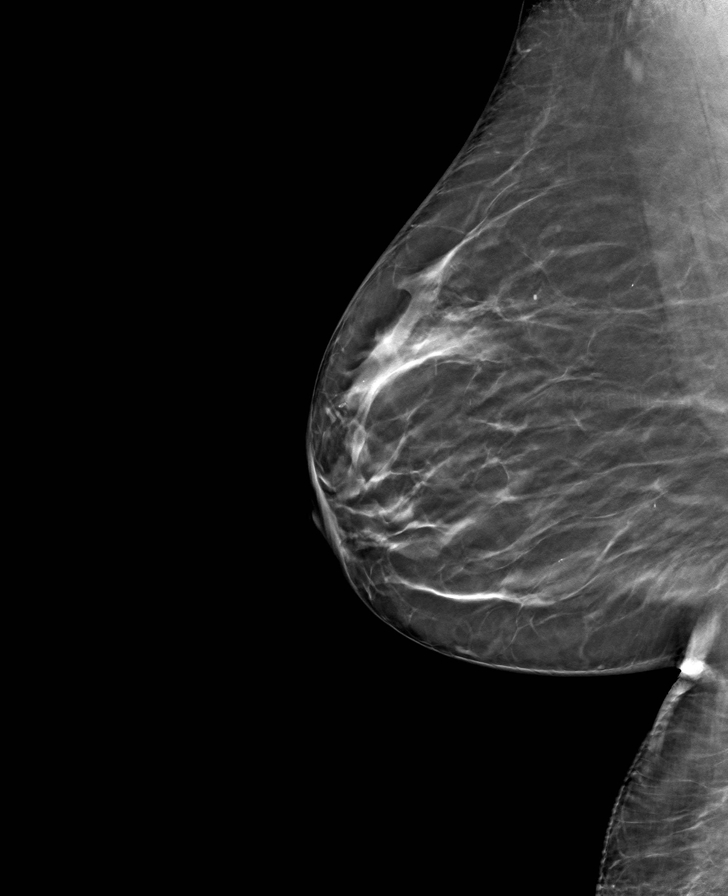

[L CC tomo · tomo slice 28/55.0]
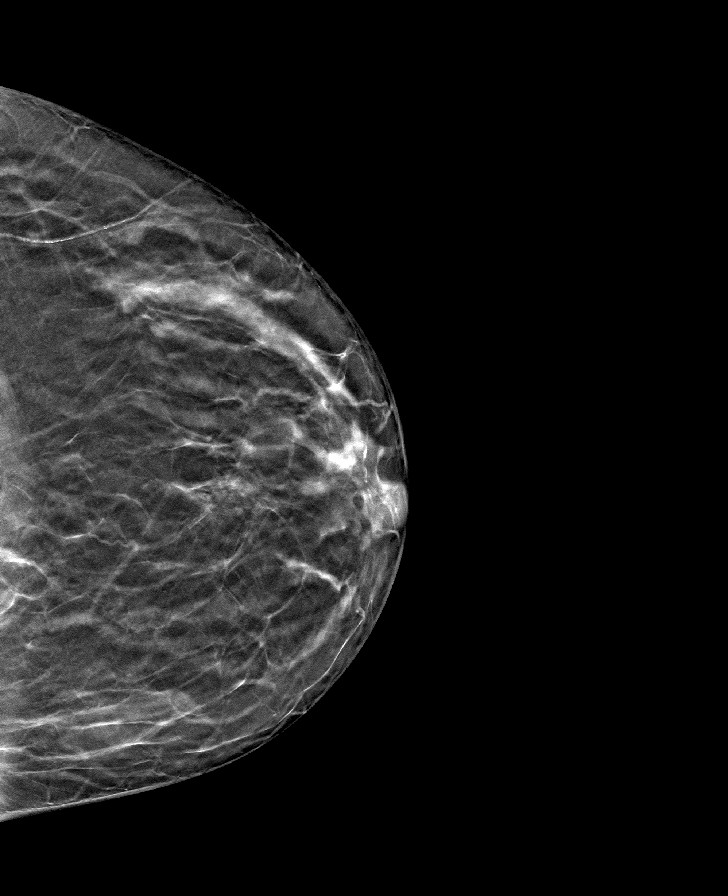

[L MLO tomo · tomo slice 36/71.0]
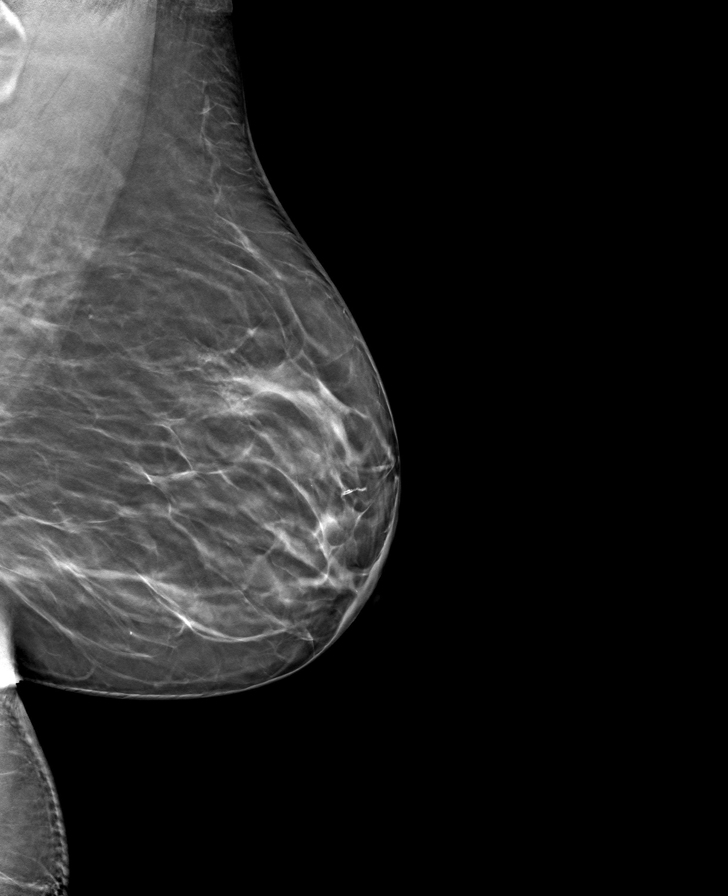

[8 of 24 positions shown; findings below may reference images not displayed]

ACR Breast Density Category b: There are scattered areas of
fibroglandular density.
FINDINGS: There are no findings suspicious for malignancy. Images were
processed with CAD.
IMPRESSION: No mammographic evidence of malignancy. A result letter of this
screening mammogram will be mailed directly to the patient.

RECOMMENDATION:
Screening mammogram in one year. (Code:[id number])

BI-RADS CATEGORY  1: Negative.

## 2020-09-09 ENCOUNTER — Other Ambulatory Visit: Payer: Self-pay | Admitting: Nurse Practitioner

## 2020-09-09 ENCOUNTER — Telehealth: Payer: Self-pay

## 2020-09-09 NOTE — Telephone Encounter (Signed)
Copied from [place] [id number]. Topic: Quick Communication - Other Results (Clinic Use ONLY) >> Sep 09, 2020  2:07 PM Rayann Heman wrote: Spoke with patient. She is requesting a call back regarding Sleep study. Looks like this has been done and she is just waiting on someone to explain results.

## 2020-09-09 NOTE — Telephone Encounter (Signed)
Recommend she reach out to sleep study center

## 2020-09-09 NOTE — Telephone Encounter (Signed)
Have you seen the results yet, or does she need to speak with the office that did the study?

## 2020-09-09 NOTE — Telephone Encounter (Signed)
Patient notified

## 2020-09-13 NOTE — Progress Notes (Signed)
I have added loud snoring as a diagnosis, this was patient report. Thank you.

## 2020-09-16 NOTE — Telephone Encounter (Signed)
Please see message. °

## 2020-09-16 NOTE — Telephone Encounter (Signed)
Called and left a message for Ladona Ridgel at [facility] to return my call.

## 2020-09-16 NOTE — Telephone Encounter (Signed)
Patient is calling to receive the sleep study results. Patient was advised by sleep center to reach out to [facility]. And the CPAP would need to be ordered by Jolene. Patient was would like the CPAP before the end of the year so that she does not have to pay a big deductible again. Please advise. CB- [phone]

## 2020-09-16 NOTE — Telephone Encounter (Signed)
Pt called and is requesting call back from office with feedback about request. Pt understands everything stated by PCP, she again urges to have this study before the end of the year so she can avoid having a deductible.

## 2020-09-16 NOTE — Telephone Encounter (Signed)
Please let her know they do recommend Bipap.  I just reviewed note which is now scanned in chart, I apologize for confusion as often the sleep center orders supplies.  We will work on ordering these as soon as possible.  Tiffany can you assist with this order -- levels are on the 09/05/20 scanned in note, not sure why sleep center is not ordering these.  Where can we send these supply orders?

## 2020-09-17 NOTE — Telephone Encounter (Signed)
Order sent to [facility] 

## 2020-09-17 NOTE — Telephone Encounter (Signed)
Patient notified that order has been faxed to [facility].

## 2020-09-17 NOTE — Telephone Encounter (Signed)
Spoke with patient over the phone.

## 2020-09-17 NOTE — Telephone Encounter (Signed)
Pt called stating that she would prefer to have these papers faxed to [facility] in [place]. Please advise.

## 2020-11-06 ENCOUNTER — Ambulatory Visit: Payer: Medicare Other

## 2020-11-14 ENCOUNTER — Ambulatory Visit (INDEPENDENT_AMBULATORY_CARE_PROVIDER_SITE_OTHER): Payer: Medicare Other

## 2020-11-14 VITALS — Ht 62.0 in | Wt 170.0 lb

## 2020-11-14 DIAGNOSIS — Z0000 Encounter for general adult medical examination without abnormal findings: Secondary | ICD-10-CM

## 2020-11-14 NOTE — Progress Notes (Signed)
I connected with Moody Bruins today by telephone and verified that I am speaking with the correct person using two identifiers. Location patient: home Location provider: work Persons participating in the virtual visit: Charlene Juarez, Glenna Durand LPN.   I discussed the limitations, risks, security and privacy concerns of performing an evaluation and management service by telephone and the availability of in person appointments. I also discussed with the patient that there may be a patient responsible charge related to this service. The patient expressed understanding and verbally consented to this telephonic visit.    Interactive audio and video telecommunications were attempted between this provider and patient, however failed, due to patient having technical difficulties OR patient did not have access to video capability.  We continued and completed visit with audio only.     Vital signs may be patient reported or missing.  Subjective:   Charlene Juarez is a 70 y.o. female who presents for Medicare Annual (Subsequent) preventive examination.  Review of Systems     Cardiac Risk Factors include: advanced age (>33men, >52 women);diabetes mellitus;dyslipidemia;hypertension;obesity (BMI >30kg/m2);sedentary lifestyle     Objective:    Today's Vitals   11/14/20 1341  Weight: 170 lb (77.1 kg)  Height: 5\' 2"  (1.575 m)   Body mass index is 31.09 kg/m.  Advanced Directives 11/14/2020 10/31/2019 10/19/2018 10/14/2017 01/13/2017  Does Patient Have a Medical Advance Directive? Yes Yes Yes Yes Yes  Type of Advance Directive Living will Living will;Healthcare Power of [place];Living will Living will Living will  Does patient want to make changes to medical advance directive? - - - - No - Patient declined  Copy of [place] in Chart? - No - copy requested No - copy requested - -    Current Medications (verified) Outpatient  Encounter Medications as of 11/14/2020  Medication Sig  . Ascorbic Acid (VITAMIN C) 1000 MG tablet Take 1,000 mg by mouth daily.  . Cholecalciferol (VITAMIN D3) 1000 units CAPS Take 1,000 Units by mouth at bedtime.  . Ginkgo Biloba 40 MG TABS Take 1 tablet by mouth daily.  Marland Kitchen glucose blood (BAYER CONTOUR TEST) test strip Contour test strips ( or insurance preferred) used to check blood sugar BID. DX: E11.9  . glucose blood test strip 1 each by Other route as needed for other. Use as instructed  . hydrochlorothiazide (HYDRODIURIL) 25 MG tablet TAKE 1 TABLET BY MOUTH EVERY DAY  . Ivermectin 1 % CREA Apply topically daily.  Marland Kitchen JARDIANCE 25 MG TABS tablet TAKE 1 TABLET BY MOUTH EVERY DAY  . Magnesium 500 MG TABS Take 500 mg by mouth at bedtime.  . metFORMIN (GLUCOPHAGE) 1000 MG tablet TAKE 1 TABLET (1,000 MG TOTAL) BY MOUTH 2 (TWO) TIMES DAILY WITH A MEAL.  Marland Kitchen Omega-3 Fatty Acids (FISH OIL) 1200 MG CAPS Take 1,200-2,400 mg by mouth 2 (two) times daily. 1 in the am and 2 at night  . ondansetron (ZOFRAN) 4 MG tablet Take 1 tablet (4 mg total) by mouth every 8 (eight) hours as needed for nausea or vomiting.  . Semaglutide, 1 MG/DOSE, (OZEMPIC, 1 MG/DOSE,) 2 MG/1.5ML SOPN Inject 0.75 mLs (1 mg total) into the skin once a week.  . telmisartan (MICARDIS) 80 MG tablet TAKE 1 TABLET BY MOUTH EVERY DAY  . simvastatin (ZOCOR) 20 MG tablet TAKE 1 TABLET BY MOUTH EVERY DAY (Patient not taking: Reported on 11/14/2020)  . SYMBICORT 160-4.5 MCG/ACT inhaler TAKE 2 PUFFS BY MOUTH TWICE A DAY (Patient  not taking: No sig reported)  . traZODone (DESYREL) 50 MG tablet Take 0.5-1 tablets (25-50 mg total) by mouth at bedtime as needed for sleep. (Patient not taking: Reported on 11/14/2020)  . VENTOLIN HFA 108 (90 Base) MCG/ACT inhaler TAKE 2 PUFFS BY MOUTH EVERY 6 HOURS AS NEEDED FOR WHEEZE OR SHORTNESS OF BREATH (Patient not taking: Reported on 11/14/2020)  . vitamin E 200 UNIT capsule Take 200 Units by mouth daily. (Patient not  taking: Reported on 11/14/2020)   No facility-administered encounter medications on file as of 11/14/2020.    Allergies (verified) Codeine sulfate and Lisinopril   History: Past Medical History:  Diagnosis Date  . Arthritis   . Bursitis   . Cancer ([place])    SKIN  . Diabetes mellitus without complication ([place])   . GERD (gastroesophageal reflux disease)   . Hyperlipidemia   . Hypertension   . Sleep apnea    Past Surgical History:  Procedure Laterality Date  . cancer removal off nose    . CATARACT EXTRACTION W/PHACO Right 01/13/2017   Procedure: CATARACT EXTRACTION PHACO AND INTRAOCULAR LENS PLACEMENT (IOC);  Surgeon: Eulogio Bear, MD;  Location: [facility] ORS;  Service: Ophthalmology;  Laterality: Right;  [place] 31.1 AP% 0.0 CDE 1.78 Fluid Pack lot # [id number]  . CATARACT EXTRACTION W/PHACO Left 02/24/2017   Procedure: CATARACT EXTRACTION PHACO AND INTRAOCULAR LENS PLACEMENT (IOC);  Surgeon: Eulogio Bear, MD;  Location: [facility] ORS;  Service: Ophthalmology;  Laterality: Left;  [place]  00:45.5 AP  0.7 CDE   2.76  fluid pack lot # [id number]  exp. 08-19-2018  . DILATION AND CURETTAGE OF UTERUS    . TUBAL LIGATION     Family History  Problem Relation Age of Onset  . Alcohol abuse Mother   . Heart disease Father   . Alcohol abuse Father   . Diabetes Sister   . Hyperlipidemia Sister   . Diabetes Brother   . Hypertension Brother   . Thyroid disease Daughter   . Diabetes Maternal Grandmother   . Hypertension Sister   . Aneurysm Sister   . Breast cancer Maternal Aunt    Social History   Socioeconomic History  . Marital status: Married    Spouse name: Not on file  . Number of children: 4  . Years of education: Not on file  . Highest education level: Associate degree: occupational, Hotel manager, or vocational program  Occupational History  . Occupation: retired /disabled  Tobacco Use  . Smoking status: Never Smoker  . Smokeless tobacco: Never Used  Vaping Use  . Vaping Use: Never  used  Substance and Sexual Activity  . Alcohol use: No    Alcohol/week: 0.0 standard drinks  . Drug use: No  . Sexual activity: Yes  Other Topics Concern  . Not on file  Social History Narrative  . Not on file   Social Determinants of Health   Financial Resource Strain: Low Risk   . Difficulty of Paying Living Expenses: Not hard at all  Food Insecurity: No Food Insecurity  . Worried About Charity fundraiser in the Last Year: Never true  . Ran Out of Food in the Last Year: Never true  Transportation Needs: No Transportation Needs  . Lack of Transportation (Medical): No  . Lack of Transportation (Non-Medical): No  Physical Activity: Inactive  . Days of Exercise per Week: 0 days  . Minutes of Exercise per Session: 0 min  Stress: No Stress Concern Present  . Feeling of Stress :  Not at all  Social Connections: Not on file    Tobacco Counseling Counseling given: Not Answered   Clinical Intake:  Pre-visit preparation completed: Yes  Pain : No/denies pain     Nutritional Status: BMI > 30  Obese Nutritional Risks: None Diabetes: Yes  How often do you need to have someone help you when you read instructions, pamphlets, or other written materials from your doctor or pharmacy?: 1 - Never What is the last grade level you completed in school?: associates degree  Diabetic? Yes Nutrition Risk Assessment:  Has the patient had any N/V/D within the last 2 months?  No  Does the patient have any non-healing wounds?  No  Has the patient had any unintentional weight loss or weight gain?  No   Diabetes:  Is the patient diabetic?  Yes  If diabetic, was a CBG obtained today?  No  Did the patient bring in their glucometer from home?  No  How often do you monitor your CBG's? Every other day.   Financial Strains and Diabetes Management:  Are you having any financial strains with the device, your supplies or your medication? No .  Does the patient want to be seen by Chronic Care  Management for management of their diabetes?  No  Would the patient like to be referred to a Nutritionist or for Diabetic Management?  No   Diabetic Exams:  Diabetic Eye Exam: Overdue for diabetic eye exam. Pt has been advised about the importance in completing this exam. Patient advised to call and schedule an eye exam. Diabetic Foot Exam: Overdue, Pt has been advised about the importance in completing this exam. Pt is scheduled for diabetic foot exam on next appointment.   Interpreter Needed?: No  Information entered by :: NAllen LPN   Activities of Daily Living In your present state of health, do you have any difficulty performing the following activities: 11/14/2020  Hearing? Y  Comment tinnitus  Vision? N  Difficulty concentrating or making decisions? N  Walking or climbing stairs? N  Dressing or bathing? N  Doing errands, shopping? N  Preparing Food and eating ? N  Using the Toilet? N  In the past six months, have you accidently leaked urine? N  Do you have problems with loss of bowel control? N  Managing your Medications? N  Managing your Finances? N  Housekeeping or managing your Housekeeping? N  Some recent data might be hidden    Patient Care Team: Venita Lick, NP as PCP - General (Nurse Practitioner) Oneta Rack, MD (Dermatology)  Indicate any recent Medical Services you may have received from other than [facility] providers in the past year (date may be approximate).     Assessment:   This is a routine wellness examination for Charlene Juarez.  Hearing/Vision screen No exam data present  Dietary issues and exercise activities discussed: Current Exercise Habits: The patient does not participate in regular exercise at present  Goals    . DIET - INCREASE WATER INTAKE     Recommend drinking at least 6-8 glasses of water a day     . DIET - REDUCE SUGAR INTAKE     Continue to cut out all sweets and desserts in diet to help aid in weight loss.     . Patient  Stated     11/14/2020, wants to weigh 145 pounds      Depression Screen PHQ 2/9 Scores 11/14/2020 10/31/2019 10/31/2019 10/19/2018 04/18/2018 10/14/2017 04/19/2017  PHQ - 2 Score  0 0 0 0 0 0 0  PHQ- 9 Score - - - - 0 - 1    Fall Risk Fall Risk  11/14/2020 10/31/2019 10/31/2019 10/19/2018 04/18/2018  Falls in the past year? 0 0 0 0 No  Number falls in past yr: - 0 0 - -  Injury with Fall? - 0 0 - -  Risk for fall due to : Medication side effect - - - -  Follow up Falls evaluation completed;Education provided;Falls prevention discussed - - - -    FALL RISK PREVENTION PERTAINING TO THE HOME:  Any stairs in or around the home? Yes  If so, are there any without handrails? No  Home free of loose throw rugs in walkways, pet beds, electrical cords, etc? Yes  Adequate lighting in your home to reduce risk of falls? Yes   ASSISTIVE DEVICES UTILIZED TO PREVENT FALLS:  Life alert? No  Use of a cane, walker or w/c? No  Grab bars in the bathroom? No  Shower chair or bench in shower? No  Elevated toilet seat or a handicapped toilet? No   TIMED UP AND GO:  Was the test performed? No .    Cognitive Function:     6CIT Screen 11/14/2020 10/31/2019  What Year? 0 points 0 points  What month? 0 points 0 points  What time? 0 points 0 points  Count back from 20 0 points 0 points  Months in reverse 0 points 0 points  Repeat phrase 0 points 0 points  Total Score 0 0    Immunizations Immunization History  Administered Date(s) Administered  . Fluad Quad(high Dose 65+) 05/29/2019, 08/08/2020  . Influenza, High Dose Seasonal PF 07/15/2017, 07/27/2018  . Influenza-Unspecified 06/21/2016  . Pneumococcal Conjugate-13 07/15/2017  . Pneumococcal Polysaccharide-23 05/22/2014, 05/29/2019  . Td 01/29/2020  . Tdap 03/04/2008  . Zoster 02/26/2012  . Zoster Recombinat (Shingrix) 01/31/2018, 05/02/2018, 05/04/2019    TDAP status: Up to date  Flu Vaccine status: Up to date  Pneumococcal vaccine status: Up  to date  Covid-19 vaccine status: Declined, Education has been provided regarding the importance of this vaccine but patient still declined. Advised may receive this vaccine at local pharmacy or Health Dept.or vaccine clinic. Aware to provide a copy of the vaccination record if obtained from local pharmacy or Health Dept. Verbalized acceptance and understanding.  Qualifies for Shingles Vaccine? Yes   Zostavax completed Yes   Shingrix Completed?: Yes  Screening Tests Health Maintenance  Topic Date Due  . COVID-19 Vaccine (1) Never done  . OPHTHALMOLOGY EXAM  05/03/2020  . FOOT EXAM  10/30/2020  . HEMOGLOBIN A1C  02/05/2021  . MAMMOGRAM  08/01/2022  . COLONOSCOPY (Pts 45-7yrs Insurance coverage will need to be confirmed)  09/27/2022  . TETANUS/TDAP  01/28/2030  . INFLUENZA VACCINE  Completed  . DEXA SCAN  Completed  . Hepatitis C Screening  Completed  . PNA vac Low Risk Adult  Completed    Health Maintenance  Health Maintenance Due  Topic Date Due  . COVID-19 Vaccine (1) Never done  . OPHTHALMOLOGY EXAM  05/03/2020  . FOOT EXAM  10/30/2020    Colorectal cancer screening: Type of screening: Colonoscopy. Completed 09/27/2012. Repeat every 10 years  Mammogram status: Completed 08/01/2020. Repeat every year  Bone Density status: Completed 05/23/2019.   Lung Cancer Screening: (Low Dose CT Chest recommended if Age 45-80 years, 30 pack-year currently smoking OR have quit w/in 15years.) does not qualify.   Lung Cancer Screening Referral: no  Additional Screening:  Hepatitis C Screening: does qualify; Completed 10/14/2017  Vision Screening: Recommended annual ophthalmology exams for early detection of glaucoma and other disorders of the eye. Is the patient up to date with their annual eye exam?  No  Who is the provider or what is the name of the office in which the patient attends annual eye exams? [facility] If pt is not established with a provider, would they like to be  referred to a provider to establish care? No .   Dental Screening: Recommended annual dental exams for proper oral hygiene  Community Resource Referral / Chronic Care Management: CRR required this visit?  No   CCM required this visit?  No      Plan:     I have personally reviewed and noted the following in the patient's chart:   . Medical and social history . Use of alcohol, tobacco or illicit drugs  . Current medications and supplements . Functional ability and status . Nutritional status . Physical activity . Advanced directives . List of other physicians . Hospitalizations, surgeries, and ER visits in previous 12 months . Vitals . Screenings to include cognitive, depression, and falls . Referrals and appointments  In addition, I have reviewed and discussed with patient certain preventive protocols, quality metrics, and best practice recommendations. A written personalized care plan for preventive services as well as general preventive health recommendations were provided to patient.     Kellie Simmering, LPN   04/30/5725   Nurse Notes:

## 2020-11-14 NOTE — Patient Instructions (Signed)
Charlene Juarez , Thank you for taking time to come for your Medicare Wellness Visit. I appreciate your ongoing commitment to your health goals. Please review the following plan we discussed and let me know if I can assist you in the future.   Screening recommendations/referrals: Colonoscopy: completed 09/27/2012 Mammogram: completed 08/01/2020 Bone Density: completed 05/23/2019 Recommended yearly ophthalmology/optometry visit for glaucoma screening and checkup Recommended yearly dental visit for hygiene and checkup  Vaccinations: Influenza vaccine: completed 08/08/2020, due 04/20/2021 Pneumococcal vaccine: completed 05/29/2019 Tdap vaccine: completed 01/29/2020, due 01/28/2030 Shingles vaccine: completed   Covid-19: decline  Advanced directives: Please bring a copy of your POA (Power of [place]) and/or Living Will to your next appointment.   Conditions/risks identified: none  Next appointment: Follow up in one year for your annual wellness visit    Preventive Care 65 Years and Older, Female Preventive care refers to lifestyle choices and visits with your health care provider that can promote health and wellness. What does preventive care include?  A yearly physical exam. This is also called an annual well check.  Dental exams once or twice a year.  Routine eye exams. Ask your health care provider how often you should have your eyes checked.  Personal lifestyle choices, including:  Daily care of your teeth and gums.  Regular physical activity.  Eating a healthy diet.  Avoiding tobacco and drug use.  Limiting alcohol use.  Practicing safe sex.  Taking low-dose aspirin every day.  Taking vitamin and mineral supplements as recommended by your health care provider. What happens during an annual well check? The services and screenings done by your health care provider during your annual well check will depend on your age, overall health, lifestyle risk factors, and family history of  disease. Counseling  Your health care provider may ask you questions about your:  Alcohol use.  Tobacco use.  Drug use.  Emotional well-being.  Home and relationship well-being.  Sexual activity.  Eating habits.  History of falls.  Memory and ability to understand (cognition).  Work and work Statistician.  Reproductive health. Screening  You may have the following tests or measurements:  Height, weight, and BMI.  Blood pressure.  Lipid and cholesterol levels. These may be checked every 5 years, or more frequently if you are over 84 years old.  Skin check.  Lung cancer screening. You may have this screening every year starting at age 72 if you have a 30-pack-year history of smoking and currently smoke or have quit within the past 15 years.  Fecal occult blood test (FOBT) of the stool. You may have this test every year starting at age 36.  Flexible sigmoidoscopy or colonoscopy. You may have a sigmoidoscopy every 5 years or a colonoscopy every 10 years starting at age 1.  Hepatitis C blood test.  Hepatitis B blood test.  Sexually transmitted disease (STD) testing.  Diabetes screening. This is done by checking your blood sugar (glucose) after you have not eaten for a while (fasting). You may have this done every 1-3 years.  Bone density scan. This is done to screen for osteoporosis. You may have this done starting at age 41.  Mammogram. This may be done every 1-2 years. Talk to your health care provider about how often you should have regular mammograms. Talk with your health care provider about your test results, treatment options, and if necessary, the need for more tests. Vaccines  Your health care provider may recommend certain vaccines, such as:  Influenza vaccine. This  is recommended every year.  Tetanus, diphtheria, and acellular pertussis (Tdap, Td) vaccine. You may need a Td booster every 10 years.  Zoster vaccine. You may need this after age  83.  Pneumococcal 13-valent conjugate (PCV13) vaccine. One dose is recommended after age 49.  Pneumococcal polysaccharide (PPSV23) vaccine. One dose is recommended after age 21. Talk to your health care provider about which screenings and vaccines you need and how often you need them. This information is not intended to replace advice given to you by your health care provider. Make sure you discuss any questions you have with your health care provider. Document Released: 10/03/2015 Document Revised: 05/26/2016 Document Reviewed: 07/08/2015 Elsevier Interactive Patient Education  2017 [place] Prevention in the Home Falls can cause injuries. They can happen to people of all ages. There are many things you can do to make your home safe and to help prevent falls. What can I do on the outside of my home?  Regularly fix the edges of walkways and driveways and fix any cracks.  Remove anything that might make you trip as you walk through a door, such as a raised step or threshold.  Trim any bushes or trees on the path to your home.  Use bright outdoor lighting.  Clear any walking paths of anything that might make someone trip, such as rocks or tools.  Regularly check to see if handrails are loose or broken. Make sure that both sides of any steps have handrails.  Any raised decks and porches should have guardrails on the edges.  Have any leaves, snow, or ice cleared regularly.  Use sand or salt on walking paths during winter.  Clean up any spills in your garage right away. This includes oil or grease spills. What can I do in the bathroom?  Use night lights.  Install grab bars by the toilet and in the tub and shower. Do not use towel bars as grab bars.  Use non-skid mats or decals in the tub or shower.  If you need to sit down in the shower, use a plastic, non-slip stool.  Keep the floor dry. Clean up any water that spills on the floor as soon as it happens.  Remove  soap buildup in the tub or shower regularly.  Attach bath mats securely with double-sided non-slip rug tape.  Do not have throw rugs and other things on the floor that can make you trip. What can I do in the bedroom?  Use night lights.  Make sure that you have a light by your bed that is easy to reach.  Do not use any sheets or blankets that are too big for your bed. They should not hang down onto the floor.  Have a firm chair that has side arms. You can use this for support while you get dressed.  Do not have throw rugs and other things on the floor that can make you trip. What can I do in the kitchen?  Clean up any spills right away.  Avoid walking on wet floors.  Keep items that you use a lot in easy-to-reach places.  If you need to reach something above you, use a strong step stool that has a grab bar.  Keep electrical cords out of the way.  Do not use floor polish or wax that makes floors slippery. If you must use wax, use non-skid floor wax.  Do not have throw rugs and other things on the floor that can make you  trip. What can I do with my stairs?  Do not leave any items on the stairs.  Make sure that there are handrails on both sides of the stairs and use them. Fix handrails that are broken or loose. Make sure that handrails are as long as the stairways.  Check any carpeting to make sure that it is firmly attached to the stairs. Fix any carpet that is loose or worn.  Avoid having throw rugs at the top or bottom of the stairs. If you do have throw rugs, attach them to the floor with carpet tape.  Make sure that you have a light switch at the top of the stairs and the bottom of the stairs. If you do not have them, ask someone to add them for you. What else can I do to help prevent falls?  Wear shoes that:  Do not have high heels.  Have rubber bottoms.  Are comfortable and fit you well.  Are closed at the toe. Do not wear sandals.  If you use a  stepladder:  Make sure that it is fully opened. Do not climb a closed stepladder.  Make sure that both sides of the stepladder are locked into place.  Ask someone to hold it for you, if possible.  Clearly mark and make sure that you can see:  Any grab bars or handrails.  First and last steps.  Where the edge of each step is.  Use tools that help you move around (mobility aids) if they are needed. These include:  Canes.  Walkers.  Scooters.  Crutches.  Turn on the lights when you go into a dark area. Replace any light bulbs as soon as they burn out.  Set up your furniture so you have a clear path. Avoid moving your furniture around.  If any of your floors are uneven, fix them.  If there are any pets around you, be aware of where they are.  Review your medicines with your doctor. Some medicines can make you feel dizzy. This can increase your chance of falling. Ask your doctor what other things that you can do to help prevent falls. This information is not intended to replace advice given to you by your health care provider. Make sure you discuss any questions you have with your health care provider. Document Released: 07/03/2009 Document Revised: 02/12/2016 Document Reviewed: 10/11/2014 Elsevier Interactive Patient Education  2017 [employer].

## 2020-11-15 ENCOUNTER — Encounter: Payer: Self-pay | Admitting: Nurse Practitioner

## 2020-11-17 ENCOUNTER — Other Ambulatory Visit: Payer: Self-pay

## 2020-11-17 ENCOUNTER — Encounter: Payer: Self-pay | Admitting: Nurse Practitioner

## 2020-11-17 ENCOUNTER — Ambulatory Visit (INDEPENDENT_AMBULATORY_CARE_PROVIDER_SITE_OTHER): Payer: Medicare Other | Admitting: Nurse Practitioner

## 2020-11-17 VITALS — BP 120/73 | HR 65 | Temp 97.6°F | Wt 172.4 lb

## 2020-11-17 DIAGNOSIS — E669 Obesity, unspecified: Secondary | ICD-10-CM

## 2020-11-17 DIAGNOSIS — E785 Hyperlipidemia, unspecified: Secondary | ICD-10-CM | POA: Diagnosis not present

## 2020-11-17 DIAGNOSIS — E538 Deficiency of other specified B group vitamins: Secondary | ICD-10-CM

## 2020-11-17 DIAGNOSIS — Z6831 Body mass index (BMI) 31.0-31.9, adult: Secondary | ICD-10-CM

## 2020-11-17 DIAGNOSIS — M85851 Other specified disorders of bone density and structure, right thigh: Secondary | ICD-10-CM

## 2020-11-17 DIAGNOSIS — E559 Vitamin D deficiency, unspecified: Secondary | ICD-10-CM

## 2020-11-17 DIAGNOSIS — E1169 Type 2 diabetes mellitus with other specified complication: Secondary | ICD-10-CM | POA: Diagnosis not present

## 2020-11-17 DIAGNOSIS — I152 Hypertension secondary to endocrine disorders: Secondary | ICD-10-CM | POA: Diagnosis not present

## 2020-11-17 DIAGNOSIS — E1159 Type 2 diabetes mellitus with other circulatory complications: Secondary | ICD-10-CM | POA: Diagnosis not present

## 2020-11-17 DIAGNOSIS — E6609 Other obesity due to excess calories: Secondary | ICD-10-CM

## 2020-11-17 LAB — MICROALBUMIN, URINE WAIVED
Creatinine, Urine Waived: 100 mg/dL (ref 10–300)
Microalb, Ur Waived: 10 mg/L (ref 0–19)
Microalb/Creat Ratio: <30 mg/g (ref <30)

## 2020-11-17 LAB — BAYER DCA HB A1C WAIVED: HB A1C (BAYER DCA - WAIVED): 7.4 % — ABNORMAL HIGH (ref <7.0)

## 2020-11-17 MED ORDER — ROSUVASTATIN CALCIUM 20 MG PO TABS: 20.0000 mg | ORAL_TABLET | Freq: Every day | ORAL | 3 refills | Status: DC

## 2020-11-17 NOTE — Assessment & Plan Note (Signed)
Chronic, stable with A1C 7.4%, slight trend upwards -- she will monitor diet closely as has had some indiscretions. Urine ALB obtained today.  Continue current medication regimen, including Jardiance, Metformin, and Ozempic.  To notify provider if sugars consistently read > 130, continue to monitor these at home closely.  Recommend return to diet and exercise focus.  Return in 3 months.

## 2020-11-17 NOTE — Progress Notes (Signed)
BP 120/73   Pulse 65   Temp 97.6 F (36.4 C) (Oral)   Wt 172 lb 6.4 oz (78.2 kg)   LMP 07/13/2000 (Approximate)   SpO2 97%   BMI 31.53 kg/m    Subjective:    Patient ID: Charlene Juarez, female    DOB: 05/16/1951, 70 y.o.   MRN: [id number]  HPI: Rashunda Juarez is a 70 y.o. female  Chief Complaint  Patient presents with  . Diabetes    Pt denies having any problems or concerns at this moment.   DIABETES Continues on Metformin, Jardiance, and Ozempic. Recent A1C was 6.5% last visit.  History of low B12 levels, which have stabilized with supplement daily.  Reports her diet has been off the past few months, homemade cookies. Hypoglycemic episodes:no Polydipsia/polyuria: no Visual disturbance: no Chest pain: no Paresthesias: no Glucose Monitoring: yes  Accucheck frequency: occasionally  Fasting glucose: 190 yesterday -- on average 140  Post prandial:  Evening:  Before meals: Taking Insulin?: no  Long acting insulin:  Short acting insulin: Blood Pressure Monitoring: not checking Retinal Examination: Not Up To Date -- Medicare does not cover Foot Exam: Up to Date Pneumovax: Up to Date Influenza: Up to Date Aspirin: no   HYPERTENSION / HYPERLIPIDEMIA Continues on Micardis, HCTZ, and Simvastatin.  She reports side effects with Simvastatin -- reports when taking this has chest discomfort at times, when she quit taking did not notice this.  Has not received CPAP yet, as supplies were short. Satisfied with current treatment? yes Duration of hypertension: chronic BP monitoring frequency: not checking BP range: none BP medication side effects: no Duration of hyperlipidemia: chronic Cholesterol medication side effects: no Cholesterol supplements: none Medication compliance: good compliance Aspirin: no Recent stressors: no Recurrent headaches: no Visual changes: no Palpitations: no Dyspnea: no Chest pain: no Lower extremity edema:  no Dizzy/lightheaded: no  The 10-year ASCVD risk score Mikey Bussing DC Jr., et al., 2013) is: 16.9%   Values used to calculate the score:     Age: 72 years     Sex: Female     Is Non-Hispanic African American: No     Diabetic: Yes     Tobacco smoker: No     Systolic Blood Pressure: 812 mmHg     Is BP treated: Yes     HDL Cholesterol: 52 mg/dL     Total Cholesterol: 145 mg/dL   OSTEOPENIA Noted on DEXA on 05/23/2019 with Vitamin D level stable and no recent falls or fractures. Satisfied with current treatment?: yes Adequate calcium & vitamin D: yes Weight bearing exercises: yes   Relevant past medical, surgical, family and social history reviewed and updated as indicated. Interim medical history since our last visit reviewed. Allergies and medications reviewed and updated.  Review of Systems  Constitutional: Negative for activity change, appetite change, diaphoresis, fatigue and fever.  Respiratory: Negative for cough, chest tightness, shortness of breath and wheezing.   Cardiovascular: Negative for chest pain, palpitations and leg swelling.  Gastrointestinal: Negative.   Endocrine: Negative for cold intolerance, heat intolerance, polydipsia, polyphagia and polyuria.  Neurological: Negative.   Psychiatric/Behavioral: Negative.     Per HPI unless specifically indicated above     Objective:    BP 120/73   Pulse 65   Temp 97.6 F (36.4 C) (Oral)   Wt 172 lb 6.4 oz (78.2 kg)   LMP 07/13/2000 (Approximate)   SpO2 97%   BMI 31.53 kg/m   Wt Readings from Last 3 Encounters:  11/17/20 172 lb 6.4 oz (78.2 kg)  11/14/20 170 lb (77.1 kg)  08/08/20 171 lb (77.6 kg)    Physical Exam Vitals and nursing note reviewed.  Constitutional:      General: She is awake. She is not in acute distress.    Appearance: She is well-developed. She is not ill-appearing.  HENT:     Head: Normocephalic.     Right Ear: Hearing normal.     Left Ear: Hearing normal.     Nose: Nose normal.      Mouth/Throat:     Mouth: Mucous membranes are moist.  Eyes:     General: Lids are normal.        Right eye: No discharge.        Left eye: No discharge.     Conjunctiva/sclera: Conjunctivae normal.     Pupils: Pupils are equal, round, and reactive to light.  Neck:     Thyroid: No thyromegaly.     Vascular: No carotid bruit.  Cardiovascular:     Rate and Rhythm: Normal rate and regular rhythm.     Heart sounds: Normal heart sounds. No murmur heard. No gallop.   Pulmonary:     Effort: Pulmonary effort is normal. No accessory muscle usage or respiratory distress.     Breath sounds: Normal breath sounds.  Abdominal:     General: Bowel sounds are normal.     Palpations: Abdomen is soft.     Tenderness: There is no abdominal tenderness.  Musculoskeletal:     Cervical back: Normal range of motion and neck supple.     Right lower leg: No edema.     Left lower leg: No edema.  Lymphadenopathy:     Cervical: No cervical adenopathy.  Skin:    General: Skin is warm and dry.  Neurological:     Mental Status: She is alert and oriented to person, place, and time.  Psychiatric:        Attention and Perception: Attention normal.        Mood and Affect: Mood normal.        Speech: Speech normal.        Behavior: Behavior normal. Behavior is cooperative.        Thought Content: Thought content normal.        Judgment: Judgment normal.    Diabetic Foot Exam - Simple   Simple Foot Form Visual Inspection No deformities, no ulcerations, no other skin breakdown bilaterally: Yes Sensation Testing Intact to touch and monofilament testing bilaterally: Yes Pulse Check Posterior Tibialis and Dorsalis pulse intact bilaterally: Yes Comments    Results for orders placed or performed in visit on 08/08/20  Lipid Panel w/o Chol/HDL Ratio  Result Value Ref Range   Cholesterol, Total 145 100 - 199 mg/dL   Triglycerides 172 (H) 0 - 149 mg/dL   HDL 52 >39 mg/dL   VLDL Cholesterol Cal 29 5 - 40  mg/dL   LDL Chol Calc (NIH) 64 0 - 99 mg/dL  Bayer DCA Hb A1c Waived  Result Value Ref Range   HB A1C (BAYER DCA - WAIVED) 6.5 <7.0 %      Assessment & Plan:   Problem List Items Addressed This Visit      Cardiovascular and Mediastinum   Hypertension associated with diabetes ([place])    Chronic, stable with BP at goal in office and at home.  Continue current medication regimen, Micardis for kidney protection, and adjust as needed.  CMP today.  Recommend she continue to check BP at home on occasion and document for visits.  Focus on DASH diet.  Return in 3 months.      Relevant Medications   rosuvastatin (CRESTOR) 20 MG tablet   Other Relevant Orders   Bayer DCA Hb A1c Waived   Comprehensive metabolic panel   Microalbumin, Urine Waived     Endocrine   Type 2 diabetes mellitus with obesity (HCC) - Primary    Chronic, stable with A1C 7.4%, slight trend upwards -- she will monitor diet closely as has had some indiscretions. Urine ALB obtained today.  Continue current medication regimen, including Jardiance, Metformin, and Ozempic.  To notify provider if sugars consistently read > 130, continue to monitor these at home closely.  Recommend return to diet and exercise focus.  Return in 3 months.      Relevant Medications   rosuvastatin (CRESTOR) 20 MG tablet   Other Relevant Orders   Bayer DCA Hb A1c Waived   Comprehensive metabolic panel   Microalbumin, Urine Waived   Hyperlipidemia associated with type 2 diabetes mellitus (HCC)    Chronic, ongoing.  Will change statin to Rosuvastatin since having side effects with Simvastatin -- discussed with patient and educated, she would like to change.  Lipid panel today.      Relevant Medications   rosuvastatin (CRESTOR) 20 MG tablet   Other Relevant Orders   Bayer DCA Hb A1c Waived   Comprehensive metabolic panel   Lipid Panel w/o Chol/HDL Ratio     Musculoskeletal and Integument   Osteopenia of neck of right femur    Noted on Dexa in  2020.  Repeat Dexa 2025.  Continue daily Vit D and calcium. Check levels today.      Relevant Orders   VITAMIN D 25 Hydroxy (Vit-D Deficiency, Fractures)     Other   Obesity    BMI 31.53.  Recommended eating smaller high protein, low fat meals more frequently and exercising 30 mins a day 5 times a week with a goal of 10-15lb weight loss in the next 3 months. Patient voiced their understanding and motivation to adhere to these recommendations.       Vitamin D deficiency    Continue current supplement and plan for DEXA scan repeat in 2025 due to osteopenia..  Check Vit D today.      Relevant Orders   VITAMIN D 25 Hydroxy (Vit-D Deficiency, Fractures)   B12 deficiency    Recheck B12 level today.  Restart supplement as needed.      Relevant Orders   Vitamin B12       Follow up plan: Return in about 3 months (around 02/14/2021) for T2DM, HTN/HLD, GERD -- last A1c 7.4%.

## 2020-11-17 NOTE — Patient Instructions (Signed)
Diabetes Mellitus and Nutrition, Adult When you have diabetes, or diabetes mellitus, it is very important to have healthy eating habits because your blood sugar (glucose) levels are greatly affected by what you eat and drink. Eating healthy foods in the right amounts, at about the same times every day, can help you:  Control your blood glucose.  Lower your risk of heart disease.  Improve your blood pressure.  Reach or maintain a healthy weight. What can affect my meal plan? Every person with diabetes is different, and each person has different needs for a meal plan. Your health care provider may recommend that you work with a dietitian to make a meal plan that is best for you. Your meal plan may vary depending on factors such as:  The calories you need.  The medicines you take.  Your weight.  Your blood glucose, blood pressure, and cholesterol levels.  Your activity level.  Other health conditions you have, such as heart or kidney disease. How do carbohydrates affect me? Carbohydrates, also called carbs, affect your blood glucose level more than any other type of food. Eating carbs naturally raises the amount of glucose in your blood. Carb counting is a method for keeping track of how many carbs you eat. Counting carbs is important to keep your blood glucose at a healthy level, especially if you use insulin or take certain oral diabetes medicines. It is important to know how many carbs you can safely have in each meal. This is different for every person. Your dietitian can help you calculate how many carbs you should have at each meal and for each snack. How does alcohol affect me? Alcohol can cause a sudden decrease in blood glucose (hypoglycemia), especially if you use insulin or take certain oral diabetes medicines. Hypoglycemia can be a life-threatening condition. Symptoms of hypoglycemia, such as sleepiness, dizziness, and confusion, are similar to symptoms of having too much  alcohol.  Do not drink alcohol if: ? Your health care provider tells you not to drink. ? You are pregnant, may be pregnant, or are planning to become pregnant.  If you drink alcohol: ? Do not drink on an empty stomach. ? Limit how much you use to:  0-1 drink a day for women.  0-2 drinks a day for men. ? Be aware of how much alcohol is in your drink. In the [place] one drink equals one 12 oz bottle of beer (355 mL), one 5 oz glass of wine (148 mL), or one 1 oz glass of hard liquor (44 mL). ? Keep yourself hydrated with water, diet soda, or unsweetened iced tea.  Keep in mind that regular soda, juice, and other mixers may contain a lot of sugar and must be counted as carbs. What are tips for following this plan? Reading food labels  Start by checking the serving size on the "Nutrition Facts" label of packaged foods and drinks. The amount of calories, carbs, fats, and other nutrients listed on the label is based on one serving of the item. Many items contain more than one serving per package.  Check the total grams (g) of carbs in one serving. You can calculate the number of servings of carbs in one serving by dividing the total carbs by 15. For example, if a food has 30 g of total carbs per serving, it would be equal to 2 servings of carbs.  Check the number of grams (g) of saturated fats and trans fats in one serving. Choose foods that have   a low amount or none of these fats.  Check the number of milligrams (mg) of salt (sodium) in one serving. Most people should limit total sodium intake to less than 2,300 mg per day.  Always check the nutrition information of foods labeled as "low-fat" or "nonfat." These foods may be higher in added sugar or refined carbs and should be avoided.  Talk to your dietitian to identify your daily goals for nutrients listed on the label. Shopping  Avoid buying canned, pre-made, or processed foods. These foods tend to be high in fat, sodium, and added  sugar.  Shop around the outside edge of the grocery store. This is where you will most often find fresh fruits and vegetables, bulk grains, fresh meats, and fresh dairy. Cooking  Use low-heat cooking methods, such as baking, instead of high-heat cooking methods like deep frying.  Cook using healthy oils, such as olive, canola, or sunflower oil.  Avoid cooking with butter, cream, or high-fat meats. Meal planning  Eat meals and snacks regularly, preferably at the same times every day. Avoid going long periods of time without eating.  Eat foods that are high in fiber, such as fresh fruits, vegetables, beans, and whole grains. Talk with your dietitian about how many servings of carbs you can eat at each meal.  Eat 4-6 oz (112-168 g) of lean protein each day, such as lean meat, chicken, fish, eggs, or tofu. One ounce (oz) of lean protein is equal to: ? 1 oz (28 g) of meat, chicken, or fish. ? 1 egg. ?  cup (62 g) of tofu.  Eat some foods each day that contain healthy fats, such as avocado, nuts, seeds, and fish.   What foods should I eat? Fruits Berries. Apples. Oranges. Peaches. Apricots. Plums. Grapes. Mango. Papaya. Pomegranate. Kiwi. Cherries. Vegetables Lettuce. Spinach. Leafy greens, including kale, chard, collard greens, and mustard greens. Beets. Cauliflower. Cabbage. Broccoli. Carrots. Green beans. Tomatoes. Peppers. Onions. Cucumbers. Brussels sprouts. Grains Whole grains, such as whole-wheat or whole-grain bread, crackers, tortillas, cereal, and pasta. Unsweetened oatmeal. Quinoa. Brown or wild rice. Meats and other proteins Seafood. Poultry without skin. Lean cuts of poultry and beef. Tofu. Nuts. Seeds. Dairy Low-fat or fat-free dairy products such as milk, yogurt, and cheese. The items listed above may not be a complete list of foods and beverages you can eat. Contact a dietitian for more information. What foods should I avoid? Fruits Fruits canned with  syrup. Vegetables Canned vegetables. Frozen vegetables with butter or cream sauce. Grains Refined white flour and flour products such as bread, pasta, snack foods, and cereals. Avoid all processed foods. Meats and other proteins Fatty cuts of meat. Poultry with skin. Breaded or fried meats. Processed meat. Avoid saturated fats. Dairy Full-fat yogurt, cheese, or milk. Beverages Sweetened drinks, such as soda or iced tea. The items listed above may not be a complete list of foods and beverages you should avoid. Contact a dietitian for more information. Questions to ask a health care provider  Do I need to meet with a diabetes educator?  Do I need to meet with a dietitian?  What number can I call if I have questions?  When are the best times to check my blood glucose? Where to find more information:  American Diabetes Association: diabetes.org  Academy of Nutrition and Dietetics: www.eatright.org  National Institute of Diabetes and Digestive and Kidney Diseases: www.niddk.nih.gov  Association of Diabetes Care and Education Specialists: www.diabeteseducator.org Summary  It is important to have healthy eating   habits because your blood sugar (glucose) levels are greatly affected by what you eat and drink.  A healthy meal plan will help you control your blood glucose and maintain a healthy lifestyle.  Your health care provider may recommend that you work with a dietitian to make a meal plan that is best for you.  Keep in mind that carbohydrates (carbs) and alcohol have immediate effects on your blood glucose levels. It is important to count carbs and to use alcohol carefully. This information is not intended to replace advice given to you by your health care provider. Make sure you discuss any questions you have with your health care provider. Document Revised: 08/14/2019 Document Reviewed: 08/14/2019 Elsevier Patient Education  2021 Elsevier Inc.  

## 2020-11-17 NOTE — Assessment & Plan Note (Signed)
Chronic, stable with BP at goal in office and at home.  Continue current medication regimen, Micardis for kidney protection, and adjust as needed.  CMP today.  Recommend she continue to check BP at home on occasion and document for visits.  Focus on DASH diet.  Return in 3 months.

## 2020-11-17 NOTE — Assessment & Plan Note (Signed)
Continue current supplement and plan for DEXA scan repeat in 2025 due to osteopenia..  Check Vit D today. 

## 2020-11-17 NOTE — Assessment & Plan Note (Signed)
BMI 31.53.  Recommended eating smaller high protein, low fat meals more frequently and exercising 30 mins a day 5 times a week with a goal of 10-15lb weight loss in the next 3 months. Patient voiced their understanding and motivation to adhere to these recommendations.

## 2020-11-17 NOTE — Assessment & Plan Note (Addendum)
Chronic, ongoing.  Will change statin to Rosuvastatin since having side effects with Simvastatin -- discussed with patient and educated, she would like to change.  Lipid panel today.

## 2020-11-17 NOTE — Assessment & Plan Note (Signed)
Noted on Dexa in 2020.  Repeat Dexa 2025.  Continue daily Vit D and calcium. Check levels today. ?

## 2020-11-17 NOTE — Assessment & Plan Note (Signed)
Recheck B12 level today.  Restart supplement as needed.

## 2020-11-18 LAB — COMPREHENSIVE METABOLIC PANEL
ALT: 34 IU/L — ABNORMAL HIGH (ref 0–32)
AST: 25 IU/L (ref 0–40)
Albumin/Globulin Ratio: 1.9 (ref 1.2–2.2)
Albumin: 4.4 g/dL (ref 3.8–4.8)
Alkaline Phosphatase: 64 IU/L (ref 44–121)
BUN/Creatinine Ratio: 22 (ref 12–28)
BUN: 15 mg/dL (ref 8–27)
Bilirubin Total: 0.4 mg/dL (ref 0.0–1.2)
CO2: 25 mmol/L (ref 20–29)
Calcium: 9.3 mg/dL (ref 8.7–10.3)
Chloride: 99 mmol/L (ref 96–106)
Creatinine, Ser: 0.67 mg/dL (ref 0.57–1.00)
Globulin, Total: 2.3 g/dL (ref 1.5–4.5)
Glucose: 134 mg/dL — ABNORMAL HIGH (ref 65–99)
Potassium: 4.1 mmol/L (ref 3.5–5.2)
Sodium: 137 mmol/L (ref 134–144)
Total Protein: 6.7 g/dL (ref 6.0–8.5)
eGFR: 95 mL/min/{1.73_m2} (ref >59)

## 2020-11-18 LAB — LIPID PANEL W/O CHOL/HDL RATIO
Cholesterol, Total: 196 mg/dL (ref 100–199)
HDL: 48 mg/dL (ref >39)
LDL Chol Calc (NIH): 112 mg/dL — ABNORMAL HIGH (ref 0–99)
Triglycerides: 205 mg/dL — ABNORMAL HIGH (ref 0–149)
VLDL Cholesterol Cal: 36 mg/dL (ref 5–40)

## 2020-11-18 LAB — VITAMIN B12: Vitamin B-12: 550 pg/mL (ref 232–1245)

## 2020-11-18 LAB — VITAMIN D 25 HYDROXY (VIT D DEFICIENCY, FRACTURES): Vit D, 25-Hydroxy: 39.4 ng/mL (ref 30.0–100.0)

## 2020-11-18 NOTE — Progress Notes (Signed)
Contacted via [place] afternoon Marycatherine, your labs have returned.  Kidney and liver function remain stable, very mild elevation in ALT (one of the liver tests) but we will continue to monitor this.  Cholesterol levels are a bit elevated this check, were you fasting?  Are you taking your Rosuvastatin daily?  Please ensure you do take this every day to help with stroke prevention in diabetes.  Vitamin D is normal and B12 is stable, may reduce your B12 supplement to every other day or a few days a week.  Any questions? Keep being awesome!!  Thank you for allowing me to participate in your care. Kindest regards, Amiri Tritch

## 2020-11-19 ENCOUNTER — Other Ambulatory Visit: Payer: Self-pay | Admitting: Nurse Practitioner

## 2020-11-19 NOTE — Telephone Encounter (Signed)
Requested medications are due for refill today yes  Requested medications are on the active medication list yes  Last refill yes  Last visit 11/17/20 , this med/dx not addressed, do not see it addressed in more than 12 months.  Future visit scheduled 01/2021  Notes to clinic Please assess if med/dx addressed. If not, failed protocol of valid visit within 6 months.

## 2020-11-25 ENCOUNTER — Other Ambulatory Visit: Payer: Self-pay | Admitting: Nurse Practitioner

## 2020-11-25 NOTE — Telephone Encounter (Signed)
Requested Prescriptions  Pending Prescriptions Disp Refills  . JARDIANCE 25 MG TABS tablet [Pharmacy Med Name: JARDIANCE 25 MG TABLET] 90 tablet 0    Sig: TAKE 1 TABLET BY MOUTH EVERY DAY     Endocrinology:  Diabetes - SGLT2 Inhibitors Failed - 11/25/2020 10:11 AM      Failed - LDL in normal range and within 360 days    LDL Chol Calc (NIH)  Date Value Ref Range Status  11/17/2020 112 (H) 0 - 99 mg/dL Final         Passed - Cr in normal range and within 360 days    Creatinine, Ser  Date Value Ref Range Status  11/17/2020 0.67 0.57 - 1.00 mg/dL Final         Passed - HBA1C is between 0 and 7.9 and within 180 days    HB A1C (BAYER DCA - WAIVED)  Date Value Ref Range Status  11/17/2020 7.4 (H) <7.0 % Final    Comment:                                          Diabetic Adult            <7.0                                       Healthy Adult        4.3 - 5.7                                                           (DCCT/NGSP) American Diabetes Association's Summary of Glycemic Recommendations for Adults with Diabetes: Hemoglobin A1c <7.0%. More stringent glycemic goals (A1c <6.0%) may further reduce complications at the cost of increased risk of hypoglycemia.          Passed - eGFR in normal range and within 360 days    GFR calc Af Amer  Date Value Ref Range Status  05/08/2020 95 >59 mL/min/1.73 Final    Comment:    **[facility] currently reports eGFR in compliance with the current**   recommendations of the [employer]. [facility] will   update reporting as new guidelines are published from the NKF-ASN   Task force.    GFR calc non Af Amer  Date Value Ref Range Status  05/08/2020 82 >59 mL/min/1.73 Final         Passed - Valid encounter within last 6 months    Recent Outpatient Visits          1 week ago Type 2 diabetes mellitus with obesity ([place])   [place], Jolene T, NP   3 months ago Type 2 diabetes mellitus with obesity ([place])    [place], Jolene T, NP   6 months ago Type 2 diabetes mellitus with obesity ([place])   [place], Jolene T, NP   10 months ago Type 2 diabetes mellitus without complication, without long-term current use of insulin ([place])   [place], [place] T, NP   1 year ago Type 2 diabetes mellitus without complication, without long-term current  use of insulin ([place])   [place], Wilmore T, NP      Future Appointments            In 2 months Cannady, Barbaraann Faster, NP [facility]   In 11 months  [facility]           . telmisartan (MICARDIS) 80 MG tablet [Pharmacy Med Name: TELMISARTAN 80 MG TABLET] 90 tablet 1    Sig: TAKE 1 TABLET BY MOUTH EVERY DAY     Cardiovascular:  Angiotensin Receptor Blockers Passed - 11/25/2020 10:11 AM      Passed - Cr in normal range and within 180 days    Creatinine, Ser  Date Value Ref Range Status  11/17/2020 0.67 0.57 - 1.00 mg/dL Final         Passed - K in normal range and within 180 days    Potassium  Date Value Ref Range Status  11/17/2020 4.1 3.5 - 5.2 mmol/L Final         Passed - Patient is not pregnant      Passed - Last BP in normal range    BP Readings from Last 1 Encounters:  11/17/20 120/73         Passed - Valid encounter within last 6 months    Recent Outpatient Visits          1 week ago Type 2 diabetes mellitus with obesity ([place])   [place], Jolene T, NP   3 months ago Type 2 diabetes mellitus with obesity ([place])   [place], Jolene T, NP   6 months ago Type 2 diabetes mellitus with obesity ([place])   [place], Jolene T, NP   10 months ago Type 2 diabetes mellitus without complication, without long-term current use of insulin ([place])   [place], [place] T, NP   1 year ago Type 2 diabetes mellitus without complication, without  long-term current use of insulin ([place])   [place], [place]la T, NP      Future Appointments            In 2 months Cannady, Barbaraann Faster, NP [facility]   In 11 months  [facility]           . hydrochlorothiazide (HYDRODIURIL) 25 MG tablet [Pharmacy Med Name: HYDROCHLOROTHIAZIDE 25 MG TAB] 90 tablet 1    Sig: TAKE 1 TABLET BY MOUTH EVERY DAY     Cardiovascular: Diuretics - Thiazide Passed - 11/25/2020 10:11 AM      Passed - Ca in normal range and within 360 days    Calcium  Date Value Ref Range Status  11/17/2020 9.3 8.7 - 10.3 mg/dL Final         Passed - Cr in normal range and within 360 days    Creatinine, Ser  Date Value Ref Range Status  11/17/2020 0.67 0.57 - 1.00 mg/dL Final         Passed - K in normal range and within 360 days    Potassium  Date Value Ref Range Status  11/17/2020 4.1 3.5 - 5.2 mmol/L Final         Passed - Na in normal range and within 360 days    Sodium  Date Value Ref Range Status  11/17/2020 137 134 - 144 mmol/L Final         Passed - Last  BP in normal range    BP Readings from Last 1 Encounters:  11/17/20 120/73         Passed - Valid encounter within last 6 months    Recent Outpatient Visits          1 week ago Type 2 diabetes mellitus with obesity (HCC)   [facility] [place], [place] T, NP   3 months ago Type 2 diabetes mellitus with obesity (HCC)   [facility] [place], Jolene T, NP   6 months ago Type 2 diabetes mellitus with obesity (HCC)   [facility] Cannady, Jolene T, NP   10 months ago Type 2 diabetes mellitus without complication, without long-term current use of insulin (HCC)   [facility] [place], [place] T, NP   1 year ago Type 2 diabetes mellitus without complication, without long-term current use of insulin (HCC)   [facility] [place], Dorie Rank, NP      Future Appointments            In 2 months  Cannady, Crane T, NP [facility]   In 11 months  [facility]           . metFORMIN (GLUCOPHAGE) 1000 MG tablet [Pharmacy Med Name: METFORMIN HCL 1,000 MG TABLET] 180 tablet 1    Sig: TAKE 1 TABLET (1,000 MG TOTAL) BY MOUTH 2 (TWO) TIMES DAILY WITH A MEAL.     Endocrinology:  Diabetes - Biguanides Passed - 11/25/2020 10:11 AM      Passed - Cr in normal range and within 360 days    Creatinine, Ser  Date Value Ref Range Status  11/17/2020 0.67 0.57 - 1.00 mg/dL Final         Passed - HBA1C is between 0 and 7.9 and within 180 days    HB A1C (BAYER DCA - WAIVED)  Date Value Ref Range Status  11/17/2020 7.4 (H) <7.0 % Final    Comment:                                          Diabetic Adult            <7.0                                       Healthy Adult        4.3 - 5.7                                                           (DCCT/NGSP) American Diabetes Association's Summary of Glycemic Recommendations for Adults with Diabetes: Hemoglobin A1c <7.0%. More stringent glycemic goals (A1c <6.0%) may further reduce complications at the cost of increased risk of hypoglycemia.          Passed - eGFR in normal range and within 360 days    GFR calc Af Amer  Date Value Ref Range Status  05/08/2020 95 >59 mL/min/1.73 Final    Comment:    **[facility] currently reports eGFR in compliance with the current**   recommendations of the [employer].  [facility] will   update reporting as new guidelines are published from the NKF-ASN   Task force.    GFR calc non Af Amer  Date Value Ref Range Status  05/08/2020 82 >59 mL/min/1.73 Final         Passed - Valid encounter within last 6 months    Recent Outpatient Visits          1 week ago Type 2 diabetes mellitus with obesity ([place])   [place], Jolene T, NP   3 months ago Type 2 diabetes mellitus with obesity ([place])   [place], Jolene T, NP   6  months ago Type 2 diabetes mellitus with obesity ([place])   [place], Jolene T, NP   10 months ago Type 2 diabetes mellitus without complication, without long-term current use of insulin ([place])   [place], [place] T, NP   1 year ago Type 2 diabetes mellitus without complication, without long-term current use of insulin ([place])   [place], Barbaraann Faster, NP      Future Appointments            In 2 months Cannady, Barbaraann Faster, NP [facility]   In 20 months  [facility]

## 2020-12-30 ENCOUNTER — Telehealth: Payer: Self-pay | Admitting: Nurse Practitioner

## 2020-12-30 NOTE — Telephone Encounter (Signed)
Pt called in to change her pharmacy back to the [id number] - [place], [place] - [address]   Pt says that she initially changed because her pharmacy didn't have her Rx in stock.   Updated pt's pharmacy in chart.

## 2021-01-04 ENCOUNTER — Other Ambulatory Visit: Payer: Self-pay | Admitting: Nurse Practitioner

## 2021-01-04 NOTE — Telephone Encounter (Signed)
Requested Prescriptions  Pending Prescriptions Disp Refills  . telmisartan (MICARDIS) 80 MG tablet [Pharmacy Med Name: TELMISARTAN 80 MG TABLET] 90 tablet     Sig: TAKE 1 TABLET BY MOUTH EVERY DAY     Cardiovascular:  Angiotensin Receptor Blockers Passed - 01/04/2021  4:52 PM      Passed - Cr in normal range and within 180 days    Creatinine, Ser  Date Value Ref Range Status  11/17/2020 0.67 0.57 - 1.00 mg/dL Final         Passed - K in normal range and within 180 days    Potassium  Date Value Ref Range Status  11/17/2020 4.1 3.5 - 5.2 mmol/L Final         Passed - Patient is not pregnant      Passed - Last BP in normal range    BP Readings from Last 1 Encounters:  11/17/20 120/73         Passed - Valid encounter within last 6 months    Recent Outpatient Visits          1 month ago Type 2 diabetes mellitus with obesity ([place])   [place], Jolene T, NP   4 months ago Type 2 diabetes mellitus with obesity ([place])   [place], Jolene T, NP   8 months ago Type 2 diabetes mellitus with obesity ([place])   [place], Jolene T, NP   11 months ago Type 2 diabetes mellitus without complication, without long-term current use of insulin ([place])   [place], [place] T, NP   1 year ago Type 2 diabetes mellitus without complication, without long-term current use of insulin ([place])   [place], Barbaraann Faster, NP      Future Appointments            In 1 month Cannady, Barbaraann Faster, NP [facility]   In 10 months  [facility]           . rosuvastatin (CRESTOR) 20 MG tablet [Pharmacy Med Name: ROSUVASTATIN CALCIUM 20 MG TAB] 90 tablet     Sig: TAKE 1 TABLET BY MOUTH EVERY DAY     Cardiovascular:  Antilipid - Statins Failed - 01/04/2021  4:52 PM      Failed - LDL in normal range and within 360 days    LDL Chol Calc (NIH)  Date Value Ref Range Status   11/17/2020 112 (H) 0 - 99 mg/dL Final         Failed - Triglycerides in normal range and within 360 days    Triglycerides  Date Value Ref Range Status  11/17/2020 205 (H) 0 - 149 mg/dL Final         Passed - Total Cholesterol in normal range and within 360 days    Cholesterol, Total  Date Value Ref Range Status  11/17/2020 196 100 - 199 mg/dL Final         Passed - HDL in normal range and within 360 days    HDL  Date Value Ref Range Status  11/17/2020 48 >39 mg/dL Final         Passed - Patient is not pregnant      Passed - Valid encounter within last 12 months    Recent Outpatient Visits          1 month ago Type 2 diabetes mellitus with obesity ([place])   [place]  Marnee Guarneri T, NP   4 months ago Type 2 diabetes mellitus with obesity ([place])   [place], [place] T, NP   8 months ago Type 2 diabetes mellitus with obesity ([place])   [place], Jolene T, NP   11 months ago Type 2 diabetes mellitus without complication, without long-term current use of insulin ([place])   [place], [place] T, NP   1 year ago Type 2 diabetes mellitus without complication, without long-term current use of insulin ([place])   [place], Barbaraann Faster, NP      Future Appointments            In 1 month Cannady, Barbaraann Faster, NP [facility]   In 10 months  [facility]           . JARDIANCE 25 MG TABS tablet [Pharmacy Med Name: JARDIANCE 25 MG TABLET] 90 tablet     Sig: TAKE 1 TABLET BY MOUTH EVERY DAY     Endocrinology:  Diabetes - SGLT2 Inhibitors Failed - 01/04/2021  4:52 PM      Failed - LDL in normal range and within 360 days    LDL Chol Calc (NIH)  Date Value Ref Range Status  11/17/2020 112 (H) 0 - 99 mg/dL Final         Passed - Cr in normal range and within 360 days    Creatinine, Ser  Date Value Ref Range Status  11/17/2020 0.67 0.57 - 1.00 mg/dL  Final         Passed - HBA1C is between 0 and 7.9 and within 180 days    HB A1C (BAYER DCA - WAIVED)  Date Value Ref Range Status  11/17/2020 7.4 (H) <7.0 % Final    Comment:                                          Diabetic Adult            <7.0                                       Healthy Adult        4.3 - 5.7                                                           (DCCT/NGSP) American Diabetes Association's Summary of Glycemic Recommendations for Adults with Diabetes: Hemoglobin A1c <7.0%. More stringent glycemic goals (A1c <6.0%) may further reduce complications at the cost of increased risk of hypoglycemia.          Passed - eGFR in normal range and within 360 days    GFR calc Af Amer  Date Value Ref Range Status  05/08/2020 95 >59 mL/min/1.73 Final    Comment:    **[facility] currently reports eGFR in compliance with the current**   recommendations of the [employer]. [facility] will   update reporting as new guidelines are published from the NKF-ASN   Task force.    GFR calc non Af Wyvonnia Lora  Date Value  Ref Range Status  05/08/2020 82 >59 mL/min/1.73 Final         Passed - Valid encounter within last 6 months    Recent Outpatient Visits          1 month ago Type 2 diabetes mellitus with obesity ([place])   [place], Jolene T, NP   4 months ago Type 2 diabetes mellitus with obesity ([place])   [place], Jolene T, NP   8 months ago Type 2 diabetes mellitus with obesity ([place])   [place], Jolene T, NP   11 months ago Type 2 diabetes mellitus without complication, without long-term current use of insulin ([place])   [place], [place] T, NP   1 year ago Type 2 diabetes mellitus without complication, without long-term current use of insulin ([place])   [place], Barbaraann Faster, NP      Future Appointments            In 1 month Cannady, [place] T, NP [facility]   In 10 months  [facility]           . metFORMIN (GLUCOPHAGE) 1000 MG tablet [Pharmacy Med Name: METFORMIN HCL 1,000 MG TABLET] 180 tablet     Sig: TAKE 1 TABLET (1,000 MG TOTAL) BY MOUTH 2 (TWO) TIMES DAILY WITH A MEAL.     Endocrinology:  Diabetes - Biguanides Passed - 01/04/2021  4:52 PM      Passed - Cr in normal range and within 360 days    Creatinine, Ser  Date Value Ref Range Status  11/17/2020 0.67 0.57 - 1.00 mg/dL Final         Passed - HBA1C is between 0 and 7.9 and within 180 days    HB A1C (BAYER DCA - WAIVED)  Date Value Ref Range Status  11/17/2020 7.4 (H) <7.0 % Final    Comment:                                          Diabetic Adult            <7.0                                       Healthy Adult        4.3 - 5.7                                                           (DCCT/NGSP) American Diabetes Association's Summary of Glycemic Recommendations for Adults with Diabetes: Hemoglobin A1c <7.0%. More stringent glycemic goals (A1c <6.0%) may further reduce complications at the cost of increased risk of hypoglycemia.          Passed - eGFR in normal range and within 360 days    GFR calc Af Amer  Date Value Ref Range Status  05/08/2020 95 >59 mL/min/1.73 Final    Comment:    **[facility] currently reports eGFR in compliance with the current**   recommendations of the [employer]. [facility] will   update reporting  as new guidelines are published from the NKF-ASN   Task force.    GFR calc non Af Amer  Date Value Ref Range Status  05/08/2020 82 >59 mL/min/1.73 Final         Passed - Valid encounter within last 6 months    Recent Outpatient Visits          1 month ago Type 2 diabetes mellitus with obesity ([place])   [place], Jolene T, NP   4 months ago Type 2 diabetes mellitus with obesity ([place])   [place], Jolene T, NP   8 months ago Type 2 diabetes  mellitus with obesity ([place])   [place], Jolene T, NP   11 months ago Type 2 diabetes mellitus without complication, without long-term current use of insulin ([place])   [place], Meadow[place] T, NP   1 year ago Type 2 diabetes mellitus without complication, without long-term current use of insulin ([place])   [place], Barbaraann Faster, NP      Future Appointments            In 1 month Cannady, Barbaraann Faster, NP [facility]   In 10 months  [facility]           . ondansetron (ZOFRAN) 4 MG tablet [Pharmacy Med Name: ONDANSETRON HCL 4 MG TABLET] 40 tablet     Sig: TAKE 1 TABLET BY MOUTH EVERY 8 HOURS AS NEEDED FOR NAUSEA AND VOMITING     Not Delegated - Gastroenterology: Antiemetics Failed - 01/04/2021  4:52 PM      Failed - This refill cannot be delegated      Passed - Valid encounter within last 6 months    Recent Outpatient Visits          1 month ago Type 2 diabetes mellitus with obesity ([place])   [place], Jolene T, NP   4 months ago Type 2 diabetes mellitus with obesity ([place])   [place], Jolene T, NP   8 months ago Type 2 diabetes mellitus with obesity ([place])   [place], Jolene T, NP   11 months ago Type 2 diabetes mellitus without complication, without long-term current use of insulin ([place])   [place], [place] T, NP   1 year ago Type 2 diabetes mellitus without complication, without long-term current use of insulin ([place])   [place], Barbaraann Faster, NP      Future Appointments            In 1 month Cannady, Barbaraann Faster, NP [facility]   In 10 months  [facility]

## 2021-01-04 NOTE — Telephone Encounter (Signed)
Requested medication (s) are due for refill today: yes  Requested medication (s) are on the active medication list: yes  Last refill:  05/30/20  Future visit scheduled: yes  Notes to clinic:  med not delegated to NT to RF   Requested Prescriptions  Pending Prescriptions Disp Refills   ondansetron (ZOFRAN) 4 MG tablet [Pharmacy Med Name: ONDANSETRON HCL 4 MG TABLET] 40 tablet     Sig: TAKE 1 TABLET BY MOUTH EVERY 8 HOURS AS NEEDED FOR NAUSEA AND VOMITING      Not Delegated - Gastroenterology: Antiemetics Failed - 01/04/2021  4:52 PM      Failed - This refill cannot be delegated      Passed - Valid encounter within last 6 months    Recent Outpatient Visits           1 month ago Type 2 diabetes mellitus with obesity ([place])   [place], Jolene T, NP   4 months ago Type 2 diabetes mellitus with obesity ([place])   [place], Jolene T, NP   8 months ago Type 2 diabetes mellitus with obesity ([place])   [place], Jolene T, NP   11 months ago Type 2 diabetes mellitus without complication, without long-term current use of insulin ([place])   [place], Ideal T, NP   1 year ago Type 2 diabetes mellitus without complication, without long-term current use of insulin ([place])   [place], Wortham T, NP       Future Appointments             In 1 month Cannady, Barbaraann Faster, NP [facility]   In 10 months  [facility]              Refused Prescriptions Disp Refills   telmisartan (MICARDIS) 80 MG tablet [Pharmacy Med Name: TELMISARTAN 80 MG TABLET] 90 tablet     Sig: TAKE 1 TABLET BY MOUTH EVERY DAY      Cardiovascular:  Angiotensin Receptor Blockers Passed - 01/04/2021  4:52 PM      Passed - Cr in normal range and within 180 days    Creatinine, Ser  Date Value Ref Range Status  11/17/2020 0.67 0.57 - 1.00 mg/dL Final          Passed - K in  normal range and within 180 days    Potassium  Date Value Ref Range Status  11/17/2020 4.1 3.5 - 5.2 mmol/L Final          Passed - Patient is not pregnant      Passed - Last BP in normal range    BP Readings from Last 1 Encounters:  11/17/20 120/73          Passed - Valid encounter within last 6 months    Recent Outpatient Visits           1 month ago Type 2 diabetes mellitus with obesity ([place])   [place], Jolene T, NP   4 months ago Type 2 diabetes mellitus with obesity ([place])   [place], Jolene T, NP   8 months ago Type 2 diabetes mellitus with obesity ([place])   [place], Jolene T, NP   11 months ago Type 2 diabetes mellitus without complication, without long-term current use of insulin ([place])   [place], [place] T, NP   1 year ago Type 2  diabetes mellitus without complication, without long-term current use of insulin ([place])   [place], [place] T, NP       Future Appointments             In 1 month Cannady, Barbaraann Faster, NP [facility]   In 10 months  [facility]               rosuvastatin (CRESTOR) 20 MG tablet [employer] Med Name: ROSUVASTATIN CALCIUM 20 MG TAB] 90 tablet     Sig: TAKE 1 TABLET BY MOUTH EVERY DAY      Cardiovascular:  Antilipid - Statins Failed - 01/04/2021  4:52 PM      Failed - LDL in normal range and within 360 days    LDL Chol Calc (NIH)  Date Value Ref Range Status  11/17/2020 112 (H) 0 - 99 mg/dL Final          Failed - Triglycerides in normal range and within 360 days    Triglycerides  Date Value Ref Range Status  11/17/2020 205 (H) 0 - 149 mg/dL Final          Passed - Total Cholesterol in normal range and within 360 days    Cholesterol, Total  Date Value Ref Range Status  11/17/2020 196 100 - 199 mg/dL Final          Passed - HDL in normal range and within 360 days    HDL   Date Value Ref Range Status  11/17/2020 48 >39 mg/dL Final          Passed - Patient is not pregnant      Passed - Valid encounter within last 12 months    Recent Outpatient Visits           1 month ago Type 2 diabetes mellitus with obesity ([place])   [place], Jolene T, NP   4 months ago Type 2 diabetes mellitus with obesity ([place])   [place], Jolene T, NP   8 months ago Type 2 diabetes mellitus with obesity ([place])   [place], Jolene T, NP   11 months ago Type 2 diabetes mellitus without complication, without long-term current use of insulin ([place])   [place], Albion T, NP   1 year ago Type 2 diabetes mellitus without complication, without long-term current use of insulin ([place])   [place], Gerster T, NP       Future Appointments             In 1 month Cannady, [place] T, NP [facility]   In 10 months  [facility]               JARDIANCE 25 MG TABS tablet [employer] Med Name: JARDIANCE 25 MG TABLET] 90 tablet     Sig: TAKE 1 TABLET BY [place]      Endocrinology:  Diabetes - SGLT2 Inhibitors Failed - 01/04/2021  4:52 PM      Failed - LDL in normal range and within 360 days    LDL Chol Calc (NIH)  Date Value Ref Range Status  11/17/2020 112 (H) 0 - 99 mg/dL Final          Passed - Cr in normal range and within 360 days    Creatinine, Ser  Date Value Ref Range Status  11/17/2020 0.67 0.57 - 1.00 mg/dL Final  Passed - HBA1C is between 0 and 7.9 and within 180 days    HB A1C (BAYER DCA - WAIVED)  Date Value Ref Range Status  11/17/2020 7.4 (H) <7.0 % Final    Comment:                                          Diabetic Adult            <7.0                                       Healthy Adult        4.3 - 5.7                                                           (DCCT/NGSP) American Diabetes  Association's Summary of Glycemic Recommendations for Adults with Diabetes: Hemoglobin A1c <7.0%. More stringent glycemic goals (A1c <6.0%) may further reduce complications at the cost of increased risk of hypoglycemia.           Passed - eGFR in normal range and within 360 days    GFR calc Af Amer  Date Value Ref Range Status  05/08/2020 95 >59 mL/min/1.73 Final    Comment:    **[facility] currently reports eGFR in compliance with the current**   recommendations of the [employer]. [facility] will   update reporting as new guidelines are published from the NKF-ASN   Task force.    GFR calc non Af Amer  Date Value Ref Range Status  05/08/2020 82 >59 mL/min/1.73 Final          Passed - Valid encounter within last 6 months    Recent Outpatient Visits           1 month ago Type 2 diabetes mellitus with obesity ([place])   [place], Jolene T, NP   4 months ago Type 2 diabetes mellitus with obesity ([place])   [place], Jolene T, NP   8 months ago Type 2 diabetes mellitus with obesity ([place])   [place], Jolene T, NP   11 months ago Type 2 diabetes mellitus without complication, without long-term current use of insulin ([place])   [place], [place] T, NP   1 year ago Type 2 diabetes mellitus without complication, without long-term current use of insulin ([place])   [place], Barbaraann Faster, NP       Future Appointments             In 1 month Cannady, Barbaraann Faster, NP [facility]   In 10 months  [facility]               metFORMIN (GLUCOPHAGE) 1000 MG tablet [Pharmacy Med Name: METFORMIN HCL 1,000 MG TABLET] 180 tablet     Sig: TAKE 1 TABLET (1,000 MG TOTAL) BY MOUTH 2 (TWO) TIMES DAILY WITH A MEAL.      Endocrinology:  Diabetes - Biguanides Passed - 01/04/2021  4:52 PM      Passed - Cr in normal  range and within 360 days     Creatinine, Ser  Date Value Ref Range Status  11/17/2020 0.67 0.57 - 1.00 mg/dL Final          Passed - HBA1C is between 0 and 7.9 and within 180 days    HB A1C (BAYER DCA - WAIVED)  Date Value Ref Range Status  11/17/2020 7.4 (H) <7.0 % Final    Comment:                                          Diabetic Adult            <7.0                                       Healthy Adult        4.3 - 5.7                                                           (DCCT/NGSP) American Diabetes Association's Summary of Glycemic Recommendations for Adults with Diabetes: Hemoglobin A1c <7.0%. More stringent glycemic goals (A1c <6.0%) may further reduce complications at the cost of increased risk of hypoglycemia.           Passed - eGFR in normal range and within 360 days    GFR calc Af Amer  Date Value Ref Range Status  05/08/2020 95 >59 mL/min/1.73 Final    Comment:    **[facility] currently reports eGFR in compliance with the current**   recommendations of the [employer]. [facility] will   update reporting as new guidelines are published from the NKF-ASN   Task force.    GFR calc non Af Amer  Date Value Ref Range Status  05/08/2020 82 >59 mL/min/1.73 Final          Passed - Valid encounter within last 6 months    Recent Outpatient Visits           1 month ago Type 2 diabetes mellitus with obesity ([place])   [place], Jolene T, NP   4 months ago Type 2 diabetes mellitus with obesity ([place])   [place], Jolene T, NP   8 months ago Type 2 diabetes mellitus with obesity ([place])   [place], Jolene T, NP   11 months ago Type 2 diabetes mellitus without complication, without long-term current use of insulin ([place])   [place], [place] T, NP   1 year ago Type 2 diabetes mellitus without complication, without long-term current use of insulin ([place])   [place], Barbaraann Faster,  NP       Future Appointments             In 1 month Cannady, Barbaraann Faster, NP [facility]   In 10 months  [facility]

## 2021-01-05 NOTE — Telephone Encounter (Signed)
Pt last apt on 11/17/2020 per note Return in about 3 months (around 02/14/2021) for T2DM, HTN/HLD, GERD -- last A1c 7.4%. Pt is scheduled for  02/12/2021

## 2021-01-06 ENCOUNTER — Other Ambulatory Visit: Payer: Self-pay | Admitting: Nurse Practitioner

## 2021-02-05 ENCOUNTER — Other Ambulatory Visit: Payer: Self-pay | Admitting: Nurse Practitioner

## 2021-02-12 ENCOUNTER — Other Ambulatory Visit: Payer: Self-pay

## 2021-02-12 ENCOUNTER — Ambulatory Visit (INDEPENDENT_AMBULATORY_CARE_PROVIDER_SITE_OTHER): Payer: Medicare Other | Admitting: Nurse Practitioner

## 2021-02-12 ENCOUNTER — Encounter: Payer: Self-pay | Admitting: Nurse Practitioner

## 2021-02-12 VITALS — BP 103/62 | HR 67 | Temp 98.0°F | Wt 160.6 lb

## 2021-02-12 DIAGNOSIS — Z6830 Body mass index (BMI) 30.0-30.9, adult: Secondary | ICD-10-CM | POA: Diagnosis not present

## 2021-02-12 DIAGNOSIS — E1169 Type 2 diabetes mellitus with other specified complication: Secondary | ICD-10-CM | POA: Diagnosis not present

## 2021-02-12 DIAGNOSIS — E6609 Other obesity due to excess calories: Secondary | ICD-10-CM | POA: Diagnosis not present

## 2021-02-12 DIAGNOSIS — E538 Deficiency of other specified B group vitamins: Secondary | ICD-10-CM

## 2021-02-12 DIAGNOSIS — E669 Obesity, unspecified: Secondary | ICD-10-CM | POA: Diagnosis not present

## 2021-02-12 DIAGNOSIS — I152 Hypertension secondary to endocrine disorders: Secondary | ICD-10-CM | POA: Diagnosis not present

## 2021-02-12 DIAGNOSIS — K219 Gastro-esophageal reflux disease without esophagitis: Secondary | ICD-10-CM

## 2021-02-12 DIAGNOSIS — E1159 Type 2 diabetes mellitus with other circulatory complications: Secondary | ICD-10-CM

## 2021-02-12 DIAGNOSIS — E785 Hyperlipidemia, unspecified: Secondary | ICD-10-CM

## 2021-02-12 LAB — BAYER DCA HB A1C WAIVED: HB A1C (BAYER DCA - WAIVED): 7 % — ABNORMAL HIGH (ref <7.0)

## 2021-02-12 MED ORDER — METFORMIN HCL 1000 MG PO TABS: 1000.0000 mg | ORAL_TABLET | Freq: Two times a day (BID) | ORAL | 4 refills | Status: DC

## 2021-02-12 MED ORDER — HYDROCHLOROTHIAZIDE 25 MG PO TABS: 1.0000 | ORAL_TABLET | Freq: Every day | ORAL | 4 refills | Status: DC

## 2021-02-12 MED ORDER — ROSUVASTATIN CALCIUM 20 MG PO TABS: 20.0000 mg | ORAL_TABLET | Freq: Every day | ORAL | 4 refills | Status: DC

## 2021-02-12 MED ORDER — TELMISARTAN 80 MG PO TABS: 80.0000 mg | ORAL_TABLET | Freq: Every day | ORAL | 4 refills | Status: DC

## 2021-02-12 MED ORDER — EMPAGLIFLOZIN 25 MG PO TABS: 25.0000 mg | ORAL_TABLET | Freq: Every day | ORAL | 4 refills | Status: DC

## 2021-02-12 MED ORDER — OZEMPIC (1 MG/DOSE) 2 MG/1.5ML ~~LOC~~ SOPN: 1.0000 mg | PEN_INJECTOR | SUBCUTANEOUS | 4 refills | Status: DC

## 2021-02-12 NOTE — Assessment & Plan Note (Signed)
Recheck B12 level next visit  Restart supplement as needed.

## 2021-02-12 NOTE — Progress Notes (Signed)
BP 103/62   Pulse 67   Temp 98 F (36.7 C) (Oral)   Wt 160 lb 9.6 oz (72.8 kg)   LMP 07/13/2000 (Approximate)   SpO2 97%   BMI 29.37 kg/m    Subjective:    Patient ID: Charlene Juarez, female    DOB: 1951-09-13, 70 y.o.   MRN: [id number]  HPI: Charlene Juarez is a 70 y.o. female  Chief Complaint  Patient presents with  . Diabetes  . Hyperlipidemia  . Hypertension  . Gastroesophageal Reflux   DIABETES Continues on Metformin, Jardiance, and Ozempic. Recent A1C was 7.4% last visit.  History of low B12 levels, which have stabilized with supplement daily.  Has been working on diet -- lost 12 pounds. Hypoglycemic episodes:no Polydipsia/polyuria: no Visual disturbance: no Chest pain: no Paresthesias: no Glucose Monitoring: yes  Accucheck frequency: occasionally  Fasting glucose: 146 or less  Post prandial:  Evening:  Before meals: Taking Insulin?: no  Long acting insulin:  Short acting insulin: Blood Pressure Monitoring: not checking Retinal Examination: Not Up To Date -- Medicare does not cover Foot Exam: Up to Date Pneumovax: Up to Date Influenza: Up to Date Aspirin: no   HYPERTENSION / HYPERLIPIDEMIA Continues on Micardis, HCTZ, and Rosuvastatin (changed to this previous visit from Simvastatin).  She reported side effects with Simvastatin -- reports when taking this has chest discomfort at times, when she quit taking did not notice this.  Goes on June 14th to get CPAP supplies. Satisfied with current treatment? yes Duration of hypertension: chronic BP monitoring frequency: not checking BP range: none BP medication side effects: no Duration of hyperlipidemia: chronic Cholesterol medication side effects: no Cholesterol supplements: none Medication compliance: good compliance Aspirin: no Recent stressors: no Recurrent headaches: no Visual changes: no Palpitations: no Dyspnea: no Chest pain: no Lower extremity edema: no Dizzy/lightheaded:  no  The 10-year ASCVD risk score Mikey Bussing DC Jr., et al., 2013) is: 14.3%   Values used to calculate the score:     Age: 27 years     Sex: Female     Is Non-Hispanic African American: No     Diabetic: Yes     Tobacco smoker: No     Systolic Blood Pressure: 341 mmHg     Is BP treated: Yes     HDL Cholesterol: 48 mg/dL     Total Cholesterol: 196 mg/dL   GERD Takes occasional Zofran -- only needs this when eats spaghetti.  Does not take any PPI or H2 Blockers.   GERD control status: stable  Satisfied with current treatment? yes Heartburn frequency: a few times a month Medication side effects: no  Medication compliance: stable Dysphagia: no Odynophagia:  no Hematemesis: no Blood in stool: no EGD: no  Relevant past medical, surgical, family and social history reviewed and updated as indicated. Interim medical history since our last visit reviewed. Allergies and medications reviewed and updated.  Review of Systems  Constitutional: Negative for activity change, appetite change, diaphoresis, fatigue and fever.  Respiratory: Negative for cough, chest tightness, shortness of breath and wheezing.   Cardiovascular: Negative for chest pain, palpitations and leg swelling.  Gastrointestinal: Negative.   Endocrine: Negative for cold intolerance, heat intolerance, polydipsia, polyphagia and polyuria.  Neurological: Negative.   Psychiatric/Behavioral: Negative.    Per HPI unless specifically indicated above     Objective:    BP 103/62   Pulse 67   Temp 98 F (36.7 C) (Oral)   Wt 160 lb 9.6 oz (72.8  kg)   LMP 07/13/2000 (Approximate)   SpO2 97%   BMI 29.37 kg/m   Wt Readings from Last 3 Encounters:  02/12/21 160 lb 9.6 oz (72.8 kg)  11/17/20 172 lb 6.4 oz (78.2 kg)  11/14/20 170 lb (77.1 kg)    Physical Exam Vitals and nursing note reviewed.  Constitutional:      General: She is awake. She is not in acute distress.    Appearance: She is well-developed. She is not ill-appearing.   HENT:     Head: Normocephalic.     Right Ear: Hearing normal.     Left Ear: Hearing normal.     Nose: Nose normal.     Mouth/Throat:     Mouth: Mucous membranes are moist.  Eyes:     General: Lids are normal.        Right eye: No discharge.        Left eye: No discharge.     Conjunctiva/sclera: Conjunctivae normal.     Pupils: Pupils are equal, round, and reactive to light.  Neck:     Thyroid: No thyromegaly.     Vascular: No carotid bruit.  Cardiovascular:     Rate and Rhythm: Normal rate and regular rhythm.     Heart sounds: Normal heart sounds. No murmur heard. No gallop.   Pulmonary:     Effort: Pulmonary effort is normal. No accessory muscle usage or respiratory distress.     Breath sounds: Normal breath sounds.  Abdominal:     General: Bowel sounds are normal.     Palpations: Abdomen is soft.     Tenderness: There is no abdominal tenderness.  Musculoskeletal:     Cervical back: Normal range of motion and neck supple.     Right lower leg: No edema.     Left lower leg: No edema.  Lymphadenopathy:     Cervical: No cervical adenopathy.  Skin:    General: Skin is warm and dry.  Neurological:     Mental Status: She is alert and oriented to person, place, and time.  Psychiatric:        Attention and Perception: Attention normal.        Mood and Affect: Mood normal.        Speech: Speech normal.        Behavior: Behavior normal. Behavior is cooperative.        Thought Content: Thought content normal.        Judgment: Judgment normal.    Results for orders placed or performed in visit on 11/17/20  Bayer DCA Hb A1c Waived  Result Value Ref Range   HB A1C (BAYER DCA - WAIVED) 7.4 (H) <7.0 %  Comprehensive metabolic panel  Result Value Ref Range   Glucose 134 (H) 65 - 99 mg/dL   BUN 15 8 - 27 mg/dL   Creatinine, Ser 0.67 0.57 - 1.00 mg/dL   eGFR 95 >59 mL/min/1.73   BUN/Creatinine Ratio 22 12 - 28   Sodium 137 134 - 144 mmol/L   Potassium 4.1 3.5 - 5.2 mmol/L    Chloride 99 96 - 106 mmol/L   CO2 25 20 - 29 mmol/L   Calcium 9.3 8.7 - 10.3 mg/dL   Total Protein 6.7 6.0 - 8.5 g/dL   Albumin 4.4 3.8 - 4.8 g/dL   Globulin, Total 2.3 1.5 - 4.5 g/dL   Albumin/Globulin Ratio 1.9 1.2 - 2.2   Bilirubin Total 0.4 0.0 - 1.2 mg/dL   Alkaline Phosphatase 64  44 - 121 IU/L   AST 25 0 - 40 IU/L   ALT 34 (H) 0 - 32 IU/L  Lipid Panel w/o Chol/HDL Ratio  Result Value Ref Range   Cholesterol, Total 196 100 - 199 mg/dL   Triglycerides 205 (H) 0 - 149 mg/dL   HDL 48 >39 mg/dL   VLDL Cholesterol Cal 36 5 - 40 mg/dL   LDL Chol Calc (NIH) 112 (H) 0 - 99 mg/dL  Vitamin B12  Result Value Ref Range   Vitamin B-12 550 232 - 1,245 pg/mL  VITAMIN D 25 Hydroxy (Vit-D Deficiency, Fractures)  Result Value Ref Range   Vit D, 25-Hydroxy 39.4 30.0 - 100.0 ng/mL  Microalbumin, Urine Waived  Result Value Ref Range   Microalb, Ur Waived 10 0 - 19 mg/L   Creatinine, Urine Waived 100 10 - 300 mg/dL   Microalb/Creat Ratio <30 <30 mg/g      Assessment & Plan:   Problem List Items Addressed This Visit      Cardiovascular and Mediastinum   Hypertension associated with diabetes ([place])    Chronic, stable with BP at goal in office and at home.  Continue current medication regimen, Micardis for kidney protection, and adjust as needed.  CMP next visit.  Recommend she continue to check BP at home on occasion and document for visits. Focus on DASH diet.  Return in 3 months.      Relevant Medications   hydrochlorothiazide (HYDRODIURIL) 25 MG tablet   empagliflozin (JARDIANCE) 25 MG TABS tablet   metFORMIN (GLUCOPHAGE) 1000 MG tablet   rosuvastatin (CRESTOR) 20 MG tablet   Semaglutide, 1 MG/DOSE, (OZEMPIC, 1 MG/DOSE,) 2 MG/1.5ML SOPN   telmisartan (MICARDIS) 80 MG tablet     Digestive   GERD (gastroesophageal reflux disease)    Chronic, stable.  Continue Zofran as needed.  Refills as needed.          Endocrine   Type 2 diabetes mellitus with obesity (HCC) - Primary     Chronic, stable with A1C 7%, slight trend downwards -- continue weight loss and diet changes. Urine ALB obtained in February 2022.  Continue current medication regimen, including Jardiance, Metformin, and Ozempic.  To notify provider if sugars consistently read > 130, continue to monitor these at home closely.  Recommend return to diet and exercise focus.  Return in 3 months.      Relevant Medications   empagliflozin (JARDIANCE) 25 MG TABS tablet   metFORMIN (GLUCOPHAGE) 1000 MG tablet   rosuvastatin (CRESTOR) 20 MG tablet   Semaglutide, 1 MG/DOSE, (OZEMPIC, 1 MG/DOSE,) 2 MG/1.5ML SOPN   telmisartan (MICARDIS) 80 MG tablet   Other Relevant Orders   Bayer DCA Hb A1c Waived   Hyperlipidemia associated with type 2 diabetes mellitus (HCC)    Chronic, ongoing.  Will continue Rosuvastatin daily and adjust dose as needed.  Return to office in 3 months and obtain lipid panel.      Relevant Medications   empagliflozin (JARDIANCE) 25 MG TABS tablet   metFORMIN (GLUCOPHAGE) 1000 MG tablet   rosuvastatin (CRESTOR) 20 MG tablet   Semaglutide, 1 MG/DOSE, (OZEMPIC, 1 MG/DOSE,) 2 MG/1.5ML SOPN   telmisartan (MICARDIS) 80 MG tablet     Other   Obesity    BMI 29.37, has lost 12 pounds since recent visit.  Praised for this.  Recommended eating smaller high protein, low fat meals more frequently and exercising 30 mins a day 5 times a week with a goal of 10-15lb weight loss in  the next 3 months. Patient voiced their understanding and motivation to adhere to these recommendations.       Relevant Medications   empagliflozin (JARDIANCE) 25 MG TABS tablet   metFORMIN (GLUCOPHAGE) 1000 MG tablet   Semaglutide, 1 MG/DOSE, (OZEMPIC, 1 MG/DOSE,) 2 MG/1.5ML SOPN   B12 deficiency    Recheck B12 level next visit  Restart supplement as needed.          Follow up plan: Return in about 3 months (around 05/15/2021) for T2DM, HTN/HLD, OSA, VIT D, B12, GERD.

## 2021-02-12 NOTE — Patient Instructions (Signed)
Diabetes Mellitus and Nutrition, Adult When you have diabetes, or diabetes mellitus, it is very important to have healthy eating habits because your blood sugar (glucose) levels are greatly affected by what you eat and drink. Eating healthy foods in the right amounts, at about the same times every day, can help you:  Control your blood glucose.  Lower your risk of heart disease.  Improve your blood pressure.  Reach or maintain a healthy weight. What can affect my meal plan? Every person with diabetes is different, and each person has different needs for a meal plan. Your health care provider may recommend that you work with a dietitian to make a meal plan that is best for you. Your meal plan may vary depending on factors such as:  The calories you need.  The medicines you take.  Your weight.  Your blood glucose, blood pressure, and cholesterol levels.  Your activity level.  Other health conditions you have, such as heart or kidney disease. How do carbohydrates affect me? Carbohydrates, also called carbs, affect your blood glucose level more than any other type of food. Eating carbs naturally raises the amount of glucose in your blood. Carb counting is a method for keeping track of how many carbs you eat. Counting carbs is important to keep your blood glucose at a healthy level, especially if you use insulin or take certain oral diabetes medicines. It is important to know how many carbs you can safely have in each meal. This is different for every person. Your dietitian can help you calculate how many carbs you should have at each meal and for each snack. How does alcohol affect me? Alcohol can cause a sudden decrease in blood glucose (hypoglycemia), especially if you use insulin or take certain oral diabetes medicines. Hypoglycemia can be a life-threatening condition. Symptoms of hypoglycemia, such as sleepiness, dizziness, and confusion, are similar to symptoms of having too much  alcohol.  Do not drink alcohol if: ? Your health care provider tells you not to drink. ? You are pregnant, may be pregnant, or are planning to become pregnant.  If you drink alcohol: ? Do not drink on an empty stomach. ? Limit how much you use to:  0-1 drink a day for women.  0-2 drinks a day for men. ? Be aware of how much alcohol is in your drink. In the [place] one drink equals one 12 oz bottle of beer (355 mL), one 5 oz glass of wine (148 mL), or one 1 oz glass of hard liquor (44 mL). ? Keep yourself hydrated with water, diet soda, or unsweetened iced tea.  Keep in mind that regular soda, juice, and other mixers may contain a lot of sugar and must be counted as carbs. What are tips for following this plan? Reading food labels  Start by checking the serving size on the "Nutrition Facts" label of packaged foods and drinks. The amount of calories, carbs, fats, and other nutrients listed on the label is based on one serving of the item. Many items contain more than one serving per package.  Check the total grams (g) of carbs in one serving. You can calculate the number of servings of carbs in one serving by dividing the total carbs by 15. For example, if a food has 30 g of total carbs per serving, it would be equal to 2 servings of carbs.  Check the number of grams (g) of saturated fats and trans fats in one serving. Choose foods that have   a low amount or none of these fats.  Check the number of milligrams (mg) of salt (sodium) in one serving. Most people should limit total sodium intake to less than 2,300 mg per day.  Always check the nutrition information of foods labeled as "low-fat" or "nonfat." These foods may be higher in added sugar or refined carbs and should be avoided.  Talk to your dietitian to identify your daily goals for nutrients listed on the label. Shopping  Avoid buying canned, pre-made, or processed foods. These foods tend to be high in fat, sodium, and added  sugar.  Shop around the outside edge of the grocery store. This is where you will most often find fresh fruits and vegetables, bulk grains, fresh meats, and fresh dairy. Cooking  Use low-heat cooking methods, such as baking, instead of high-heat cooking methods like deep frying.  Cook using healthy oils, such as olive, canola, or sunflower oil.  Avoid cooking with butter, cream, or high-fat meats. Meal planning  Eat meals and snacks regularly, preferably at the same times every day. Avoid going long periods of time without eating.  Eat foods that are high in fiber, such as fresh fruits, vegetables, beans, and whole grains. Talk with your dietitian about how many servings of carbs you can eat at each meal.  Eat 4-6 oz (112-168 g) of lean protein each day, such as lean meat, chicken, fish, eggs, or tofu. One ounce (oz) of lean protein is equal to: ? 1 oz (28 g) of meat, chicken, or fish. ? 1 egg. ?  cup (62 g) of tofu.  Eat some foods each day that contain healthy fats, such as avocado, nuts, seeds, and fish.   What foods should I eat? Fruits Berries. Apples. Oranges. Peaches. Apricots. Plums. Grapes. Mango. Papaya. Pomegranate. Kiwi. Cherries. Vegetables Lettuce. Spinach. Leafy greens, including kale, chard, collard greens, and mustard greens. Beets. Cauliflower. Cabbage. Broccoli. Carrots. Green beans. Tomatoes. Peppers. Onions. Cucumbers. Brussels sprouts. Grains Whole grains, such as whole-wheat or whole-grain bread, crackers, tortillas, cereal, and pasta. Unsweetened oatmeal. Quinoa. Brown or wild rice. Meats and other proteins Seafood. Poultry without skin. Lean cuts of poultry and beef. Tofu. Nuts. Seeds. Dairy Low-fat or fat-free dairy products such as milk, yogurt, and cheese. The items listed above may not be a complete list of foods and beverages you can eat. Contact a dietitian for more information. What foods should I avoid? Fruits Fruits canned with  syrup. Vegetables Canned vegetables. Frozen vegetables with butter or cream sauce. Grains Refined white flour and flour products such as bread, pasta, snack foods, and cereals. Avoid all processed foods. Meats and other proteins Fatty cuts of meat. Poultry with skin. Breaded or fried meats. Processed meat. Avoid saturated fats. Dairy Full-fat yogurt, cheese, or milk. Beverages Sweetened drinks, such as soda or iced tea. The items listed above may not be a complete list of foods and beverages you should avoid. Contact a dietitian for more information. Questions to ask a health care provider  Do I need to meet with a diabetes educator?  Do I need to meet with a dietitian?  What number can I call if I have questions?  When are the best times to check my blood glucose? Where to find more information:  American Diabetes Association: diabetes.org  Academy of Nutrition and Dietetics: www.eatright.org  National Institute of Diabetes and Digestive and Kidney Diseases: www.niddk.nih.gov  Association of Diabetes Care and Education Specialists: www.diabeteseducator.org Summary  It is important to have healthy eating   habits because your blood sugar (glucose) levels are greatly affected by what you eat and drink.  A healthy meal plan will help you control your blood glucose and maintain a healthy lifestyle.  Your health care provider may recommend that you work with a dietitian to make a meal plan that is best for you.  Keep in mind that carbohydrates (carbs) and alcohol have immediate effects on your blood glucose levels. It is important to count carbs and to use alcohol carefully. This information is not intended to replace advice given to you by your health care provider. Make sure you discuss any questions you have with your health care provider. Document Revised: 08/14/2019 Document Reviewed: 08/14/2019 Elsevier Patient Education  2021 Elsevier Inc.  

## 2021-02-12 NOTE — Assessment & Plan Note (Signed)
BMI 29.37, has lost 12 pounds since recent visit.  Praised for this.  Recommended eating smaller high protein, low fat meals more frequently and exercising 30 mins a day 5 times a week with a goal of 10-15lb weight loss in the next 3 months. Patient voiced their understanding and motivation to adhere to these recommendations.

## 2021-02-12 NOTE — Assessment & Plan Note (Signed)
Chronic, stable with BP at goal in office and at home.  Continue current medication regimen, Micardis for kidney protection, and adjust as needed.  CMP next visit.  Recommend she continue to check BP at home on occasion and document for visits. Focus on DASH diet.  Return in 3 months.

## 2021-02-12 NOTE — Assessment & Plan Note (Signed)
Chronic, stable with A1C 7%, slight trend downwards -- continue weight loss and diet changes. Urine ALB obtained in February 2022.  Continue current medication regimen, including Jardiance, Metformin, and Ozempic.  To notify provider if sugars consistently read > 130, continue to monitor these at home closely.  Recommend return to diet and exercise focus.  Return in 3 months.

## 2021-02-12 NOTE — Assessment & Plan Note (Signed)
Chronic, ongoing.  Will continue Rosuvastatin daily and adjust dose as needed.  Return to office in 3 months and obtain lipid panel.

## 2021-02-12 NOTE — Assessment & Plan Note (Signed)
Chronic, stable.  Continue Zofran as needed.  Refills as needed.

## 2021-04-01 ENCOUNTER — Telehealth: Payer: Self-pay

## 2021-04-01 ENCOUNTER — Encounter: Payer: Self-pay | Admitting: Nurse Practitioner

## 2021-04-01 NOTE — Telephone Encounter (Signed)
Copied from [place] [id number]. Topic: General - Other >> Apr 01, 2021 11:56 AM Antonieta Iba C wrote: Reason for CRM: pt called in for assistance. Pt would like to pick up her letter for jury duty instead. Pt says that she's isn't sure of how to print from my-chart.    CB: [phone]   Lvm letting pt know form is in completed bin ready for pick up.

## 2021-05-02 ENCOUNTER — Other Ambulatory Visit: Payer: Self-pay | Admitting: Nurse Practitioner

## 2021-05-03 NOTE — Telephone Encounter (Signed)
Requested medication (s) are due for refill today: yes  Requested medication (s) are on the active medication list: yes  Last refill:  01/05/21 #40  Future visit scheduled: yes  Notes to clinic:  med not delegated to NT to RF   Requested Prescriptions  Pending Prescriptions Disp Refills   ondansetron (ZOFRAN) 4 MG tablet [Pharmacy Med Name: ONDANSETRON HCL 4 MG TABLET] 40 tablet     Sig: TAKE 1 TABLET BY MOUTH EVERY 8 HOURS AS NEEDED FOR NAUSEA AND VOMITING     Not Delegated - Gastroenterology: Antiemetics Failed - 05/02/2021  8:34 PM      Failed - This refill cannot be delegated      Passed - Valid encounter within last 6 months    Recent Outpatient Visits           2 months ago Type 2 diabetes mellitus with obesity ([place])   [place], Jolene T, NP   5 months ago Type 2 diabetes mellitus with obesity ([place])   [place], Jolene T, NP   8 months ago Type 2 diabetes mellitus with obesity ([place])   [place], Jolene T, NP   12 months ago Type 2 diabetes mellitus with obesity ([place])   [place], Jolene T, NP   1 year ago Type 2 diabetes mellitus without complication, without long-term current use of insulin ([place])   [place], Barbaraann Faster, NP       Future Appointments             In 3 weeks [place], Barbaraann Faster, NP [facility]   In 6 months  [facility]            Refused Prescriptions Disp Refills   traZODone (DESYREL) 50 MG tablet [Pharmacy Med Name: TRAZODONE 50 MG TABLET] 30 tablet     Sig: TAKE 1/2 TO 1 TABLETS BY MOUTH AT BEDTIME AS NEEDED FOR SLEEP.     Psychiatry: Antidepressants - Serotonin Modulator Passed - 05/02/2021  8:34 PM      Passed - Valid encounter within last 6 months    Recent Outpatient Visits           2 months ago Type 2 diabetes mellitus with obesity ([place])   [place], Jolene T, NP    5 months ago Type 2 diabetes mellitus with obesity ([place])   [place], Jolene T, NP   8 months ago Type 2 diabetes mellitus with obesity ([place])   [place], Jolene T, NP   12 months ago Type 2 diabetes mellitus with obesity ([place])   [place], Jolene T, NP   1 year ago Type 2 diabetes mellitus without complication, without long-term current use of insulin ([place])   [place], Barbaraann Faster, NP       Future Appointments             In 3 weeks Cannady, Barbaraann Faster, NP [facility]   In 6 months  [facility]

## 2021-05-03 NOTE — Telephone Encounter (Signed)
Requested Prescriptions  Pending Prescriptions Disp Refills  . ondansetron (ZOFRAN) 4 MG tablet [Pharmacy Med Name: ONDANSETRON HCL 4 MG TABLET] 40 tablet     Sig: TAKE 1 TABLET BY MOUTH EVERY 8 HOURS AS NEEDED FOR NAUSEA AND VOMITING     Not Delegated - Gastroenterology: Antiemetics Failed - 05/02/2021  8:34 PM      Failed - This refill cannot be delegated      Passed - Valid encounter within last 6 months    Recent Outpatient Visits          2 months ago Type 2 diabetes mellitus with obesity ([place])   [place], Jolene T, NP   5 months ago Type 2 diabetes mellitus with obesity ([place])   [place], Jolene T, NP   8 months ago Type 2 diabetes mellitus with obesity ([place])   [place], Jolene T, NP   12 months ago Type 2 diabetes mellitus with obesity ([place])   [place], Jolene T, NP   1 year ago Type 2 diabetes mellitus without complication, without long-term current use of insulin ([place])   [place], Barbaraann Faster, NP      Future Appointments            In 3 weeks Cannady, Barbaraann Faster, NP [facility]   In 6 months  [facility]           . traZODone (DESYREL) 50 MG tablet [Pharmacy Med Name: TRAZODONE 50 MG TABLET] 30 tablet     Sig: TAKE 1/2 TO 1 TABLETS BY MOUTH AT BEDTIME AS NEEDED FOR SLEEP.     Psychiatry: Antidepressants - Serotonin Modulator Passed - 05/02/2021  8:34 PM      Passed - Valid encounter within last 6 months    Recent Outpatient Visits          2 months ago Type 2 diabetes mellitus with obesity ([place])   [place], Jolene T, NP   5 months ago Type 2 diabetes mellitus with obesity ([place])   [place], Jolene T, NP   8 months ago Type 2 diabetes mellitus with obesity ([place])   [place], Jolene T, NP   12 months ago Type 2 diabetes mellitus with obesity ([place])    [place], Jolene T, NP   1 year ago Type 2 diabetes mellitus without complication, without long-term current use of insulin ([place])   [place], Barbaraann Faster, NP      Future Appointments            In 3 weeks Cann[place], Barbaraann Faster, NP [facility]   In 6 months  [facility]

## 2021-05-11 ENCOUNTER — Other Ambulatory Visit: Payer: Self-pay | Admitting: Nurse Practitioner

## 2021-05-11 NOTE — Telephone Encounter (Signed)
Medication discontinued 11/17/20 . Patient not taking

## 2021-05-26 ENCOUNTER — Ambulatory Visit (INDEPENDENT_AMBULATORY_CARE_PROVIDER_SITE_OTHER): Payer: Medicare Other | Admitting: Nurse Practitioner

## 2021-05-26 ENCOUNTER — Encounter: Payer: Self-pay | Admitting: Nurse Practitioner

## 2021-05-26 ENCOUNTER — Other Ambulatory Visit: Payer: Self-pay

## 2021-05-26 VITALS — BP 100/59 | HR 73 | Temp 98.3°F | Wt 165.2 lb

## 2021-05-26 DIAGNOSIS — E785 Hyperlipidemia, unspecified: Secondary | ICD-10-CM

## 2021-05-26 DIAGNOSIS — E6609 Other obesity due to excess calories: Secondary | ICD-10-CM

## 2021-05-26 DIAGNOSIS — I152 Hypertension secondary to endocrine disorders: Secondary | ICD-10-CM

## 2021-05-26 DIAGNOSIS — E1169 Type 2 diabetes mellitus with other specified complication: Secondary | ICD-10-CM | POA: Diagnosis not present

## 2021-05-26 DIAGNOSIS — E669 Obesity, unspecified: Secondary | ICD-10-CM

## 2021-05-26 DIAGNOSIS — E1159 Type 2 diabetes mellitus with other circulatory complications: Secondary | ICD-10-CM | POA: Diagnosis not present

## 2021-05-26 DIAGNOSIS — Z6830 Body mass index (BMI) 30.0-30.9, adult: Secondary | ICD-10-CM | POA: Diagnosis not present

## 2021-05-26 LAB — BAYER DCA HB A1C WAIVED: HB A1C (BAYER DCA - WAIVED): 7.5 % — ABNORMAL HIGH (ref 4.8–5.6)

## 2021-05-26 MED ORDER — TRAZODONE HCL 50 MG PO TABS: 25.0000 mg | ORAL_TABLET | Freq: Every evening | ORAL | 3 refills | Status: DC | PRN

## 2021-05-26 MED ORDER — SEMAGLUTIDE (2 MG/DOSE) 8 MG/3ML ~~LOC~~ SOPN: 2.0000 mg | PEN_INJECTOR | SUBCUTANEOUS | 4 refills | Status: DC

## 2021-05-26 NOTE — Patient Instructions (Signed)
Diabetes Mellitus Action Plan °Following a diabetes action plan is a way for you to manage your diabetes (diabetes mellitus) symptoms. The plan is color-coded to help you understand what actions you need to take based on any symptoms you are having. °If you have symptoms in the red zone, you need medical care right away. °If you have symptoms in the yellow zone, you are having problems. °If you have symptoms in the green zone, you are doing well. °Learning about and understanding diabetes can take time. Follow the plan that you develop with your health care provider. Know the target range for your blood sugar (glucose) level, and review your treatment plan with your health care provider at each visit. °The target range for my blood sugar level is __________________________ mg/dL. °Red zone °Get medical help right away if you have any of the following symptoms: °A blood sugar test result that is below 54 mg/dL (3 mmol/L). °A blood sugar test result that is at or above 240 mg/dL (13.3 mmol/L) for 2 days in a row. °Confusion or trouble thinking clearly. °Difficulty breathing. °Sickness or a fever for 2 or more days that is not getting better. °Moderate or large ketone levels in your urine. °Feeling tired or having no energy. °If you have any red zone symptoms, do not wait to see if the symptoms will go away. Get medical help right away. Call your local emergency services (911 in the [place] Do not drive yourself to the hospital. °If you have severely low blood sugar (severe hypoglycemia) and you cannot eat or drink, you may need glucagon. Make sure a family member or close friend knows how to check your blood sugar and how to give you glucagon. You may need to be treated in a hospital for this condition. °Yellow zone °If you have any of the following symptoms, your diabetes is not under control and you may need to make some changes: °A blood sugar test result that is at or above 240 mg/dL (13.3 mmol/L) for 2 days in a  row. °Blood sugar test results that are below 70 mg/dL (3.9 mmol/L). °Other symptoms of hypoglycemia, such as: °Shaking or feeling light-headed. °Confusion or irritability. °Feeling hungry. °Having a fast heartbeat. °If you have any yellow zone symptoms: °Treat your hypoglycemia by eating or drinking 15 grams of a rapid-acting carbohydrate. Follow the 15:15 rule: °Take 15 grams of a rapid-acting carbohydrate, such as: °1 tube of glucose gel. °4 glucose pills. °4 oz (120 mL) of fruit juice. °4 oz (120 mL) of regular (not diet) soda. °Check your blood sugar 15 minutes after you take the carbohydrate. °If the repeat blood sugar test is still at or below 70 mg/dL (3.9 mmol/L), take 15 grams of a carbohydrate again. °If your blood sugar does not increase above 70 mg/dL (3.9 mmol/L) after 3 tries, get medical help right away. °After your blood sugar returns to normal, eat a meal or a snack within 1 hour. °Keep taking your daily medicines as told by your health care provider. °Check your blood sugar more often than you normally would. °Write down your results. °Call your health care provider if you have trouble keeping your blood sugar in your target range. ° °Green zone °These signs mean you are doing well and you can continue what you are doing to manage your diabetes: °Your blood sugar is within your personal target range. For most people, a blood sugar level before a meal (preprandial) should be 80-130 mg/dL (4.4-7.2 mmol/L). °You feel   well, and you are able to do daily activities. °If you are in the green zone, continue to manage your diabetes as told by your health care provider. To do this: °Eat a healthy diet. °Exercise regularly. °Check your blood sugar as told by your health care provider. °Take your medicines as told by your health care provider. ° °Where to find more information °American Diabetes Association (ADA): diabetes.org °Association of Diabetes Care & Education Specialists (ADCES):  diabeteseducator.org °Summary °Following a diabetes action plan is a way for you to manage your diabetes symptoms. The plan is color-coded to help you understand what actions you need to take based on any symptoms you are having. °Follow the plan that you develop with your health care provider. Make sure you know your personal target blood sugar level. °Review your treatment plan with your health care provider at each visit. °This information is not intended to replace advice given to you by your health care provider. Make sure you discuss any questions you have with your health care provider. °Document Revised: 03/13/2020 Document Reviewed: 03/13/2020 °Elsevier Patient Education © 2022 [employer]. ° °

## 2021-05-26 NOTE — Progress Notes (Addendum)
BP (!) 100/59   Pulse 73   Temp 98.3 F (36.8 C) (Oral)   Wt 165 lb 3.2 oz (74.9 kg)   LMP 07/13/2000 (Approximate)   SpO2 96%   BMI 30.22 kg/m    Subjective:    Patient ID: Charlene Juarez, female    DOB: 1950-12-10, 70 y.o.   MRN: [id number]  HPI: Charlene Juarez is a 70 y.o. female  Chief Complaint  Patient presents with   Diabetes    Patient states she was on Keto Diet for two weeks last month and states it caused some stomach issues and had to stop. Patient states she has noticed her sugar readings have been elevated and would like to discuss insulin. Patient declined having a recent diabetic eye exam due to the cost and not having insurance.    Hyperlipidemia   Hypertension   DIABETES Patient was on keto diet, stopped two weeks ago due to same being too restrictive and having abdominal cramping. A1c today 7.5%. Patient thinks increase in A1c is as a result of her sampling her baked products. Hypoglycemic episodes:no Polydipsia/polyuria: no Visual disturbance: no Chest pain: no Paresthesias: no Glucose Monitoring: no  Accucheck frequency: Not Checking  Fasting glucose:  Post prandial:  Evening:  Before meals: Taking Insulin?: no  Long acting insulin:  Short acting insulin: Blood Pressure Monitoring: yes Retinal Examination: Up to Date Foot Exam: Up to Date Diabetic Education: Completed Pneumovax: Up to Date Influenza: Up to Date Aspirin: no   HYPERTENSION / HYPERLIPIDEMIA Patient stated that three days ago she went out in her yard to help her son fix something. When she bent down her heart started palpitating, it quickly resolved, had no more episodes since. Thinks this happened due to outside being hot.  Satisfied with current treatment? yes Duration of hypertension: chronic BP monitoring frequency: yes BP range:  BP medication side effects: no Duration of hyperlipidemia: chronic Cholesterol medication side effects: no Cholesterol  supplements: fish oil Medication compliance: good compliance Aspirin: no Recent stressors: no Recurrent headaches: no Visual changes: no Palpitations: no Dyspnea: no Chest pain: no Lower extremity edema: no Dizzy/lightheaded: no  Relevant past medical, surgical, family and social history reviewed and updated as indicated. Interim medical history since our last visit reviewed. Allergies and medications reviewed and updated.  Review of Systems  Constitutional: Negative.   Eyes: Negative.   Respiratory: Negative.    Cardiovascular: Negative.   Gastrointestinal: Negative.   Endocrine: Negative.   Neurological: Negative.    Per HPI unless specifically indicated above     Objective:    BP (!) 100/59   Pulse 73   Temp 98.3 F (36.8 C) (Oral)   Wt 165 lb 3.2 oz (74.9 kg)   LMP 07/13/2000 (Approximate)   SpO2 96%   BMI 30.22 kg/m   Wt Readings from Last 3 Encounters:  05/26/21 165 lb 3.2 oz (74.9 kg)  02/12/21 160 lb 9.6 oz (72.8 kg)  11/17/20 172 lb 6.4 oz (78.2 kg)    Physical Exam Vitals and nursing note reviewed.  Constitutional:      General: She is awake. She is not in acute distress.    Appearance: She is well-developed and well-groomed. She is obese. She is not ill-appearing or toxic-appearing.  HENT:     Head: Normocephalic.  Eyes:     General: Lids are normal. No scleral icterus.    Pupils: Pupils are equal, round, and reactive to light.  Neck:  Vascular: No carotid bruit.  Cardiovascular:     Rate and Rhythm: Normal rate and regular rhythm.     Pulses: Normal pulses.     Heart sounds: Normal heart sounds. No murmur heard.   No gallop.  Pulmonary:     Effort: Pulmonary effort is normal. No accessory muscle usage or respiratory distress.     Breath sounds: Normal breath sounds.  Abdominal:     General: Bowel sounds are normal. There is no distension.     Palpations: Abdomen is soft.  Musculoskeletal:     Cervical back: Full passive range of motion  without pain.     Right lower leg: No edema.     Left lower leg: No edema.  Skin:    General: Skin is warm and dry.  Neurological:     Mental Status: She is alert.  Psychiatric:        Attention and Perception: Attention normal.        Mood and Affect: Mood normal.        Speech: Speech normal.        Behavior: Behavior is cooperative.        Thought Content: Thought content normal.    Results for orders placed or performed in visit on 02/12/21  Bayer DCA Hb A1c Waived  Result Value Ref Range   HB A1C (BAYER DCA - WAIVED) 7.0 (H) <7.0 %      Assessment & Plan:   Problem List Items Addressed This Visit       Cardiovascular and Mediastinum   Hypertension associated with diabetes ([place])    Chronic, stable. BP on lower side today, to monitor at home closely and may adjust regimen if lower at home.  Continue current medication regimen and adjust as needed based on pressure.  Increase water intake at home and focus on DASH diet.  Obtain BMP today.  Return in 3 months, sooner if lower BP levels at home.       Relevant Medications   Semaglutide, 2 MG/DOSE, 8 MG/3ML SOPN   Other Relevant Orders   Bayer DCA Hb A1c Waived   Basic metabolic panel     Endocrine   Type 2 diabetes mellitus with obesity (HCC) - Primary    Chronic, ongoing.  A1c trended up today at 7.5%, will increase ozempic to 2 mg, educated about eating a healthy diet. Continue remainder of medication regimen with goal of avoiding insulin.  Recommend she check BS daily fasting and document for provider visits.  Return in 3 months.      Relevant Medications   Semaglutide, 2 MG/DOSE, 8 MG/3ML SOPN   Other Relevant Orders   Bayer DCA Hb A1c Waived   Basic metabolic panel   Hyperlipidemia associated with type 2 diabetes mellitus (HCC)    Chronic, ongoing. Continue Rosuvastatin and fish oil, no issues with medication. Lipid panel today. Follow-up in 3 months.      Relevant Medications   Semaglutide, 2 MG/DOSE, 8  MG/3ML SOPN   Other Relevant Orders   Bayer DCA Hb A1c Waived   Lipid Panel w/o Chol/HDL Ratio     Other   Obesity    BMI 30.22 today.  Recommend heavy focus on exercise regularly and healthy diet choices.      Relevant Medications   Semaglutide, 2 MG/DOSE, 8 MG/3ML SOPN   NOTE WRITTEN BY DNP STUDENT.  ASSESSMENT AND PLAN OF CARE REVIEWED WITH STUDENT, AGREE WITH ABOVE FINDINGS AND PLAN.    Follow  up plan: Return in about 3 months (around 08/25/2021) for T2DM, HTN/HLD, Vit D, B12, OSTEOPENIA.

## 2021-05-26 NOTE — Assessment & Plan Note (Addendum)
Chronic, stable. BP on lower side today, to monitor at home closely and may adjust regimen if lower at home.  Continue current medication regimen and adjust as needed based on pressure.  Increase water intake at home and focus on DASH diet.  Obtain BMP today.  Return in 3 months, sooner if lower BP levels at home.

## 2021-05-26 NOTE — Assessment & Plan Note (Addendum)
Chronic, ongoing. Continue Rosuvastatin and fish oil, no issues with medication. Lipid panel today. Follow-up in 3 months.

## 2021-05-26 NOTE — Assessment & Plan Note (Signed)
BMI 30.22 today.  Recommend heavy focus on exercise regularly and healthy diet choices.

## 2021-05-26 NOTE — Progress Notes (Signed)
BP (!) 100/59   Pulse 73   Temp 98.3 F (36.8 C) (Oral)   Wt 165 lb 3.2 oz (74.9 kg)   LMP 07/13/2000 (Approximate)   SpO2 96%   BMI 30.22 kg/m    Subjective:    Patient ID: Charlene Juarez, female    DOB: 29-Jan-1951, 70 y.o.   MRN: [id number]  HPI: Charlene Juarez is a 70 y.o. female  Chief Complaint  Patient presents with   Diabetes    Patient states she was on Keto Diet for two weeks last month and states it caused some stomach issues and had to stop. Patient states she has noticed her sugar readings have been elevated and would like to discuss insulin. Patient declined having a recent diabetic eye exam due to the cost and not having insurance.    Hyperlipidemia   Hypertension   DIABETES Patient was on keto diet, stopped two weeks ago due to same being too restrictive and having abdominal cramping. A1c today 7.5%. Patient thinks increase in A1c is as a result of her sampling her baked products. Hypoglycemic episodes:no Polydipsia/polyuria: no Visual disturbance: no Chest pain: no Paresthesias: no Glucose Monitoring: no  Accucheck frequency: Not Checking  Fasting glucose:  Post prandial:  Evening:  Before meals: Taking Insulin?: no  Long acting insulin:  Short acting insulin: Blood Pressure Monitoring: yes Retinal Examination: Up to Date Foot Exam: Up to Date Diabetic Education: Completed Pneumovax: Up to Date Influenza: Up to Date Aspirin: no   HYPERTENSION / HYPERLIPIDEMIA Patient stated that three days ago she went out in her yard to help her son fix something. When she bent down her heart started palpitating, it quickly resolved, had no more episodes since. Thinks this happened due to outside being hot.  Satisfied with current treatment? yes Duration of hypertension: chronic BP monitoring frequency: yes BP range:  BP medication side effects: no Duration of hyperlipidemia: chronic Cholesterol medication side effects: no Cholesterol  supplements: fish oil Medication compliance: good compliance Aspirin: no Recent stressors: no Recurrent headaches: no Visual changes: no Palpitations: no Dyspnea: no Chest pain: no Lower extremity edema: no Dizzy/lightheaded: no  Relevant past medical, surgical, family and social history reviewed and updated as indicated. Interim medical history since our last visit reviewed. Allergies and medications reviewed and updated.  Review of Systems  Constitutional: Negative.   Eyes: Negative.   Respiratory: Negative.    Cardiovascular: Negative.   Gastrointestinal: Negative.   Endocrine: Negative.   Neurological: Negative.    Per HPI unless specifically indicated above     Objective:    BP (!) 100/59   Pulse 73   Temp 98.3 F (36.8 C) (Oral)   Wt 165 lb 3.2 oz (74.9 kg)   LMP 07/13/2000 (Approximate)   SpO2 96%   BMI 30.22 kg/m   Wt Readings from Last 3 Encounters:  05/26/21 165 lb 3.2 oz (74.9 kg)  02/12/21 160 lb 9.6 oz (72.8 kg)  11/17/20 172 lb 6.4 oz (78.2 kg)    Physical Exam Vitals and nursing note reviewed.  Constitutional:      General: She is awake. She is not in acute distress.    Appearance: She is well-developed and well-groomed. She is obese. She is not ill-appearing or toxic-appearing.  HENT:     Head: Normocephalic.  Eyes:     General: Lids are normal. No scleral icterus.    Pupils: Pupils are equal, round, and reactive to light.  Neck:  Vascular: No carotid bruit.  Cardiovascular:     Rate and Rhythm: Normal rate and regular rhythm.     Pulses: Normal pulses.     Heart sounds: Normal heart sounds. No murmur heard.   No gallop.  Pulmonary:     Effort: Pulmonary effort is normal. No accessory muscle usage or respiratory distress.     Breath sounds: Normal breath sounds.  Abdominal:     General: Bowel sounds are normal. There is no distension.     Palpations: Abdomen is soft.  Musculoskeletal:     Cervical back: Full passive range of motion  without pain.     Right lower leg: No edema.     Left lower leg: No edema.  Skin:    General: Skin is warm and dry.  Neurological:     Mental Status: She is alert.  Psychiatric:        Attention and Perception: Attention normal.        Mood and Affect: Mood normal.        Speech: Speech normal.        Behavior: Behavior is cooperative.        Thought Content: Thought content normal.    Results for orders placed or performed in visit on 02/12/21  Bayer DCA Hb A1c Waived  Result Value Ref Range   HB A1C (BAYER DCA - WAIVED) 7.0 (H) <7.0 %      Assessment & Plan:   Problem List Items Addressed This Visit       Cardiovascular and Mediastinum   Hypertension associated with diabetes ([place])    Chronic, stable. BP on lower side today, to monitor at home closely and may adjust regimen if lower at home.  Continue current medication regimen and adjust as needed based on pressure.  Increase water intake at home and focus on DASH diet.  Obtain BMP today.  Return in 3 months, sooner if lower BP levels at home.       Relevant Medications   Semaglutide, 2 MG/DOSE, 8 MG/3ML SOPN   Other Relevant Orders   Bayer DCA Hb A1c Waived   Basic metabolic panel     Endocrine   Type 2 diabetes mellitus with obesity (HCC) - Primary    Chronic, ongoing.  A1c trended up today at 7.5%, will increase ozempic to 2 mg, educated about eating a healthy diet. Continue remainder of medication regimen with goal of avoiding insulin.  Recommend she check BS daily fasting and document for provider visits.  Return in 3 months.      Relevant Medications   Semaglutide, 2 MG/DOSE, 8 MG/3ML SOPN   Other Relevant Orders   Bayer DCA Hb A1c Waived   Basic metabolic panel   Hyperlipidemia associated with type 2 diabetes mellitus (HCC)    Chronic, ongoing. Continue Rosuvastatin and fish oil, no issues with medication. Lipid panel today. Follow-up in 3 months.      Relevant Medications   Semaglutide, 2 MG/DOSE, 8  MG/3ML SOPN   Other Relevant Orders   Bayer DCA Hb A1c Waived   Lipid Panel w/o Chol/HDL Ratio     Other   Obesity    BMI 30.22 today.  Recommend heavy focus on exercise regularly and healthy diet choices.      Relevant Medications   Semaglutide, 2 MG/DOSE, 8 MG/3ML SOPN   NOTE WRITTEN BY DNP STUDENT.  ASSESSMENT AND PLAN OF CARE REVIEWED WITH STUDENT, AGREE WITH ABOVE FINDINGS AND PLAN.    Follow  up plan: Return in about 3 months (around 08/25/2021) for T2DM, HTN/HLD, Vit D, B12, OSTEOPENIA.

## 2021-05-26 NOTE — Assessment & Plan Note (Addendum)
Chronic, ongoing.  A1c trended up today at 7.5%, will increase ozempic to 2 mg, educated about eating a healthy diet. Continue remainder of medication regimen with goal of avoiding insulin.  Recommend she check BS daily fasting and document for provider visits.  Return in 3 months.

## 2021-05-27 LAB — BASIC METABOLIC PANEL
BUN/Creatinine Ratio: 20 (ref 12–28)
BUN: 14 mg/dL (ref 8–27)
CO2: 25 mmol/L (ref 20–29)
Calcium: 9.6 mg/dL (ref 8.7–10.3)
Chloride: 98 mmol/L (ref 96–106)
Creatinine, Ser: 0.7 mg/dL (ref 0.57–1.00)
Glucose: 146 mg/dL — ABNORMAL HIGH (ref 65–99)
Potassium: 4.2 mmol/L (ref 3.5–5.2)
Sodium: 138 mmol/L (ref 134–144)
eGFR: 94 mL/min/{1.73_m2} (ref >59)

## 2021-05-27 LAB — LIPID PANEL W/O CHOL/HDL RATIO
Cholesterol, Total: 110 mg/dL (ref 100–199)
HDL: 52 mg/dL (ref >39)
LDL Chol Calc (NIH): 36 mg/dL (ref 0–99)
Triglycerides: 122 mg/dL (ref 0–149)
VLDL Cholesterol Cal: 22 mg/dL (ref 5–40)

## 2021-05-27 NOTE — Progress Notes (Signed)
Contacted via [place] evening Mardene Celeste.  They continue to be stable.  Continue all current medications, you are doing great.   Keep being awesome!!  Thank you for allowing me to participate in your care.  I appreciate you. Kindest regards, Reola Buckles

## 2021-06-17 ENCOUNTER — Other Ambulatory Visit: Payer: Self-pay | Admitting: Nurse Practitioner

## 2021-06-17 NOTE — Telephone Encounter (Signed)
Original Rx-05/26/21 #30 [id number]- request 90 day Rx- granted #[id number]

## 2021-07-08 ENCOUNTER — Other Ambulatory Visit: Payer: Self-pay | Admitting: Nurse Practitioner

## 2021-07-08 DIAGNOSIS — Z1231 Encounter for screening mammogram for malignant neoplasm of breast: Secondary | ICD-10-CM

## 2021-07-21 ENCOUNTER — Other Ambulatory Visit: Payer: Self-pay | Admitting: Nurse Practitioner

## 2021-07-21 NOTE — Telephone Encounter (Signed)
Requested Prescriptions  Pending Prescriptions Disp Refills  . traZODone (DESYREL) 50 MG tablet [Pharmacy Med Name: TRAZODONE 50 MG TABLET] 90 tablet 0    Sig: TAKE 1/2 TO 1 TABLET BY MOUTH AT BEDTIME AS NEEDED FOR SLEEP     Psychiatry: Antidepressants - Serotonin Modulator Passed - 07/21/2021  9:15 PM      Passed - Valid encounter within last 6 months    Recent Outpatient Visits          1 month ago Type 2 diabetes mellitus with obesity ([place])   [facility] [place] [facility], Jolene T, NP   5 months ago Type 2 diabetes mellitus with obesity ([place])   [facility] [place], Jolene T, NP   8 months ago Type 2 diabetes mellitus with obesity ([place])   [facility], Jolene T, NP   11 months ago Type 2 diabetes mellitus with obesity ([place])   [facility] [place], Jolene T, NP   1 year ago Type 2 diabetes mellitus with obesity ([place])   [place], Barbaraann Faster, NP      Future Appointments            In 1 month [place], Barbaraann Faster, NP [facility]   In 3 months  [facility]

## 2021-08-03 ENCOUNTER — Other Ambulatory Visit: Payer: Self-pay

## 2021-08-03 ENCOUNTER — Ambulatory Visit
Admission: RE | Admit: 2021-08-03 | Discharge: 2021-08-03 | Disposition: A | Discharge status: Home / Self Care | Payer: Medicare Other | Source: Ambulatory Visit | Attending: Nurse Practitioner | Admitting: Nurse Practitioner

## 2021-08-03 DIAGNOSIS — Z1231 Encounter for screening mammogram for malignant neoplasm of breast: Secondary | ICD-10-CM | POA: Insufficient documentation

## 2021-08-03 NOTE — Progress Notes (Signed)
Contacted via MyChart   Normal mammogram, repeat in one year.

## 2021-08-06 ENCOUNTER — Other Ambulatory Visit: Payer: Self-pay | Admitting: Nurse Practitioner

## 2021-08-06 NOTE — Telephone Encounter (Signed)
Fyi.

## 2021-08-06 NOTE — Telephone Encounter (Signed)
Future OV 08/25/21. Medication last ordered last ordered 02/12/21 #90 with 4 refills. Patient should have enough medication to last until 01/2022. Not needed at this time-refused as requested too early

## 2021-08-06 NOTE — Telephone Encounter (Signed)
Rx refilled 02/12/2021 #90 with 4 refills. Pt should have enough medication to last until 01/2022.

## 2021-08-11 ENCOUNTER — Encounter: Payer: Self-pay | Admitting: Nurse Practitioner

## 2021-08-25 ENCOUNTER — Ambulatory Visit (INDEPENDENT_AMBULATORY_CARE_PROVIDER_SITE_OTHER): Payer: Medicare Other | Admitting: Nurse Practitioner

## 2021-08-25 ENCOUNTER — Other Ambulatory Visit: Payer: Self-pay

## 2021-08-25 ENCOUNTER — Encounter: Payer: Self-pay | Admitting: Nurse Practitioner

## 2021-08-25 VITALS — BP 108/68 | HR 66 | Temp 98.0°F | Wt 164.6 lb

## 2021-08-25 DIAGNOSIS — E538 Deficiency of other specified B group vitamins: Secondary | ICD-10-CM

## 2021-08-25 DIAGNOSIS — Z23 Encounter for immunization: Secondary | ICD-10-CM

## 2021-08-25 DIAGNOSIS — Z6830 Body mass index (BMI) 30.0-30.9, adult: Secondary | ICD-10-CM | POA: Diagnosis not present

## 2021-08-25 DIAGNOSIS — E669 Obesity, unspecified: Secondary | ICD-10-CM | POA: Diagnosis not present

## 2021-08-25 DIAGNOSIS — I152 Hypertension secondary to endocrine disorders: Secondary | ICD-10-CM | POA: Diagnosis not present

## 2021-08-25 DIAGNOSIS — E1169 Type 2 diabetes mellitus with other specified complication: Secondary | ICD-10-CM

## 2021-08-25 DIAGNOSIS — E785 Hyperlipidemia, unspecified: Secondary | ICD-10-CM | POA: Diagnosis not present

## 2021-08-25 DIAGNOSIS — E1159 Type 2 diabetes mellitus with other circulatory complications: Secondary | ICD-10-CM

## 2021-08-25 DIAGNOSIS — J4520 Mild intermittent asthma, uncomplicated: Secondary | ICD-10-CM

## 2021-08-25 DIAGNOSIS — E6609 Other obesity due to excess calories: Secondary | ICD-10-CM

## 2021-08-25 LAB — BAYER DCA HB A1C WAIVED: HB A1C (BAYER DCA - WAIVED): 7.5 % — ABNORMAL HIGH (ref 4.8–5.6)

## 2021-08-25 MED ORDER — ALBUTEROL SULFATE HFA 108 (90 BASE) MCG/ACT IN AERS: INHALATION_SPRAY | RESPIRATORY_TRACT | 2 refills | Status: DC

## 2021-08-25 NOTE — Patient Instructions (Signed)

## 2021-08-25 NOTE — Progress Notes (Signed)
BP 108/68   Pulse 66   Temp 98 F (36.7 C)   Wt 164 lb 9.6 oz (74.7 kg)   LMP 07/13/2000 (Approximate)   SpO2 96%   BMI 30.11 kg/m    Subjective:    Patient ID: Charlene Juarez, female    DOB: 1950/10/26, 70 y.o.   MRN: [id number]  HPI: Charlene Juarez is a 70 y.o. female  Chief Complaint  Patient presents with   Diabetes    Patient states she does not  have the money to have a recent Diabetic Eye Exam.    Hypertension   Hyperlipidemia   DIABETES Last A1c was 7.5% in September, elevated due to coming off Keto and eating more of her baked goods.  We increased her Ozempic to 2 MG.  Continues on Jardiance 25 MG, Metformin 1000 MG BID.  She endorses not trying like she should due to the Christmas season and Thanksgiving season - she does a lot of baking. Hypoglycemic episodes:no Polydipsia/polyuria: no Visual disturbance: no Chest pain: no Paresthesias: no Glucose Monitoring: no  Accucheck frequency: occasionally  Fasting glucose:  Post prandial:  Evening: 165  Before meals: Taking Insulin?: no  Long acting insulin:  Short acting insulin: Blood Pressure Monitoring: yes Retinal Examination: Not Up to Date -- does not have money  Foot Exam: Up to Date Pneumovax: Up to Date Influenza: Up to Date Aspirin: no   HYPERTENSION / HYPERLIPIDEMIA Continues on HCTZ, Crestor, Telmisartan + Fish Oil. Satisfied with current treatment? yes Duration of hypertension: chronic BP monitoring frequency: yes BP range:  BP medication side effects: no Duration of hyperlipidemia: chronic Cholesterol medication side effects: no Cholesterol supplements: fish oil Medication compliance: good compliance Aspirin: no Recent stressors: no Recurrent headaches: no Visual changes: no Palpitations: no Dyspnea: no Chest pain: no Lower extremity edema: no Dizzy/lightheaded: no  ASTHMA Continues on Symbicort as needed only, only takes it when sick.  Would like refills on  Ventolin. Asthma status: controlled Satisfied with current treatment?: yes Albuterol/rescue inhaler frequency: occasional Dyspnea frequency: none Wheezing frequency: none Cough frequency: none Nocturnal symptom frequency: none Limitation of activity: no Current upper respiratory symptoms: no Asthma meds in past: none Aerochamber/spacer use: no Visits to ER or Urgent Care in past year: no Pneumovax: Up to Date Influenza: Up to Date   Relevant past medical, surgical, family and social history reviewed and updated as indicated. Interim medical history since our last visit reviewed. Allergies and medications reviewed and updated.  Review of Systems  Constitutional: Negative.   Eyes: Negative.   Respiratory: Negative.    Cardiovascular: Negative.   Gastrointestinal: Negative.   Endocrine: Negative.   Neurological: Negative.    Per HPI unless specifically indicated above     Objective:    BP 108/68   Pulse 66   Temp 98 F (36.7 C)   Wt 164 lb 9.6 oz (74.7 kg)   LMP 07/13/2000 (Approximate)   SpO2 96%   BMI 30.11 kg/m   Wt Readings from Last 3 Encounters:  08/25/21 164 lb 9.6 oz (74.7 kg)  05/26/21 165 lb 3.2 oz (74.9 kg)  02/12/21 160 lb 9.6 oz (72.8 kg)    Physical Exam Vitals and nursing note reviewed.  Constitutional:      General: She is awake. She is not in acute distress.    Appearance: She is well-developed and well-groomed. She is obese. She is not ill-appearing or toxic-appearing.  HENT:     Head: Normocephalic.  Eyes:  General: Lids are normal. No scleral icterus.    Pupils: Pupils are equal, round, and reactive to light.  Neck:     Vascular: No carotid bruit.  Cardiovascular:     Rate and Rhythm: Normal rate and regular rhythm.     Pulses: Normal pulses.     Heart sounds: Normal heart sounds. No murmur heard.   No gallop.  Pulmonary:     Effort: Pulmonary effort is normal. No accessory muscle usage or respiratory distress.     Breath sounds:  Normal breath sounds.  Abdominal:     General: Bowel sounds are normal. There is no distension.     Palpations: Abdomen is soft.  Musculoskeletal:     Cervical back: Full passive range of motion without pain.     Right lower leg: No edema.     Left lower leg: No edema.  Skin:    General: Skin is warm and dry.  Neurological:     Mental Status: She is alert.  Psychiatric:        Attention and Perception: Attention normal.        Mood and Affect: Mood normal.        Speech: Speech normal.        Behavior: Behavior is cooperative.        Thought Content: Thought content normal.    Results for orders placed or performed in visit on 05/26/21  Bayer DCA Hb A1c Waived  Result Value Ref Range   HB A1C (BAYER DCA - WAIVED) 7.5 (H) 4.8 - 5.6 %  Basic metabolic panel  Result Value Ref Range   Glucose 146 (H) 65 - 99 mg/dL   BUN 14 8 - 27 mg/dL   Creatinine, Ser 0.70 0.57 - 1.00 mg/dL   eGFR 94 >59 mL/min/1.73   BUN/Creatinine Ratio 20 12 - 28   Sodium 138 134 - 144 mmol/L   Potassium 4.2 3.5 - 5.2 mmol/L   Chloride 98 96 - 106 mmol/L   CO2 25 20 - 29 mmol/L   Calcium 9.6 8.7 - 10.3 mg/dL  Lipid Panel w/o Chol/HDL Ratio  Result Value Ref Range   Cholesterol, Total 110 100 - 199 mg/dL   Triglycerides 122 0 - 149 mg/dL   HDL 52 >39 mg/dL   VLDL Cholesterol Cal 22 5 - 40 mg/dL   LDL Chol Calc (NIH) 36 0 - 99 mg/dL      Assessment & Plan:   Problem List Items Addressed This Visit       Cardiovascular and Mediastinum   Hypertension associated with diabetes ([place])    Chronic, stable with BP at goal in office and at home.  Continue current medication regimen, Micardis for kidney protection, and adjust as needed.  CBC and TSH today.  Recommend she continue to check BP at home on occasion and document for visits. Focus on DASH diet.  Return in 3 months.      Relevant Orders   Bayer DCA Hb A1c Waived   CBC with Differential/Platelet   TSH     Respiratory   Asthma, intermittent     Chronic, stable.  Minimal inhaler use.  Continue to monitor.      Relevant Medications   albuterol (VENTOLIN HFA) 108 (90 Base) MCG/ACT inhaler   Other Relevant Orders   CBC with Differential/Platelet     Endocrine   Hyperlipidemia associated with type 2 diabetes mellitus (HCC)    Chronic, ongoing.  Will continue Rosuvastatin daily and adjust  dose as needed.  Return to office in 3 months.  Recent LDL 36.      Relevant Orders   Bayer DCA Hb A1c Waived   Type 2 diabetes mellitus with obesity ([place]) - Primary    Chronic, ongoing.  A1c today remains 7.5%, will continue current medication regimen at this time as is on max dose of 3 medications, and recommend heavy focus on diet changes after the holiday season is over.  She agrees with this plan, goal to avoid insulin need.  Could consider change to [facility].  Monitor blood sugar twice a day.  Obtain eye exam.  Return in 3 months.      Relevant Orders   Bayer DCA Hb A1c Waived     Other   B12 deficiency    Chronic, stable.  Recheck B12 level today and continue supplement.      Relevant Orders   Vitamin B12   Obesity    BMI 30.11.  Recommended eating smaller high protein, low fat meals more frequently and exercising 30 mins a day 5 times a week with a goal of 10-15lb weight loss in the next 3 months. Patient voiced their understanding and motivation to adhere to these recommendations.       Other Visit Diagnoses     Flu vaccine need       High dose flu vaccine today.   Relevant Orders   Flu Vaccine QUAD High Dose(Fluad)      NOTE WRITTEN BY DNP STUDENT.  ASSESSMENT AND PLAN OF CARE REVIEWED WITH STUDENT, AGREE WITH ABOVE FINDINGS AND PLAN.    Follow up plan: Return in about 3 months (around 11/23/2021) for T2DM, HTN/HLD, ASTHMA, OSTEOPENIA.

## 2021-08-25 NOTE — Assessment & Plan Note (Signed)
Chronic, stable with BP at goal in office and at home.  Continue current medication regimen, Micardis for kidney protection, and adjust as needed.  CBC and TSH today.  Recommend she continue to check BP at home on occasion and document for visits. Focus on DASH diet.  Return in 3 months.

## 2021-08-25 NOTE — Assessment & Plan Note (Signed)
Chronic, ongoing.  Will continue Rosuvastatin daily and adjust dose as needed.  Return to office in 3 months.  Recent LDL 36.

## 2021-08-25 NOTE — Assessment & Plan Note (Signed)
Chronic, stable.  Recheck B12 level today and continue supplement. 

## 2021-08-25 NOTE — Assessment & Plan Note (Signed)
Chronic, stable.  Minimal inhaler use.  Continue to monitor.

## 2021-08-25 NOTE — Assessment & Plan Note (Signed)
BMI 30.11.  Recommended eating smaller high protein, low fat meals more frequently and exercising 30 mins a day 5 times a week with a goal of 10-15lb weight loss in the next 3 months. Patient voiced their understanding and motivation to adhere to these recommendations.

## 2021-08-25 NOTE — Assessment & Plan Note (Addendum)
Chronic, ongoing.  A1c today remains 7.5%, will continue current medication regimen at this time as is on max dose of 3 medications, and recommend heavy focus on diet changes after the holiday season is over.  She agrees with this plan, goal to avoid insulin need.  Could consider change to [facility].  Monitor blood sugar twice a day.  Obtain eye exam.  Return in 3 months.

## 2021-08-26 LAB — CBC WITH DIFFERENTIAL/PLATELET
Basophils Absolute: 0 10*3/uL (ref 0.0–0.2)
Basos: 0 %
EOS (ABSOLUTE): 0.1 10*3/uL (ref 0.0–0.4)
Eos: 2 %
Hematocrit: 43.8 % (ref 34.0–46.6)
Hemoglobin: 14.3 g/dL (ref 11.1–15.9)
Immature Grans (Abs): 0 10*3/uL (ref 0.0–0.1)
Immature Granulocytes: 0 %
Lymphocytes Absolute: 2.3 10*3/uL (ref 0.7–3.1)
Lymphs: 37 %
MCH: 29.5 pg (ref 26.6–33.0)
MCHC: 32.6 g/dL (ref 31.5–35.7)
MCV: 91 fL (ref 79–97)
Monocytes Absolute: 0.5 10*3/uL (ref 0.1–0.9)
Monocytes: 8 %
Neutrophils Absolute: 3.3 10*3/uL (ref 1.4–7.0)
Neutrophils: 53 %
Platelets: 215 10*3/uL (ref 150–450)
RBC: 4.84 x10E6/uL (ref 3.77–5.28)
RDW: 12.3 % (ref 11.7–15.4)
WBC: 6.2 10*3/uL (ref 3.4–10.8)

## 2021-08-26 LAB — TSH: TSH: 3.09 u[IU]/mL (ref 0.450–4.500)

## 2021-08-26 LAB — VITAMIN B12: Vitamin B-12: 603 pg/mL (ref 232–1245)

## 2021-08-26 NOTE — Progress Notes (Signed)
Contacted via [place] morning Madal[place], your labs have returned and all is normal.  Continue all current medications.  Have a wonderful day!! Keep being amazing!!  Thank you for allowing me to participate in your care.  I appreciate you. Kindest regards, Maryfer Tauzin

## 2021-10-19 ENCOUNTER — Ambulatory Visit: Payer: Self-pay

## 2021-10-19 NOTE — Telephone Encounter (Signed)
.  Chief Complaint: acid buildup, upper abdominal discomfort Symptoms: pain under breast over heart, vomiting, sob at times Frequency: Ongoing for several weeks, mostly happens when lying down at night, acid comes up into throat  Pertinent Negatives: Patient denies CP at this time, no other symptoms. Disposition: [] ED /[] Urgent Care (no appt availability in office) / [x] Appointment(In office/virtual)/ []  [facility]/ [] Home Care/ [] Refused Recommended Disposition /[] [facility]/ []  Follow-up with PCP Additional Notes: N/A   Reason for Disposition  [1] Abdominal pain is intermittent AND [2] shoots into chest, with sour taste in mouth  Answer Assessment - Initial Assessment Questions 1. LOCATION: "Where does it hurt?"      Sore around where the heart is located 2. RADIATION: "Does the pain shoot anywhere else?" (e.g., chest, back)     Up into chest and throat 3. ONSET: "When did the pain begin?" (e.g., minutes, hours or days ago)      Ongoing for weeks at night when in bed 4. SUDDEN: "Gradual or sudden onset?"     N/A 5. PATTERN "Does the pain come and go, or is it constant?"    - If constant: "Is it getting better, staying the same, or worsening?"      (Note: Constant means the pain never goes away completely; most serious pain is constant and it progresses)     - If intermittent: "How long does it last?" "Do you have pain now?"     (Note: Intermittent means the pain goes away completely between bouts)     Comes and goes 6. SEVERITY: "How bad is the pain?"  (e.g., Scale 1-10; mild, moderate, or severe)    - MILD (1-3): doesn't interfere with normal activities, abdomen soft and not tender to touch     - MODERATE (4-7): interferes with normal activities or awakens from sleep, abdomen tender to touch     - SEVERE (8-10): excruciating pain, doubled over, unable to do any normal activities       7-8 discomfort 7. RECURRENT SYMPTOM: "Have you ever had this type of  stomach pain before?" If Yes, ask: "When was the last time?" and "What happened that time?"      No until recently 8. AGGRAVATING FACTORS: "Does anything seem to cause this pain?" (e.g., foods, stress, alcohol)     Foods, coffee 9. CARDIAC SYMPTOMS: "Do you have any of the following symptoms: chest pain, difficulty breathing, sweating, nausea?"     SOB 10. OTHER SYMPTOMS: "Do you have any other symptoms?" (e.g., back pain, diarrhea, fever, urination pain, vomiting)       Vomiting  Protocols used: Abdominal Pain - Upper-A-AH

## 2021-10-23 ENCOUNTER — Ambulatory Visit (INDEPENDENT_AMBULATORY_CARE_PROVIDER_SITE_OTHER): Payer: Medicare Other | Admitting: Nurse Practitioner

## 2021-10-23 ENCOUNTER — Telehealth: Payer: Self-pay

## 2021-10-23 ENCOUNTER — Other Ambulatory Visit: Payer: Self-pay

## 2021-10-23 ENCOUNTER — Encounter: Payer: Self-pay | Admitting: Nurse Practitioner

## 2021-10-23 VITALS — BP 121/74 | HR 66 | Temp 98.1°F | Ht 62.0 in | Wt 164.8 lb

## 2021-10-23 DIAGNOSIS — E6609 Other obesity due to excess calories: Secondary | ICD-10-CM | POA: Diagnosis not present

## 2021-10-23 DIAGNOSIS — R002 Palpitations: Secondary | ICD-10-CM | POA: Insufficient documentation

## 2021-10-23 DIAGNOSIS — K219 Gastro-esophageal reflux disease without esophagitis: Secondary | ICD-10-CM

## 2021-10-23 DIAGNOSIS — Z6830 Body mass index (BMI) 30.0-30.9, adult: Secondary | ICD-10-CM

## 2021-10-23 MED ORDER — PANTOPRAZOLE SODIUM 20 MG PO TBEC: 20.0000 mg | DELAYED_RELEASE_TABLET | Freq: Every day | ORAL | 12 refills | Status: DC

## 2021-10-23 NOTE — Assessment & Plan Note (Addendum)
Suspect some underlying SVT, this is reported by patient too.  No past cardiology notes to review at this time.  Recent episode one week ago during severe heart burn, no episodes since.  EKG reassuring in office today.  ?possibly related to recent heart burn episode, she was able to improve on own with coughing.  Will monitor, if recurrent episodes may need to start BB and get into cardiology.

## 2021-10-23 NOTE — Telephone Encounter (Signed)
SCHEDULED FOR 01/04/2022

## 2021-10-23 NOTE — Patient Instructions (Signed)
Heartburn Heartburn is a type of pain or discomfort that can happen in your throat or chest. It is often described as a burning pain. It may also cause a bad, acid-like taste in your mouth. It may be caused by stomach contents that move back up (reflux) into the part of the body that moves food from your mouth to your stomach (esophagus). Heartburn may feel worse: When you lie down. When you bend over. At night. Follow these instructions at home: Eating and drinking  Avoid certain foods and drinks as told by your doctor. This may include: Coffee and tea, with or without caffeine. Drinks that have alcohol. Energy drinks and sports drinks. Carbonated drinks or sodas. Chocolate and cocoa. Peppermint and mint flavorings. Garlic and onions. Horseradish. Spicy and acidic foods, such as: Peppers. Chili powder and curry powder. Vinegar. Hot sauces and BBQ sauce. Citrus fruit juices and citrus fruits, such as: Oranges. Lemons. Limes. Tomato-based foods, such as: Red sauce and pizza with red sauce. Chili. Salsa. Fried and fatty foods, such as: Donuts. [place] fries and potato chips. High-fat dressings. High-fat meats, such as: Hot dogs and sausage. Rib eye steak. Ham and bacon. High-fat dairy items, such as: Whole milk. Butter. Cream cheese. Eat small meals often. Avoid eating large meals. Avoid drinking large amounts of liquid with your meals. Avoid eating meals during the 2-3 hours before bedtime. Avoid lying down right after you eat. Do not exercise right after you eat. Lifestyle   If you are overweight, lose an amount of weight that is healthy for you. Ask your doctor about a safe weight loss goal. Do not smoke or use any products that contain nicotine or tobacco. These can make your symptoms worse. If you need help quitting, ask your doctor. Wear loose clothes. Do not wear anything tight around your waist. Raise (elevate) the head of your bed about 6 inches (15 cm) when  you sleep. You can use a wedge to do this. Try to lower your stress. If you need help doing this, ask your doctor. Medicines Take over-the-counter and prescription medicines only as told by your doctor. Do not take aspirin or NSAIDs, such as ibuprofen, unless your doctor says it is okay. Stop medicines only as told by your doctor. If you stop taking some medicines too quickly, your symptoms may get worse. General instructions Watch for any changes in your symptoms. Keep all follow-up visits. Contact a doctor if: You have new symptoms. You lose weight and you do not know why. You have trouble swallowing, or it hurts to swallow. You have wheezing or a cough that keeps happening. Your symptoms do not get better with treatment. You have heartburn often for more than 2 weeks. Get help right away if: You have pain in your arms, neck, jaw, teeth, or back all of a sudden. You feel sweaty, dizzy, or light-headed all of a sudden. You have chest pain or shortness of breath. You vomit and your vomit looks like blood or coffee grounds. Your poop (stool) is bloody or black. These symptoms may be an emergency. Get help right away. Call your local emergency services (911 in the [place] Do not wait to see if the symptoms will go away. Do not drive yourself to the hospital. Summary Heartburn is a type of pain that can happen in your throat or chest. It can feel like a burning pain. It may also cause a bad, acid-like taste in your mouth. You may need to avoid certain foods and  drinks to help your symptoms. Ask your doctor what foods and drinks you should avoid. Take over-the-counter and prescription medicines only as told by your doctor. Do not take aspirin or NSAIDs, such as ibuprofen, unless your doctor told you to do so. Contact your doctor if your symptoms do not get better or they get worse. This information is not intended to replace advice given to you by your health care provider. Make sure you  discuss any questions you have with your health care provider. Document Revised: 03/12/2020 Document Reviewed: 03/12/2020 Elsevier Patient Education  [place].

## 2021-10-23 NOTE — Assessment & Plan Note (Signed)
BMI 30.14 today.  Recommended eating smaller high protein, low fat meals more frequently and exercising 30 mins a day 5 times a week with a goal of 10-15lb weight loss in the next 3 months. Patient voiced their understanding and motivation to adhere to these recommendations.

## 2021-10-23 NOTE — Progress Notes (Signed)
BP 121/74    Pulse 66    Temp 98.1 F (36.7 C) (Oral)    Ht 5\' 2"  (1.575 m)    Wt 164 lb 12.8 oz (74.8 kg)    LMP 07/13/2000 (Approximate)    SpO2 98%    BMI 30.14 kg/m    Subjective:    Patient ID: Charlene Juarez, female    DOB: 1951/03/08, 71 y.o.   MRN: [id number]  HPI: Charlene Juarez is a 71 y.o. female  Chief Complaint  Patient presents with   Abdominal Pain   Heartburn    Patient states since she was 20 she has had heart issues and it was work-related and she has been hospitalized for heart issues. Patient states she was close to an heart attack. Patient states every since then she when she gets stressed and have a bowel movement she notices a lot of acid build up. Patient states she vomits at night and in the mornings she coughs up a lot of phlegm. Patient thinks she may have a hiatal hernia. Patient states the area budges when she lies down and sits up a bit.    Referral    Patient states she would like to discuss a referral for GI for an upper endoscopy procedure to check to see if she has a hernia and then to check her esophagus. Patient would like to discuss with provider at today's visit.    GERD Currently not taking medication for this, she is trying OTC Mylanta.  Is taking Ozempic, has been on this for over a year now with benefit to diabetes and weight loss -- occasionally takes Zofran for nausea with this.  The heart burn has been going on for some time, but the past two months has had more emesis and nausea with this.  This issue became more prominent after started Ozempic -- she prefers not to stop this.    Does feel like an area bulges when she lies down, she feels she has a hiatal hernia and would like to see GI provider. GERD control status: uncontrolled Satisfied with current treatment? no Heartburn frequency: if eats past 2 pm every day Medication side effects: no  Medication compliance: worse Previous GERD medications: Mylanta Antacid use  frequency:  none Duration: 2 months Nature: burning in epigastric area Location: epigastric area Heartburn duration: 30 minutes to several hours Alleviatiating factors:  Mylanta Aggravating factors: citrus, spicy foods Dysphagia:  occasional Odynophagia:  no Hematemesis: no Blood in stool: no EGD: no   PALPITATIONS Recently, last week, noted some palpitations during episode of severe heart burn -- episode of rapid heart beat.  She reports she coughs with this and it stops, was told SVT in past.  Has history of heart issues at age 32, was working too much and taking Fen-Phen -- had episode of going into hospital for heart. Duration:  one week ago Symptom description: rapid heart rate Duration of episode: minutes = 10 minutes Frequency: history of in past several years ago Activity when event occurred: severe heart burn Related to exertion: no Dyspnea: yes Chest pain: sore around her heart at time, although had heart burn too Syncope: no Anxiety/stress: no Nausea/vomiting: no Diaphoresis: no Coronary artery disease: no Congestive heart failure: no Arrhythmia: SVT in past she went to cardiology per her report at age 82 Thyroid disease: no Caffeine intake: none Status:  stable Treatments attempted: coughed    Relevant past medical, surgical, family and social history reviewed and updated  as indicated. Interim medical history since our last visit reviewed. Allergies and medications reviewed and updated.  Review of Systems  Constitutional: Negative.  Negative for activity change, appetite change, diaphoresis, fatigue and fever.  Eyes: Negative.   Respiratory: Negative.  Negative for cough, chest tightness, shortness of breath and wheezing.   Cardiovascular:  Positive for palpitations. Negative for chest pain and leg swelling.  Gastrointestinal:  Positive for abdominal distention and abdominal pain. Negative for constipation, diarrhea, nausea and vomiting.  Endocrine: Negative.   Negative for cold intolerance, heat intolerance, polydipsia, polyphagia and polyuria.  Neurological: Negative.   Psychiatric/Behavioral: Negative.     Per HPI unless specifically indicated above     Objective:    BP 121/74    Pulse 66    Temp 98.1 F (36.7 C) (Oral)    Ht 5\' 2"  (1.575 m)    Wt 164 lb 12.8 oz (74.8 kg)    LMP 07/13/2000 (Approximate)    SpO2 98%    BMI 30.14 kg/m   Wt Readings from Last 3 Encounters:  10/23/21 164 lb 12.8 oz (74.8 kg)  08/25/21 164 lb 9.6 oz (74.7 kg)  05/26/21 165 lb 3.2 oz (74.9 kg)    Physical Exam Vitals and nursing note reviewed.  Constitutional:      General: She is awake. She is not in acute distress.    Appearance: She is well-developed and well-groomed. She is obese. She is not ill-appearing or toxic-appearing.  HENT:     Head: Normocephalic.  Eyes:     General: Lids are normal. No scleral icterus.    Pupils: Pupils are equal, round, and reactive to light.  Neck:     Thyroid: No thyromegaly.     Vascular: No carotid bruit.  Cardiovascular:     Rate and Rhythm: Normal rate and regular rhythm.     Pulses: Normal pulses.     Heart sounds: Normal heart sounds. No murmur heard.   No gallop.  Pulmonary:     Effort: Pulmonary effort is normal. No accessory muscle usage or respiratory distress.     Breath sounds: Normal breath sounds.  Abdominal:     General: Bowel sounds are normal. There is no distension.     Palpations: Abdomen is soft. There is no mass.     Tenderness: There is abdominal tenderness in the epigastric area. There is no right CVA tenderness or left CVA tenderness.     Hernia: A hernia is present. Hernia is present in the ventral area.  Musculoskeletal:     Cervical back: Full passive range of motion without pain.     Right lower leg: No edema.     Left lower leg: No edema.  Lymphadenopathy:     Cervical: No cervical adenopathy.  Skin:    General: Skin is warm and dry.  Neurological:     Mental Status: She is  alert.  Psychiatric:        Attention and Perception: Attention normal.        Mood and Affect: Mood normal.        Speech: Speech normal.        Behavior: Behavior is cooperative.        Thought Content: Thought content normal.   EKG My review and personal interpretation at Time: 1000  Indication: palpitations Rate: 80  Rhythm: sinus Axis: normal Other: No nonspecific st abn, no stemi, no lvh -- some artifact present  Results for orders placed or performed in visit on  08/25/21  Bayer DCA Hb A1c Waived  Result Value Ref Range   HB A1C (BAYER DCA - WAIVED) 7.5 (H) 4.8 - 5.6 %  CBC with Differential/Platelet  Result Value Ref Range   WBC 6.2 3.4 - 10.8 x10E3/uL   RBC 4.84 3.77 - 5.28 x10E6/uL   Hemoglobin 14.3 11.1 - 15.9 g/dL   Hematocrit 43.8 34.0 - 46.6 %   MCV 91 79 - 97 fL   MCH 29.5 26.6 - 33.0 pg   MCHC 32.6 31.5 - 35.7 g/dL   RDW 12.3 11.7 - 15.4 %   Platelets 215 150 - 450 x10E3/uL   Neutrophils 53 Not Estab. %   Lymphs 37 Not Estab. %   Monocytes 8 Not Estab. %   Eos 2 Not Estab. %   Basos 0 Not Estab. %   Neutrophils Absolute 3.3 1.4 - 7.0 x10E3/uL   Lymphocytes Absolute 2.3 0.7 - 3.1 x10E3/uL   Monocytes Absolute 0.5 0.1 - 0.9 x10E3/uL   EOS (ABSOLUTE) 0.1 0.0 - 0.4 x10E3/uL   Basophils Absolute 0.0 0.0 - 0.2 x10E3/uL   Immature Granulocytes 0 Not Estab. %   Immature Grans (Abs) 0.0 0.0 - 0.1 x10E3/uL  TSH  Result Value Ref Range   TSH 3.090 0.450 - 4.500 uIU/mL  Vitamin B12  Result Value Ref Range   Vitamin B-12 603 232 - 1,245 pg/mL      Assessment & Plan:   Problem List Items Addressed This Visit       Digestive   GERD (gastroesophageal reflux disease) - Primary    Chronic with poor control at this time and worsening.  Will get her into GI for further evaluation and possible EGD, discussed with patient.  Start Protonix 20 MG daily and adjust as needed, educated her on this medication and use.  Continue Zofran as needed for nausea.  ?related to  [place], she prefers not to stop this due to the benefit she has had from it, but discussed with her concern that it may be worsening symptoms.  Labs today.      Relevant Medications   pantoprazole (PROTONIX) 20 MG tablet   Other Relevant Orders   Ambulatory referral to Gastroenterology   Comprehensive metabolic panel   TSH   Magnesium     Other   Obesity    BMI 30.14 today.  Recommended eating smaller high protein, low fat meals more frequently and exercising 30 mins a day 5 times a week with a goal of 10-15lb weight loss in the next 3 months. Patient voiced their understanding and motivation to adhere to these recommendations.       Palpitations    Suspect some underlying SVT, this is reported by patient too.  No past cardiology notes to review at this time.  Recent episode one week ago during severe heart burn, no episodes since.  EKG reassuring in office today.  ?possibly related to recent heart burn episode, she was able to improve on own with coughing.  Will monitor, if recurrent episodes may need to start BB and get into cardiology.        Relevant Orders   EKG 12-Lead (Completed)   TSH   CBC with Differential/Platelet    Follow up plan: Return for as scheduled 11/23/21 for diabetes check.

## 2021-10-23 NOTE — Assessment & Plan Note (Addendum)
Chronic with poor control at this time and worsening.  Will get her into GI for further evaluation and possible EGD, discussed with patient.  Start Protonix 20 MG daily and adjust as needed, educated her on this medication and use.  Continue Zofran as needed for nausea.  ?related to [place], she prefers not to stop this due to the benefit she has had from it, but discussed with her concern that it may be worsening symptoms.  Labs today.

## 2021-10-24 LAB — CBC WITH DIFFERENTIAL/PLATELET
Basophils Absolute: 0 10*3/uL (ref 0.0–0.2)
Basos: 1 %
EOS (ABSOLUTE): 0.1 10*3/uL (ref 0.0–0.4)
Eos: 1 %
Hematocrit: 46.4 % (ref 34.0–46.6)
Hemoglobin: 15.1 g/dL (ref 11.1–15.9)
Immature Grans (Abs): 0 10*3/uL (ref 0.0–0.1)
Immature Granulocytes: 1 %
Lymphocytes Absolute: 2.4 10*3/uL (ref 0.7–3.1)
Lymphs: 32 %
MCH: 28.8 pg (ref 26.6–33.0)
MCHC: 32.5 g/dL (ref 31.5–35.7)
MCV: 88 fL (ref 79–97)
Monocytes Absolute: 0.6 10*3/uL (ref 0.1–0.9)
Monocytes: 8 %
Neutrophils Absolute: 4.4 10*3/uL (ref 1.4–7.0)
Neutrophils: 57 %
Platelets: 241 10*3/uL (ref 150–450)
RBC: 5.25 x10E6/uL (ref 3.77–5.28)
RDW: 12.1 % (ref 11.7–15.4)
WBC: 7.6 10*3/uL (ref 3.4–10.8)

## 2021-10-24 LAB — COMPREHENSIVE METABOLIC PANEL
ALT: 71 IU/L — ABNORMAL HIGH (ref 0–32)
AST: 35 IU/L (ref 0–40)
Albumin/Globulin Ratio: 2.1 (ref 1.2–2.2)
Albumin: 4.8 g/dL (ref 3.8–4.8)
Alkaline Phosphatase: 60 IU/L (ref 44–121)
BUN/Creatinine Ratio: 26 (ref 12–28)
BUN: 18 mg/dL (ref 8–27)
Bilirubin Total: 0.4 mg/dL (ref 0.0–1.2)
CO2: 25 mmol/L (ref 20–29)
Calcium: 9.4 mg/dL (ref 8.7–10.3)
Chloride: 95 mmol/L — ABNORMAL LOW (ref 96–106)
Creatinine, Ser: 0.68 mg/dL (ref 0.57–1.00)
Globulin, Total: 2.3 g/dL (ref 1.5–4.5)
Glucose: 140 mg/dL — ABNORMAL HIGH (ref 70–99)
Potassium: 4.1 mmol/L (ref 3.5–5.2)
Sodium: 136 mmol/L (ref 134–144)
Total Protein: 7.1 g/dL (ref 6.0–8.5)
eGFR: 94 mL/min/{1.73_m2} (ref >59)

## 2021-10-24 LAB — MAGNESIUM: Magnesium: 2.3 mg/dL (ref 1.6–2.3)

## 2021-10-24 LAB — TSH: TSH: 2.32 u[IU]/mL (ref 0.450–4.500)

## 2021-10-24 NOTE — Progress Notes (Signed)
Contacted via [place] morning [place], your labs have returned: - Kidney function, creatinine and eGFR, remains normal.  Liver function shows mild elevation in ALT, which has been seen past labs -- we will continue to monitor. - Remainder of labs are all within normal range.  Any questions? Keep being amazing!!  Thank you for allowing me to participate in your care.  I appreciate you. Kindest regards, Jessia Kief

## 2021-11-12 ENCOUNTER — Other Ambulatory Visit: Payer: Self-pay | Admitting: Nurse Practitioner

## 2021-11-12 NOTE — Telephone Encounter (Signed)
Refilled 02/12/2021 #90 with 4 refills - 1 year supply. Requested Prescriptions  Pending Prescriptions Disp Refills   JARDIANCE 25 MG TABS tablet [Pharmacy Med Name: JARDIANCE 25 MG TABLET] 90 tablet 4    Sig: TAKE 1 TABLET BY MOUTH EVERY DAY     Endocrinology:  Diabetes - SGLT2 Inhibitors Passed - 11/12/2021  1:45 AM      Passed - Cr in normal range and within 360 days    Creatinine, Ser  Date Value Ref Range Status  10/23/2021 0.68 0.57 - 1.00 mg/dL Final         Passed - HBA1C is between 0 and 7.9 and within 180 days    HB A1C (BAYER DCA - WAIVED)  Date Value Ref Range Status  08/25/2021 7.5 (H) 4.8 - 5.6 % Final    Comment:             Prediabetes: 5.7 - 6.4          Diabetes: >6.4          Glycemic control for adults with diabetes: <7.0          Passed - eGFR in normal range and within 360 days    GFR calc Af Amer  Date Value Ref Range Status  05/08/2020 95 >59 mL/min/1.73 Final    Comment:    **[facility] currently reports eGFR in compliance with the current**   recommendations of the [employer]. [facility] will   update reporting as new guidelines are published from the NKF-ASN   Task force.    GFR calc non Af Amer  Date Value Ref Range Status  05/08/2020 82 >59 mL/min/1.73 Final   eGFR  Date Value Ref Range Status  10/23/2021 94 >59 mL/min/1.73 Final         Passed - Valid encounter within last 6 months    Recent Outpatient Visits          2 weeks ago Gastroesophageal reflux disease without esophagitis   [facility] [place], Jolene T, NP   2 months ago Type 2 diabetes mellitus with obesity ([place])   [place] [facility], Jolene T, NP   5 months ago Type 2 diabetes mellitus with obesity ([place])   [place], Jolene T, NP   9 months ago Type 2 diabetes mellitus with obesity ([place])   [place], Jolene T, NP   12 months ago Type 2 diabetes mellitus with obesity ([place])   [place], Barbaraann Faster, NP      Future Appointments            In 5 days  [facility] [place]   In 1 week [place], Barbaraann Faster, NP [facility]

## 2021-11-16 ENCOUNTER — Ambulatory Visit: Payer: Medicare Other

## 2021-11-17 ENCOUNTER — Ambulatory Visit (INDEPENDENT_AMBULATORY_CARE_PROVIDER_SITE_OTHER): Payer: Medicare Other | Admitting: *Deleted

## 2021-11-17 DIAGNOSIS — Z0000 Encounter for general adult medical examination without abnormal findings: Secondary | ICD-10-CM

## 2021-11-17 NOTE — Progress Notes (Signed)
Subjective:   Ravin Bendall is a 71 y.o. female who presents for Medicare Annual (Subsequent) preventive examination.  I connected with  Shirell Struthers on 11/17/21 by a telephone enabled telemedicine application and verified that I am speaking with the correct person using two identifiers.   I discussed the limitations of evaluation and management by telemedicine. The patient expressed understanding and agreed to proceed.  Patient location: home  Provider location:  tele-health not in office    Review of Systems     Cardiac Risk Factors include: advanced age (>23men, >35 women);diabetes mellitus;hypertension     Objective:    Today's Vitals   11/17/21 1202  PainSc: 0-No pain   There is no height or weight on file to calculate BMI.  Advanced Directives 11/17/2021 11/17/2021 11/14/2020 10/31/2019 10/19/2018 10/14/2017 01/13/2017  Does Patient Have a Medical Advance Directive? No No Yes Yes Yes Yes Yes  Type of Advance Directive - - Living will Living will;Healthcare Power of [place];Living will Living will Living will  Does patient want to make changes to medical advance directive? - - - - - - No - Patient declined  Copy of [place] in Chart? - - - No - copy requested No - copy requested - -  Would patient like information on creating a medical advance directive? No - Patient declined No - Patient declined - - - - -    Current Medications (verified) Outpatient Encounter Medications as of 11/17/2021  Medication Sig   albuterol (VENTOLIN HFA) 108 (90 Base) MCG/ACT inhaler TAKE 2 PUFFS BY MOUTH EVERY 6 HOURS AS NEEDED FOR WHEEZE OR SHORTNESS OF BREATH   Ascorbic Acid (VITAMIN C) 1000 MG tablet Take 1,000 mg by mouth daily.   Cholecalciferol (VITAMIN D3) 1000 units CAPS Take 1,000 Units by mouth at bedtime.   empagliflozin (JARDIANCE) 25 MG TABS tablet Take 1 tablet (25 mg total) by mouth daily.   Ginkgo Biloba 40  MG TABS Take 1 tablet by mouth daily.   glucose blood (BAYER CONTOUR TEST) test strip Contour test strips ( or insurance preferred) used to check blood sugar BID. DX: E11.9   glucose blood test strip 1 each by Other route as needed for other. Use as instructed   hydrochlorothiazide (HYDRODIURIL) 25 MG tablet Take 1 tablet (25 mg total) by mouth daily.   Ivermectin 1 % CREA Apply topically daily.   Magnesium 500 MG TABS Take 500 mg by mouth at bedtime.   metFORMIN (GLUCOPHAGE) 1000 MG tablet Take 1 tablet (1,000 mg total) by mouth 2 (two) times daily with a meal.   Omega-3 Fatty Acids (FISH OIL) 1200 MG CAPS Take 1,200-2,400 mg by mouth 2 (two) times daily. 1 in the am and 2 at night   ondansetron (ZOFRAN) 4 MG tablet TAKE 1 TABLET BY MOUTH EVERY 8 HOURS AS NEEDED FOR NAUSEA AND VOMITING   pantoprazole (PROTONIX) 20 MG tablet Take 1 tablet (20 mg total) by mouth daily.   rosuvastatin (CRESTOR) 20 MG tablet Take 1 tablet (20 mg total) by mouth daily.   Semaglutide, 2 MG/DOSE, 8 MG/3ML SOPN Inject 2 mg as directed once a week.   SYMBICORT 160-4.5 MCG/ACT inhaler TAKE 2 PUFFS BY MOUTH TWICE A DAY   telmisartan (MICARDIS) 80 MG tablet Take 1 tablet (80 mg total) by mouth daily.   No facility-administered encounter medications on file as of 11/17/2021.    Allergies (verified) Codeine sulfate and Lisinopril   History:  Past Medical History:  Diagnosis Date   Arthritis    Bursitis    Cancer ([place])    SKIN   Diabetes mellitus without complication (HCC)    GERD (gastroesophageal reflux disease)    Hyperlipidemia    Hypertension    Sleep apnea    Past Surgical History:  Procedure Laterality Date   cancer removal off nose     CATARACT EXTRACTION W/PHACO Right 01/13/2017   Procedure: CATARACT EXTRACTION PHACO AND INTRAOCULAR LENS PLACEMENT ([place]);  Surgeon: Eulogio Bear, MD;  Location: [facility] ORS;  Service: Ophthalmology;  Laterality: Right;  [place] 31.1 AP% 0.0 CDE 1.78 Fluid Pack lot #  [id number]   CATARACT EXTRACTION W/PHACO Left 02/24/2017   Procedure: CATARACT EXTRACTION PHACO AND INTRAOCULAR LENS PLACEMENT ([facility]);  Surgeon: Eulogio Bear, MD;  Location: [facility] ORS;  Service: Ophthalmology;  Laterality: Left;  [place]  00:45.5 AP  0.7 CDE   2.76  fluid pack lot # [id number]  exp. 08-19-2018   DILATION AND CURETTAGE OF UTERUS     TUBAL LIGATION     Family History  Problem Relation Age of Onset   Alcohol abuse Mother    Heart disease Father    Alcohol abuse Father    Diabetes Sister    Hyperlipidemia Sister    Diabetes Brother    Hypertension Brother    Thyroid disease Daughter    Diabetes Maternal Grandmother    Hypertension Sister    Aneurysm Sister    Diabetes Sister    Diabetes Sister    Breast cancer Maternal Aunt    Social History   Socioeconomic History   Marital status: Married    Spouse name: Not on file   Number of children: 4   Years of education: Not on file   Highest education level: Associate degree: occupational, Hotel manager, or vocational program  Occupational History   Occupation: retired Charity fundraiser  Tobacco Use   Smoking status: Never   Smokeless tobacco: Never  Vaping Use   Vaping Use: Never used  Substance and Sexual Activity   Alcohol use: No    Alcohol/week: 0.0 standard drinks   Drug use: No   Sexual activity: Yes  Other Topics Concern   Not on file  Social History Narrative   Not on file   Social Determinants of Health   Financial Resource Strain: Low Risk    Difficulty of Paying Living Expenses: Not hard at all  Food Insecurity: No Food Insecurity   Worried About Charity fundraiser in the Last Year: Never true   [place] in the Last Year: Never true  Transportation Needs: No Transportation Needs   Lack of Transportation (Medical): No   Lack of Transportation (Non-Medical): No  Physical Activity: Insufficiently Active   Days of Exercise per Week: 2 days   Minutes of Exercise per Session: 20 min  Stress: No Stress  Concern Present   Feeling of Stress : Not at all  Social Connections: Moderately Integrated   Frequency of Communication with Friends and Family: More than three times a week   Frequency of Social Gatherings with Friends and Family: Three times a week   Attends Religious Services: 1 to 4 times per year   Active Member of Clubs or Organizations: No   Attends Archivist Meetings: Never   Marital Status: Married    Tobacco Counseling Counseling given: Not Answered   Clinical Intake:  Pre-visit preparation completed: Yes  Pain : No/denies pain Pain Score:  0-No pain   Nutritional Risks: None  How often do you need to have someone help you when you read instructions, pamphlets, or other written materials from your doctor or pharmacy?: 1 - Never  Diabetic?  Yes  Nutrition Risk Assessment:  Has the patient had any N/V/D within the last 2 months?  No  Does the patient have any non-healing wounds?  No  Has the patient had any unintentional weight loss or weight gain?  No   Diabetes:  Is the patient diabetic?  Yes  If diabetic, was a CBG obtained today?  No  Did the patient bring in their glucometer from home?  No  How often do you monitor your CBG's? 2 x a week.   Financial Strains and Diabetes Management:  Are you having any financial strains with the device, your supplies or your medication? No .  Does the patient want to be seen by Chronic Care Management for management of their diabetes?  No  Would the patient like to be referred to a Nutritionist or for Diabetic Management?  No   Diabetic Exams:  Diabetic Eye Exam:  Overdue for diabetic eye exam. Pt has been advised about the importance in completing this exam office referred to regarding appt.  Diabetic Foot Exam:. Pt has been advised about the importance in completing this exam.     Activities of Daily Living In your present state of health, do you have any difficulty performing the following  activities: 11/17/2021  Hearing? N  Vision? N  Difficulty concentrating or making decisions? N  Walking or climbing stairs? N  Dressing or bathing? N  Doing errands, shopping? N  Preparing Food and eating ? N  Using the Toilet? N  In the past six months, have you accidently leaked urine? N  Do you have problems with loss of bowel control? N  Managing your Medications? N  Managing your Finances? N  Housekeeping or managing your Housekeeping? N  Some recent data might be hidden    Patient Care Team: Venita Lick, NP as PCP - General (Nurse Practitioner) Oneta Rack, MD (Dermatology)  Indicate any recent Medical Services you may have received from other than [facility] providers in the past year (date may be approximate).     Assessment:   This is a routine wellness examination for [place].  Hearing/Vision screen Hearing Screening - Comments:: No trouble hearing Vision Screening - Comments:: Not up to date  Dietary issues and exercise activities discussed: Current Exercise Habits: Home exercise routine, Type of exercise: walking, Time (Minutes): 20, Frequency (Times/Week): 2, Weekly Exercise (Minutes/Week): 40   Goals Addressed             This Visit's Progress    DIET - INCREASE WATER INTAKE   On track    Recommend drinking at least 6-8 glasses of water a day      Patient Stated       Continue current lifestyle         Depression Screen PHQ 2/9 Scores 11/17/2021 10/23/2021 08/25/2021 11/14/2020 10/31/2019 10/31/2019 10/19/2018  PHQ - 2 Score 0 0 0 0 0 0 0  PHQ- 9 Score 0 1 2 - - - -    Fall Risk Fall Risk  11/17/2021 10/23/2021 11/14/2020 10/31/2019 10/31/2019  Falls in the past year? 0 0 0 0 0  Number falls in past yr: 0 0 - 0 0  Injury with Fall? 0 0 - 0 0  Risk for fall due to : -  No Fall Risks Medication side effect - -  Follow up Education provided;Falls prevention discussed;Falls evaluation completed Falls evaluation completed Falls evaluation  completed;Education provided;Falls prevention discussed - -    FALL RISK PREVENTION PERTAINING TO THE HOME:  Any stairs in or around the home? No  If so, are there any without handrails? No  Home free of loose throw rugs in walkways, pet beds, electrical cords, etc? Yes  Adequate lighting in your home to reduce risk of falls? Yes   ASSISTIVE DEVICES UTILIZED TO PREVENT FALLS:  Life alert? No  Use of a cane, walker or w/c? No  Grab bars in the bathroom? No  Shower chair or bench in shower? No  Elevated toilet seat or a handicapped toilet? No   TIMED UP AND GO:  Was the test performed? No .    Cognitive Function:  Normal cognitive status assessed by direct observation by this Nurse Health Advisor. No abnormalities found.       6CIT Screen 11/14/2020 10/31/2019  What Year? 0 points 0 points  What month? 0 points 0 points  What time? 0 points 0 points  Count back from 20 0 points 0 points  Months in reverse 0 points 0 points  Repeat phrase 0 points 0 points  Total Score 0 0    Immunizations Immunization History  Administered Date(s) Administered   Fluad Quad(high Dose 65+) 05/29/2019, 08/08/2020, 08/25/2021   Influenza, High Dose Seasonal PF 07/15/2017, 07/27/2018   Influenza-Unspecified 06/21/2016   Pneumococcal Conjugate-13 07/15/2017   Pneumococcal Polysaccharide-23 05/22/2014, 05/29/2019   Td 01/29/2020   Tdap 03/04/2008   Zoster Recombinat (Shingrix) 01/31/2018, 05/02/2018, 05/04/2019   Zoster, Live 02/26/2012    TDAP status: Up to date  Flu Vaccine status: Up to date  Pneumococcal vaccine status: Up to date  Covid-19 vaccine status: Information provided on how to obtain vaccines.   Qualifies for Shingles Vaccine? No   Zostavax completed Yes   Shingrix Completed?: Yes  Screening Tests Health Maintenance  Topic Date Due   FOOT EXAM  11/17/2021   OPHTHALMOLOGY EXAM  11/17/2021 (Originally 05/03/2020)   HEMOGLOBIN A1C  02/23/2022   COLONOSCOPY (Pts  45-3yrs Insurance coverage will need to be confirmed)  09/27/2022   MAMMOGRAM  08/04/2023   DEXA SCAN  05/22/2024   TETANUS/TDAP  01/28/2030   Pneumonia Vaccine 102+ Years old  Completed   INFLUENZA VACCINE  Completed   Hepatitis C Screening  Completed   Zoster Vaccines- Shingrix  Completed   HPV VACCINES  Aged Out   COVID-19 Vaccine  Discontinued    Health Maintenance  Health Maintenance Due  Topic Date Due   FOOT EXAM  11/17/2021    Colorectal cancer screening: Type of screening: Colonoscopy. Completed 2014. Repeat every 10 years  Mammogram status: Completed  . Repeat every year  Bone Density status: Completed 2020. Results reflect: Bone density results: OSTEOPENIA. Repeat every 5 years.  Lung Cancer Screening: (Low Dose CT Chest recommended if Age 69-80 years, 30 pack-year currently smoking OR have quit w/in 15years.) does not qualify.   Lung Cancer Screening Referral:   Additional Screening:  Hepatitis C Screening: does not qualify; Completed 2019  Vision Screening: Recommended annual ophthalmology exams for early detection of glaucoma and other disorders of the eye. Is the patient up to date with their annual eye exam?  No  Who is the provider or what is the name of the office in which the patient attends annual eye exams?  If pt is not  established with a provider, would they like to be referred to a provider to establish care? No .   Dental Screening: Recommended annual dental exams for proper oral hygiene  Community Resource Referral / Chronic Care Management: CRR required this visit?  No   CCM required this visit?  No      Plan:     I have personally reviewed and noted the following in the patients chart:   Medical and social history Use of alcohol, tobacco or illicit drugs  Current medications and supplements including opioid prescriptions.  Functional ability and status Nutritional status Physical activity Advanced directives List of other  physicians Hospitalizations, surgeries, and ER visits in previous 12 months Vitals Screenings to include cognitive, depression, and falls Referrals and appointments  In addition, I have reviewed and discussed with patient certain preventive protocols, quality metrics, and best practice recommendations. A written personalized care plan for preventive services as well as general preventive health recommendations were provided to patient.     Leroy Kennedy, LPN   [id number]   Nurse Notes:

## 2021-11-17 NOTE — Patient Instructions (Signed)
[name] , Thank you for taking time to come for your Medicare Wellness Visit. I appreciate your ongoing commitment to your health goals. Please review the following plan we discussed and let me know if I can assist you in the future.   Screening recommendations/referrals: Colonoscopy: up to date Mammogram: up to date Bone Density: up to date Recommended yearly ophthalmology/optometry visit for glaucoma screening and checkup Recommended yearly dental visit for hygiene and checkup  Vaccinations: Influenza vaccine: up to date Pneumococcal vaccine: up to date Tdap vaccine: up to date Shingles vaccine: up to date    Advanced directives: Education provided  Conditions/risks identified:   Next appointment: 11-23-2021 @ 8:20  Charlene Juarez   Preventive Care 65 Years and Older, Female Preventive care refers to lifestyle choices and visits with your health care provider that can promote health and wellness. What does preventive care include? A yearly physical exam. This is also called an annual well check. Dental exams once or twice a year. Routine eye exams. Ask your health care provider how often you should have your eyes checked. Personal lifestyle choices, including: Daily care of your teeth and gums. Regular physical activity. Eating a healthy diet. Avoiding tobacco and drug use. Limiting alcohol use. Practicing safe sex. Taking low-dose aspirin every day. Taking vitamin and mineral supplements as recommended by your health care provider. What happens during an annual well check? The services and screenings done by your health care provider during your annual well check will depend on your age, overall health, lifestyle risk factors, and family history of disease. Counseling  Your health care provider may ask you questions about your: Alcohol use. Tobacco use. Drug use. Emotional well-being. Home and relationship well-being. Sexual activity. Eating habits. History of  falls. Memory and ability to understand (cognition). Work and work Statistician. Reproductive health. Screening  You may have the following tests or measurements: Height, weight, and BMI. Blood pressure. Lipid and cholesterol levels. These may be checked every 5 years, or more frequently if you are over 17 years old. Skin check. Lung cancer screening. You may have this screening every year starting at age 31 if you have a 30-pack-year history of smoking and currently smoke or have quit within the past 15 years. Fecal occult blood test (FOBT) of the stool. You may have this test every year starting at age 19. Flexible sigmoidoscopy or colonoscopy. You may have a sigmoidoscopy every 5 years or a colonoscopy every 10 years starting at age 4. Hepatitis C blood test. Hepatitis B blood test. Sexually transmitted disease (STD) testing. Diabetes screening. This is done by checking your blood sugar (glucose) after you have not eaten for a while (fasting). You may have this done every 1-3 years. Bone density scan. This is done to screen for osteoporosis. You may have this done starting at age 58. Mammogram. This may be done every 1-2 years. Talk to your health care provider about how often you should have regular mammograms. Talk with your health care provider about your test results, treatment options, and if necessary, the need for more tests. Vaccines  Your health care provider may recommend certain vaccines, such as: Influenza vaccine. This is recommended every year. Tetanus, diphtheria, and acellular pertussis (Tdap, Td) vaccine. You may need a Td booster every 10 years. Zoster vaccine. You may need this after age 75. Pneumococcal 13-valent conjugate (PCV13) vaccine. One dose is recommended after age 37. Pneumococcal polysaccharide (PPSV23) vaccine. One dose is recommended after age 21. Talk to your health  care provider about which screenings and vaccines you need and how often you need  them. This information is not intended to replace advice given to you by your health care provider. Make sure you discuss any questions you have with your health care provider. Document Released: 10/03/2015 Document Revised: 05/26/2016 Document Reviewed: 07/08/2015 Elsevier Interactive Patient Education  2017 [place] Prevention in the Home Falls can cause injuries. They can happen to people of all ages. There are many things you can do to make your home safe and to help prevent falls. What can I do on the outside of my home? Regularly fix the edges of walkways and driveways and fix any cracks. Remove anything that might make you trip as you walk through a door, such as a raised step or threshold. Trim any bushes or trees on the path to your home. Use bright outdoor lighting. Clear any walking paths of anything that might make someone trip, such as rocks or tools. Regularly check to see if handrails are loose or broken. Make sure that both sides of any steps have handrails. Any raised decks and porches should have guardrails on the edges. Have any leaves, snow, or ice cleared regularly. Use sand or salt on walking paths during winter. Clean up any spills in your garage right away. This includes oil or grease spills. What can I do in the bathroom? Use night lights. Install grab bars by the toilet and in the tub and shower. Do not use towel bars as grab bars. Use non-skid mats or decals in the tub or shower. If you need to sit down in the shower, use a plastic, non-slip stool. Keep the floor dry. Clean up any water that spills on the floor as soon as it happens. Remove soap buildup in the tub or shower regularly. Attach bath mats securely with double-sided non-slip rug tape. Do not have throw rugs and other things on the floor that can make you trip. What can I do in the bedroom? Use night lights. Make sure that you have a light by your bed that is easy to reach. Do not use  any sheets or blankets that are too big for your bed. They should not hang down onto the floor. Have a firm chair that has side arms. You can use this for support while you get dressed. Do not have throw rugs and other things on the floor that can make you trip. What can I do in the kitchen? Clean up any spills right away. Avoid walking on wet floors. Keep items that you use a lot in easy-to-reach places. If you need to reach something above you, use a strong step stool that has a grab bar. Keep electrical cords out of the way. Do not use floor polish or wax that makes floors slippery. If you must use wax, use non-skid floor wax. Do not have throw rugs and other things on the floor that can make you trip. What can I do with my stairs? Do not leave any items on the stairs. Make sure that there are handrails on both sides of the stairs and use them. Fix handrails that are broken or loose. Make sure that handrails are as long as the stairways. Check any carpeting to make sure that it is firmly attached to the stairs. Fix any carpet that is loose or worn. Avoid having throw rugs at the top or bottom of the stairs. If you do have throw rugs, attach them to  the floor with carpet tape. Make sure that you have a light switch at the top of the stairs and the bottom of the stairs. If you do not have them, ask someone to add them for you. What else can I do to help prevent falls? Wear shoes that: Do not have high heels. Have rubber bottoms. Are comfortable and fit you well. Are closed at the toe. Do not wear sandals. If you use a stepladder: Make sure that it is fully opened. Do not climb a closed stepladder. Make sure that both sides of the stepladder are locked into place. Ask someone to hold it for you, if possible. Clearly mark and make sure that you can see: Any grab bars or handrails. First and last steps. Where the edge of each step is. Use tools that help you move around (mobility aids)  if they are needed. These include: Canes. Walkers. Scooters. Crutches. Turn on the lights when you go into a dark area. Replace any light bulbs as soon as they burn out. Set up your furniture so you have a clear path. Avoid moving your furniture around. If any of your floors are uneven, fix them. If there are any pets around you, be aware of where they are. Review your medicines with your doctor. Some medicines can make you feel dizzy. This can increase your chance of falling. Ask your doctor what other things that you can do to help prevent falls. This information is not intended to replace advice given to you by your health care provider. Make sure you discuss any questions you have with your health care provider. Document Released: 07/03/2009 Document Revised: 02/12/2016 Document Reviewed: 10/11/2014 Elsevier Interactive Patient Education  2017 [employer].

## 2021-11-22 NOTE — Patient Instructions (Signed)

## 2021-11-23 ENCOUNTER — Ambulatory Visit (INDEPENDENT_AMBULATORY_CARE_PROVIDER_SITE_OTHER): Payer: Medicare Other | Admitting: Nurse Practitioner

## 2021-11-23 ENCOUNTER — Other Ambulatory Visit: Payer: Self-pay

## 2021-11-23 ENCOUNTER — Telehealth: Payer: Self-pay

## 2021-11-23 ENCOUNTER — Encounter: Payer: Self-pay | Admitting: Nurse Practitioner

## 2021-11-23 VITALS — BP 118/65 | HR 75 | Temp 97.9°F | Ht 62.0 in | Wt 164.2 lb

## 2021-11-23 DIAGNOSIS — E6609 Other obesity due to excess calories: Secondary | ICD-10-CM | POA: Diagnosis not present

## 2021-11-23 DIAGNOSIS — M85851 Other specified disorders of bone density and structure, right thigh: Secondary | ICD-10-CM

## 2021-11-23 DIAGNOSIS — E559 Vitamin D deficiency, unspecified: Secondary | ICD-10-CM

## 2021-11-23 DIAGNOSIS — E1159 Type 2 diabetes mellitus with other circulatory complications: Secondary | ICD-10-CM | POA: Diagnosis not present

## 2021-11-23 DIAGNOSIS — J4520 Mild intermittent asthma, uncomplicated: Secondary | ICD-10-CM

## 2021-11-23 DIAGNOSIS — E669 Obesity, unspecified: Secondary | ICD-10-CM

## 2021-11-23 DIAGNOSIS — Z6830 Body mass index (BMI) 30.0-30.9, adult: Secondary | ICD-10-CM

## 2021-11-23 DIAGNOSIS — E1169 Type 2 diabetes mellitus with other specified complication: Secondary | ICD-10-CM | POA: Diagnosis not present

## 2021-11-23 DIAGNOSIS — E785 Hyperlipidemia, unspecified: Secondary | ICD-10-CM

## 2021-11-23 DIAGNOSIS — K219 Gastro-esophageal reflux disease without esophagitis: Secondary | ICD-10-CM

## 2021-11-23 DIAGNOSIS — I152 Hypertension secondary to endocrine disorders: Secondary | ICD-10-CM | POA: Diagnosis not present

## 2021-11-23 LAB — MICROALBUMIN, URINE WAIVED
Creatinine, Urine Waived: 50 mg/dL (ref 10–300)
Microalb, Ur Waived: 10 mg/L (ref 0–19)
Microalb/Creat Ratio: <30 mg/g (ref <30)

## 2021-11-23 LAB — BAYER DCA HB A1C WAIVED: HB A1C (BAYER DCA - WAIVED): 8.2 % — ABNORMAL HIGH (ref 4.8–5.6)

## 2021-11-23 MED ORDER — TIRZEPATIDE 2.5 MG/0.5ML ~~LOC~~ SOAJ: 2.5000 mg | SUBCUTANEOUS | 0 refills | Status: DC

## 2021-11-23 NOTE — Telephone Encounter (Signed)
Prior authorization initiated for [facility] 2.'5MG'$  via CoverMyMeds. ? ?KEY: [id number] ?

## 2021-11-23 NOTE — Assessment & Plan Note (Signed)
Continue current supplement and plan for DEXA scan repeat in 2025 due to osteopenia..  Check Vit D today. 

## 2021-11-23 NOTE — Assessment & Plan Note (Addendum)
Chronic, ongoing.  Will continue Rosuvastatin daily and adjust dose as needed.  Return to office in 3 months.  Recent LDL 36.  Lipid panel today. ?

## 2021-11-23 NOTE — Assessment & Plan Note (Signed)
Chronic, improved with Protonix.  Continue this regimen and adjust as needed.  Check Mag level annually. ?

## 2021-11-23 NOTE — Assessment & Plan Note (Signed)
Chronic, stable.  Minimal inhaler use.  Continue to monitor. ?

## 2021-11-23 NOTE — Progress Notes (Addendum)
BP 118/65    Pulse 75    Temp 97.9 F (36.6 C) (Oral)    Ht _0  (1.575 m)    Wt 164 lb 3.2 oz (74.5 kg)    LMP 07/13/2000 (Approximate)    SpO2 97%    BMI 30.03 kg/m    Subjective:    Patient ID: Charlene Juarez, female    DOB: [birth year], 71 y.o.   MRN: [id number]  HPI: Charlene Juarez is a 71 y.o. female  Chief Complaint  Patient presents with   Diabetes   Hyperlipidemia   Hypertension   Asthma   Osteopenia   DIABETES Last A1c was 7.5% due to coming off Keto and eating more of her baked goods.  Currently continues Ozempic to 2 MG, Jardiance 25 MG, Metformin 1000 MG BID.  Hypoglycemic episodes:no Polydipsia/polyuria: no Visual disturbance: no Chest pain: no Paresthesias: no Glucose Monitoring: no  Accucheck frequency: not checking  Fasting glucose:  Post prandial:  Evening:   Before meals: Taking Insulin?: no  Long acting insulin:  Short acting insulin: Blood Pressure Monitoring: yes Retinal Examination: Not Up to Date -- not yet due to cost Foot Exam: Up to Date Pneumovax: Up to Date Influenza: Up to Date Aspirin: no   HYPERTENSION / HYPERLIPIDEMIA Continues on HCTZ, Crestor, Telmisartan + Fish Oil. Satisfied with current treatment? yes Duration of hypertension: chronic BP monitoring frequency: yes BP range:  BP medication side effects: no Duration of hyperlipidemia: chronic Cholesterol medication side effects: no Cholesterol supplements: fish oil Medication compliance: good compliance Aspirin: no Recent stressors: no Recurrent headaches: no Visual changes: no Palpitations: no Dyspnea: no Chest pain: no Lower extremity edema: no Dizzy/lightheaded: no  ASTHMA Continues on Symbicort as needed only, only takes it when sick or allergies acting up.  Also has Ventolin as needed. Asthma status: controlled Satisfied with current treatment?: yes Albuterol/rescue inhaler frequency: varies, with seasonal changes 1-2 times a week Dyspnea  frequency: none Wheezing frequency: none Cough frequency: none Nocturnal symptom frequency: none Limitation of activity: no Current upper respiratory symptoms: no Asthma meds in past: none Aerochamber/spacer use: no Visits to ER or Urgent Care in past year: no Pneumovax: Up to Date Influenza: Up to Date   GERD Continues on Protonix 20 MG -- improved with this on board. GERD control status: stable Satisfied with current treatment? yes Heartburn frequency:  Medication side effects: no  Medication compliance: stable Dysphagia: no Odynophagia:  no Hematemesis: no Blood in stool: no EGD: yes   OSTEOPENIA Last DEXA in 2020 = the BMD measured at Femur Neck Right is 0.724 g/cm2 with a T-score of -2.3.  No recent falls or fractures. Satisfied with current treatment?: yes Adequate calcium & vitamin D: yes Weight bearing exercises: yes   Relevant past medical, surgical, family and social history reviewed and updated as indicated. Interim medical history since our last visit reviewed. Allergies and medications reviewed and updated.  Review of Systems  Constitutional: Negative.   Eyes: Negative.   Respiratory: Negative.    Cardiovascular: Negative.   Gastrointestinal: Negative.   Endocrine: Negative.   Neurological: Negative.    Per HPI unless specifically indicated above     Objective:    BP 118/65    Pulse 75    Temp 97.9 F (36.6 C) (Oral)    Ht _1  (1.575 m)    Wt 164 lb 3.2 oz (74.5 kg)    LMP 07/13/2000 (Approximate)    SpO2 97%    BMI  30.03 kg/m   Wt Readings from Last 3 Encounters:  11/23/21 164 lb 3.2 oz (74.5 kg)  10/23/21 164 lb 12.8 oz (74.8 kg)  08/25/21 164 lb 9.6 oz (74.7 kg)    Physical Exam Vitals and nursing note reviewed.  Constitutional:      General: She is awake. She is not in acute distress.    Appearance: She is well-developed and well-groomed. She is obese. She is not ill-appearing or toxic-appearing.  HENT:     Head: Normocephalic.  Eyes:      General: Lids are normal. No scleral icterus.    Pupils: Pupils are equal, round, and reactive to light.  Neck:     Vascular: No carotid bruit.  Cardiovascular:     Rate and Rhythm: Normal rate and regular rhythm.     Pulses: Normal pulses.     Heart sounds: Normal heart sounds. No murmur heard.   No gallop.  Pulmonary:     Effort: Pulmonary effort is normal. No accessory muscle usage or respiratory distress.     Breath sounds: Normal breath sounds.  Abdominal:     General: Bowel sounds are normal. There is no distension.     Palpations: Abdomen is soft.  Musculoskeletal:     Cervical back: Full passive range of motion without pain.     Right lower leg: No edema.     Left lower leg: No edema.  Skin:    General: Skin is warm and dry.  Neurological:     Mental Status: She is alert.  Psychiatric:        Attention and Perception: Attention normal.        Mood and Affect: Mood normal.        Speech: Speech normal.        Behavior: Behavior is cooperative.        Thought Content: Thought content normal.   Diabetic Foot Exam - Simple   Simple Foot Form Visual Inspection No deformities, no ulcerations, no other skin breakdown bilaterally: Yes Sensation Testing Intact to touch and monofilament testing bilaterally: Yes Pulse Check Posterior Tibialis and Dorsalis pulse intact bilaterally: Yes Comments      Results for orders placed or performed in visit on 10/23/21  Comprehensive metabolic panel  Result Value Ref Range   Glucose 140 (H) 70 - 99 mg/dL   BUN 18 8 - 27 mg/dL   Creatinine, Ser 0.68 0.57 - 1.00 mg/dL   eGFR 94 >59 mL/min/1.73   BUN/Creatinine Ratio 26 12 - 28   Sodium 136 134 - 144 mmol/L   Potassium 4.1 3.5 - 5.2 mmol/L   Chloride 95 (L) 96 - 106 mmol/L   CO2 25 20 - 29 mmol/L   Calcium 9.4 8.7 - 10.3 mg/dL   Total Protein 7.1 6.0 - 8.5 g/dL   Albumin 4.8 3.8 - 4.8 g/dL   Globulin, Total 2.3 1.5 - 4.5 g/dL   Albumin/Globulin Ratio 2.1 1.2 - 2.2    Bilirubin Total 0.4 0.0 - 1.2 mg/dL   Alkaline Phosphatase 60 44 - 121 IU/L   AST 35 0 - 40 IU/L   ALT 71 (H) 0 - 32 IU/L  TSH  Result Value Ref Range   TSH 2.320 0.450 - 4.500 uIU/mL  Magnesium  Result Value Ref Range   Magnesium 2.3 1.6 - 2.3 mg/dL  CBC with Differential/Platelet  Result Value Ref Range   WBC 7.6 3.4 - 10.8 x10E3/uL   RBC 5.25 3.77 - 5.28 x10E6/uL  Hemoglobin 15.1 11.1 - 15.9 g/dL   Hematocrit 46.4 34.0 - 46.6 %   MCV 88 79 - 97 fL   MCH 28.8 26.6 - 33.0 pg   MCHC 32.5 31.5 - 35.7 g/dL   RDW 12.1 11.7 - 15.4 %   Platelets 241 150 - 450 x10E3/uL   Neutrophils 57 Not Estab. %   Lymphs 32 Not Estab. %   Monocytes 8 Not Estab. %   Eos 1 Not Estab. %   Basos 1 Not Estab. %   Neutrophils Absolute 4.4 1.4 - 7.0 x10E3/uL   Lymphocytes Absolute 2.4 0.7 - 3.1 x10E3/uL   Monocytes Absolute 0.6 0.1 - 0.9 x10E3/uL   EOS (ABSOLUTE) 0.1 0.0 - 0.4 x10E3/uL   Basophils Absolute 0.0 0.0 - 0.2 x10E3/uL   Immature Granulocytes 1 Not Estab. %   Immature Grans (Abs) 0.0 0.0 - 0.1 x10E3/uL      Assessment & Plan:   Problem List Items Addressed This Visit       Cardiovascular and Mediastinum   Hypertension associated with diabetes ([place])    Chronic, stable with BP at goal in office and at home.  Continue current medication regimen, Micardis for kidney protection, and adjust as needed.  CMP and urine ALB today.  Recommend she continue to check BP at home on occasion and document for visits. Focus on DASH diet.  Return in 3 months.      Relevant Medications   tirzepatide (MOUNJARO) 2.5 MG/0.5ML Pen   Other Relevant Orders   Bayer DCA Hb A1c Waived   Microalbumin, Urine Waived   Comprehensive metabolic panel     Respiratory   Asthma, intermittent    Chronic, stable.  Minimal inhaler use.  Continue to monitor.        Digestive   GERD (gastroesophageal reflux disease)    Chronic, improved with Protonix.  Continue this regimen and adjust as needed.  Check Mag level  annually.        Endocrine   Hyperlipidemia associated with type 2 diabetes mellitus (HCC)    Chronic, ongoing.  Will continue Rosuvastatin daily and adjust dose as needed.  Return to office in 3 months.  Recent LDL 36.  Lipid panel today.      Relevant Medications   tirzepatide (MOUNJARO) 2.5 MG/0.5ML Pen   Other Relevant Orders   Bayer DCA Hb A1c Waived   Comprehensive metabolic panel   Lipid Panel w/o Chol/HDL Ratio   Type 2 diabetes mellitus with obesity (HCC) - Primary    Chronic, ongoing.  "Urine ALB 10 today.  A1c today 8.2% which is increased from previous 7.5%, will work at this time on getting [facility] coverage, discussed with patient.  She is on max doses Ozempic, Jardiance, and Metformin -- goal is to avoid sulfonylurea or insulin due to age.  She agrees with this plan, goal to avoid insulin need.  Monitor blood sugar twice a day.  Obtain eye exam.  Return in 5 weeks.      Relevant Medications   tirzepatide (MOUNJARO) 2.5 MG/0.5ML Pen   Other Relevant Orders   Bayer DCA Hb A1c Waived   Microalbumin, Urine Waived     Musculoskeletal and Integument   Osteopenia of neck of right femur    Noted on Dexa in 2020.  Repeat Dexa 2025.  Continue daily Vit D and calcium. Check levels today.      Relevant Orders   VITAMIN D 25 Hydroxy (Vit-D Deficiency, Fractures)     Other  Obesity    BMI 30.03.  Recommended eating smaller high protein, low fat meals more frequently and exercising 30 mins a day 5 times a week with a goal of 10-15lb weight loss in the next 3 months. Patient voiced their understanding and motivation to adhere to these recommendations.       Relevant Medications   tirzepatide [facility]) 2.5 MG/0.5ML Pen   Vitamin D deficiency    Continue current supplement and plan for DEXA scan repeat in 2025 due to osteopenia..  Check Vit D today.        Follow up plan: Return in about 5 weeks (around 12/28/2021) for T2DM.

## 2021-11-23 NOTE — Telephone Encounter (Signed)
Copied from [place] [id number]. Topic: General - Other ?>> Nov 23, 2021  3:18 PM Tessa Lerner A wrote: ?Reason for CRM: The patient has been notified that a prescription for tirzepatide [facility]) 2.5 MG/0.5ML Pen [[id number]]  was submitted for them  ? ?The patient would like to know if they're supposed to continue taking Ozempic as well  ? ?Please contact further ?

## 2021-11-23 NOTE — Assessment & Plan Note (Signed)
Chronic, stable with BP at goal in office and at home.  Continue current medication regimen, Micardis for kidney protection, and adjust as needed.  CMP and urine ALB today.  Recommend she continue to check BP at home on occasion and document for visits. Focus on DASH diet.  Return in 3 months. ?

## 2021-11-23 NOTE — Assessment & Plan Note (Signed)
BMI 30.03.  Recommended eating smaller high protein, low fat meals more frequently and exercising 30 mins a day 5 times a week with a goal of 10-15lb weight loss in the next 3 months. Patient voiced their understanding and motivation to adhere to these recommendations. ? ?

## 2021-11-23 NOTE — Assessment & Plan Note (Signed)
Noted on Dexa in 2020.  Repeat Dexa 2025.  Continue daily Vit D and calcium. Check levels today. ?

## 2021-11-23 NOTE — Assessment & Plan Note (Addendum)
Chronic, ongoing.  "Urine ALB 10 today.  A1c today 8.2% which is increased from previous 7.5%, will work at this time on getting [facility] coverage, discussed with patient.  She is on max doses Ozempic, Jardiance, and Metformin -- goal is to avoid sulfonylurea or insulin due to age.  She agrees with this plan, goal to avoid insulin need.  Monitor blood sugar twice a day.  Obtain eye exam.  Return in 5 weeks. ?

## 2021-11-24 LAB — LIPID PANEL W/O CHOL/HDL RATIO
Cholesterol, Total: 135 mg/dL (ref 100–199)
HDL: 53 mg/dL (ref >39)
LDL Chol Calc (NIH): 59 mg/dL (ref 0–99)
Triglycerides: 131 mg/dL (ref 0–149)
VLDL Cholesterol Cal: 23 mg/dL (ref 5–40)

## 2021-11-24 LAB — COMPREHENSIVE METABOLIC PANEL
ALT: 67 IU/L — ABNORMAL HIGH (ref 0–32)
AST: 40 IU/L (ref 0–40)
Albumin/Globulin Ratio: 2 (ref 1.2–2.2)
Albumin: 4.6 g/dL (ref 3.8–4.8)
Alkaline Phosphatase: 62 IU/L (ref 44–121)
BUN/Creatinine Ratio: 22 (ref 12–28)
BUN: 16 mg/dL (ref 8–27)
Bilirubin Total: 0.3 mg/dL (ref 0.0–1.2)
CO2: 26 mmol/L (ref 20–29)
Calcium: 9.5 mg/dL (ref 8.7–10.3)
Chloride: 97 mmol/L (ref 96–106)
Creatinine, Ser: 0.74 mg/dL (ref 0.57–1.00)
Globulin, Total: 2.3 g/dL (ref 1.5–4.5)
Glucose: 156 mg/dL — ABNORMAL HIGH (ref 70–99)
Potassium: 4 mmol/L (ref 3.5–5.2)
Sodium: 137 mmol/L (ref 134–144)
Total Protein: 6.9 g/dL (ref 6.0–8.5)
eGFR: 87 mL/min/{1.73_m2} (ref >59)

## 2021-11-24 LAB — VITAMIN D 25 HYDROXY (VIT D DEFICIENCY, FRACTURES): Vit D, 25-Hydroxy: 43.2 ng/mL (ref 30.0–100.0)

## 2021-11-24 NOTE — Progress Notes (Signed)
Contacted via [place] ? ? ?Good evening Anye, your labs have returned: ?- Kidney function, creatinine and eGFR, remains normal.  Liver function shows normal AST, but mildly elevated ALT, however this has trended down from previous check.  We will continue to monitor. ?- Cholesterol labs remain stable, continue statin. ?- Vitamin D level stable, continue supplement. Any questions? ?Keep being amazing!!  Thank you for allowing me to participate in your care.  I appreciate you. ?Kindest regards, ?Reatha Sur ? ?

## 2021-11-24 NOTE — Telephone Encounter (Signed)
Spoke with patient and notified her of Jolene's recommendations. Patient verbalized understanding and has no further questions at this time. FYI ?

## 2021-11-26 ENCOUNTER — Other Ambulatory Visit: Payer: Self-pay | Admitting: Nurse Practitioner

## 2021-11-26 MED ORDER — EMPAGLIFLOZIN 25 MG PO TABS: 25.0000 mg | ORAL_TABLET | Freq: Every day | ORAL | 1 refills | Status: DC

## 2021-11-26 NOTE — Telephone Encounter (Signed)
Requested medication (s) are due for refill today: yes ? ?Requested medication (s) are on the active medication list: no ? ?Last refill:  09/11/21 ? ?Future visit scheduled: yes ? ?Notes to clinic:  rx was dc'd on 08/25/21. Please advise ? ? ?  ?Requested Prescriptions  ?Pending Prescriptions Disp Refills  ? traZODone (DESYREL) 50 MG tablet [Pharmacy Med Name: TRAZODONE 50 MG TABLET] 90 tablet 0  ?  Sig: TAKE 1/2 TO 1 TABLET BY MOUTH AT BEDTIME AS NEEDED FOR SLEEP  ?  ? Psychiatry: Antidepressants - Serotonin Modulator Passed - 11/26/2021  9:03 AM  ?  ?  Passed - Valid encounter within last 6 months  ?  Recent Outpatient Visits   ? ?      ? 3 days ago Type 2 diabetes mellitus with obesity ([place])  ? [place], Jenison T, NP  ? 1 month ago Gastroesophageal reflux disease without esophagitis  ? [place], [place] T, NP  ? 3 months ago Type 2 diabetes mellitus with obesity ([place])  ? [place], [place] T, NP  ? 6 months ago Type 2 diabetes mellitus with obesity ([place])  ? [place], Jerome T, NP  ? 9 months ago Type 2 diabetes mellitus with obesity ([place])  ? [facility] [place], Henrine Screws T, NP  ? ?  ?  ?Future Appointments   ? ?        ? In 1 month Cannady, Barbaraann Faster, NP [facility]  ? ?  ? ?  ?  ?  ?Signed Prescriptions Disp Refills  ? albuterol (VENTOLIN HFA) 108 (90 Base) MCG/ACT inhaler 18 each 2  ?  Sig: TAKE 2 PUFFS BY MOUTH EVERY 6 HOURS AS NEEDED FOR WHEEZE OR SHORTNESS OF BREATH  ?  ? Pulmonology:  Beta Agonists 2 Passed - 11/26/2021  9:03 AM  ?  ?  Passed - Last BP in normal range  ?  BP Readings from Last 1 Encounters:  ?11/23/21 118/65  ?  ?  ?  ?  Passed - Last Heart Rate in normal range  ?  Pulse Readings from Last 1 Encounters:  ?11/23/21 75  ?  ?  ?  ?  Passed - Valid encounter within last 12 months  ?  Recent Outpatient Visits   ? ?      ? 3 days ago Type 2 diabetes mellitus with obesity ([place])  ?  [place], [place] T, NP  ? 1 month ago Gastroesophageal reflux disease without esophagitis  ? [place], [place] T, NP  ? 3 months ago Type 2 diabetes mellitus with obesity ([place])  ? [place], [place] T, NP  ? 6 months ago Type 2 diabetes mellitus with obesity ([place])  ? [place], Wadley T, NP  ? 9 months ago Type 2 diabetes mellitus with obesity ([place])  ? [facility] [place], Henrine Screws T, NP  ? ?  ?  ?Future Appointments   ? ?        ? In 1 month Cannady, Barbaraann Faster, NP [facility]  ? ?  ? ?  ?  ?  ? ?

## 2021-11-26 NOTE — Telephone Encounter (Signed)
[facility] called and spoke to [place], [facility] about the refill(s) Jardiance requested. Advised it was sent on 02/12/21 #90/4 refill(s). She says they did have it, but for some reason the patient deactivated it, so will need a new Rx sent in. ? ? ?Requested Prescriptions  ?Pending Prescriptions Disp Refills  ? empagliflozin (JARDIANCE) 25 MG TABS tablet 90 tablet 4  ?  Sig: Take 1 tablet (25 mg total) by mouth daily.  ?  ? Endocrinology:  Diabetes - SGLT2 Inhibitors Failed - 11/26/2021 11:19 AM  ?  ?  Failed - HBA1C is between 0 and 7.9 and within 180 days  ?  HB A1C (BAYER DCA - WAIVED)  ?Date Value Ref Range Status  ?11/23/2021 8.2 (H) 4.8 - 5.6 % Final  ?  Comment:  ?           Prediabetes: 5.7 - 6.4 ?         Diabetes: >6.4 ?         Glycemic control for adults with diabetes: <7.0 ?  ?  ?  ?  ?  Passed - Cr in normal range and within 360 days  ?  Creatinine, Ser  ?Date Value Ref Range Status  ?11/23/2021 0.74 0.57 - 1.00 mg/dL Final  ?  ?  ?  ?  Passed - eGFR in normal range and within 360 days  ?  GFR calc Af Amer  ?Date Value Ref Range Status  ?05/08/2020 95 >59 mL/min/1.73 Final  ?  Comment:  ?  **[facility] currently reports eGFR in compliance with the current** ?  recommendations of the [employer]. [facility] will ?  update reporting as new guidelines are published from the NKF-ASN ?  Task force. ?  ? ?GFR calc non Af Amer  ?Date Value Ref Range Status  ?05/08/2020 82 >59 mL/min/1.73 Final  ? ?eGFR  ?Date Value Ref Range Status  ?11/23/2021 87 >59 mL/min/1.73 Final  ?  ?  ?  ?  Passed - Valid encounter within last 6 months  ?  Recent Outpatient Visits   ? ?      ? 3 days ago Type 2 diabetes mellitus with obesity ([place])  ? [place], [place] T, NP  ? 1 month ago Gastroesophageal reflux disease without esophagitis  ? [place], [place] T, NP  ? 3 months ago Type 2 diabetes mellitus with obesity ([place])  ? [place], [place] T, NP  ? 6 months  ago Type 2 diabetes mellitus with obesity ([place])  ? [place], [place]hal[place] T, NP  ? 9 months ago Type 2 diabetes mellitus with obesity ([place])  ? [facility] [place], Henrine Screws T, NP  ? ?  ?  ?Future Appointments   ? ?        ? In 1 month Cannady, Barbaraann Faster, NP [facility]  ? ?  ? ?  ?  ?  ?  ? ?

## 2021-11-26 NOTE — Telephone Encounter (Signed)
Medication Refill - Medication:  ?empagliflozin (JARDIANCE) 25 MG TABS tablet  ? ?Has the patient contacted their pharmacy? Yes.   ?Contact PCP- *Pt states she is completely out* ? ?Preferred Pharmacy (with phone number or street name):  ?[id number] [place] [facility] [address]  [place] [id number] ?Phone:  [phone] Fax:  [phone] ? ?Has the patient been seen for an appointment in the last year OR does the patient have an upcoming appointment? Yes.   ? ?Agent: Please be advised that RX refills may take up to 3 business days. We ask that you follow-up with your pharmacy. ?

## 2021-11-26 NOTE — Telephone Encounter (Signed)
Requested Prescriptions  ?Pending Prescriptions Disp Refills  ?? albuterol (VENTOLIN HFA) 108 (90 Base) MCG/ACT inhaler [Pharmacy Med Name: ALBUTEROL HFA (VENTOLIN) INH] 18 each 2  ?  Sig: TAKE 2 PUFFS BY MOUTH EVERY 6 HOURS AS NEEDED FOR WHEEZE OR SHORTNESS OF BREATH  ?  ? Pulmonology:  Beta Agonists 2 Passed - 11/26/2021  9:03 AM  ?  ?  Passed - Last BP in normal range  ?  BP Readings from Last 1 Encounters:  ?11/23/21 118/65  ?   ?  ?  Passed - Last Heart Rate in normal range  ?  Pulse Readings from Last 1 Encounters:  ?11/23/21 75  ?   ?  ?  Passed - Valid encounter within last 12 months  ?  Recent Outpatient Visits   ?      ? 3 days ago Type 2 diabetes mellitus with obesity ([place])  ? [place], [place] T, NP  ? 1 month ago Gastroesophageal reflux disease without esophagitis  ? [place], [place] T, NP  ? 3 months ago Type 2 diabetes mellitus with obesity ([place])  ? [place], [place] T, NP  ? 6 months ago Type 2 diabetes mellitus with obesity ([place])  ? [place], [place] T, NP  ? 9 months ago Type 2 diabetes mellitus with obesity ([place])  ? [facility] [place], Henrine Screws T, NP  ?  ?  ?Future Appointments   ?        ? In 1 month Cannady, Barbaraann Faster, NP [facility]  ?  ? ?  ?  ?  ?? traZODone (DESYREL) 50 MG tablet [Pharmacy Med Name: TRAZODONE 50 MG TABLET] 90 tablet 0  ?  Sig: TAKE 1/2 TO 1 TABLET BY MOUTH AT BEDTIME AS NEEDED FOR SLEEP  ?  ? Psychiatry: Antidepressants - Serotonin Modulator Passed - 11/26/2021  9:03 AM  ?  ?  Passed - Valid encounter within last 6 months  ?  Recent Outpatient Visits   ?      ? 3 days ago Type 2 diabetes mellitus with obesity ([place])  ? [place], [place] T, NP  ? 1 month ago Gastroesophageal reflux disease without esophagitis  ? [place], [place] T, NP  ? 3 months ago Type 2 diabetes mellitus with obesity ([place])  ? [place], [place] T, NP  ? 6 months ago Type 2 diabetes mellitus with obesity ([place])  ? [place], [place] T, NP  ? 9 months ago Type 2 diabetes mellitus with obesity ([place])  ? [facility] [place], Henrine Screws T, NP  ?  ?  ?Future Appointments   ?        ? In 1 month Cannady, Barbaraann Faster, NP [facility]  ?  ? ?  ?  ?  ? ? ?

## 2021-11-26 NOTE — Telephone Encounter (Signed)
Patient states as per her pharmacist theres no refills on file and a new script has to be sent. Patient would like a 90 day supply sent to her pharmacy today because she is traveling out of town.  ? ? ?[id number] [place] [facility] [address] Phone:  [phone] ?Fax:  [phone] ?  ? ?

## 2021-12-11 ENCOUNTER — Other Ambulatory Visit: Payer: Self-pay | Admitting: Nurse Practitioner

## 2021-12-14 ENCOUNTER — Other Ambulatory Visit: Payer: Self-pay | Admitting: Nurse Practitioner

## 2021-12-14 MED ORDER — TIRZEPATIDE 2.5 MG/0.5ML ~~LOC~~ SOAJ: 2.5000 mg | SUBCUTANEOUS | 3 refills | Status: DC

## 2021-12-21 ENCOUNTER — Ambulatory Visit: Payer: Medicare Other | Admitting: Physician Assistant

## 2021-12-22 ENCOUNTER — Encounter: Payer: Self-pay | Admitting: Nurse Practitioner

## 2021-12-22 ENCOUNTER — Ambulatory Visit (INDEPENDENT_AMBULATORY_CARE_PROVIDER_SITE_OTHER): Payer: Medicare Other | Admitting: Nurse Practitioner

## 2021-12-22 VITALS — BP 111/67 | HR 73 | Temp 97.8°F | Ht 62.0 in | Wt 164.8 lb

## 2021-12-22 DIAGNOSIS — R051 Acute cough: Secondary | ICD-10-CM | POA: Diagnosis not present

## 2021-12-22 DIAGNOSIS — E1169 Type 2 diabetes mellitus with other specified complication: Secondary | ICD-10-CM

## 2021-12-22 DIAGNOSIS — E669 Obesity, unspecified: Secondary | ICD-10-CM

## 2021-12-22 MED ORDER — HYDROCOD POLI-CHLORPHE POLI ER 10-8 MG/5ML PO SUER: 5.0000 mL | Freq: Two times a day (BID) | ORAL | 0 refills | Status: DC | PRN

## 2021-12-22 MED ORDER — AZITHROMYCIN 250 MG PO TABS: ORAL_TABLET | ORAL | 0 refills | Status: AC

## 2021-12-22 MED ORDER — TIRZEPATIDE 5 MG/0.5ML ~~LOC~~ SOAJ: 5.0000 mg | SUBCUTANEOUS | 4 refills | Status: DC

## 2021-12-22 NOTE — Patient Instructions (Signed)
Tirzepatide Injection ?What is this medication? ?TIRZEPATIDE (tir ZEP a tide) treats type 2 diabetes. It works by increasing insulin levels in your body, which decreases your blood sugar (glucose). Changes to diet and exercise are often combined with this medication. ?This medicine may be used for other purposes; ask your health care provider or pharmacist if you have questions. ?COMMON BRAND NAME(S): MOUNJARO ?What should I tell my care team before I take this medication? ?They need to know if you have any of these conditions: ?Endocrine tumors (MEN 2) or if someone in your family had these tumors ?Eye disease, vision problems ?Gallbladder disease ?History of pancreatitis ?Kidney disease ?Stomach or intestine problems ?Thyroid cancer or if someone in your family had thyroid cancer ?An unusual or allergic reaction to tirzepatide, other medications, foods, dyes, or preservatives ?Pregnant or trying to get pregnant ?Breast-feeding ?How should I use this medication? ?This medication is injected under the skin. You will be taught how to prepare and give it. It is given once every week (every 7 days). Keep taking it unless your health care provider tells you to stop. ?If you use this medication with insulin, you should inject this medication and the insulin separately. Do not mix them together. Do not give the injections right next to each other. Change (rotate) injection sites with each injection. ?This medication comes with INSTRUCTIONS FOR USE. Ask your pharmacist for directions on how to use this medication. Read the information carefully. Talk to your pharmacist or care team if you have questions. ?It is important that you put your used needles and syringes in a special sharps container. Do not put them in a trash can. If you do not have a sharps container, call your pharmacist or care team to get one. ?A special MedGuide will be given to you by the pharmacist with each prescription and refill. Be sure to read this  information carefully each time. ?Talk to your care team about the use of this medication in children. Special care may be needed. ?Overdosage: If you think you have taken too much of this medicine contact a poison control center or emergency room at once. ?NOTE: This medicine is only for you. Do not share this medicine with others. ?What if I miss a dose? ?If you miss a dose, take it as soon as you can unless it is more than 4 days (96 hours) late. If it is more than 4 days late, skip the missed dose. Take the next dose at the normal time. Do not take 2 doses within 3 days of each other. ?What may interact with this medication? ?Alcohol containing beverages ?Antiviral medications for HIV or AIDS ?Aspirin and aspirin-like medications ?Beta-blockers like atenolol, metoprolol, propranolol ?Certain medications for blood pressure, heart disease, irregular heart beat ?Chromium ?Clonidine ?Diuretics ?Female hormones, such as estrogens or progestins, birth control pills ?Fenofibrate ?Gemfibrozil ?Guanethidine ?Isoniazid ?Lanreotide ?Female hormones or anabolic steroids ?MAOIs like Carbex, Eldepryl, Marplan, Nardil, and Parnate ?Medications for weight loss ?Medications for allergies, asthma, cold, or cough ?Medications for depression, anxiety, or psychotic disturbances ?Niacin ?Nicotine ?NSAIDs, medications for pain and inflammation, like ibuprofen or naproxen ?Octreotide ?Other medications for diabetes, like glyburide, glipizide, or glimepiride ?Pasireotide ?Pentamidine ?Phenytoin ?Probenecid ?Quinolone antibiotics such as ciprofloxacin, levofloxacin, ofloxacin ?Reserpine ?Some herbal dietary supplements ?Steroid medications such as prednisone or cortisone ?Sulfamethoxazole; trimethoprim ?Thyroid hormones ?Warfarin ?This list may not describe all possible interactions. Give your health care provider a list of all the medicines, herbs, non-prescription drugs, or dietary supplements you  use. Also tell them if you smoke, drink  alcohol, or use illegal drugs. Some items may interact with your medicine. ?What should I watch for while using this medication? ?Visit your care team for regular checks on your progress. ?Drink plenty of fluids while taking this medication. Check with your care team if you get an attack of severe diarrhea, nausea, and vomiting. The loss of too much body fluid can make it dangerous for you to take this medication. ?A test called the HbA1C (A1C) will be monitored. This is a simple blood test. It measures your blood sugar control over the last 2 to 3 months. You will receive this test every 3 to 6 months. ?Learn how to check your blood sugar. Learn the symptoms of low and high blood sugar and how to manage them. ?Always carry a quick-source of sugar with you in case you have symptoms of low blood sugar. Examples include hard sugar candy or glucose tablets. Make sure others know that you can choke if you eat or drink when you develop serious symptoms of low blood sugar, such as seizures or unconsciousness. They must get medical help at once. ?Tell your care team if you have high blood sugar. You might need to change the dose of your medication. If you are sick or exercising more than usual, you might need to change the dose of your medication. ?Do not skip meals. Ask your care team if you should avoid alcohol. Many nonprescription cough and cold products contain sugar or alcohol. These can affect blood sugar. ?Pens should never be shared. Even if the needle is changed, sharing may result in passing of viruses like hepatitis or HIV. ?Wear a medical ID bracelet or chain, and carry a card that describes your disease and details of your medication and dosage times. ?Birth control may not work properly while you are taking this medication. If you take birth control pills by mouth, your care team may recommend another type of birth control for 4 weeks after you start this medication and for 4 weeks after each increase in  your dose of this medication. Ask your care team which birth control methods you should use. ?What side effects may I notice from receiving this medication? ?Side effects that you should report to your care team as soon as possible: ?Allergic reactions--skin rash, itching, hives, swelling of the face, lips, tongue, or throat ?Change in vision ?Dehydration--increased thirst, dry mouth, feeling faint or lightheaded, headache, dark yellow or brown urine ?Gallbladder problems--severe stomach pain, nausea, vomiting, fever ?Kidney injury--decrease in the amount of urine, swelling of the ankles, hands, or feet ?Pancreatitis--severe stomach pain that spreads to your back or gets worse after eating or when touched, fever, nausea, vomiting ?Thyroid cancer--new mass or lump in the neck, pain or trouble swallowing, trouble breathing, hoarseness ?Side effects that usually do not require medical attention (report these to your care team if they continue or are bothersome): ?Constipation ?Diarrhea ?Loss of Appetite ?Nausea ?Stomach pain ?Upset stomach ?Vomiting ?This list may not describe all possible side effects. Call your doctor for medical advice about side effects. You may report side effects to FDA at 1-800-FDA-1088. ?Where should I keep my medication? ?Keep out of the reach of children and pets. ?Refrigeration (preferred): Store unopened pens in a refrigerator between 2 and 8 degrees C (36 and 46 degrees F). Keep it in the original carton until you are ready to take it. Do not freeze or use if the medication has been frozen.  Protect from light. Get rid of any unused medication after the expiration date on the label. ?Room Temperature: The pen may be stored at room temperature below 30 degrees C (86 degrees F) for up to a total of 21 days if needed. Protect from light. Avoid exposure to extreme heat. If it is stored at room temperature, throw away any unused medication after 21 days or after it expires, whichever is  first. ?The pen has glass parts. Handle it carefully. If you drop the pen on a hard surface, do not use it. Use a new pen for your injection. ?To get rid of medications that are no longer needed or have expired:

## 2021-12-22 NOTE — Assessment & Plan Note (Signed)
Acute for 2 weeks.  Will obtain Covid, flu, and strep testing today.  She is past period for Covid treatment, made her aware of this.  Start Azithromycin and Tussionex.  Recommend: ?- Increased rest ?- Increasing Fluids ?- Acetaminophen needed for fever/pain.  ?- Salt water gargling, chloraseptic spray and throat lozenges ?- Mucinex.  ?- Humidifying the air. ?Return to office if worsening or ongoing.  Obtain imaging if ongoing. ?

## 2021-12-22 NOTE — Assessment & Plan Note (Signed)
Chronic, ongoing.  Urine ALB 30 October 2021.  A1c recent visit 8.2% which is increased from previous 7.5%. We started [facility], no reduction in sugars at this time, will increase to 5 MG weekly.  Continue Metformin and Jardiance at max doses.  Check blood sugar 3 times a day and document for provider.  Recommend eye exam.  Return in 2 months. ?

## 2021-12-22 NOTE — Progress Notes (Signed)
Contacted via [place] ? ? ?Strep and flu testing is negative:)

## 2021-12-22 NOTE — Progress Notes (Signed)
? ?BP 111/67   Pulse 73   Temp 97.8 ?F (36.6 ?C) (Oral)   Ht 5' 2"  (1.575 m)   Wt 164 lb 12.8 oz (74.8 kg)   LMP 07/13/2000 (Approximate)   SpO2 98%   BMI 30.14 kg/m?   ? ?Subjective:  ? ? Patient ID: Charlene Juarez, female    DOB: 1951/03/06, 71 y.o.   MRN: [id number] ? ?HPI: ?Charlene Juarez is a 71 y.o. female ? ?Chief Complaint  ?Patient presents with  ? Sore Throat  ?  Patient states she has a lot of drainage that drains at night and it worse at night. Patient thinks that may be the cause of her sore throat along with the coughing. Patient says she does drink a lot of water.   ? Diabetes  ?  Patient states she has started the new medication but doesn't feel any difference and she says that she knows her levels have been high. Patient states she feels since she was out of town and eating what they eat she knows she would not have high readings.   ? Cough  ?  Patient states she has been sick for the past two weeks. Patient states she took the train to travel and says everyone was coughing and sickly on the train. Patient states she has tried over the counter medication like Alker and tried some cough drops. Patient states she does not think it helped her.   ? ?DIABETES ?Last A1c was 8.2% in February due to coming off Keto and eating more of her baked goods -- taken off Ozempic and started on [place].  Currently continues Jardiance 25 MG and Metformin 1000 MG BID.  ?Hypoglycemic episodes:no ?Polydipsia/polyuria: no ?Visual disturbance: no ?Chest pain: no ?Paresthesias: no ?Glucose Monitoring: no ?            Accucheck frequency: daily ?            Fasting glucose: 200 ?            Post prandial: ?            Evening:  ?            Before meals: ?Taking Insulin?: no ?            Long acting insulin: ?            Short acting insulin: ?Blood Pressure Monitoring: yes ?Retinal Examination: Not Up to Date -- not yet due to cost ?Foot Exam: Up to Date ?Pneumovax: Up to Date ?Influenza: Up to  Date ?Aspirin: no  ?  ?UPPER RESPIRATORY TRACT INFECTION ?For two weeks, had gone on the train and around plenty of sick people at that time.  Woke-up at time with sore throat and this progressed into further symptoms.  Has not been Covid vaccinated.  Did not Covid test.   ?Worst symptom: dry cough and itchy throat ?Fever: no ?Cough: yes ?Shortness of breath:  initially, this has improved ?Wheezing: initially, this has improved ?Chest pain: no ?Chest tightness: no ?Chest congestion: yes ?Nasal congestion: yes ?Runny nose: yes ?Post nasal drip: yes ?Sneezing: no ?Sore throat: yes ?Swollen glands: no ?Sinus pressure: yes a week ago ?Headache: yes a  week ago ?Face pain: no ?Toothache: no ?Ear pain: yes bilateral ?Ear pressure: yes bilateral ?Eyes red/itching:no ?Eye drainage/crusting: no  ?Vomiting: no ?Rash: no ?Fatigue: yes ?Sick contacts: yes ?Strep contacts: no  ?Context: fluctuating ?Recurrent sinusitis: no ?Relief with OTC cold/cough medications: yes  ?  Treatments attempted: Alka Seltzer Plus, cough syrup ? ?Relevant past medical, surgical, family and social history reviewed and updated as indicated. Interim medical history since our last visit reviewed. ?Allergies and medications reviewed and updated. ? ?Review of Systems  ?Constitutional:  Positive for chills and fatigue. Negative for activity change, appetite change and fever.  ?HENT:  Positive for congestion, postnasal drip, rhinorrhea, sinus pressure, sinus pain and sore throat. Negative for ear discharge, ear pain, facial swelling, sneezing and voice change.   ?Respiratory:  Positive for cough and chest tightness. Negative for shortness of breath and wheezing.   ?Cardiovascular:  Negative for chest pain, palpitations and leg swelling.  ?Gastrointestinal: Negative.   ?Endocrine: Negative.   ?Musculoskeletal:  Negative for myalgias.  ?Neurological:  Positive for headaches. Negative for dizziness and numbness.  ?Psychiatric/Behavioral: Negative.    ? ?Per HPI  unless specifically indicated above ? ?   ?Objective:  ?  ?BP 111/67   Pulse 73   Temp 97.8 ?F (36.6 ?C) (Oral)   Ht 5' 2"  (1.575 m)   Wt 164 lb 12.8 oz (74.8 kg)   LMP 07/13/2000 (Approximate)   SpO2 98%   BMI 30.14 kg/m?   ?Wt Readings from Last 3 Encounters:  ?12/22/21 164 lb 12.8 oz (74.8 kg)  ?11/23/21 164 lb 3.2 oz (74.5 kg)  ?10/23/21 164 lb 12.8 oz (74.8 kg)  ?  ?Physical Exam ?Vitals and nursing note reviewed.  ?Constitutional:   ?   General: She is awake. She is not in acute distress. ?   Appearance: She is well-developed and well-groomed. She is obese. She is not ill-appearing or toxic-appearing.  ?HENT:  ?   Head: Normocephalic.  ?   Right Ear: Hearing, ear canal and external ear normal. No tenderness. A middle ear effusion is present.  ?   Left Ear: Hearing, ear canal and external ear normal. No tenderness. A middle ear effusion is present.  ?   Nose: Rhinorrhea present. Rhinorrhea is clear.  ?   Right Turbinates: Swollen.  ?   Left Turbinates: Swollen.  ?   Right Sinus: Maxillary sinus tenderness present. No frontal sinus tenderness.  ?   Left Sinus: Maxillary sinus tenderness present. No frontal sinus tenderness.  ?   Mouth/Throat:  ?   Mouth: Mucous membranes are moist.  ?   Pharynx: Posterior oropharyngeal erythema (mild with cobblestone pattern) present. No pharyngeal swelling or oropharyngeal exudate.  ?   Tonsils: 1+ on the right. 1+ on the left.  ?Eyes:  ?   General: Lids are normal.     ?   Right eye: No discharge.     ?   Left eye: No discharge.  ?   Conjunctiva/sclera: Conjunctivae normal.  ?   Pupils: Pupils are equal, round, and reactive to light.  ?Neck:  ?   Thyroid: No thyromegaly.  ?   Vascular: No carotid bruit.  ?Cardiovascular:  ?   Rate and Rhythm: Normal rate and regular rhythm.  ?   Heart sounds: Normal heart sounds. No murmur heard. ?  No gallop.  ?Pulmonary:  ?   Effort: Pulmonary effort is normal. No accessory muscle usage or respiratory distress.  ?   Breath sounds:  Normal breath sounds.  ?Abdominal:  ?   General: Bowel sounds are normal.  ?   Palpations: Abdomen is soft. There is no hepatomegaly or splenomegaly.  ?Musculoskeletal:  ?   Cervical back: Normal range of motion and neck supple.  ?   Right  lower leg: No edema.  ?   Left lower leg: No edema.  ?Lymphadenopathy:  ?   Cervical: No cervical adenopathy.  ?Skin: ?   General: Skin is warm and dry.  ?Neurological:  ?   Mental Status: She is alert and oriented to person, place, and time.  ?Psychiatric:     ?   Attention and Perception: Attention normal.     ?   Mood and Affect: Mood normal.     ?   Speech: Speech normal.     ?   Behavior: Behavior normal. Behavior is cooperative.     ?   Thought Content: Thought content normal.  ? ?Results for orders placed or performed in visit on 11/23/21  ?Bayer DCA Hb A1c Waived  ?Result Value Ref Range  ? HB A1C (BAYER DCA - WAIVED) 8.2 (H) 4.8 - 5.6 %  ?Microalbumin, Urine Waived  ?Result Value Ref Range  ? Microalb, Ur Waived 10 0 - 19 mg/L  ? Creatinine, Urine Waived 50 10 - 300 mg/dL  ? Microalb/Creat Ratio <30 <30 mg/g  ?Comprehensive metabolic panel  ?Result Value Ref Range  ? Glucose 156 (H) 70 - 99 mg/dL  ? BUN 16 8 - 27 mg/dL  ? Creatinine, Ser 0.74 0.57 - 1.00 mg/dL  ? eGFR 87 >59 mL/min/1.73  ? BUN/Creatinine Ratio 22 12 - 28  ? Sodium 137 134 - 144 mmol/L  ? Potassium 4.0 3.5 - 5.2 mmol/L  ? Chloride 97 96 - 106 mmol/L  ? CO2 26 20 - 29 mmol/L  ? Calcium 9.5 8.7 - 10.3 mg/dL  ? Total Protein 6.9 6.0 - 8.5 g/dL  ? Albumin 4.6 3.8 - 4.8 g/dL  ? Globulin, Total 2.3 1.5 - 4.5 g/dL  ? Albumin/Globulin Ratio 2.0 1.2 - 2.2  ? Bilirubin Total 0.3 0.0 - 1.2 mg/dL  ? Alkaline Phosphatase 62 44 - 121 IU/L  ? AST 40 0 - 40 IU/L  ? ALT 67 (H) 0 - 32 IU/L  ?Lipid Panel w/o Chol/HDL Ratio  ?Result Value Ref Range  ? Cholesterol, Total 135 100 - 199 mg/dL  ? Triglycerides 131 0 - 149 mg/dL  ? HDL 53 >39 mg/dL  ? VLDL Cholesterol Cal 23 5 - 40 mg/dL  ? LDL Chol Calc (NIH) 59 0 - 99 mg/dL   ?VITAMIN D 25 Hydroxy (Vit-D Deficiency, Fractures)  ?Result Value Ref Range  ? Vit D, 25-Hydroxy 43.2 30.0 - 100.0 ng/mL  ? ?   ?Assessment & Plan:  ? ?Problem List Items Addressed This Visit   ? ?  ? Endocrine  ? Type 2

## 2021-12-23 LAB — NOVEL CORONAVIRUS, NAA: SARS-CoV-2, NAA: NOT DETECTED

## 2021-12-23 NOTE — Progress Notes (Signed)
Contacted via MyChart   Covid testing is negative!!

## 2021-12-25 LAB — CULTURE, GROUP A STREP: Strep A Culture: NEGATIVE

## 2021-12-25 LAB — VERITOR FLU A/B WAIVED
Influenza A: NEGATIVE
Influenza B: NEGATIVE

## 2021-12-25 LAB — RAPID STREP SCREEN (MED CTR MEBANE ONLY): Strep Gp A Ag, IA W/Reflex: NEGATIVE

## 2021-12-28 ENCOUNTER — Ambulatory Visit: Payer: Self-pay | Admitting: *Deleted

## 2021-12-28 MED ORDER — AMOXICILLIN-POT CLAVULANATE 875-125 MG PO TABS: 1.0000 | ORAL_TABLET | Freq: Two times a day (BID) | ORAL | 0 refills | Status: DC

## 2021-12-28 NOTE — Telephone Encounter (Signed)
?  Chief Complaint: bilateral ear pain and sore throat.   Requesting a refill on Zithromax.  Requesting not to come in due to another appt.   (Seen 12/22/2021 for same). ?Symptoms: bilateral ear pain and pressure, sore throat ?Frequency: Since seen on 12/22/2021 not better ?Pertinent Negatives: Patient denies fever, cough has resolved. ?Disposition: '[]'$ ED /'[]'$ Urgent Care (no appt availability in office) / '[]'$ Appointment(In office/virtual)/ '[]'$  [facility]/ '[]'$ Home Care/ '[]'$ Refused Recommended Disposition /'[]'$ [facility]/ '[x]'$  Follow-up with PCP ?Additional Notes: Message sent to Marnee Guarneri, NP.   Pt agreeable to being called back.    ?

## 2021-12-28 NOTE — Telephone Encounter (Signed)
Patient notified and verbalized understanding. 

## 2021-12-28 NOTE — Telephone Encounter (Signed)
I returned pt's call.   C/o not feeling  better after taking Zithromax 250 mg.  Seen 12/22/2021 however pt is not feeling better.   C/o earache in both ears, sore throat.  Negative for flu, Covid and Strep.  Requesting advice. ? ? ? ? ?Reason for Disposition ? Earache  (Exceptions: brief ear pain of < 60 minutes duration, earache occurring during air travel ?   Seen on 12/22/2021  Finished course of Zithromax.   Pain in both ears and sore throat.   Fever and cough resolved. ? ?Answer Assessment - Initial Assessment Questions ?1. LOCATION: "Which ear is involved?" ?    Earache in both ears. ?2. ONSET: "When did the ear start hurting"  ?    Had when seen 12/22/2021.   I have fluid behind my ears.     ?Sore throat too.   No fever or cough.    ?3. SEVERITY: "How bad is the pain?"  (Scale 1-10; mild, moderate or severe) ?  - MILD (1-3): doesn't interfere with normal activities  ?  - MODERATE (4-7): interferes with normal activities or awakens from sleep  ?  - SEVERE (8-10): excruciating pain, unable to do any normal activities  ?    *No Answer* ?4. URI SYMPTOMS: "Do you have a runny nose or cough?" ?    No ?5. FEVER: "Do you have a fever?" If Yes, ask: "What is your temperature, how was it measured, and when did it start?" ?    No ?6. CAUSE: "Have you been swimming recently?", "How often do you use Q-TIPS?", "Have you had any recent air travel or scuba diving?" ?    *No Answer* ?7. OTHER SYMPTOMS: "Do you have any other symptoms?" (e.g., headache, stiff neck, dizziness, vomiting, runny nose, decreased hearing) ?    Sore throat.    I feel like drainage going down the back of throat. ?8. PREGNANCY: "Is there any chance you are pregnant?" "When was your last menstrual period?" ?    *No Answer* ? ?Protocols used: Earache-A-AH ? ?

## 2021-12-28 NOTE — Telephone Encounter (Signed)
Routing to provider to advise.  

## 2021-12-28 NOTE — Addendum Note (Signed)
Addended by: Marnee Guarneri T on: 12/28/2021 10:48 AM ? ? Modules accepted: Orders ? ?

## 2021-12-30 DIAGNOSIS — E113293 Type 2 diabetes mellitus with mild nonproliferative diabetic retinopathy without macular edema, bilateral: Secondary | ICD-10-CM | POA: Diagnosis not present

## 2021-12-30 LAB — HM DIABETES EYE EXAM

## 2021-12-31 ENCOUNTER — Ambulatory Visit: Payer: Medicare Other | Admitting: Nurse Practitioner

## 2022-01-04 ENCOUNTER — Other Ambulatory Visit: Payer: Self-pay

## 2022-01-04 ENCOUNTER — Ambulatory Visit (INDEPENDENT_AMBULATORY_CARE_PROVIDER_SITE_OTHER): Payer: Medicare Other | Admitting: Gastroenterology

## 2022-01-04 ENCOUNTER — Encounter: Payer: Self-pay | Admitting: Gastroenterology

## 2022-01-04 VITALS — BP 166/73 | HR 99 | Temp 98.3°F | Ht 62.0 in | Wt 167.2 lb

## 2022-01-04 DIAGNOSIS — K219 Gastro-esophageal reflux disease without esophagitis: Secondary | ICD-10-CM | POA: Diagnosis not present

## 2022-01-04 DIAGNOSIS — R1310 Dysphagia, unspecified: Secondary | ICD-10-CM | POA: Diagnosis not present

## 2022-01-04 MED ORDER — PANTOPRAZOLE SODIUM 20 MG PO TBEC: 20.0000 mg | DELAYED_RELEASE_TABLET | Freq: Two times a day (BID) | ORAL | 3 refills | Status: DC

## 2022-01-04 NOTE — Progress Notes (Signed)
?  ?Charlene Bellows MD, MRCP(U.K) ?[address]  ?[address]  ?[place], [place] [address]  ?Main: [phone]  ?Fax: [phone] ? ? ?Gastroenterology Consultation ? ?Referring Provider:     Venita Lick, NP ?Primary Care Physician:  Charlene Lick, NP ?Primary Gastroenterologist:  Dr. Jonathon Juarez  ?Reason for Consultation:   GERD ?      ? HPI:   ?Charlene Juarez is a 71 y.o. y/o female referred for consultation & management  by  Charlene Lick, NP.   ? ?10/23/2021: Hemoglobin 15.1 g ? ?She states that she has had issues with regurgitation and heartburn for a few years.  Recently got worse after she was started on Ozempic.  She feels like the food sits in the stomach for long.  Of time and has regurgitation when she particularly lays flat at night after dinner.  She was started on Protonix 20 mg once a day which helped but not completely.  She is also had difficulty swallowing solids which gets stuck at time over the past few months if not longer.  Denies any unintentional weight loss.  Her Ozempic has been changed to [facility].  Does not recall having a prior upper endoscopy. ? ? ?Past Medical History:  ?Diagnosis Date  ? Arthritis   ? Bursitis   ? Cancer [facility])   ? SKIN  ? Diabetes mellitus without complication ([place])   ? GERD (gastroesophageal reflux disease)   ? Hyperlipidemia   ? Hypertension   ? Sleep apnea   ? ? ?Past Surgical History:  ?Procedure Laterality Date  ? cancer removal off nose    ? CATARACT EXTRACTION W/PHACO Right 01/13/2017  ? Procedure: CATARACT EXTRACTION PHACO AND INTRAOCULAR LENS PLACEMENT (IOC);  Surgeon: Eulogio Bear, MD;  Location: [facility] ORS;  Service: Ophthalmology;  Laterality: Right;  [place] 31.1 ?AP% 0.0 ?CDE 1.78 ?Fluid Pack lot # [id number]  ? CATARACT EXTRACTION W/PHACO Left 02/24/2017  ? Procedure: CATARACT EXTRACTION PHACO AND INTRAOCULAR LENS PLACEMENT (IOC);  Surgeon: Eulogio Bear, MD;  Location: [facility] ORS;  Service: Ophthalmology;  Laterality: Left;  [place]   00:45.5 ?AP  0.7 ?CDE   2.76 ? fluid pack lot # [id number]  exp. 08-19-2018  ? DILATION AND CURETTAGE OF UTERUS    ? TUBAL LIGATION    ? ? ?Prior to Admission medications   ?Medication Sig Start Date End Date Taking? Authorizing Provider  ?albuterol (VENTOLIN HFA) 108 (90 Base) MCG/ACT inhaler TAKE 2 PUFFS BY MOUTH EVERY 6 HOURS AS NEEDED FOR WHEEZE OR SHORTNESS OF BREATH 11/26/21  Yes Charlene Lick, NP  ?Ascorbic Acid (VITAMIN C) 1000 MG tablet Take 1,000 mg by mouth daily.   Yes [provider]  ?Cholecalciferol (VITAMIN D3) 1000 units CAPS Take 1,000 Units by mouth at bedtime.   Yes [provider]  ?empagliflozin (JARDIANCE) 25 MG TABS tablet Take 1 tablet (25 mg total) by mouth daily. 11/26/21  Yes Charlene Lick, NP  ?Ginkgo Biloba 40 MG TABS Take 1 tablet by mouth daily.   Yes [provider]  ?glucose blood (BAYER CONTOUR TEST) test strip Contour test strips ( or insurance preferred) used to check blood sugar BID. DX: E11.9 06/21/18  Yes Johnson, Megan P, DO  ?glucose blood test strip 1 each by Other route as needed for other. Use as instructed   Yes [provider]  ?hydrochlorothiazide (HYDRODIURIL) 25 MG tablet Take 1 tablet (25 mg total) by mouth daily. 02/12/21  Yes Cannady, Henrine Screws  T, NP  ?Magnesium 500 MG TABS Take 500 mg by mouth at bedtime.   Yes [provider]  ?metFORMIN (GLUCOPHAGE) 1000 MG tablet Take 1 tablet (1,000 mg total) by mouth 2 (two) times daily with a meal. 02/12/21  Yes Cannady, Barbaraann Faster, NP  ?Omega-3 Fatty Acids (FISH OIL) 1200 MG CAPS Take 1,200-2,400 mg by mouth 2 (two) times daily. 1 in the am and 2 at night   Yes [provider]  ?pantoprazole (PROTONIX) 20 MG tablet Take 1 tablet (20 mg total) by mouth daily. 10/23/21  Yes Charlene Lick, NP  ?rosuvastatin (CRESTOR) 20 MG tablet Take 1 tablet (20 mg total) by mouth daily. 02/12/21  Yes Cannady, Jolene T, NP  ?SYMBICORT 160-4.5 MCG/ACT inhaler TAKE 2 PUFFS BY MOUTH TWICE A DAY  11/19/20  Yes Cannady, Jolene T, NP  ?telmisartan (MICARDIS) 80 MG tablet Take 1 tablet (80 mg total) by mouth daily. 02/12/21  Yes Cannady, Henrine Screws T, NP  ?tirzepatide (MOUNJARO) 5 MG/0.5ML Pen Inject 5 mg into the skin once a week. 12/22/21  Yes Charlene Lick, NP  ?Ivermectin 1 % CREA Apply topically daily. ?Patient not taking: Reported on 01/04/2022 05/22/20   [provider]  ? ? ?Family History  ?Problem Relation Age of Onset  ? Alcohol abuse Mother   ? Heart disease Father   ? Alcohol abuse Father   ? Diabetes Sister   ? Hyperlipidemia Sister   ? Diabetes Brother   ? Hypertension Brother   ? Thyroid disease Daughter   ? Diabetes Maternal Grandmother   ? Hypertension Sister   ? Aneurysm Sister   ? Diabetes Sister   ? Diabetes Sister   ? Breast cancer Maternal Aunt   ?  ? ?Social History  ? ?Tobacco Use  ? Smoking status: Never  ? Smokeless tobacco: Never  ?Vaping Use  ? Vaping Use: Never used  ?Substance Use Topics  ? Alcohol use: No  ?  Alcohol/week: 0.0 standard drinks  ? Drug use: Never  ? ? ?Allergies as of 01/04/2022 - Review Complete 01/04/2022  ?Allergen Reaction Noted  ? Codeine sulfate Diarrhea and Nausea And Vomiting 04/08/2015  ? Lisinopril Cough 04/08/2015  ? ? ?Review of Systems:    ?All systems reviewed and negative except where noted in HPI. ? ? Physical Exam:  ?BP (!) 166/73 (Cuff Size: Normal)   Pulse 99   Temp 98.3 ?F (36.8 ?C) (Oral)   Ht '5\' 2"'$  (1.575 m)   Wt 167 lb 3.2 oz (75.8 kg)   LMP 07/13/2000 (Approximate)   BMI 30.58 kg/m?  ?Patient's last menstrual period was 07/13/2000 (approximate). ?Psych:  Alert and cooperative. Normal mood and affect. ?General:   Alert,  Well-developed, well-nourished, pleasant and cooperative in NAD ?Head:  Normocephalic and atraumatic. ?Eyes:  Sclera clear, no icterus.   Conjunctiva pink. ?Ears:  Normal auditory acuity. ?Neurologic:  Alert and oriented x3;  grossly normal neurologically. ?Psych:  Alert and cooperative. Normal mood and  affect. ? ?Imaging Studies: ?No results found. ? ?Assessment and Plan:  ? ?Charlene Juarez is a 70 y.o. y/o female has been referred for GERD.  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  She probably also has longstanding acid reflux.  Symptoms are partially controlled with Protonix.  She is on a low-dose presently.  She does have some history of dysphagia differentials include esophagitis versus stricture. ? ?Plan ?1.  Informed to have small meals more often rather than large meals at a  time.  Avoid eating for 2 to 3 hours before bedtime.  Have a low-fat diet at bedtime.  Increase Protonix to 20 mg twice daily. ?2.  We will perform EGD to rule out any gastric outlet obstruction as well as any strictures. ?3.  Gastroparesis can often occur due to the newer diabetes medication such as Ozempic and Mounjaro which can worsen reflux but hopefully the weight loss from the medication will also help reducing the episodes of reflux eventually ? ? ?I have discussed alternative options, risks & benefits,  which include, but are not limited to, bleeding, infection, perforation,respiratory complication & drug reaction.  The patient agrees with this plan & written consent will be obtained.   ? ? ?Follow up in 3 months ? ?Dr Charlene Bellows MD,MRCP(U.K) ? ?

## 2022-01-06 ENCOUNTER — Encounter: Payer: Self-pay | Admitting: Gastroenterology

## 2022-01-06 ENCOUNTER — Ambulatory Visit: Payer: Medicare Other | Admitting: Certified Registered"

## 2022-01-06 ENCOUNTER — Ambulatory Visit
Admission: RE | Admit: 2022-01-06 | Discharge: 2022-01-06 | Disposition: A | Discharge status: Home / Self Care | Payer: Medicare Other | Attending: Gastroenterology | Admitting: Gastroenterology

## 2022-01-06 ENCOUNTER — Encounter
Admission: RE | Disposition: A | Discharge status: Home / Self Care | Payer: Self-pay | Source: Home / Self Care | Attending: Gastroenterology

## 2022-01-06 DIAGNOSIS — Z7984 Long term (current) use of oral hypoglycemic drugs: Secondary | ICD-10-CM | POA: Diagnosis not present

## 2022-01-06 DIAGNOSIS — R1310 Dysphagia, unspecified: Secondary | ICD-10-CM | POA: Diagnosis not present

## 2022-01-06 DIAGNOSIS — G4730 Sleep apnea, unspecified: Secondary | ICD-10-CM | POA: Diagnosis not present

## 2022-01-06 DIAGNOSIS — E785 Hyperlipidemia, unspecified: Secondary | ICD-10-CM | POA: Insufficient documentation

## 2022-01-06 DIAGNOSIS — K2970 Gastritis, unspecified, without bleeding: Secondary | ICD-10-CM | POA: Diagnosis not present

## 2022-01-06 DIAGNOSIS — Z8249 Family history of ischemic heart disease and other diseases of the circulatory system: Secondary | ICD-10-CM | POA: Insufficient documentation

## 2022-01-06 DIAGNOSIS — Z833 Family history of diabetes mellitus: Secondary | ICD-10-CM | POA: Insufficient documentation

## 2022-01-06 DIAGNOSIS — K219 Gastro-esophageal reflux disease without esophagitis: Secondary | ICD-10-CM | POA: Insufficient documentation

## 2022-01-06 DIAGNOSIS — E119 Type 2 diabetes mellitus without complications: Secondary | ICD-10-CM | POA: Insufficient documentation

## 2022-01-06 DIAGNOSIS — J45909 Unspecified asthma, uncomplicated: Secondary | ICD-10-CM | POA: Diagnosis not present

## 2022-01-06 DIAGNOSIS — B9681 Helicobacter pylori [H. pylori] as the cause of diseases classified elsewhere: Secondary | ICD-10-CM | POA: Insufficient documentation

## 2022-01-06 DIAGNOSIS — Z85828 Personal history of other malignant neoplasm of skin: Secondary | ICD-10-CM | POA: Insufficient documentation

## 2022-01-06 DIAGNOSIS — L859 Epidermal thickening, unspecified: Secondary | ICD-10-CM | POA: Diagnosis not present

## 2022-01-06 DIAGNOSIS — I10 Essential (primary) hypertension: Secondary | ICD-10-CM | POA: Diagnosis not present

## 2022-01-06 DIAGNOSIS — K2950 Unspecified chronic gastritis without bleeding: Secondary | ICD-10-CM | POA: Insufficient documentation

## 2022-01-06 HISTORY — PX: ESOPHAGOGASTRODUODENOSCOPY (EGD) WITH PROPOFOL: SHX5813

## 2022-01-06 LAB — GLUCOSE, CAPILLARY: Glucose-Capillary: 180 mg/dL — ABNORMAL HIGH (ref 70–99)

## 2022-01-06 SURGERY — ESOPHAGOGASTRODUODENOSCOPY (EGD) WITH PROPOFOL
Anesthesia: General

## 2022-01-06 MED ORDER — LIDOCAINE HCL (CARDIAC) PF 100 MG/5ML IV SOSY
PREFILLED_SYRINGE | INTRAVENOUS | Status: DC | PRN
  Administered 2022-01-06: 50 mg via INTRAVENOUS

## 2022-01-06 MED ORDER — PROPOFOL 10 MG/ML IV BOLUS
INTRAVENOUS | Status: DC | PRN
  Administered 2022-01-06 (×2): 20 mg via INTRAVENOUS
  Administered 2022-01-06: 50 mg via INTRAVENOUS

## 2022-01-06 MED ORDER — SODIUM CHLORIDE 0.9 % IV SOLN
INTRAVENOUS | Status: DC
  Administered 2022-01-06: 1000 mL via INTRAVENOUS

## 2022-01-06 NOTE — Anesthesia Preprocedure Evaluation (Signed)
Anesthesia Evaluation  ?Patient identified by MRN, date of birth, ID band ?Patient awake ? ? ? ?Reviewed: ?Allergy & Precautions, NPO status , Patient's Chart, lab work & pertinent test results ? ?History of Anesthesia Complications ?Negative for: history of anesthetic complications ? ?Airway ?Mallampati: III ? ?TM Distance: >3 FB ?Neck ROM: full ? ? ? Dental ? ?(+) Poor Dentition, Missing, Implants ?  ?Pulmonary ?asthma , sleep apnea ,  ?  ?Pulmonary exam normal ? ? ? ? ? ? ? Cardiovascular ?Exercise Tolerance: Good ?hypertension, (-) anginaNormal cardiovascular exam ? ? ?  ?Neuro/Psych ?negative neurological ROS ? negative psych ROS  ? GI/Hepatic ?Neg liver ROS, GERD  Controlled,  ?Endo/Other  ?diabetes, Type 2 ? Renal/GU ?negative Renal ROS  ?negative genitourinary ?  ?Musculoskeletal ? ? Abdominal ?  ?Peds ? Hematology ?negative hematology ROS ?(+)   ?Anesthesia Other Findings ?Past Medical History: ?No date: Arthritis ?No date: Bursitis ?No date: Cancer [facility]) ?    Comment:  SKIN ?No date: Diabetes mellitus without complication ([place]) ?No date: GERD (gastroesophageal reflux disease) ?No date: Hyperlipidemia ?No date: Hypertension ?No date: Sleep apnea ? ?Past Surgical History: ?No date: cancer removal off nose ?01/13/2017: CATARACT EXTRACTION W/PHACO; Right ?    Comment:  Procedure: CATARACT EXTRACTION PHACO AND INTRAOCULAR  ?             LENS PLACEMENT ([place]);  Surgeon: Eulogio Bear, MD;   ?             Location: [facility] ORS;  Service: Ophthalmology;  Laterality: ?             Right;  [place] 31.1 ?AP% 0.0 ?CDE 1.78 ?Fluid Pack lot #  ?             [id number] ?02/24/2017: CATARACT EXTRACTION W/PHACO; Left ?    Comment:  Procedure: CATARACT EXTRACTION PHACO AND INTRAOCULAR  ?             LENS PLACEMENT ([place]);  Surgeon: Eulogio Bear, MD;   ?             Location: [facility] ORS;  Service: Ophthalmology;  Laterality: ?             Left;  [place]  00:45.5 ?AP  0.7 ?CDE   2.76 ? fluid pack lot   ?             # [id number]  exp. 08-19-2018 ?No date: DILATION AND CURETTAGE OF UTERUS ?No date: EYE SURGERY ?No date: rotator cuff surgery ?No date: TUBAL LIGATION ? ?BMI   ? Body Mass Index: 30.06 kg/m?  ?  ? ? Reproductive/Obstetrics ?negative OB ROS ? ?  ? ? ? ? ? ? ? ? ? ? ? ? ? ?  ?  ? ? ? ? ? ? ? ? ?Anesthesia Physical ?Anesthesia Plan ? ?ASA: 3 ? ?Anesthesia Plan: General  ? ?Post-op Pain Management:   ? ?Induction: Intravenous ? ?PONV Risk Score and Plan: Propofol infusion and TIVA ? ?Airway Management Planned: Natural Airway and Nasal Cannula ? ?Additional Equipment:  ? ?Intra-op Plan:  ? ?Post-operative Plan:  ? ?Informed Consent: I have reviewed the patients History and Physical, chart, labs and discussed the procedure including the risks, benefits and alternatives for the proposed anesthesia with the patient or authorized representative who has indicated his/her understanding and acceptance.  ? ? ? ?Dental Advisory Given ? ?Plan Discussed with: Anesthesiologist, CRNA and Surgeon ? ?Anesthesia Plan Comments: (Patient consented for risks  of anesthesia including but not limited to:  ?- adverse reactions to medications ?- risk of airway placement if required ?- damage to eyes, teeth, lips or other oral mucosa ?- nerve damage due to positioning  ?- sore throat or hoarseness ?- Damage to heart, brain, nerves, lungs, other parts of body or loss of life ? ?Patient voiced understanding.)  ? ? ? ? ? ? ?Anesthesia Quick Evaluation ? ?

## 2022-01-06 NOTE — Transfer of Care (Signed)
Immediate Anesthesia Transfer of Care Note ? ?Patient: Charlene Juarez ? ?Procedure(s) Performed: ESOPHAGOGASTRODUODENOSCOPY (EGD) WITH PROPOFOL ? ?Patient Location: PACU and Endoscopy Unit ? ?Anesthesia Type:General ? ?Level of Consciousness: drowsy and patient cooperative ? ?Airway & Oxygen Therapy: Patient Spontanous Breathing ? ?Post-op Assessment: Report given to RN and Post -op Vital signs reviewed and stable ? ?Post vital signs: Reviewed and stable ? ?Last Vitals:  ?Vitals Value Taken Time  ?BP 128/91 01/06/22 1050  ?Temp    ?Pulse 88 01/06/22 1051  ?Resp 13 01/06/22 1051  ?SpO2 98 % 01/06/22 1051  ?Vitals shown include unvalidated device data. ? ?Last Pain:  ?Vitals:  ? 01/06/22 1008  ?TempSrc: Temporal  ?PainSc: 0-No pain  ?   ? ?  ? ?Complications: No notable events documented. ?

## 2022-01-06 NOTE — H&P (Signed)
? ? ? ?Jonathon Bellows, MD ?[address] [place], [place], [place], [address] ?[address] [place], [place], [place], [address] ?Phone: [phone]  ?Fax: [phone] ? ?Primary Care Physician:  Venita Lick, NP ? ? ?Pre-Procedure History & Physical: ?HPI:  Sahvanna Mcmanigal is a 71 y.o. female is here for an endoscopy  ?  ?Past Medical History:  ?Diagnosis Date  ? Arthritis   ? Bursitis   ? Cancer [facility])   ? SKIN  ? Diabetes mellitus without complication ([place])   ? GERD (gastroesophageal reflux disease)   ? Hyperlipidemia   ? Hypertension   ? Sleep apnea   ? ? ?Past Surgical History:  ?Procedure Laterality Date  ? cancer removal off nose    ? CATARACT EXTRACTION W/PHACO Right 01/13/2017  ? Procedure: CATARACT EXTRACTION PHACO AND INTRAOCULAR LENS PLACEMENT (IOC);  Surgeon: Eulogio Bear, MD;  Location: [facility] ORS;  Service: Ophthalmology;  Laterality: Right;  [place] 31.1 ?AP% 0.0 ?CDE 1.78 ?Fluid Pack lot # [id number]  ? CATARACT EXTRACTION W/PHACO Left 02/24/2017  ? Procedure: CATARACT EXTRACTION PHACO AND INTRAOCULAR LENS PLACEMENT (IOC);  Surgeon: Eulogio Bear, MD;  Location: [facility] ORS;  Service: Ophthalmology;  Laterality: Left;  [place]  00:45.5 ?AP  0.7 ?CDE   2.76 ? fluid pack lot # [id number]  exp. 08-19-2018  ? DILATION AND CURETTAGE OF UTERUS    ? EYE SURGERY    ? rotator cuff surgery    ? TUBAL LIGATION    ? ? ?Prior to Admission medications   ?Medication Sig Start Date End Date Taking? Authorizing Provider  ?Ascorbic Acid (VITAMIN C) 1000 MG tablet Take 1,000 mg by mouth daily.   Yes [provider]  ?Cholecalciferol (VITAMIN D3) 1000 units CAPS Take 1,000 Units by mouth at bedtime.   Yes [provider]  ?empagliflozin (JARDIANCE) 25 MG TABS tablet Take 1 tablet (25 mg total) by mouth daily. 11/26/21  Yes Venita Lick, NP  ?Ginkgo Biloba 40 MG TABS Take 1 tablet by mouth daily.   Yes [provider]  ?glucose blood (BAYER CONTOUR TEST) test strip Contour test  strips ( or insurance preferred) used to check blood sugar BID. DX: E11.9 06/21/18  Yes Johnson, Megan P, DO  ?glucose blood test strip 1 each by Other route as needed for other. Use as instructed   Yes [provider]  ?hydrochlorothiazide (HYDRODIURIL) 25 MG tablet Take 1 tablet (25 mg total) by mouth daily. 02/12/21  Yes Venita Lick, NP  ?Magnesium 500 MG TABS Take 500 mg by mouth at bedtime.   Yes [provider]  ?metFORMIN (GLUCOPHAGE) 1000 MG tablet Take 1 tablet (1,000 mg total) by mouth 2 (two) times daily with a meal. 02/12/21  Yes Cannady, Barbaraann Faster, NP  ?Omega-3 Fatty Acids (FISH OIL) 1200 MG CAPS Take 1,200-2,400 mg by mouth 2 (two) times daily. 1 in the am and 2 at night   Yes [provider]  ?pantoprazole (PROTONIX) 20 MG tablet Take 1 tablet (20 mg total) by mouth 2 (two) times daily. 01/04/22  Yes Jonathon Bellows, MD  ?rosuvastatin (CRESTOR) 20 MG tablet Take 1 tablet (20 mg total) by mouth daily. 02/12/21  Yes Cannady, Jolene T, NP  ?telmisartan (MICARDIS) 80 MG tablet Take 1 tablet (80 mg total) by mouth daily. 02/12/21  Yes Cannady, Henrine Screws T, NP  ?tirzepatide (MOUNJARO) 5 MG/0.5ML Pen Inject 5 mg into the skin once a week. 12/22/21  Yes Venita Lick, NP  ?  albuterol (VENTOLIN HFA) 108 (90 Base) MCG/ACT inhaler TAKE 2 PUFFS BY MOUTH EVERY 6 HOURS AS NEEDED FOR WHEEZE OR SHORTNESS OF BREATH 11/26/21   Venita Lick, NP  ?Ivermectin 1 % CREA Apply topically daily. ?Patient not taking: Reported on 01/04/2022 05/22/20   [provider]  ?SYMBICORT 160-4.5 MCG/ACT inhaler TAKE 2 PUFFS BY MOUTH TWICE A DAY 11/19/20   Venita Lick, NP  ? ? ?Allergies as of 01/04/2022 - Review Complete 01/04/2022  ?Allergen Reaction Noted  ? Codeine sulfate Diarrhea and Nausea And Vomiting 04/08/2015  ? Lisinopril Cough 04/08/2015  ? ? ?Family History  ?Problem Relation Age of Onset  ? Alcohol abuse Mother   ? Heart disease Father   ? Alcohol abuse Father   ? Diabetes Sister   ?  Hyperlipidemia Sister   ? Diabetes Brother   ? Hypertension Brother   ? Thyroid disease Daughter   ? Diabetes Maternal Grandmother   ? Hypertension Sister   ? Aneurysm Sister   ? Diabetes Sister   ? Diabetes Sister   ? Breast cancer Maternal Aunt   ? ? ?Social History  ? ?Socioeconomic History  ? Marital status: Married  ?  Spouse name: Not on file  ? Number of children: 4  ? Years of education: Not on file  ? Highest education level: Associate degree: occupational, Hotel manager, or vocational program  ?Occupational History  ? Occupation: retired /disabled  ?Tobacco Use  ? Smoking status: Never  ? Smokeless tobacco: Never  ?Vaping Use  ? Vaping Use: Never used  ?Substance and Sexual Activity  ? Alcohol use: No  ?  Alcohol/week: 0.0 standard drinks  ? Drug use: Never  ? Sexual activity: Yes  ?Other Topics Concern  ? Not on file  ?Social History Narrative  ? Not on file  ? ?Social Determinants of Health  ? ?Financial Resource Strain: Low Risk   ? Difficulty of Paying Living Expenses: Not hard at all  ?Food Insecurity: No Food Insecurity  ? Worried About Charity fundraiser in the Last Year: Never true  ? Ran Out of Food in the Last Year: Never true  ?Transportation Needs: No Transportation Needs  ? Lack of Transportation (Medical): No  ? Lack of Transportation (Non-Medical): No  ?Physical Activity: Insufficiently Active  ? Days of Exercise per Week: 2 days  ? Minutes of Exercise per Session: 20 min  ?Stress: No Stress Concern Present  ? Feeling of Stress : Not at all  ?Social Connections: Moderately Integrated  ? Frequency of Communication with Friends and Family: More than three times a week  ? Frequency of Social Gatherings with Friends and Family: Three times a week  ? Attends Religious Services: 1 to 4 times per year  ? Active Member of Clubs or Organizations: No  ? Attends Archivist Meetings: Never  ? Marital Status: Married  ?Intimate Partner Violence: Not At Risk  ? Fear of Current or Ex-Partner: No  ?  Emotionally Abused: No  ? Physically Abused: No  ? Sexually Abused: No  ? ? ?Review of Systems: ?See HPI, otherwise negative ROS ? ?Physical Exam: ?BP 132/72   Pulse 76   Temp (!) 96.5 ?F (35.8 ?C) (Temporal)   Resp 16   Ht '5\' 2"'$  (1.575 m)   Wt 74.5 kg   LMP 07/13/2000 (Approximate)   SpO2 95%   BMI 30.06 kg/m?  ?General:   Alert,  pleasant and cooperative in NAD ?Head:  Normocephalic and  atraumatic. ?Neck:  Supple; no masses or thyromegaly. ?Lungs:  Clear throughout to auscultation, normal respiratory effort.    ?Heart:  +S1, +S2, Regular rate and rhythm, No edema. ?Abdomen:  Soft, nontender and nondistended. Normal bowel sounds, without guarding, and without rebound.   ?Neurologic:  Alert and  oriented x4;  grossly normal neurologically. ? ?Impression/Plan: ?Bushra Denman is here for an endoscopy  to be performed for  evaluation of dysphagia ?   ?Risks, benefits, limitations, and alternatives regarding endoscopy have been reviewed with the patient.  Questions have been answered.  All parties agreeable. ? ? ?Jonathon Bellows, MD  01/06/2022, 10:20 AM ?dysphagia ?

## 2022-01-06 NOTE — Anesthesia Procedure Notes (Signed)
Procedure Name: [place] ?Date/Time: 01/06/2022 10:35 AM ?Performed by: Jerrye Noble, CRNA ?Pre-anesthesia Checklist: Patient identified, Emergency Drugs available, Suction available and Patient being monitored ?Patient Re-evaluated:Patient Re-evaluated prior to induction ?Oxygen Delivery Method: Nasal cannula ? ? ? ? ?

## 2022-01-06 NOTE — Op Note (Signed)
[facility] ?Gastroenterology ?Patient Name: Charlene Juarez ?Procedure Date: 01/06/2022 10:24 AM ?MRN: [id number] ?Account #: [id number] ?Date of Birth: 01-Feb-1951 ?Admit Type: Outpatient ?Age: 71 ?Room: [facility] ENDO ROOM 3 ?Gender: Female ?Note Status: Finalized ?Instrument Name: Upper Endoscope [id number] ?Procedure:             Upper GI endoscopy ?Indications:           Dysphagia ?Providers:             Jonathon Bellows MD, MD ?Referring MD:          Barbaraann Faster. Ned Card (Referring MD) ?Medicines:             Monitored Anesthesia Care ?Complications:         No immediate complications. ?Procedure:             Pre-Anesthesia Assessment: ?                       - Prior to the procedure, a History and Physical was  ?                       performed, and patient medications, allergies and  ?                       sensitivities were reviewed. The patient's tolerance  ?                       of previous anesthesia was reviewed. ?                       - The risks and benefits of the procedure and the  ?                       sedation options and risks were discussed with the  ?                       patient. All questions were answered and informed  ?                       consent was obtained. ?                       - ASA Grade Assessment: II - A patient with mild  ?                       systemic disease. ?                       After obtaining informed consent, the endoscope was  ?                       passed under direct vision. Throughout the procedure,  ?                       the patient's blood pressure, pulse, and oxygen  ?                       saturations were monitored continuously. The Endoscope  ?                       was introduced through  the mouth, and advanced to the  ?                       third part of duodenum. The upper GI endoscopy was  ?                       accomplished with ease. The patient tolerated the  ?                       procedure well. ?Findings: ?     The examined esophagus  was normal. Biopsies were taken with a cold  ?     forceps for histology. ?     The examined duodenum was normal. ?     Patchy moderate inflammation characterized by congestion (edema),  ?     erythema and granularity was found on the greater curvature of the  ?     stomach and in the gastric antrum. Biopsies were taken with a cold  ?     forceps for histology. ?     The cardia and gastric fundus were normal on retroflexion. ?Impression:            - Normal esophagus. Biopsied. ?                       - Normal examined duodenum. ?                       - Gastritis. Biopsied. ?Recommendation:        - Await pathology results. ?                       - Discharge patient to home (with escort). ?                       - Resume previous diet. ?                       - Continue present medications. ?                       - Await pathology results. ?                       - Return to my office as previously scheduled. ?Procedure Code(s):     --- Professional --- ?                       [phone], Esophagogastroduodenoscopy, flexible,  ?                       transoral; with biopsy, single or multiple ?Diagnosis Code(s):     --- Professional --- ?                       K29.70, Gastritis, unspecified, without bleeding ?                       R13.10, Dysphagia, unspecified ?CPT copyright 2019 American Medical Association. All rights reserved. ?The codes documented in this report are preliminary and upon coder review may  ?be revised to meet current compliance requirements. ?Jonathon Bellows, MD ?Jonathon Bellows MD, MD ?01/06/2022 10:46:19 AM ?This report has been signed electronically. ?  Number of Addenda: 0 ?Note Initiated On: 01/06/2022 10:24 AM ?Estimated Blood Loss:  Estimated blood loss: none. ?     [facility] ?

## 2022-01-06 NOTE — Anesthesia Postprocedure Evaluation (Signed)
Anesthesia Post Note ? ?Patient: [name] ? ?Procedure(s) Performed: ESOPHAGOGASTRODUODENOSCOPY (EGD) WITH PROPOFOL ? ?Patient location during evaluation: Endoscopy ?Anesthesia Type: General ?Level of consciousness: awake and alert ?Pain management: pain level controlled ?Vital Signs Assessment: post-procedure vital signs reviewed and stable ?Respiratory status: spontaneous breathing, nonlabored ventilation, respiratory function stable and patient connected to nasal cannula oxygen ?Cardiovascular status: blood pressure returned to baseline and stable ?Postop Assessment: no apparent nausea or vomiting ?Anesthetic complications: no ? ? ?No notable events documented. ? ? ?Last Vitals:  ?Vitals:  ? 01/06/22 1008 01/06/22 1050  ?BP: 132/72 (!) 128/91  ?Pulse: 76 87  ?Resp: 16 13  ?Temp: (!) 35.8 ?C   ?SpO2: 95% 95%  ?  ?Last Pain:  ?Vitals:  ? 01/06/22 1008  ?TempSrc: Temporal  ?PainSc: 0-No pain  ? ? ?  ?  ?  ?  ?  ?  ? ?[name] [name] ? ? ? ? ?

## 2022-01-07 ENCOUNTER — Other Ambulatory Visit: Payer: Self-pay | Admitting: Gastroenterology

## 2022-01-07 ENCOUNTER — Encounter: Payer: Self-pay | Admitting: Nurse Practitioner

## 2022-01-08 ENCOUNTER — Encounter: Payer: Self-pay | Admitting: Gastroenterology

## 2022-01-08 LAB — SURGICAL PATHOLOGY

## 2022-01-11 ENCOUNTER — Other Ambulatory Visit: Payer: Self-pay

## 2022-01-11 MED ORDER — PANTOPRAZOLE SODIUM 20 MG PO TBEC: 20.0000 mg | DELAYED_RELEASE_TABLET | Freq: Two times a day (BID) | ORAL | 3 refills | Status: DC

## 2022-01-13 ENCOUNTER — Other Ambulatory Visit: Payer: Self-pay

## 2022-01-13 ENCOUNTER — Telehealth: Payer: Self-pay

## 2022-01-13 DIAGNOSIS — Z8619 Personal history of other infectious and parasitic diseases: Secondary | ICD-10-CM

## 2022-01-13 MED ORDER — CLARITHROMYCIN 500 MG PO TABS
500.0000 mg | ORAL_TABLET | Freq: Two times a day (BID) | ORAL | 0 refills | Status: AC
Start: 2022-01-13 — End: 2022-01-27

## 2022-01-13 MED ORDER — AMOXICILLIN 500 MG PO CAPS: 500.0000 mg | ORAL_CAPSULE | Freq: Two times a day (BID) | ORAL | 0 refills | Status: AC

## 2022-01-13 MED ORDER — OMEPRAZOLE 20 MG PO CPDR: 20.0000 mg | DELAYED_RELEASE_CAPSULE | Freq: Two times a day (BID) | ORAL | 0 refills | Status: DC

## 2022-01-13 MED ORDER — PANTOPRAZOLE SODIUM 20 MG PO TBEC: 20.0000 mg | DELAYED_RELEASE_TABLET | Freq: Two times a day (BID) | ORAL | 3 refills | Status: DC

## 2022-01-13 NOTE — Telephone Encounter (Signed)
Called patient to notify her of her lab results. However, I had to leave her a detailed message letting her know what she is needing to do. I told her that if she had any questions, to please call us back. I will also send her prescriptions to her pharmacy [facility] in [place]. I will also send her a message through [place]. ?

## 2022-01-13 NOTE — Telephone Encounter (Signed)
-----   Message from Jonathon Bellows, MD sent at 01/11/2022  9:46 AM EDT ----- ?Inform H pylori gastritis.  ? ?Suggest clarithromycin 500 mg PO BID, amoxicillin 1 gram BID omeprazole 20 mg BID all for 14 days.  , repeat H pylori stool antigen to check for eradication after 6 weeks of Rx.   ?

## 2022-01-17 ENCOUNTER — Other Ambulatory Visit: Payer: Self-pay | Admitting: Nurse Practitioner

## 2022-01-18 ENCOUNTER — Encounter: Payer: Self-pay | Admitting: Nurse Practitioner

## 2022-01-18 NOTE — Telephone Encounter (Signed)
Requested Prescriptions  ?Pending Prescriptions Disp Refills  ?? albuterol (VENTOLIN HFA) 108 (90 Base) MCG/ACT inhaler [Pharmacy Med Name: VENTOLIN HFA 90 MCG INHALER] 18 each 2  ?  Sig: TAKE 2 PUFFS BY MOUTH EVERY 6 HOURS AS NEEDED FOR WHEEZE OR SHORTNESS OF BREATH  ?  ? Pulmonology:  Beta Agonists 2 Passed - 01/17/2022  6:24 AM  ?  ?  Passed - Last BP in normal range  ?  BP Readings from Last 1 Encounters:  ?01/06/22 121/76  ?   ?  ?  Passed - Last Heart Rate in normal range  ?  Pulse Readings from Last 1 Encounters:  ?01/06/22 85  ?   ?  ?  Passed - Valid encounter within last 12 months  ?  Recent Outpatient Visits   ?      ? 3 weeks ago Type 2 diabetes mellitus with obesity ([place])  ? [place], [place] T, NP  ? 1 month ago Type 2 diabetes mellitus with obesity ([place])  ? [facility] [place], [place] T, NP  ? 2 months ago Gastroesophageal reflux disease without esophagitis  ? [place], [place]water T, NP  ? 4 months ago Type 2 diabetes mellitus with obesity ([place])  ? [place], [place] T, NP  ? 7 months ago Type 2 diabetes mellitus with obesity ([place])  ? [facility] [place], Henrine Screws T, NP  ?  ?  ?Future Appointments   ?        ? In 1 month Cannady, Barbaraann Faster, NP [facility]  ?  ? ?  ?  ?  ? ?

## 2022-02-20 ENCOUNTER — Other Ambulatory Visit: Payer: Self-pay | Admitting: Nurse Practitioner

## 2022-02-22 NOTE — Telephone Encounter (Signed)
Requested Prescriptions  Pending Prescriptions Disp Refills  . hydrochlorothiazide (HYDRODIURIL) 25 MG tablet [Pharmacy Med Name: HYDROCHLOROTHIAZIDE 25 MG TAB] 90 tablet 4    Sig: TAKE 1 TABLET (25 MG TOTAL) BY MOUTH DAILY.     Cardiovascular: Diuretics - Thiazide Passed - 02/20/2022  9:05 AM      Passed - Cr in normal range and within 180 days    Creatinine, Ser  Date Value Ref Range Status  11/23/2021 0.74 0.57 - 1.00 mg/dL Final         Passed - K in normal range and within 180 days    Potassium  Date Value Ref Range Status  11/23/2021 4.0 3.5 - 5.2 mmol/L Final         Passed - Na in normal range and within 180 days    Sodium  Date Value Ref Range Status  11/23/2021 137 134 - 144 mmol/L Final         Passed - Last BP in normal range    BP Readings from Last 1 Encounters:  01/06/22 121/76         Passed - Valid encounter within last 6 months    Recent Outpatient Visits          2 months ago Type 2 diabetes mellitus with obesity ([place])   [place], Jolene T, NP   3 months ago Type 2 diabetes mellitus with obesity ([place])   [place], [place] T, NP   4 months ago Gastroesophageal reflux disease without esophagitis   [facility] [place], [place] T, NP   6 months ago Type 2 diabetes mellitus with obesity ([place])   [place], Jolene T, NP   9 months ago Type 2 diabetes mellitus with obesity ([place])   [place], Barbaraann Faster, NP      Future Appointments            In 1 week Cannady, Barbaraann Faster, NP [facility]

## 2022-02-27 ENCOUNTER — Other Ambulatory Visit: Payer: Self-pay | Admitting: Gastroenterology

## 2022-02-27 ENCOUNTER — Other Ambulatory Visit: Payer: Self-pay | Admitting: Nurse Practitioner

## 2022-02-28 NOTE — Patient Instructions (Signed)

## 2022-03-01 NOTE — Telephone Encounter (Signed)
Last RF for rosuvastatin and telmisartan  02/12/21 #90 4 RF Last RF for metformin 02/12/21 #180 4 RF Requested Prescriptions  Refused Prescriptions Disp Refills  . rosuvastatin (CRESTOR) 20 MG tablet [Pharmacy Med Name: ROSUVASTATIN CALCIUM 20 MG TAB] 90 tablet 4    Sig: TAKE 1 TABLET BY MOUTH EVERY DAY     Cardiovascular:  Antilipid - Statins 2 Failed - 02/27/2022  7:41 AM      Failed - Lipid Panel in normal range within the last 12 months    Cholesterol, Total  Date Value Ref Range Status  11/23/2021 135 100 - 199 mg/dL Final   LDL Chol Calc (NIH)  Date Value Ref Range Status  11/23/2021 59 0 - 99 mg/dL Final   HDL  Date Value Ref Range Status  11/23/2021 53 >39 mg/dL Final   Triglycerides  Date Value Ref Range Status  11/23/2021 131 0 - 149 mg/dL Final         Passed - Cr in normal range and within 360 days    Creatinine, Ser  Date Value Ref Range Status  11/23/2021 0.74 0.57 - 1.00 mg/dL Final         Passed - Patient is not pregnant      Passed - Valid encounter within last 12 months    Recent Outpatient Visits          2 months ago Type 2 diabetes mellitus with obesity ([place])   [place], Jolene T, NP   3 months ago Type 2 diabetes mellitus with obesity ([place])   [place], [place] T, NP   4 months ago Gastroesophageal reflux disease without esophagitis   [facility] [place], Jolene T, NP   6 months ago Type 2 diabetes mellitus with obesity ([place])   [place], Jolene T, NP   9 months ago Type 2 diabetes mellitus with obesity ([place])   [place], [place] T, NP      Future Appointments            In 4 days Cannady, Barbaraann Faster, NP [facility]           . telmisartan (MICARDIS) 80 MG tablet [Pharmacy Med Name: TELMISARTAN 80 MG TABLET] 90 tablet 4    Sig: TAKE 1 TABLET BY MOUTH EVERY DAY     Cardiovascular:  Angiotensin Receptor Blockers Passed  - 02/27/2022  7:41 AM      Passed - Cr in normal range and within 180 days    Creatinine, Ser  Date Value Ref Range Status  11/23/2021 0.74 0.57 - 1.00 mg/dL Final         Passed - K in normal range and within 180 days    Potassium  Date Value Ref Range Status  11/23/2021 4.0 3.5 - 5.2 mmol/L Final         Passed - Patient is not pregnant      Passed - Last BP in normal range    BP Readings from Last 1 Encounters:  01/06/22 121/76         Passed - Valid encounter within last 6 months    Recent Outpatient Visits          2 months ago Type 2 diabetes mellitus with obesity ([place])   [place], Jolene T, NP   3 months ago Type 2 diabetes mellitus with obesity ([place])   [place], Barbaraann Faster, NP  4 months ago Gastroesophageal reflux disease without esophagitis   [facility] [place], [place] T, NP   6 months ago Type 2 diabetes mellitus with obesity ([place])   [place], Jolene T, NP   9 months ago Type 2 diabetes mellitus with obesity ([place])   [place], Barbaraann Faster, NP      Future Appointments            In 4 days Cannady, Barbaraann Faster, NP [facility]           . metFORMIN (GLUCOPHAGE) 1000 MG tablet [Pharmacy Med Name: METFORMIN HCL 1,000 MG TABLET] 180 tablet 4    Sig: TAKE 1 TABLET (1,000 MG TOTAL) BY MOUTH 2 (TWO) TIMES DAILY WITH A MEAL.     Endocrinology:  Diabetes - Biguanides Failed - 02/27/2022  7:41 AM      Failed - HBA1C is between 0 and 7.9 and within 180 days    HB A1C (BAYER DCA - WAIVED)  Date Value Ref Range Status  11/23/2021 8.2 (H) 4.8 - 5.6 % Final    Comment:             Prediabetes: 5.7 - 6.4          Diabetes: >6.4          Glycemic control for adults with diabetes: <7.0          Passed - Cr in normal range and within 360 days    Creatinine, Ser  Date Value Ref Range Status  11/23/2021 0.74 0.57 - 1.00 mg/dL Final         Passed -  eGFR in normal range and within 360 days    GFR calc Af Amer  Date Value Ref Range Status  05/08/2020 95 >59 mL/min/1.73 Final    Comment:    **[facility] currently reports eGFR in compliance with the current**   recommendations of the [employer]. [facility] will   update reporting as new guidelines are published from the NKF-ASN   Task force.    GFR calc non Af Amer  Date Value Ref Range Status  05/08/2020 82 >59 mL/min/1.73 Final   eGFR  Date Value Ref Range Status  11/23/2021 87 >59 mL/min/1.73 Final         Passed - B12 Level in normal range and within 720 days    Vitamin B-12  Date Value Ref Range Status  08/25/2021 603 232 - 1,245 pg/mL Final         Passed - Valid encounter within last 6 months    Recent Outpatient Visits          2 months ago Type 2 diabetes mellitus with obesity ([place])   [place], Jolene T, NP   3 months ago Type 2 diabetes mellitus with obesity ([place])   [place], [place] T, NP   4 months ago Gastroesophageal reflux disease without esophagitis   [facility] [place], [place] T, NP   6 months ago Type 2 diabetes mellitus with obesity ([place])   [place], Jolene T, NP   9 months ago Type 2 diabetes mellitus with obesity ([place])   [place], Barbaraann Faster, NP      Future Appointments            In 4 days Cannady, Barbaraann Faster, NP [facility] [place] -  CBC within normal limits and completed in the last 12 months    WBC  Date Value Ref Range Status  10/23/2021 7.6 3.4 - 10.8 x10E3/uL Final   RBC  Date Value Ref Range Status  10/23/2021 5.25 3.77 - 5.28 x10E6/uL Final   Hemoglobin  Date Value Ref Range Status  10/23/2021 15.1 11.1 - 15.9 g/dL Final   Hematocrit  Date Value Ref Range Status  10/23/2021 46.4 34.0 - 46.6 % Final   MCHC  Date Value Ref Range Status  10/23/2021 32.5 31.5 - 35.7 g/dL Final    [facility]  Date Value Ref Range Status  10/23/2021 28.8 26.6 - 33.0 pg Final   MCV  Date Value Ref Range Status  10/23/2021 88 79 - 97 fL Final   No results found for: "PLTCOUNTKUC", "LABPLAT", "POCPLA" RDW  Date Value Ref Range Status  10/23/2021 12.1 11.7 - 15.4 % Final

## 2022-03-05 ENCOUNTER — Ambulatory Visit (INDEPENDENT_AMBULATORY_CARE_PROVIDER_SITE_OTHER): Payer: Medicare Other | Admitting: Nurse Practitioner

## 2022-03-05 ENCOUNTER — Other Ambulatory Visit: Payer: Self-pay | Admitting: Nurse Practitioner

## 2022-03-05 ENCOUNTER — Encounter: Payer: Self-pay | Admitting: Nurse Practitioner

## 2022-03-05 VITALS — BP 112/67 | HR 72 | Temp 98.2°F | Ht 62.0 in | Wt 165.2 lb

## 2022-03-05 DIAGNOSIS — J4520 Mild intermittent asthma, uncomplicated: Secondary | ICD-10-CM | POA: Diagnosis not present

## 2022-03-05 DIAGNOSIS — E669 Obesity, unspecified: Secondary | ICD-10-CM | POA: Diagnosis not present

## 2022-03-05 DIAGNOSIS — Z6830 Body mass index (BMI) 30.0-30.9, adult: Secondary | ICD-10-CM | POA: Diagnosis not present

## 2022-03-05 DIAGNOSIS — E1159 Type 2 diabetes mellitus with other circulatory complications: Secondary | ICD-10-CM

## 2022-03-05 DIAGNOSIS — I152 Hypertension secondary to endocrine disorders: Secondary | ICD-10-CM

## 2022-03-05 DIAGNOSIS — E1169 Type 2 diabetes mellitus with other specified complication: Secondary | ICD-10-CM

## 2022-03-05 DIAGNOSIS — E785 Hyperlipidemia, unspecified: Secondary | ICD-10-CM | POA: Diagnosis not present

## 2022-03-05 DIAGNOSIS — E6609 Other obesity due to excess calories: Secondary | ICD-10-CM | POA: Diagnosis not present

## 2022-03-05 LAB — BAYER DCA HB A1C WAIVED: HB A1C (BAYER DCA - WAIVED): 8.9 % — ABNORMAL HIGH (ref 4.8–5.6)

## 2022-03-05 MED ORDER — GABAPENTIN 100 MG PO CAPS: 100.0000 mg | ORAL_CAPSULE | Freq: Three times a day (TID) | ORAL | 12 refills | Status: DC

## 2022-03-05 MED ORDER — MOUNJARO 7.5 MG/0.5ML ~~LOC~~ SOAJ
7.5000 mg | SUBCUTANEOUS | 4 refills | Status: DC
Start: 2022-03-05 — End: 2022-05-24

## 2022-03-05 MED ORDER — GLUCOSE BLOOD VI STRP
ORAL_STRIP | 12 refills | Status: DC
Start: 2022-03-05 — End: 2022-03-05

## 2022-03-05 NOTE — Telephone Encounter (Signed)
Requested medication (s) are due for refill today: see request   Requested medication (s) are on the active medication list: yes  Last refill:  03/05/22 #100 12 refills   Future visit scheduled: yes in 3 months  Notes to clinic:  Pharmacy comment: Alternative Requested:FOR MEDICARE, PAYS FOR 1 TEST DAILY IF NOT ON INSULIN     Requested Prescriptions  Pending Prescriptions Disp Refills   CONTOUR TEST test strip [Pharmacy Med Name: CONTOUR TEST STRIP] 100 strip 0    Sig: USE TO TEST BLOOD SUGAR ONCE A DAY     Endocrinology: Diabetes - Testing Supplies Passed - 03/05/2022  8:49 AM      Passed - Valid encounter within last 12 months    Recent Outpatient Visits           Today Type 2 diabetes mellitus with obesity ([place])   [place], Jolene T, NP   2 months ago Type 2 diabetes mellitus with obesity ([place])   [place], Jolene T, NP   3 months ago Type 2 diabetes mellitus with obesity ([place])   [place], [place] T, NP   4 months ago Gastroesophageal reflux disease without esophagitis   [facility] [place], Jolene T, NP   6 months ago Type 2 diabetes mellitus with obesity ([place])   [place], Barbaraann Faster, NP       Future Appointments             In 3 months Cannady, Barbaraann Faster, NP [facility]

## 2022-03-05 NOTE — Assessment & Plan Note (Signed)
Chronic, stable.  Recommend she use Symbicort daily to minimize Albuterol use + educate her on this.  Continue to monitor.

## 2022-03-05 NOTE — Progress Notes (Signed)
BP 112/67   Pulse 72   Temp 98.2 F (36.8 C) (Oral)   Ht '5\' 2"'$  (1.575 m)   Wt 165 lb 3.2 oz (74.9 kg)   LMP 07/13/2000 (Approximate)   SpO2 97%   BMI 30.22 kg/m    Subjective:    Patient ID: Charlene Juarez, female    DOB: 11-17-1950, 71 y.o.   MRN: [id number]  HPI: Charlene Juarez is a 71 y.o. female  Chief Complaint  Patient presents with   Diabetes    Patient states she was prescribed MOUNJARO at her last visit, she says she notice the medication isn't working for her as she thinks it should.    Hyperlipidemia   Hypertension   DIABETES A1c was 8.2% in February is taking care of grandchildren and eating more unhealthy food options -- taken off Ozempic and started on Mounjaro last visit = currently on 5 MG.  Currently continues Jardiance 25 MG and Metformin 1000 MG BID. Currently taking Gabapentin once a day for arm pain and sleep at night, has been on this for some time.   Hypoglycemic episodes:no Polydipsia/polyuria: no Visual disturbance: no Chest pain: no Paresthesias: no Glucose Monitoring: no             Accucheck frequency: daily             Fasting glucose: 140 to 200             Post prandial:             Evening:              Before meals: Taking Insulin?: no             Long acting insulin:             Short acting insulin: Blood Pressure Monitoring: yes Retinal Examination: Up To Date Foot Exam: Up to Date Pneumovax: Up to Date Influenza: Up to Date Aspirin: no   HYPERTENSION / HYPERLIPIDEMIA Continues on Telmisartan and HCTZ + Crestor. Satisfied with current treatment? yes Duration of hypertension: chronic BP monitoring frequency: a few times a week BP range: 110/70 range at home BP medication side effects: no Duration of hyperlipidemia: chronic Cholesterol medication side effects: no Cholesterol supplements: none Medication compliance: good compliance Aspirin: no Recent stressors: no Recurrent headaches: no Visual  changes: no Palpitations: no Dyspnea: no Chest pain: no Lower extremity edema: no Dizzy/lightheaded: no   ASTHMA Continues on Symbicort PRN (does not use daily) and Albuterol as needed. Asthma status: controlled Satisfied with current treatment?: yes Albuterol/rescue inhaler frequency: occasionally, more lately Dyspnea frequency: none Wheezing frequency: none Cough frequency: none Nocturnal symptom frequency: none Limitation of activity: no Current upper respiratory symptoms: no Triggers: pollen Home peak flows: none Last Spirometry: unknown Failed/intolerant to following asthma meds: none Asthma meds in past: as above Aerochamber/spacer use: no Visits to ER or Urgent Care in past year: no Pneumovax: Up to Date Influenza: Up to Date    Relevant past medical, surgical, family and social history reviewed and updated as indicated. Interim medical history since our last visit reviewed. Allergies and medications reviewed and updated.  Review of Systems  Constitutional:  Negative for activity change, appetite change, diaphoresis, fatigue and fever.  Respiratory:  Negative for cough, chest tightness, shortness of breath and wheezing.   Cardiovascular:  Negative for chest pain, palpitations and leg swelling.  Endocrine: Negative for polydipsia, polyphagia and polyuria.  Neurological: Negative.   Psychiatric/Behavioral: Negative.  Per HPI unless specifically indicated above     Objective:    BP 112/67   Pulse 72   Temp 98.2 F (36.8 C) (Oral)   Ht '5\' 2"'$  (1.575 m)   Wt 165 lb 3.2 oz (74.9 kg)   LMP 07/13/2000 (Approximate)   SpO2 97%   BMI 30.22 kg/m   Wt Readings from Last 3 Encounters:  03/05/22 165 lb 3.2 oz (74.9 kg)  01/06/22 164 lb 5.3 oz (74.5 kg)  01/04/22 167 lb 3.2 oz (75.8 kg)    Physical Exam Vitals and nursing note reviewed.  Constitutional:      General: She is awake. She is not in acute distress.    Appearance: She is well-developed and  well-groomed. She is obese. She is not ill-appearing or toxic-appearing.  HENT:     Head: Normocephalic.  Eyes:     General: Lids are normal. No scleral icterus.    Pupils: Pupils are equal, round, and reactive to light.  Neck:     Vascular: No carotid bruit.  Cardiovascular:     Rate and Rhythm: Normal rate and regular rhythm.     Pulses: Normal pulses.     Heart sounds: Normal heart sounds. No murmur heard.    No gallop.  Pulmonary:     Effort: Pulmonary effort is normal. No accessory muscle usage or respiratory distress.     Breath sounds: Normal breath sounds.  Abdominal:     General: Bowel sounds are normal. There is no distension.     Palpations: Abdomen is soft.  Musculoskeletal:     Cervical back: Full passive range of motion without pain.     Right lower leg: No edema.     Left lower leg: No edema.  Skin:    General: Skin is warm and dry.  Neurological:     Mental Status: She is alert.  Psychiatric:        Attention and Perception: Attention normal.        Mood and Affect: Mood normal.        Speech: Speech normal.        Behavior: Behavior is cooperative.        Thought Content: Thought content normal.    Results for orders placed or performed in visit on 01/29/22  HM DIABETES EYE EXAM  Result Value Ref Range   HM Diabetic Eye Exam No Retinopathy No Retinopathy      Assessment & Plan:   Problem List Items Addressed This Visit       Cardiovascular and Mediastinum   Hypertension associated with diabetes ([place])    Chronic, stable.  BP at goal in office and at home.  Continue current medication regimen, Telmisartan for kidney protection, and adjust as needed.  LABS: up to date.  Recommend she continue to check BP at home on occasion and document for visits. Focus on DASH diet.  Return in 3 months.      Relevant Medications   tirzepatide (MOUNJARO) 7.5 MG/0.5ML Pen   Other Relevant Orders   Bayer DCA Hb A1c Waived     Respiratory   Asthma, intermittent     Chronic, stable.  Recommend she use Symbicort daily to minimize Albuterol use + educate her on this.  Continue to monitor.        Endocrine   Hyperlipidemia associated with type 2 diabetes mellitus (HCC)    Chronic, ongoing.  Will continue Rosuvastatin daily and adjust dose as needed.  Return to office in  3 months.  Lipid panel up to date.      Relevant Medications   tirzepatide (MOUNJARO) 7.5 MG/0.5ML Pen   Other Relevant Orders   Bayer DCA Hb A1c Waived   Type 2 diabetes mellitus with obesity ([place]) - Primary    Chronic, ongoing.  Urine ALB 30 October 2021.  A1c recent visit 8.2% which is increased from previous 7.5% -- will recheck today, suspect it will still be elevated based on BS at home. Mounjaro, minimal reduction in sugars at this time, will increase to 7.5 MG weekly.  Continue Metformin and Jardiance at max doses.  Check blood sugar 3 times a day and document for provider.  Recommend eye exam.  Return in 3 months.      Relevant Medications   tirzepatide (MOUNJARO) 7.5 MG/0.5ML Pen   Other Relevant Orders   Bayer DCA Hb A1c Waived     Other   Obesity    BMI 30.22.  Recommended eating smaller high protein, low fat meals more frequently and exercising 30 mins a day 5 times a week with a goal of 10-15lb weight loss in the next 3 months. Patient voiced their understanding and motivation to adhere to these recommendations.       Relevant Medications   tirzepatide (MOUNJARO) 7.5 MG/0.5ML Pen     Follow up plan: Return in about 3 months (around 06/05/2022) for T2DM, HTN/HLD, GERD, ASTHMA, OSTEOPENIA.

## 2022-03-05 NOTE — Telephone Encounter (Signed)
Requested medication (s) are due for refill today:   Prescribed today  Requested medication (s) are on the active medication list:   Yes  Future visit scheduled:   Pharmacy needing an alternative:  Diagnosis Code on Rx.   Last ordered: 03/05/2022 #100, 0 refills  See pharmacy note   Requested Prescriptions  Pending Prescriptions Disp Refills   CONTOUR TEST test strip [Pharmacy Med Name: CONTOUR TEST STRIP] 100 strip 0    Sig: USE TO TEST BLOOD SUGAR ONCE A DAY     Endocrinology: Diabetes - Testing Supplies Passed - 03/05/2022  1:51 PM      Passed - Valid encounter within last 12 months    Recent Outpatient Visits           Today Type 2 diabetes mellitus with obesity ([place])   [place], Jolene T, NP   2 months ago Type 2 diabetes mellitus with obesity ([place])   [place], Jolene T, NP   3 months ago Type 2 diabetes mellitus with obesity ([place])   [place]ride, [place] T, NP   4 months ago Gastroesophageal reflux disease without esophagitis   [facility] [place], Jolene T, NP   6 months ago Type 2 diabetes mellitus with obesity ([place])   [place], Barbaraann Faster, NP       Future Appointments             In 3 months Cannady, Barbaraann Faster, NP [facility]            CF

## 2022-03-05 NOTE — Assessment & Plan Note (Signed)
Chronic, stable.  BP at goal in office and at home.  Continue current medication regimen, Telmisartan for kidney protection, and adjust as needed.  LABS: up to date.  Recommend she continue to check BP at home on occasion and document for visits. Focus on DASH diet.  Return in 3 months.

## 2022-03-05 NOTE — Assessment & Plan Note (Signed)
Chronic, ongoing.  Urine ALB 30 October 2021.  A1c recent visit 8.2% which is increased from previous 7.5% -- will recheck today, suspect it will still be elevated based on BS at home. Mounjaro, minimal reduction in sugars at this time, will increase to 7.5 MG weekly.  Continue Metformin and Jardiance at max doses.  Check blood sugar 3 times a day and document for provider.  Recommend eye exam.  Return in 3 months.

## 2022-03-05 NOTE — Assessment & Plan Note (Signed)
Chronic, ongoing.  Will continue Rosuvastatin daily and adjust dose as needed.  Return to office in 3 months.  Lipid panel up to date.

## 2022-03-05 NOTE — Assessment & Plan Note (Signed)
BMI 30.22.  Recommended eating smaller high protein, low fat meals more frequently and exercising 30 mins a day 5 times a week with a goal of 10-15lb weight loss in the next 3 months. Patient voiced their understanding and motivation to adhere to these recommendations.

## 2022-03-05 NOTE — Progress Notes (Signed)
Contacted via [place] evening Risa, your A1c has returned and it is trending up more at 8.9%.  Start taking the increase Mounjaro dose as I ordered.  If in 4 weeks your fasting blood sugar in morning is still greater then 130 and sugars 2 hour after meals are greater then 180, let me know and we will go up more on Mounjaro.  Any questions?  Cut back on the sweets and carbohydrates. Keep being amazing!!  Thank you for allowing me to participate in your care.  I appreciate you. Kindest regards, Zaydan Papesh

## 2022-03-08 ENCOUNTER — Telehealth: Payer: Self-pay

## 2022-03-08 DIAGNOSIS — H43813 Vitreous degeneration, bilateral: Secondary | ICD-10-CM | POA: Diagnosis not present

## 2022-03-09 MED ORDER — CONTOUR TEST VI STRP: ORAL_STRIP | 4 refills | Status: DC

## 2022-03-09 NOTE — Addendum Note (Signed)
Addended by: Marnee Guarneri T on: 03/09/2022 10:10 AM   Modules accepted: Orders

## 2022-03-09 NOTE — Telephone Encounter (Signed)
Pharmacy sent over fax and stated they needed a an ICD-10 code attached to the prescription for the testing strips. Please advise?

## 2022-05-22 ENCOUNTER — Inpatient Hospital Stay (HOSPITAL_BASED_OUTPATIENT_CLINIC_OR_DEPARTMENT_OTHER)
Admission: EM | Admit: 2022-05-22 | Discharge: 2022-05-24 | DRG: 440 | Disposition: A | Discharge status: Home / Self Care | Payer: Medicare Other | Attending: Internal Medicine | Admitting: Internal Medicine

## 2022-05-22 ENCOUNTER — Other Ambulatory Visit: Payer: Self-pay

## 2022-05-22 ENCOUNTER — Emergency Department (HOSPITAL_BASED_OUTPATIENT_CLINIC_OR_DEPARTMENT_OTHER): Payer: Medicare Other

## 2022-05-22 ENCOUNTER — Encounter (HOSPITAL_BASED_OUTPATIENT_CLINIC_OR_DEPARTMENT_OTHER): Payer: Self-pay | Admitting: Emergency Medicine

## 2022-05-22 DIAGNOSIS — E1159 Type 2 diabetes mellitus with other circulatory complications: Secondary | ICD-10-CM | POA: Diagnosis present

## 2022-05-22 DIAGNOSIS — Z8719 Personal history of other diseases of the digestive system: Secondary | ICD-10-CM | POA: Diagnosis not present

## 2022-05-22 DIAGNOSIS — E669 Obesity, unspecified: Secondary | ICD-10-CM | POA: Diagnosis present

## 2022-05-22 DIAGNOSIS — Z7984 Long term (current) use of oral hypoglycemic drugs: Secondary | ICD-10-CM

## 2022-05-22 DIAGNOSIS — Z833 Family history of diabetes mellitus: Secondary | ICD-10-CM | POA: Diagnosis not present

## 2022-05-22 DIAGNOSIS — K859 Acute pancreatitis without necrosis or infection, unspecified: Secondary | ICD-10-CM | POA: Diagnosis not present

## 2022-05-22 DIAGNOSIS — R1011 Right upper quadrant pain: Secondary | ICD-10-CM

## 2022-05-22 DIAGNOSIS — I152 Hypertension secondary to endocrine disorders: Secondary | ICD-10-CM | POA: Diagnosis present

## 2022-05-22 DIAGNOSIS — G4733 Obstructive sleep apnea (adult) (pediatric): Secondary | ICD-10-CM | POA: Diagnosis not present

## 2022-05-22 DIAGNOSIS — R109 Unspecified abdominal pain: Secondary | ICD-10-CM | POA: Diagnosis not present

## 2022-05-22 DIAGNOSIS — I700 Atherosclerosis of aorta: Secondary | ICD-10-CM | POA: Diagnosis not present

## 2022-05-22 DIAGNOSIS — Z83438 Family history of other disorder of lipoprotein metabolism and other lipidemia: Secondary | ICD-10-CM

## 2022-05-22 DIAGNOSIS — E785 Hyperlipidemia, unspecified: Secondary | ICD-10-CM | POA: Diagnosis not present

## 2022-05-22 DIAGNOSIS — Z8619 Personal history of other infectious and parasitic diseases: Secondary | ICD-10-CM | POA: Diagnosis not present

## 2022-05-22 DIAGNOSIS — E663 Overweight: Secondary | ICD-10-CM | POA: Diagnosis present

## 2022-05-22 DIAGNOSIS — Z8249 Family history of ischemic heart disease and other diseases of the circulatory system: Secondary | ICD-10-CM

## 2022-05-22 DIAGNOSIS — E1165 Type 2 diabetes mellitus with hyperglycemia: Secondary | ICD-10-CM | POA: Diagnosis not present

## 2022-05-22 DIAGNOSIS — R933 Abnormal findings on diagnostic imaging of other parts of digestive tract: Secondary | ICD-10-CM | POA: Diagnosis not present

## 2022-05-22 DIAGNOSIS — Z885 Allergy status to narcotic agent status: Secondary | ICD-10-CM

## 2022-05-22 DIAGNOSIS — K219 Gastro-esophageal reflux disease without esophagitis: Secondary | ICD-10-CM | POA: Diagnosis present

## 2022-05-22 DIAGNOSIS — R7989 Other specified abnormal findings of blood chemistry: Secondary | ICD-10-CM | POA: Diagnosis not present

## 2022-05-22 DIAGNOSIS — Z888 Allergy status to other drugs, medicaments and biological substances status: Secondary | ICD-10-CM | POA: Diagnosis not present

## 2022-05-22 DIAGNOSIS — E1169 Type 2 diabetes mellitus with other specified complication: Secondary | ICD-10-CM | POA: Diagnosis present

## 2022-05-22 DIAGNOSIS — Z803 Family history of malignant neoplasm of breast: Secondary | ICD-10-CM

## 2022-05-22 DIAGNOSIS — R1013 Epigastric pain: Secondary | ICD-10-CM | POA: Diagnosis not present

## 2022-05-22 DIAGNOSIS — Z6829 Body mass index (BMI) 29.0-29.9, adult: Secondary | ICD-10-CM | POA: Diagnosis not present

## 2022-05-22 DIAGNOSIS — R1084 Generalized abdominal pain: Secondary | ICD-10-CM | POA: Diagnosis not present

## 2022-05-22 DIAGNOSIS — K760 Fatty (change of) liver, not elsewhere classified: Secondary | ICD-10-CM | POA: Diagnosis not present

## 2022-05-22 DIAGNOSIS — I10 Essential (primary) hypertension: Secondary | ICD-10-CM | POA: Diagnosis not present

## 2022-05-22 LAB — COMPREHENSIVE METABOLIC PANEL
ALT: 99 U/L — ABNORMAL HIGH (ref 0–44)
AST: 108 U/L — ABNORMAL HIGH (ref 15–41)
Albumin: 4.3 g/dL (ref 3.5–5.0)
Alkaline Phosphatase: 51 U/L (ref 38–126)
Anion gap: 11 (ref 5–15)
BUN: 21 mg/dL (ref 8–23)
CO2: 28 mmol/L (ref 22–32)
Calcium: 8.9 mg/dL (ref 8.9–10.3)
Chloride: 98 mmol/L (ref 98–111)
Creatinine, Ser: 0.8 mg/dL (ref 0.44–1.00)
GFR, Estimated: >60 mL/min (ref >60)
Glucose, Bld: 146 mg/dL — ABNORMAL HIGH (ref 70–99)
Potassium: 3.8 mmol/L (ref 3.5–5.1)
Sodium: 137 mmol/L (ref 135–145)
Total Bilirubin: 0.8 mg/dL (ref 0.3–1.2)
Total Protein: 6.5 g/dL (ref 6.5–8.1)

## 2022-05-22 LAB — CBC WITH DIFFERENTIAL/PLATELET
Abs Immature Granulocytes: 0.03 10*3/uL (ref 0.00–0.07)
Basophils Absolute: 0 10*3/uL (ref 0.0–0.1)
Basophils Relative: 0 %
Eosinophils Absolute: 0.1 10*3/uL (ref 0.0–0.5)
Eosinophils Relative: 1 %
HCT: 40.6 % (ref 36.0–46.0)
Hemoglobin: 13.6 g/dL (ref 12.0–15.0)
Immature Granulocytes: 0 %
Lymphocytes Relative: 16 %
Lymphs Abs: 1.8 10*3/uL (ref 0.7–4.0)
MCH: 29.3 pg (ref 26.0–34.0)
MCHC: 33.5 g/dL (ref 30.0–36.0)
MCV: 87.5 fL (ref 80.0–100.0)
Monocytes Absolute: 0.6 10*3/uL (ref 0.1–1.0)
Monocytes Relative: 5 %
Neutro Abs: 9 10*3/uL — ABNORMAL HIGH (ref 1.7–7.7)
Neutrophils Relative %: 78 %
Platelets: 197 10*3/uL (ref 150–400)
RBC: 4.64 MIL/uL (ref 3.87–5.11)
RDW: 13 % (ref 11.5–15.5)
WBC: 11.6 10*3/uL — ABNORMAL HIGH (ref 4.0–10.5)
nRBC: 0 % (ref 0.0–0.2)

## 2022-05-22 LAB — LIPASE, BLOOD: Lipase: 117 U/L — ABNORMAL HIGH (ref 11–51)

## 2022-05-22 MED ORDER — ONDANSETRON HCL 4 MG/2ML IJ SOLN
4.0000 mg | Freq: Once | INTRAMUSCULAR | Status: AC
  Administered 2022-05-22: 4 mg via INTRAVENOUS
  Filled 2022-05-22: qty 2

## 2022-05-22 MED ORDER — IOHEXOL 300 MG/ML  SOLN
100.0000 mL | Freq: Once | INTRAMUSCULAR | Status: AC | PRN
  Administered 2022-05-22: 100 mL via INTRAVENOUS

## 2022-05-22 MED ORDER — FENTANYL CITRATE PF 50 MCG/ML IJ SOSY
50.0000 ug | PREFILLED_SYRINGE | Freq: Once | INTRAMUSCULAR | Status: AC
  Administered 2022-05-22: 50 ug via INTRAVENOUS
  Filled 2022-05-22: qty 1

## 2022-05-22 MED ORDER — SODIUM CHLORIDE 0.9 % IV BOLUS
1000.0000 mL | Freq: Once | INTRAVENOUS | Status: AC
  Administered 2022-05-22: 1000 mL via INTRAVENOUS

## 2022-05-22 NOTE — ED Provider Notes (Signed)
[facility] Provider Note  CSN: [id number] Arrival date & time: 05/22/22 2201  Chief Complaint(s) Abdominal Pain  HPI Charlene Juarez is a 71 y.o. female {Add pertinent medical, surgical, social history, OB history to HPI:1}    Abdominal Pain Pain location:  Epigastric Pain quality: aching and gnawing   Pain radiates to:  Back Pain severity:  Moderate Onset quality:  Gradual Duration:  20 hours Timing:  Constant Progression:  Worsening Chronicity:  New Context: not alcohol use   Relieved by:  Nothing Worsened by:  Eating Associated symptoms: nausea   Associated symptoms: no constipation, no diarrhea, no dysuria, no fatigue, no flatus and no vomiting     Past Medical History Past Medical History:  Diagnosis Date   Arthritis    Bursitis    Cancer ([place])    SKIN   Diabetes mellitus without complication ([place])    GERD (gastroesophageal reflux disease)    Hyperlipidemia    Hypertension    Sleep apnea    Patient Active Problem List   Diagnosis Date Noted   Osteopenia of neck of right femur 10/31/2019   B12 deficiency 01/18/2019   Vitamin D deficiency 10/19/2018   Asthma, intermittent 06/11/2015   Type 2 diabetes mellitus with obesity ([place]) 04/08/2015   Hypertension associated with diabetes ([place]) 04/08/2015   Hyperlipidemia associated with type 2 diabetes mellitus ([place]) 04/08/2015   GERD (gastroesophageal reflux disease) 04/08/2015   Obesity 04/08/2015   Allergic rhinitis 04/08/2015   Rosacea 04/08/2015   Home Medication(s) Prior to Admission medications   Medication Sig Start Date End Date Taking? Authorizing Provider  albuterol (VENTOLIN HFA) 108 (90 Base) MCG/ACT inhaler TAKE 2 PUFFS BY MOUTH EVERY 6 HOURS AS NEEDED FOR WHEEZE OR SHORTNESS OF BREATH 01/18/22   Cannady, Henrine Screws T, NP  Ascorbic Acid (VITAMIN C) 1000 MG tablet Take 1,000 mg by mouth daily.    [provider]  Cholecalciferol (VITAMIN D3) 1000 units CAPS  Take 1,000 Units by mouth at bedtime.    [provider]  empagliflozin (JARDIANCE) 25 MG TABS tablet Take 1 tablet (25 mg total) by mouth daily. 11/26/21   Cannady, Henrine Screws T, NP  gabapentin (NEURONTIN) 100 MG capsule Take 1 capsule (100 mg total) by mouth 3 (three) times daily. 03/05/22   Cannady, Henrine Screws T, NP  Ginkgo Biloba 40 MG TABS Take 1 tablet by mouth daily.    [provider]  glucose blood (CONTOUR TEST) test strip USE TO TEST BLOOD SUGAR ONCE A DAY == DX E11.69 03/09/22   Cannady, Jolene T, NP  hydrochlorothiazide (HYDRODIURIL) 25 MG tablet TAKE 1 TABLET (25 MG TOTAL) BY MOUTH DAILY. 02/22/22   Cannady, Henrine Screws T, NP  Magnesium 500 MG TABS Take 500 mg by mouth at bedtime.    [provider]  metFORMIN (GLUCOPHAGE) 1000 MG tablet Take 1 tablet (1,000 mg total) by mouth 2 (two) times daily with a meal. 02/12/21   Cannady, Jolene T, NP  Omega-3 Fatty Acids (FISH OIL) 1200 MG CAPS Take 1,200-2,400 mg by mouth 2 (two) times daily. 1 in the am and 2 at night    [provider]  pantoprazole (PROTONIX) 20 MG tablet Take 1 tablet (20 mg total) by mouth 2 (two) times daily. 01/13/22   Jonathon Bellows, MD  rosuvastatin (CRESTOR) 20 MG tablet Take 1 tablet (20 mg total) by mouth daily. 02/12/21   Cannady, Barbaraann Faster, NP  SYMBICORT 160-4.5 MCG/ACT inhaler TAKE 2 PUFFS BY MOUTH TWICE A DAY  11/19/20   Cannady, Henrine Screws T, NP  telmisartan (MICARDIS) 80 MG tablet Take 1 tablet (80 mg total) by mouth daily. 02/12/21   Cannady, Henrine Screws T, NP  tirzepatide (MOUNJARO) 7.5 MG/0.5ML Pen Inject 7.5 mg into the skin once a week. 03/05/22   Marnee Guarneri T, NP                                                                                                                                    Allergies Codeine sulfate and Lisinopril  Review of Systems Review of Systems  Constitutional:  Negative for fatigue.  Gastrointestinal:  Positive for abdominal pain and nausea. Negative for constipation,  diarrhea, flatus and vomiting.  Genitourinary:  Negative for dysuria.   As noted in HPI  Physical Exam Vital Signs  I have reviewed the triage vital signs BP (!) 136/59   Pulse (!) 102   Temp 99.5 F (37.5 C) (Oral)   Resp 18   LMP 07/13/2000 (Approximate)   SpO2 94%  *** Physical Exam  ED Results and Treatments Labs (all labs ordered are listed, but only abnormal results are displayed) Labs Reviewed  COMPREHENSIVE METABOLIC PANEL - Abnormal; Notable for the following components:      Result Value   Glucose, Bld 146 (*)    AST 108 (*)    ALT 99 (*)    All other components within normal limits  LIPASE, BLOOD - Abnormal; Notable for the following components:   Lipase 117 (*)    All other components within normal limits  CBC WITH DIFFERENTIAL/PLATELET - Abnormal; Notable for the following components:   WBC 11.6 (*)    Neutro Abs 9.0 (*)    All other components within normal limits                                                                                                                         EKG  EKG Interpretation  Date/Time:    Ventricular Rate:    PR Interval:    QRS Duration:   QT Interval:    QTC Calculation:   R Axis:     Text Interpretation:         Radiology No results found.  Medications Ordered in ED Medications - No data to display  Procedures Procedures  (including critical care time)  Medical Decision Making / ED Course   Medical Decision Making Amount and/or Complexity of Data Reviewed Labs: ordered. Decision-making details documented in ED Course. Radiology: ordered and independent interpretation performed. Decision-making details documented in ED Course.          Final Clinical Impression(s) / ED Diagnoses Final diagnoses:  None    {Document critical care time when appropriate:1}  {Document  review of labs and clinical decision tools ie heart score, Chads2Vasc2 etc:1}  {Document your independent review of radiology images, and any outside records:1} {Document your discussion with family members, caretakers, and with consultants:1} {Document social determinants of health affecting pt's care:1} {Document your decision making why or why not admission, treatments were needed:1} This chart was dictated using voice recognition software.  Despite best efforts to proofread,  errors can occur which can change the documentation meaning.

## 2022-05-22 NOTE — ED Triage Notes (Signed)
Upper abdominal pains . Started around 3AM. Reports "feeling gassy" and not being able to have a BM today.

## 2022-05-23 ENCOUNTER — Encounter (HOSPITAL_COMMUNITY): Payer: Self-pay

## 2022-05-23 ENCOUNTER — Observation Stay (HOSPITAL_COMMUNITY): Payer: Medicare Other

## 2022-05-23 DIAGNOSIS — K219 Gastro-esophageal reflux disease without esophagitis: Secondary | ICD-10-CM

## 2022-05-23 DIAGNOSIS — K859 Acute pancreatitis without necrosis or infection, unspecified: Secondary | ICD-10-CM

## 2022-05-23 DIAGNOSIS — G4733 Obstructive sleep apnea (adult) (pediatric): Secondary | ICD-10-CM | POA: Diagnosis present

## 2022-05-23 DIAGNOSIS — Z888 Allergy status to other drugs, medicaments and biological substances status: Secondary | ICD-10-CM | POA: Diagnosis not present

## 2022-05-23 DIAGNOSIS — E669 Obesity, unspecified: Secondary | ICD-10-CM

## 2022-05-23 DIAGNOSIS — E1169 Type 2 diabetes mellitus with other specified complication: Secondary | ICD-10-CM

## 2022-05-23 DIAGNOSIS — I10 Essential (primary) hypertension: Secondary | ICD-10-CM | POA: Diagnosis not present

## 2022-05-23 DIAGNOSIS — E1159 Type 2 diabetes mellitus with other circulatory complications: Secondary | ICD-10-CM

## 2022-05-23 DIAGNOSIS — K760 Fatty (change of) liver, not elsewhere classified: Secondary | ICD-10-CM | POA: Diagnosis present

## 2022-05-23 DIAGNOSIS — Z8719 Personal history of other diseases of the digestive system: Secondary | ICD-10-CM | POA: Diagnosis not present

## 2022-05-23 DIAGNOSIS — Z803 Family history of malignant neoplasm of breast: Secondary | ICD-10-CM | POA: Diagnosis not present

## 2022-05-23 DIAGNOSIS — I152 Hypertension secondary to endocrine disorders: Secondary | ICD-10-CM | POA: Diagnosis present

## 2022-05-23 DIAGNOSIS — R109 Unspecified abdominal pain: Secondary | ICD-10-CM | POA: Diagnosis present

## 2022-05-23 DIAGNOSIS — Z833 Family history of diabetes mellitus: Secondary | ICD-10-CM | POA: Diagnosis not present

## 2022-05-23 DIAGNOSIS — E785 Hyperlipidemia, unspecified: Secondary | ICD-10-CM

## 2022-05-23 DIAGNOSIS — E1165 Type 2 diabetes mellitus with hyperglycemia: Secondary | ICD-10-CM | POA: Diagnosis present

## 2022-05-23 DIAGNOSIS — Z83438 Family history of other disorder of lipoprotein metabolism and other lipidemia: Secondary | ICD-10-CM | POA: Diagnosis not present

## 2022-05-23 DIAGNOSIS — Z885 Allergy status to narcotic agent status: Secondary | ICD-10-CM | POA: Diagnosis not present

## 2022-05-23 DIAGNOSIS — R1011 Right upper quadrant pain: Secondary | ICD-10-CM | POA: Diagnosis present

## 2022-05-23 DIAGNOSIS — R7989 Other specified abnormal findings of blood chemistry: Secondary | ICD-10-CM

## 2022-05-23 DIAGNOSIS — E663 Overweight: Secondary | ICD-10-CM | POA: Diagnosis present

## 2022-05-23 DIAGNOSIS — Z8249 Family history of ischemic heart disease and other diseases of the circulatory system: Secondary | ICD-10-CM | POA: Diagnosis not present

## 2022-05-23 DIAGNOSIS — R933 Abnormal findings on diagnostic imaging of other parts of digestive tract: Secondary | ICD-10-CM | POA: Diagnosis not present

## 2022-05-23 DIAGNOSIS — Z8619 Personal history of other infectious and parasitic diseases: Secondary | ICD-10-CM | POA: Diagnosis not present

## 2022-05-23 DIAGNOSIS — R1013 Epigastric pain: Secondary | ICD-10-CM

## 2022-05-23 DIAGNOSIS — Z7984 Long term (current) use of oral hypoglycemic drugs: Secondary | ICD-10-CM | POA: Diagnosis not present

## 2022-05-23 DIAGNOSIS — Z6829 Body mass index (BMI) 29.0-29.9, adult: Secondary | ICD-10-CM | POA: Diagnosis not present

## 2022-05-23 DIAGNOSIS — R1084 Generalized abdominal pain: Secondary | ICD-10-CM | POA: Diagnosis not present

## 2022-05-23 LAB — CBC WITH DIFFERENTIAL/PLATELET
Abs Immature Granulocytes: 0.03 10*3/uL (ref 0.00–0.07)
Basophils Absolute: 0 10*3/uL (ref 0.0–0.1)
Basophils Relative: 0 %
Eosinophils Absolute: 0 10*3/uL (ref 0.0–0.5)
Eosinophils Relative: 0 %
HCT: 39.4 % (ref 36.0–46.0)
Hemoglobin: 13.2 g/dL (ref 12.0–15.0)
Immature Granulocytes: 0 %
Lymphocytes Relative: 15 %
Lymphs Abs: 1.4 10*3/uL (ref 0.7–4.0)
MCH: 29.7 pg (ref 26.0–34.0)
MCHC: 33.5 g/dL (ref 30.0–36.0)
MCV: 88.5 fL (ref 80.0–100.0)
Monocytes Absolute: 0.7 10*3/uL (ref 0.1–1.0)
Monocytes Relative: 8 %
Neutro Abs: 6.9 10*3/uL (ref 1.7–7.7)
Neutrophils Relative %: 77 %
Platelets: 176 10*3/uL (ref 150–400)
RBC: 4.45 MIL/uL (ref 3.87–5.11)
RDW: 13 % (ref 11.5–15.5)
WBC: 9 10*3/uL (ref 4.0–10.5)
nRBC: 0 % (ref 0.0–0.2)

## 2022-05-23 LAB — COMPREHENSIVE METABOLIC PANEL
ALT: 165 U/L — ABNORMAL HIGH (ref 0–44)
AST: 135 U/L — ABNORMAL HIGH (ref 15–41)
Albumin: 3.4 g/dL — ABNORMAL LOW (ref 3.5–5.0)
Alkaline Phosphatase: 57 U/L (ref 38–126)
Anion gap: 7 (ref 5–15)
BUN: 14 mg/dL (ref 8–23)
CO2: 25 mmol/L (ref 22–32)
Calcium: 8.6 mg/dL — ABNORMAL LOW (ref 8.9–10.3)
Chloride: 104 mmol/L (ref 98–111)
Creatinine, Ser: 0.76 mg/dL (ref 0.44–1.00)
GFR, Estimated: >60 mL/min (ref >60)
Glucose, Bld: 142 mg/dL — ABNORMAL HIGH (ref 70–99)
Potassium: 3.8 mmol/L (ref 3.5–5.1)
Sodium: 136 mmol/L (ref 135–145)
Total Bilirubin: 0.8 mg/dL (ref 0.3–1.2)
Total Protein: 5.8 g/dL — ABNORMAL LOW (ref 6.5–8.1)

## 2022-05-23 LAB — GLUCOSE, CAPILLARY
Glucose-Capillary: 152 mg/dL — ABNORMAL HIGH (ref 70–99)
Glucose-Capillary: 115 mg/dL — ABNORMAL HIGH (ref 70–99)
Glucose-Capillary: 242 mg/dL — ABNORMAL HIGH (ref 70–99)
Glucose-Capillary: 150 mg/dL — ABNORMAL HIGH (ref 70–99)

## 2022-05-23 LAB — LIPASE, BLOOD: Lipase: 61 U/L — ABNORMAL HIGH (ref 11–51)

## 2022-05-23 MED ORDER — PIPERACILLIN-TAZOBACTAM 3.375 G IVPB
3.3750 g | Freq: Three times a day (TID) | INTRAVENOUS | Status: DC
  Administered 2022-05-23 – 2022-05-24 (×4): 3.375 g via INTRAVENOUS
  Filled 2022-05-23 (×5): qty 50

## 2022-05-23 MED ORDER — INSULIN ASPART 100 UNIT/ML IJ SOLN
0.0000 [IU] | Freq: Three times a day (TID) | INTRAMUSCULAR | Status: DC
  Administered 2022-05-23: 2 [IU] via SUBCUTANEOUS
  Administered 2022-05-24: 4 [IU] via SUBCUTANEOUS
  Administered 2022-05-24: 1 [IU] via SUBCUTANEOUS

## 2022-05-23 MED ORDER — GABAPENTIN 100 MG PO CAPS
100.0000 mg | ORAL_CAPSULE | Freq: Three times a day (TID) | ORAL | Status: DC
  Administered 2022-05-23 – 2022-05-24 (×5): 100 mg via ORAL
  Filled 2022-05-23 (×5): qty 1

## 2022-05-23 MED ORDER — PANTOPRAZOLE SODIUM 20 MG PO TBEC
20.0000 mg | DELAYED_RELEASE_TABLET | Freq: Two times a day (BID) | ORAL | Status: DC
  Administered 2022-05-23 – 2022-05-24 (×3): 20 mg via ORAL
  Filled 2022-05-23 (×3): qty 1

## 2022-05-23 MED ORDER — MOMETASONE FURO-FORMOTEROL FUM 200-5 MCG/ACT IN AERO
2.0000 | INHALATION_SPRAY | Freq: Two times a day (BID) | RESPIRATORY_TRACT | Status: DC
  Administered 2022-05-23 – 2022-05-24 (×2): 2 via RESPIRATORY_TRACT
  Filled 2022-05-23: qty 8.8

## 2022-05-23 MED ORDER — IRBESARTAN 300 MG PO TABS
300.0000 mg | ORAL_TABLET | Freq: Every day | ORAL | Status: DC
  Administered 2022-05-23 – 2022-05-24 (×2): 300 mg via ORAL
  Filled 2022-05-23 (×2): qty 1

## 2022-05-23 MED ORDER — SODIUM CHLORIDE 0.9 % IV SOLN
2.0000 g | Freq: Once | INTRAVENOUS | Status: AC
  Administered 2022-05-23: 2 g via INTRAVENOUS
  Filled 2022-05-23: qty 20

## 2022-05-23 MED ORDER — FENTANYL CITRATE PF 50 MCG/ML IJ SOSY: 50.0000 ug | PREFILLED_SYRINGE | INTRAMUSCULAR | Status: DC | PRN

## 2022-05-23 MED ORDER — ROSUVASTATIN CALCIUM 20 MG PO TABS
20.0000 mg | ORAL_TABLET | Freq: Every day | ORAL | Status: DC
  Administered 2022-05-23 – 2022-05-24 (×2): 20 mg via ORAL
  Filled 2022-05-23 (×2): qty 1

## 2022-05-23 MED ORDER — FENTANYL CITRATE PF 50 MCG/ML IJ SOSY
25.0000 ug | PREFILLED_SYRINGE | Freq: Once | INTRAMUSCULAR | Status: AC
  Administered 2022-05-23: 25 ug via INTRAVENOUS
  Filled 2022-05-23: qty 1

## 2022-05-23 MED ORDER — SODIUM CHLORIDE 0.9 % IV SOLN: INTRAVENOUS | Status: AC

## 2022-05-23 MED ORDER — ACETAMINOPHEN 325 MG PO TABS
650.0000 mg | ORAL_TABLET | Freq: Four times a day (QID) | ORAL | Status: DC | PRN
  Administered 2022-05-23 – 2022-05-24 (×2): 650 mg via ORAL
  Filled 2022-05-23 (×2): qty 2

## 2022-05-23 MED ORDER — ENOXAPARIN SODIUM 40 MG/0.4ML IJ SOSY
40.0000 mg | PREFILLED_SYRINGE | INTRAMUSCULAR | Status: DC
  Administered 2022-05-23 – 2022-05-24 (×2): 40 mg via SUBCUTANEOUS
  Filled 2022-05-23 (×2): qty 0.4

## 2022-05-23 NOTE — Progress Notes (Addendum)
Patients CBG 242. No coverage orders. Charlann Lange MD notifed.

## 2022-05-23 NOTE — ED Notes (Signed)
[facility] report called to [facility]N at this time. Transport called but no ETA.

## 2022-05-23 NOTE — Progress Notes (Signed)
Pt refused CPAP for tonight. States she feels fine sleeping without it, will call if she changes her mind.

## 2022-05-23 NOTE — ED Notes (Signed)
Pt, belongings, and paperwork transferred via [facility] at this time, RN assuming care made aware.

## 2022-05-23 NOTE — ED Notes (Signed)
Recalled [facility] @ [id number] spoke to [place]

## 2022-05-23 NOTE — Progress Notes (Signed)
Pharmacy Antibiotic Note  Charlene Juarez is a 71 y.o. female admitted on 05/22/2022 with  intra-abdominal infection .  On admission, patient presenting with abdominal pain, afebrile, WBC 11.6. Pharmacy has been consulted for Zosyn dosing.  Plan: Zosyn 3.375g IV q8h (4 hour infusion). Monitor clinical status, cultures, renal function. Follow-up LOT, de-escalate as able.  Height: 5' 2.01" (157.5 cm) Weight: 72.9 kg (160 lb 11.5 oz) IBW/kg (Calculated) : 50.12  Temp (24hrs), Avg:99.1 F (37.3 C), Min:98.7 F (37.1 C), Max:99.5 F (37.5 C)  Recent Labs  Lab 05/22/22 2231  WBC 11.6*  CREATININE 0.80    Estimated Creatinine Clearance: 61.2 mL/min (by C-G formula based on SCr of 0.8 mg/dL).    Allergies  Allergen Reactions   Codeine Sulfate Diarrhea and Nausea And Vomiting   Lisinopril Cough    Antimicrobials this admission: Ceftriaxone 9/3 x 1 Zosyn 9/3 >>     Thank you for allowing pharmacy to be a part of this patient's care.  Vance Peper, PharmD PGY-2 Pharmacy Resident Phone [phone] 05/23/2022 7:27 AM   Please check AMION for all [facility] phone numbers After 10:00 PM, call [facility] [phone]

## 2022-05-23 NOTE — ED Notes (Signed)
Consult to hospitalist called to [facility]

## 2022-05-23 NOTE — Plan of Care (Signed)
[facility] will assume care on arrival to accepting facility. Until arrival, care as per EDP. However, [facility] available 24/7 for questions and assistance.  Nursing staff, please page [facility] Admits and Consults ([phone]) as soon as the patient arrives to the hospital.   

## 2022-05-23 NOTE — ED Notes (Signed)
Charlene Juarez - son - [phone]

## 2022-05-23 NOTE — Consult Note (Signed)
CC/Reason for consult: Possible cholecystitis  Requesting MD: Addison Lank MD  HPI: Charlene Juarez is an 71 y.o. female hx HTN, DM (last a1c 9), HLD, OSA, GERD with ~1d hx of MEG pain - aching/gnawing, moderate in severity and progressive. No aggrav/allev factors. No n/v. No constipation nor diarrhea. Presented to [facility] and underwent workup. CT A/P suspicious for possible cholecystitis although no gallstones are seen. We were asked to see.  Pt states she's had this pain before when she eats late at night, does not feel like reflux though.  Denies recent etoh use    Past Medical History:  Diagnosis Date   Arthritis    Bursitis    Cancer ([place])    SKIN   Diabetes mellitus without complication (HCC)    GERD (gastroesophageal reflux disease)    Hyperlipidemia    Hypertension    Sleep apnea     Past Surgical History:  Procedure Laterality Date   cancer removal off nose     CATARACT EXTRACTION W/PHACO Right 01/13/2017   Procedure: CATARACT EXTRACTION PHACO AND INTRAOCULAR LENS PLACEMENT ([place]);  Surgeon: Eulogio Bear, MD;  Location: [facility] ORS;  Service: Ophthalmology;  Laterality: Right;  [place] 31.1 AP% 0.0 CDE 1.78 Fluid Pack lot # [id number]   CATARACT EXTRACTION W/PHACO Left 02/24/2017   Procedure: CATARACT EXTRACTION PHACO AND INTRAOCULAR LENS PLACEMENT ([facility]);  Surgeon: Eulogio Bear, MD;  Location: [facility] ORS;  Service: Ophthalmology;  Laterality: Left;  [place]  00:45.5 AP  0.7 CDE   2.76  fluid pack lot # [id number]  exp. 08-19-2018   DILATION AND CURETTAGE OF UTERUS     ESOPHAGOGASTRODUODENOSCOPY (EGD) WITH PROPOFOL N/A 01/06/2022   Procedure: ESOPHAGOGASTRODUODENOSCOPY (EGD) WITH PROPOFOL;  Surgeon: Jonathon Bellows, MD;  Location[facility] ENDOSCOPY;  Service: Gastroenterology;  Laterality: N/A;   EYE SURGERY     rotator cuff surgery     TUBAL LIGATION      Family History  Problem Relation Age of Onset   Alcohol abuse Mother    Heart disease Father    Alcohol  abuse Father    Diabetes Sister    Hyperlipidemia Sister    Diabetes Brother    Hypertension Brother    Thyroid disease Daughter    Diabetes Maternal Grandmother    Hypertension Sister    Aneurysm Sister    Diabetes Sister    Diabetes Sister    Breast cancer Maternal Aunt     Social:  reports that she has never smoked. She has never used smokeless tobacco. She reports that she does not drink alcohol and does not use drugs.  Allergies:  Allergies  Allergen Reactions   Codeine Sulfate Diarrhea and Nausea And Vomiting   Lisinopril Cough    Medications: I have reviewed the patient's current medications.  Results for orders placed or performed during the hospital encounter of 05/22/22 (from the past 48 hour(s))  Comprehensive metabolic panel     Status: Abnormal   Collection Time: 05/22/22 10:31 PM  Result Value Ref Range   Sodium 137 135 - 145 mmol/L   Potassium 3.8 3.5 - 5.1 mmol/L   Chloride 98 98 - 111 mmol/L   CO2 28 22 - 32 mmol/L   Glucose, Bld 146 (H) 70 - 99 mg/dL    Comment: Glucose reference range applies only to samples taken after fasting for at least 8 hours.   BUN 21 8 - 23 mg/dL   Creatinine, Ser 0.80 0.44 - 1.00 mg/dL  Calcium 8.9 8.9 - 10.3 mg/dL   Total Protein 6.5 6.5 - 8.1 g/dL   Albumin 4.3 3.5 - 5.0 g/dL   AST 108 (H) 15 - 41 U/L   ALT 99 (H) 0 - 44 U/L   Alkaline Phosphatase 51 38 - 126 U/L   Total Bilirubin 0.8 0.3 - 1.2 mg/dL   GFR, Estimated >60 >60 mL/min    Comment: (NOTE) Calculated using the CKD-EPI Creatinine Equation (2021)    Anion gap 11 5 - 15    Comment: Performed at [facility], [place], [place] [address]  Lipase, blood     Status: Abnormal   Collection Time: 05/22/22 10:31 PM  Result Value Ref Range   Lipase 117 (H) 11 - 51 U/L    Comment: Performed at [facility], [address] [place], [place] [address]  CBC with Differential     Status: Abnormal   Collection Time:  05/22/22 10:31 PM  Result Value Ref Range   WBC 11.6 (H) 4.0 - 10.5 K/uL   RBC 4.64 3.87 - 5.11 MIL/uL   Hemoglobin 13.6 12.0 - 15.0 g/dL   HCT 40.6 36.0 - 46.0 %   MCV 87.5 80.0 - 100.0 fL   MCH 29.3 26.0 - 34.0 pg   MCHC 33.5 30.0 - 36.0 g/dL   RDW 13.0 11.5 - 15.5 %   Platelets 197 150 - 400 K/uL   nRBC 0.0 0.0 - 0.2 %   Neutrophils Relative % 78 %   Neutro Abs 9.0 (H) 1.7 - 7.7 K/uL   Lymphocytes Relative 16 %   Lymphs Abs 1.8 0.7 - 4.0 K/uL   Monocytes Relative 5 %   Monocytes Absolute 0.6 0.1 - 1.0 K/uL   Eosinophils Relative 1 %   Eosinophils Absolute 0.1 0.0 - 0.5 K/uL   Basophils Relative 0 %   Basophils Absolute 0.0 0.0 - 0.1 K/uL   Immature Granulocytes 0 %   Abs Immature Granulocytes 0.03 0.00 - 0.07 K/uL    Comment: Performed at [facility], [place], [place] [address]  Glucose, capillary     Status: Abnormal   Collection Time: 05/23/22  6:10 AM  Result Value Ref Range   Glucose-Capillary 152 (H) 70 - 99 mg/dL    Comment: Glucose reference range applies only to samples taken after fasting for at least 8 hours.  CBC with Differential/Platelet     Status: None   Collection Time: 05/23/22  7:36 AM  Result Value Ref Range   WBC 9.0 4.0 - 10.5 K/uL   RBC 4.45 3.87 - 5.11 MIL/uL   Hemoglobin 13.2 12.0 - 15.0 g/dL   HCT 39.4 36.0 - 46.0 %   MCV 88.5 80.0 - 100.0 fL   MCH 29.7 26.0 - 34.0 pg   MCHC 33.5 30.0 - 36.0 g/dL   RDW 13.0 11.5 - 15.5 %   Platelets 176 150 - 400 K/uL   nRBC 0.0 0.0 - 0.2 %   Neutrophils Relative % 77 %   Neutro Abs 6.9 1.7 - 7.7 K/uL   Lymphocytes Relative 15 %   Lymphs Abs 1.4 0.7 - 4.0 K/uL   Monocytes Relative 8 %   Monocytes Absolute 0.7 0.1 - 1.0 K/uL   Eosinophils Relative 0 %   Eosinophils Absolute 0.0 0.0 - 0.5 K/uL   Basophils Relative 0 %   Basophils Absolute 0.0 0.0 - 0.1 K/uL   Immature Granulocytes 0 %   Abs  Immature Granulocytes 0.03 0.00 - 0.07 K/uL    Comment: Performed at [facility], [place] [address], [place],  [address]  Comprehensive metabolic panel     Status: Abnormal   Collection Time: 05/23/22  7:36 AM  Result Value Ref Range   Sodium 136 135 - 145 mmol/L   Potassium 3.8 3.5 - 5.1 mmol/L   Chloride 104 98 - 111 mmol/L   CO2 25 22 - 32 mmol/L   Glucose, Bld 142 (H) 70 - 99 mg/dL    Comment: Glucose reference range applies only to samples taken after fasting for at least 8 hours.   BUN 14 8 - 23 mg/dL   Creatinine, Ser 0.76 0.44 - 1.00 mg/dL   Calcium 8.6 (L) 8.9 - 10.3 mg/dL   Total Protein 5.8 (L) 6.5 - 8.1 g/dL   Albumin 3.4 (L) 3.5 - 5.0 g/dL   AST 135 (H) 15 - 41 U/L   ALT 165 (H) 0 - 44 U/L   Alkaline Phosphatase 57 38 - 126 U/L   Total Bilirubin 0.8 0.3 - 1.2 mg/dL   GFR, Estimated >60 >60 mL/min    Comment: (NOTE) Calculated using the CKD-EPI Creatinine Equation (2021)    Anion gap 7 5 - 15    Comment: Performed at [facility], [place] [address], [place], [place] [address]    US Abdomen Limited RUQ (LIVER/GB)  Result Date: 05/23/2022 CLINICAL DATA:  Abdominal pain. Elevated lipase, AST, ALT and wbc's. EXAM: ULTRASOUND ABDOMEN LIMITED RIGHT UPPER QUADRANT COMPARISON:  CT AP 05/22/2022 FINDINGS: Gallbladder: No gallstones or wall thickening visualized. No sonographic Murphy sign noted by sonographer. Pericholecystic fluid noted. Common bile duct: Diameter: 4.9 mm. Liver: Marked diffuse increased parenchymal echogenicity compatible with hepatic steatosis. No focal liver abnormality identified. Portal vein is patent on color Doppler imaging with normal direction of blood flow towards the liver. Other: None. IMPRESSION: 1. Pericholecystic fluid noted without gallstones, gallbladder wall thickening or sonographic Murphy's sign. 2. Hepatic steatosis. Electronically Signed   By: Kerby Moors M.D.   On: 05/23/2022 08:16   CT ABDOMEN PELVIS W CONTRAST  Result Date: 05/22/2022 CLINICAL DATA:  Abdominal pain. EXAM: CT ABDOMEN AND PELVIS  WITH CONTRAST TECHNIQUE: Multidetector CT imaging of the abdomen and pelvis was performed using the standard protocol following bolus administration of intravenous contrast. RADIATION DOSE REDUCTION: This exam was performed according to the departmental dose-optimization program which includes automated exposure control, adjustment of the mA and/or kV according to patient size and/or use of iterative reconstruction technique. CONTRAST:  163m OMNIPAQUE IOHEXOL 300 MG/ML  SOLN COMPARISON:  None Available. FINDINGS: Lower chest: The visualized lung bases are clear. No intra-abdominal free air or free fluid. Hepatobiliary: Fatty liver. The gallbladder is distended. There is trace pericholecystic fluid. No calcified gallstone identified. Ultrasound may provide better evaluation of the gallbladder if there is clinical concern for acute cholecystitis. Pancreas: Unremarkable. No pancreatic ductal dilatation or surrounding inflammatory changes. Spleen: Normal in size without focal abnormality. Adrenals/Urinary Tract: The adrenal glands unremarkable. There is no hydronephrosis on either side. There is symmetric enhancement and excretion of contrast by both kidneys. The visualized ureters and urinary bladder appear unremarkable. Stomach/Bowel: There is no bowel obstruction or active inflammation. The appendix is normal. Vascular/Lymphatic: Mild aortoiliac atherosclerotic disease. The IVC is unremarkable. No portal venous gas. There is no adenopathy. Reproductive: The uterus is anteverted and grossly unremarkable. No adnexal masses. Other: None Musculoskeletal: Mild degenerative changes. No acute osseous pathology. IMPRESSION:  1. Distended gallbladder with trace pericholecystic fluid. No calcified gallstone identified. Ultrasound may provide better evaluation of the gallbladder if there is clinical concern for acute cholecystitis. 2. Fatty liver. 3. No bowel obstruction. Normal appendix. 4. Aortic Atherosclerosis  (ICD10-I70.0). Electronically Signed   By: Anner Crete M.D.   On: 05/22/2022 23:55    ROS - all of the below systems have been reviewed with the patient and positives are indicated with bold text General: chills, fever or night sweats Eyes: blurry vision or double vision ENT: epistaxis or sore throat Allergy/Immunology: itchy/watery eyes or nasal congestion Hematologic/Lymphatic: bleeding problems, blood clots or swollen lymph nodes Endocrine: temperature intolerance or unexpected weight changes Breast: new or changing breast lumps or nipple discharge Resp: cough, shortness of breath, or wheezing CV: chest pain or dyspnea on exertion GI: as per HPI GU: dysuria, trouble voiding, or hematuria MSK: joint pain or joint stiffness Neuro: TIA or stroke symptoms Derm: pruritus and skin lesion changes Psych: anxiety and depression  PE Blood pressure 109/65, pulse 89, temperature 98.3 F (36.8 C), temperature source Oral, resp. rate 18, height 5' 2.01" (1.575 m), weight 72.9 kg, last menstrual period 07/13/2000, SpO2 96 %. Constitutional: NAD; conversant Eyes: Moist conjunctiva; no lid lag; anicteric Lungs: Normal respiratory effort CV: RRR; no pitting edema GI: Abd sid mid epigastric pain, moderate RUQ pain; no palpable hepatosplenomegaly MSK: Normal range of motion of extremities Psychiatric: Appropriate affect; alert and oriented x3  Results for orders placed or performed during the hospital encounter of 05/22/22 (from the past 48 hour(s))  Comprehensive metabolic panel     Status: Abnormal   Collection Time: 05/22/22 10:31 PM  Result Value Ref Range   Sodium 137 135 - 145 mmol/L   Potassium 3.8 3.5 - 5.1 mmol/L   Chloride 98 98 - 111 mmol/L   CO2 28 22 - 32 mmol/L   Glucose, Bld 146 (H) 70 - 99 mg/dL    Comment: Glucose reference range applies only to samples taken after fasting for at least 8 hours.   BUN 21 8 - 23 mg/dL   Creatinine, Ser 0.80 0.44 - 1.00 mg/dL   Calcium 8.9  8.9 - 10.3 mg/dL   Total Protein 6.5 6.5 - 8.1 g/dL   Albumin 4.3 3.5 - 5.0 g/dL   AST 108 (H) 15 - 41 U/L   ALT 99 (H) 0 - 44 U/L   Alkaline Phosphatase 51 38 - 126 U/L   Total Bilirubin 0.8 0.3 - 1.2 mg/dL   GFR, Estimated >60 >60 mL/min    Comment: (NOTE) Calculated using the CKD-EPI Creatinine Equation (2021)    Anion gap 11 5 - 15    Comment: Performed at [facility], [place], [place] [address]  Lipase, blood     Status: Abnormal   Collection Time: 05/22/22 10:31 PM  Result Value Ref Range   Lipase 117 (H) 11 - 51 U/L    Comment: Performed at [facility], [address], [place], [place] [address]  CBC with Differential     Status: Abnormal   Collection Time: 05/22/22 10:31 PM  Result Value Ref Range   WBC 11.6 (H) 4.0 - 10.5 K/uL   RBC 4.64 3.87 - 5.11 MIL/uL   Hemoglobin 13.6 12.0 - 15.0 g/dL   HCT 40.6 36.0 - 46.0 %   MCV 87.5 80.0 - 100.0 fL   MCH 29.3 26.0 - 34.0 pg   MCHC 33.5 30.0 - 36.0 g/dL  RDW 13.0 11.5 - 15.5 %   Platelets 197 150 - 400 K/uL   nRBC 0.0 0.0 - 0.2 %   Neutrophils Relative % 78 %   Neutro Abs 9.0 (H) 1.7 - 7.7 K/uL   Lymphocytes Relative 16 %   Lymphs Abs 1.8 0.7 - 4.0 K/uL   Monocytes Relative 5 %   Monocytes Absolute 0.6 0.1 - 1.0 K/uL   Eosinophils Relative 1 %   Eosinophils Absolute 0.1 0.0 - 0.5 K/uL   Basophils Relative 0 %   Basophils Absolute 0.0 0.0 - 0.1 K/uL   Immature Granulocytes 0 %   Abs Immature Granulocytes 0.03 0.00 - 0.07 K/uL    Comment: Performed at [facility], [place], [place] [address]  Glucose, capillary     Status: Abnormal   Collection Time: 05/23/22  6:10 AM  Result Value Ref Range   Glucose-Capillary 152 (H) 70 - 99 mg/dL    Comment: Glucose reference range applies only to samples taken after fasting for at least 8 hours.  CBC with Differential/Platelet     Status: None   Collection Time: 05/23/22  7:36 AM   Result Value Ref Range   WBC 9.0 4.0 - 10.5 K/uL   RBC 4.45 3.87 - 5.11 MIL/uL   Hemoglobin 13.2 12.0 - 15.0 g/dL   HCT 39.4 36.0 - 46.0 %   MCV 88.5 80.0 - 100.0 fL   MCH 29.7 26.0 - 34.0 pg   MCHC 33.5 30.0 - 36.0 g/dL   RDW 13.0 11.5 - 15.5 %   Platelets 176 150 - 400 K/uL   nRBC 0.0 0.0 - 0.2 %   Neutrophils Relative % 77 %   Neutro Abs 6.9 1.7 - 7.7 K/uL   Lymphocytes Relative 15 %   Lymphs Abs 1.4 0.7 - 4.0 K/uL   Monocytes Relative 8 %   Monocytes Absolute 0.7 0.1 - 1.0 K/uL   Eosinophils Relative 0 %   Eosinophils Absolute 0.0 0.0 - 0.5 K/uL   Basophils Relative 0 %   Basophils Absolute 0.0 0.0 - 0.1 K/uL   Immature Granulocytes 0 %   Abs Immature Granulocytes 0.03 0.00 - 0.07 K/uL    Comment: Performed at [facility], [address], [place], [place] [address]  Comprehensive metabolic panel     Status: Abnormal   Collection Time: 05/23/22  7:36 AM  Result Value Ref Range   Sodium 136 135 - 145 mmol/L   Potassium 3.8 3.5 - 5.1 mmol/L   Chloride 104 98 - 111 mmol/L   CO2 25 22 - 32 mmol/L   Glucose, Bld 142 (H) 70 - 99 mg/dL    Comment: Glucose reference range applies only to samples taken after fasting for at least 8 hours.   BUN 14 8 - 23 mg/dL   Creatinine, Ser 0.76 0.44 - 1.00 mg/dL   Calcium 8.6 (L) 8.9 - 10.3 mg/dL   Total Protein 5.8 (L) 6.5 - 8.1 g/dL   Albumin 3.4 (L) 3.5 - 5.0 g/dL   AST 135 (H) 15 - 41 U/L   ALT 165 (H) 0 - 44 U/L   Alkaline Phosphatase 57 38 - 126 U/L   Total Bilirubin 0.8 0.3 - 1.2 mg/dL   GFR, Estimated >60 >60 mL/min    Comment: (NOTE) Calculated using the CKD-EPI Creatinine Equation (2021)    Anion gap 7 5 - 15    Comment: Performed at [facility], [place]  [address], [place], [place] [address]    US Abdomen Limited RUQ (LIVER/GB)  Result Date: 05/23/2022 CLINICAL DATA:  Abdominal pain. Elevated lipase, AST, ALT and wbc's. EXAM: ULTRASOUND ABDOMEN LIMITED RIGHT UPPER QUADRANT COMPARISON:  CT AP 05/22/2022 FINDINGS:  Gallbladder: No gallstones or wall thickening visualized. No sonographic Murphy sign noted by sonographer. Pericholecystic fluid noted. Common bile duct: Diameter: 4.9 mm. Liver: Marked diffuse increased parenchymal echogenicity compatible with hepatic steatosis. No focal liver abnormality identified. Portal vein is patent on color Doppler imaging with normal direction of blood flow towards the liver. Other: None. IMPRESSION: 1. Pericholecystic fluid noted without gallstones, gallbladder wall thickening or sonographic Murphy's sign. 2. Hepatic steatosis. Electronically Signed   By: Kerby Moors M.D.   On: 05/23/2022 08:16   CT ABDOMEN PELVIS W CONTRAST  Result Date: 05/22/2022 CLINICAL DATA:  Abdominal pain. EXAM: CT ABDOMEN AND PELVIS WITH CONTRAST TECHNIQUE: Multidetector CT imaging of the abdomen and pelvis was performed using the standard protocol following bolus administration of intravenous contrast. RADIATION DOSE REDUCTION: This exam was performed according to the departmental dose-optimization program which includes automated exposure control, adjustment of the mA and/or kV according to patient size and/or use of iterative reconstruction technique. CONTRAST:  186m OMNIPAQUE IOHEXOL 300 MG/ML  SOLN COMPARISON:  None Available. FINDINGS: Lower chest: The visualized lung bases are clear. No intra-abdominal free air or free fluid. Hepatobiliary: Fatty liver. The gallbladder is distended. There is trace pericholecystic fluid. No calcified gallstone identified. Ultrasound may provide better evaluation of the gallbladder if there is clinical concern for acute cholecystitis. Pancreas: Unremarkable. No pancreatic ductal dilatation or surrounding inflammatory changes. Spleen: Normal in size without focal abnormality. Adrenals/Urinary Tract: The adrenal glands unremarkable. There is no hydronephrosis on either side. There is symmetric enhancement and excretion of contrast by both kidneys. The visualized ureters  and urinary bladder appear unremarkable. Stomach/Bowel: There is no bowel obstruction or active inflammation. The appendix is normal. Vascular/Lymphatic: Mild aortoiliac atherosclerotic disease. The IVC is unremarkable. No portal venous gas. There is no adenopathy. Reproductive: The uterus is anteverted and grossly unremarkable. No adnexal masses. Other: None Musculoskeletal: Mild degenerative changes. No acute osseous pathology. IMPRESSION: 1. Distended gallbladder with trace pericholecystic fluid. No calcified gallstone identified. Ultrasound may provide better evaluation of the gallbladder if there is clinical concern for acute cholecystitis. 2. Fatty liver. 3. No bowel obstruction. Normal appendix. 4. Aortic Atherosclerosis (ICD10-I70.0). Electronically Signed   By: AAnner CreteM.D.   On: 05/22/2022 23:55    I have personally reviewed the relevant CT A/P, CBC, CMP  A/P: Charlene Thurgoodis an 71y.o. female with HTN, DM (last a1c 9), HLD, OSA, GERD here with possible cholecystitis but RUQ [place]ows no signs of cholecystitis. -transaminases elevated with normal alk phos and mildly elevated lipase, ?hepatic etiology or mild pancreatitis -Repeat Lipase this AM  -Cont medical workup  -ok for clears  I spent a total of 45 minutes in both face-to-face and non-face-to-face activities, excluding procedures performed, for this visit on the date of this encounter.  ARosario Adie MD  [facility]

## 2022-05-23 NOTE — H&P (Signed)
History and Physical    Brittannie Tawney ATF:[id number] DOB: 10/15/1950 DOA: 05/22/2022  PCP: Venita Lick, NP Patient coming from: home  Chief Complaint: Epigastric pain  HPI: Charlene Juarez is a 71 y.o. female with medical history significant of T2DM, GERD, HTN, HLD, OSA, Non-alcohol abuse who presented with epigastric pain.  Patient reports chronic intermittent epigastric pain usually after meals for several months. She underwent EGD which showed H pylori. Patient reports that she completed a course of a treatment for H. pylori.  Patient reports recurrent epigastric pain 2 days ago.  The pain is located to mid abdomen epigastric area, aching, constant, moderate, no radiation.  Associated symptoms included nausea.  But denies fevers, vomiting, diarrhea.  Denies cough, wheezing, shortness of breath, chest pain, palpitations, dysuria, urinary frequency or urgency.  Patient went to outside ED and was transferred to [facility] health for further management.  In the emergency room, Tmax 99.5 F, pulse 102, RR 18, BP 136/59 O2 sats 97% on room air.  Labs showed lipase 117, AST 108, ALT 99, total bilirubin 0.8, ALP 51, WBC 11.6. CT abdomen showed Distended gallbladder with trace pericholecystic fluid. No calcified gallstone identified.  Fatty liver.  Unremarkable Pancreas.   Review of Systems: As per HPI otherwise 10 point review of systems negative.  Review of Systems Otherwise negative except as per HPI, including: General: Denies fever, chills, night sweats or unintended weight loss. Resp: Denies cough, wheezing, shortness of breath. Cardiac: Denies chest pain, palpitations, orthopnea, paroxysmal nocturnal dyspnea. GI: Denies vomiting, diarrhea or constipation.  Positive for epigastric pain and nausea. GU: Denies dysuria, frequency, hesitancy or incontinence MS: Denies muscle aches, joint pain or swelling Neuro: Denies headache, neurologic deficits (focal weakness,  numbness, tingling), abnormal gait Psych: Denies anxiety, depression, SI/HI/AVH Skin: Denies new rashes or lesions ID: Denies sick contacts, exotic exposures, travel  Past Medical History:  Diagnosis Date   Arthritis    Bursitis    Cancer ([place])    SKIN   Diabetes mellitus without complication ([place])    GERD (gastroesophageal reflux disease)    Hyperlipidemia    Hypertension    Sleep apnea     Past Surgical History:  Procedure Laterality Date   cancer removal off nose     CATARACT EXTRACTION W/PHACO Right 01/13/2017   Procedure: CATARACT EXTRACTION PHACO AND INTRAOCULAR LENS PLACEMENT ([place]);  Surgeon: Eulogio Bear, MD;  Location: [facility] ORS;  Service: Ophthalmology;  Laterality: Right;  [place] 31.1 AP% 0.0 CDE 1.78 Fluid Pack lot # [id number]   CATARACT EXTRACTION W/PHACO Left 02/24/2017   Procedure: CATARACT EXTRACTION PHACO AND INTRAOCULAR LENS PLACEMENT ([facility]);  Surgeon: Eulogio Bear, MD;  Location: [facility] ORS;  Service: Ophthalmology;  Laterality: Left;  [place]  00:45.5 AP  0.7 CDE   2.76  fluid pack lot # [id number]  exp. 08-19-2018   DILATION AND CURETTAGE OF UTERUS     ESOPHAGOGASTRODUODENOSCOPY (EGD) WITH PROPOFOL N/A 01/06/2022   Procedure: ESOPHAGOGASTRODUODENOSCOPY (EGD) WITH PROPOFOL;  Surgeon: Jonathon Bellows, MD;  Location: [facility] ENDOSCOPY;  Service: Gastroenterology;  Laterality: N/A;   EYE SURGERY     rotator cuff surgery     TUBAL LIGATION      SOCIAL HISTORY:  reports that she has never smoked. She has never used smokeless tobacco. She reports that she does not drink alcohol and does not use drugs.  Allergies  Allergen Reactions   Codeine Sulfate Diarrhea and Nausea And Vomiting   Lisinopril Cough  FAMILY HISTORY: Family History  Problem Relation Age of Onset   Alcohol abuse Mother    Heart disease Father    Alcohol abuse Father    Diabetes Sister    Hyperlipidemia Sister    Diabetes Brother    Hypertension Brother    Thyroid disease Daughter     Diabetes Maternal Grandmother    Hypertension Sister    Aneurysm Sister    Diabetes Sister    Diabetes Sister    Breast cancer Maternal Aunt      Prior to Admission medications   Medication Sig Start Date End Date Taking? Authorizing Provider  albuterol (VENTOLIN HFA) 108 (90 Base) MCG/ACT inhaler TAKE 2 PUFFS BY MOUTH EVERY 6 HOURS AS NEEDED FOR WHEEZE OR SHORTNESS OF BREATH 01/18/22   Cannady, Henrine Screws T, NP  Ascorbic Acid (VITAMIN C) 1000 MG tablet Take 1,000 mg by mouth daily.    [provider]  Cholecalciferol (VITAMIN D3) 1000 units CAPS Take 1,000 Units by mouth at bedtime.    [provider]  empagliflozin (JARDIANCE) 25 MG TABS tablet Take 1 tablet (25 mg total) by mouth daily. 11/26/21   Cannady, Henrine Screws T, NP  gabapentin (NEURONTIN) 100 MG capsule Take 1 capsule (100 mg total) by mouth 3 (three) times daily. 03/05/22   Cannady, Henrine Screws T, NP  Ginkgo Biloba 40 MG TABS Take 1 tablet by mouth daily.    [provider]  glucose blood (CONTOUR TEST) test strip USE TO TEST BLOOD SUGAR ONCE A DAY == DX E11.69 03/09/22   Cannady, Jolene T, NP  hydrochlorothiazide (HYDRODIURIL) 25 MG tablet TAKE 1 TABLET (25 MG TOTAL) BY MOUTH DAILY. 02/22/22   Cannady, Henrine Screws T, NP  Magnesium 500 MG TABS Take 500 mg by mouth at bedtime.    [provider]  metFORMIN (GLUCOPHAGE) 1000 MG tablet Take 1 tablet (1,000 mg total) by mouth 2 (two) times daily with a meal. 02/12/21   Cannady, Jolene T, NP  Omega-3 Fatty Acids (FISH OIL) 1200 MG CAPS Take 1,200-2,400 mg by mouth 2 (two) times daily. 1 in the am and 2 at night    [provider]  pantoprazole (PROTONIX) 20 MG tablet Take 1 tablet (20 mg total) by mouth 2 (two) times daily. 01/13/22   Jonathon Bellows, MD  rosuvastatin (CRESTOR) 20 MG tablet Take 1 tablet (20 mg total) by mouth daily. 02/12/21   Cannady, Henrine Screws T, NP  SYMBICORT 160-4.5 MCG/ACT inhaler TAKE 2 PUFFS BY MOUTH TWICE A DAY 11/19/20   Cannady, Jolene T, NP   telmisartan (MICARDIS) 80 MG tablet Take 1 tablet (80 mg total) by mouth daily. 02/12/21   Cannady, Henrine Screws T, NP  tirzepatide (MOUNJARO) 7.5 MG/0.5ML Pen Inject 7.5 mg into the skin once a week. 03/05/22   Marnee Guarneri T, NP    Physical Exam: Vitals:   05/23/22 0500 05/23/22 0609 05/23/22 0700 05/23/22 0806  BP: 110/66 116/67  109/65  Pulse: 94 (!) 103  89  Resp: (!) '23 18  18  '$ Temp: 99.1 F (37.3 C) 98.7 F (37.1 C)  98.3 F (36.8 C)  TempSrc: Oral Oral  Oral  SpO2: 94% 97%  96%  Weight:   72.9 kg   Height:   5' 2.01" (1.575 m)       Constitutional: NAD, calm, comfortable Eyes: PERRL, lids and conjunctivae normal ENMT: Mucous membranes are moist. Posterior pharynx clear of any exudate or lesions.Normal dentition.  Neck: normal, supple, no masses, no thyromegaly Respiratory: clear to  auscultation bilaterally, no wheezing, no crackles. Normal respiratory effort. No accessory muscle use.  Cardiovascular: Regular rate and rhythm, no murmurs / rubs / gallops. No extremity edema. 2+ pedal pulses. No carotid bruits.  Abdomen: RUQ and epigastric area positive tenderness with no rebound tenderness or guarding, no masses palpated. No hepatosplenomegaly. Bowel sounds positive.  Musculoskeletal: no clubbing / cyanosis. No joint deformity upper and lower extremities. Good ROM, no contractures. Normal muscle tone.  Skin: no rashes, lesions, ulcers. No induration Neurologic: CN 2-12 grossly intact. Sensation intact, DTR normal. Strength 5/5 in all 4.  Psychiatric: Normal judgment and insight. Alert and oriented x 3. Normal mood.     Labs on Admission: I have personally reviewed following labs and imaging studies  CBC: Recent Labs  Lab 05/22/22 2231 05/23/22 0736  WBC 11.6* 9.0  NEUTROABS 9.0* 6.9  HGB 13.6 13.2  HCT 40.6 39.4  MCV 87.5 88.5  PLT 197 086   Basic Metabolic Panel: Recent Labs  Lab 05/22/22 2231 05/23/22 0736  Mishika Flippen 137 136  K 3.8 3.8  CL 98 104  CO2 28 25   GLUCOSE 146* 142*  BUN 21 14  CREATININE 0.80 0.76  CALCIUM 8.9 8.6*   GFR: Estimated Creatinine Clearance: 61.2 mL/min (by C-G formula based on SCr of 0.76 mg/dL). Liver Function Tests: Recent Labs  Lab 05/22/22 2231 05/23/22 0736  AST 108* 135*  ALT 99* 165*  ALKPHOS 51 57  BILITOT 0.8 0.8  PROT 6.5 5.8*  ALBUMIN 4.3 3.4*   Recent Labs  Lab 05/22/22 2231  LIPASE 117*   No results for input(s): "AMMONIA" in the last 168 hours. Coagulation Profile: No results for input(s): "INR", "PROTIME" in the last 168 hours. Cardiac Enzymes: No results for input(s): "CKTOTAL", "CKMB", "CKMBINDEX", "TROPONINI" in the last 168 hours. BNP (last 3 results) No results for input(s): "PROBNP" in the last 8760 hours. HbA1C: No results for input(s): "HGBA1C" in the last 72 hours. CBG: Recent Labs  Lab 05/23/22 0610  GLUCAP 152*   Lipid Profile: No results for input(s): "CHOL", "HDL", "LDLCALC", "TRIG", "CHOLHDL", "LDLDIRECT" in the last 72 hours. Thyroid Function Tests: No results for input(s): "TSH", "T4TOTAL", "FREET4", "T3FREE", "THYROIDAB" in the last 72 hours. Anemia Panel: No results for input(s): "VITAMINB12", "FOLATE", "FERRITIN", "TIBC", "IRON", "RETICCTPCT" in the last 72 hours. Urine analysis: No results found for: "COLORURINE", "APPEARANCEUR", "LABSPEC", "PHURINE", "GLUCOSEU", "HGBUR", "BILIRUBINUR", "KETONESUR", "PROTEINUR", "UROBILINOGEN", "NITRITE", "LEUKOCYTESUR" Sepsis Labs: !!!!!!!!!!!!!!!!!!!!!!!!!!!!!!!!!!!!!!!!!!!! '@LABRCNTIP'$ (procalcitonin:4,lacticidven:4) )No results found for this or any previous visit (from the past 240 hour(s)).   Radiological Exams on Admission: US Abdomen Limited RUQ (LIVER/GB)  Result Date: 05/23/2022 CLINICAL DATA:  Abdominal pain. Elevated lipase, AST, ALT and wbc's. EXAM: ULTRASOUND ABDOMEN LIMITED RIGHT UPPER QUADRANT COMPARISON:  CT AP 05/22/2022 FINDINGS: Gallbladder: No gallstones or wall thickening visualized. No sonographic Murphy  sign noted by sonographer. Pericholecystic fluid noted. Common bile duct: Diameter: 4.9 mm. Liver: Marked diffuse increased parenchymal echogenicity compatible with hepatic steatosis. No focal liver abnormality identified. Portal vein is patent on color Doppler imaging with normal direction of blood flow towards the liver. Other: None. IMPRESSION: 1. Pericholecystic fluid noted without gallstones, gallbladder wall thickening or sonographic Murphy's sign. 2. Hepatic steatosis. Electronically Signed   By: Kerby Moors M.D.   On: 05/23/2022 08:16   CT ABDOMEN PELVIS W CONTRAST  Result Date: 05/22/2022 CLINICAL DATA:  Abdominal pain. EXAM: CT ABDOMEN AND PELVIS WITH CONTRAST TECHNIQUE: Multidetector CT imaging of the abdomen and pelvis was performed using the standard protocol  following bolus administration of intravenous contrast. RADIATION DOSE REDUCTION: This exam was performed according to the departmental dose-optimization program which includes automated exposure control, adjustment of the mA and/or kV according to patient size and/or use of iterative reconstruction technique. CONTRAST:  153m OMNIPAQUE IOHEXOL 300 MG/ML  SOLN COMPARISON:  None Available. FINDINGS: Lower chest: The visualized lung bases are clear. No intra-abdominal free air or free fluid. Hepatobiliary: Fatty liver. The gallbladder is distended. There is trace pericholecystic fluid. No calcified gallstone identified. Ultrasound may provide better evaluation of the gallbladder if there is clinical concern for acute cholecystitis. Pancreas: Unremarkable. No pancreatic ductal dilatation or surrounding inflammatory changes. Spleen: Normal in size without focal abnormality. Adrenals/Urinary Tract: The adrenal glands unremarkable. There is no hydronephrosis on either side. There is symmetric enhancement and excretion of contrast by both kidneys. The visualized ureters and urinary bladder appear unremarkable. Stomach/Bowel: There is no bowel  obstruction or active inflammation. The appendix is normal. Vascular/Lymphatic: Mild aortoiliac atherosclerotic disease. The IVC is unremarkable. No portal venous gas. There is no adenopathy. Reproductive: The uterus is anteverted and grossly unremarkable. No adnexal masses. Other: None Musculoskeletal: Mild degenerative changes. No acute osseous pathology. IMPRESSION: 1. Distended gallbladder with trace pericholecystic fluid. No calcified gallstone identified. Ultrasound may provide better evaluation of the gallbladder if there is clinical concern for acute cholecystitis. 2. Fatty liver. 3. No bowel obstruction. Normal appendix. 4. Aortic Atherosclerosis (ICD10-I70.0). Electronically Signed   By: AAnner CreteM.D.   On: 05/22/2022 23:55     All images have been reviewed by me personally.  EKG: Independently reviewed.   Assessment/Plan Principal Problem:   Abdominal pain Active Problems:   Type 2 diabetes mellitus with obesity ([place]   Hy[place]ension associated with diabetes ([place]   Hyperlipidemia associated with type 2 diabetes mellitus ([facility])   GERD (gastroesophageal reflux disease)   Acute pancreatitis   Elevated LFTs   Fatty liver   Overweight   OSA (obstructive sleep apnea)   Assessment Plan  #abdomen Epigastric pain concerning for mild acute pancreatitis and/or cholecystitis #Mildly elevated AST/ALT #Fatty liver on CT scan #GERD  Patient presenting with low-grade fever 99.5 F, WBC 11.6.  On exam mild tenderness to RUQ and epigastric area on exam  -Lipase 117 - mild elevated AST/SLT and normal ALP and T Bili could be related to fatty liver.  - CT abdomen- Distended gallbladder with trace pericholecystic fluid.  - RUQ U/S- Pericholecystic fluid noted without gallstones, gallbladder wall thickening or sonographic Murphy's sign. - general surgery was consulted and recommended to continue medical work-up, okay with clear liquid diet.  - will treat for mild pancreatitis- she received  NS 1L at the ED and will continue IVF - pain management as needed - empiric Abx Zosyn for possible cholecystitis coverage.    # overweight  - BMI 29 - weight loss as outpt per PCP   # T2DM - HGB A1C pencding - Glu AC and HS - hold home oral DM meds  # HTN and HLD - chronic and stable   # OSA CPAP      DVT prophylaxis: Lovenox  Code Status: Full code Family Communication: Son over the phone Consults called: General surgery   Status is: Observation The patient remains OBS appropriate and will d/c before 2 midnights.   Time Spent: 65 minutes.  >50% of the time was devoted to discussing the patients care, assessment, plan and disposition with other care givers along with counseling the patient about the risks and benefits  of treatment.    Charlann Lange MD [facility]  If 7PM-7AM, please contact night-coverage For on call review [email or web address].   05/23/2022, 10:22 AM

## 2022-05-24 DIAGNOSIS — R1013 Epigastric pain: Secondary | ICD-10-CM | POA: Diagnosis not present

## 2022-05-24 LAB — COMPREHENSIVE METABOLIC PANEL
ALT: 147 U/L — ABNORMAL HIGH (ref 0–44)
AST: 96 U/L — ABNORMAL HIGH (ref 15–41)
Albumin: 3.3 g/dL — ABNORMAL LOW (ref 3.5–5.0)
Alkaline Phosphatase: 59 U/L (ref 38–126)
Anion gap: 9 (ref 5–15)
BUN: 9 mg/dL (ref 8–23)
CO2: 26 mmol/L (ref 22–32)
Calcium: 8.5 mg/dL — ABNORMAL LOW (ref 8.9–10.3)
Chloride: 103 mmol/L (ref 98–111)
Creatinine, Ser: 0.76 mg/dL (ref 0.44–1.00)
GFR, Estimated: >60 mL/min (ref >60)
Glucose, Bld: 124 mg/dL — ABNORMAL HIGH (ref 70–99)
Potassium: 3.7 mmol/L (ref 3.5–5.1)
Sodium: 138 mmol/L (ref 135–145)
Total Bilirubin: 0.7 mg/dL (ref 0.3–1.2)
Total Protein: 6 g/dL — ABNORMAL LOW (ref 6.5–8.1)

## 2022-05-24 LAB — HEMOGLOBIN A1C
Hgb A1c MFr Bld: 8.3 % — ABNORMAL HIGH (ref 4.8–5.6)
Mean Plasma Glucose: 191.51 mg/dL

## 2022-05-24 LAB — GLUCOSE, CAPILLARY
Glucose-Capillary: 315 mg/dL — ABNORMAL HIGH (ref 70–99)
Glucose-Capillary: 135 mg/dL — ABNORMAL HIGH (ref 70–99)
Glucose-Capillary: 170 mg/dL — ABNORMAL HIGH (ref 70–99)

## 2022-05-24 LAB — CBC
HCT: 38.8 % (ref 36.0–46.0)
Hemoglobin: 12.8 g/dL (ref 12.0–15.0)
MCH: 29.2 pg (ref 26.0–34.0)
MCHC: 33 g/dL (ref 30.0–36.0)
MCV: 88.6 fL (ref 80.0–100.0)
Platelets: 200 10*3/uL (ref 150–400)
RBC: 4.38 MIL/uL (ref 3.87–5.11)
RDW: 13 % (ref 11.5–15.5)
WBC: 7.3 10*3/uL (ref 4.0–10.5)
nRBC: 0 % (ref 0.0–0.2)

## 2022-05-24 LAB — LIPASE, BLOOD: Lipase: 36 U/L (ref 11–51)

## 2022-05-24 MED ORDER — POLYETHYLENE GLYCOL 3350 17 G PO PACK: 17.0000 g | PACK | Freq: Every day | ORAL | 0 refills | Status: DC | PRN

## 2022-05-24 MED ORDER — POLYETHYLENE GLYCOL 3350 17 G PO PACK: 17.0000 g | PACK | Freq: Every day | ORAL | Status: DC | PRN

## 2022-05-24 MED ORDER — SENNOSIDES-DOCUSATE SODIUM 8.6-50 MG PO TABS: 1.0000 | ORAL_TABLET | Freq: Every evening | ORAL | Status: DC | PRN

## 2022-05-24 NOTE — Plan of Care (Signed)
  Problem: Safety: Goal: Ability to remain free from injury will improve Outcome: Progressing   Problem: Pain Managment: Goal: General experience of comfort will improve Outcome: Progressing   

## 2022-05-24 NOTE — TOC Transition Note (Signed)
Transition of Care [facility]) - CM/SW Discharge Note   Patient Details  Name: Charlene Juarez MRN: [id number] Date of Birth: Jul 20, 1951  [facility]) CM/SW Contact:  Pollie Friar, RN Phone Number: 05/24/2022, 2:36 PM   Clinical Narrative:    Pt is discharging home with self care. No f/u per PT.  Pt has transportation home.   Final next level of care: Home/Self Care Barriers to Discharge: No Barriers Identified   Patient Goals and CMS Choice        Discharge Placement                       Discharge Plan and Services                                     Social Determinants of Health (SDOH) Interventions     Readmission Risk Interventions     No data to display

## 2022-05-24 NOTE — Progress Notes (Signed)
  Transition of Care [facility]) Screening Note   Patient Details  Name: Charlene Juarez Date of Birth: 12-15-1950   Transition of Care [facility]) CM/SW Contact:    Pollie Friar, RN Phone Number: 05/24/2022, 1:16 PM   From home with family--no f/u per PT. Transition of Care Department [facility]) has reviewed patient and no TOC needs have been identified at this time. We will continue to monitor patient advancement through interdisciplinary progression rounds. If new patient transition needs arise, please place a TOC consult.

## 2022-05-24 NOTE — Progress Notes (Signed)
OT Cancellation Note  Patient Details Name: Charlene Juarez MRN: [id number] DOB: 1951/09/03   Cancelled Treatment:    Reason Eval/Treat Not Completed: OT screened, no needs identified, will sign off (Per discussion with PT, pt does not have acute OT needs. Will sign off, thank you.)  Deicy Rusk A Brielynn Sekula 05/24/2022, 11:41 AM

## 2022-05-24 NOTE — Progress Notes (Signed)
Mobility Specialist Progress Note:   05/24/22 0950  Mobility  Activity Ambulated independently in hallway  Level of Assistance Modified independent, requires aide device or extra time  Assistive Device Other (Comment) (IV Pole)  Distance Ambulated (ft) 550 ft  Activity Response Tolerated well  $Mobility charge 1 Mobility   Pt eager for mobility session. No physical assist required. Pt left in chair with all needs met.   Nelta Numbers Acute Rehab Secure Chat or Office Phone: [phone]

## 2022-05-24 NOTE — Discharge Summary (Addendum)
Discharge Summary  Charlene Juarez YIR:[id number] DOB: [birth year]  PCP: Venita Lick, NP  Admit date: 05/22/2022 Discharge date: 06/03/2022  Time spent: 35 minutes.  Recommendations for Outpatient Follow-up:  Follow-up with your primary care provider within a week. Take your medications as prescribed. Recommend to follow a heart healthy carb modified diet. Repeat labs at your primary care provider, [facility], on 05/27/2022, to continue to trend LFTs.  Discharge Diagnoses:  Active Hospital Problems   Diagnosis Date Noted   Abdominal pain 05/23/2022   Acute pancreatitis 05/23/2022   Elevated LFTs 05/23/2022   Fatty liver 05/23/2022   Overweight 05/23/2022   OSA (obstructive sleep apnea) 05/23/2022   Type 2 diabetes mellitus with obesity ([place]) 04/08/2015   Hypertension associated with diabetes ([place]) 04/08/2015   Hyperlipidemia associated with type 2 diabetes mellitus ([place]) 04/08/2015   GERD (gastroesophageal reflux disease) 04/08/2015    Resolved Hospital Problems  No resolved problems to display.    Discharge Condition: Stable.  Diet recommendation: Heart healthy carb modified diet.  Vitals:   05/24/22 0841 05/24/22 1642  BP:  128/66  Pulse:  74  Resp:  18  Temp:  97.8 F (36.6 C)  SpO2: 95% 97%    History of present illness:  Charlene Juarez is a 71 y.o. female with medical history significant of T2DM, GERD, HTN, HLD, OSA, H. pylori gastritis seen on EGD done on 01/08/2022, who presented with epigastric pain.  Associated with chronic intermittent epigastric pain usually after meals for several months.  The patient reports that she completed a 14-day course of treatment for H. pylori about 2 months ago.  Also endorses recently being taken off Ozempic due to undesired side effects, and started on Mounjaro by her primary care provider.  The patient presented initially to [facility] ED for further evaluation.  Work-up in the ED was concerning for possible  mild acute cholecystitis seen on CT scan and possible acute pancreatitis with epigastric pain and mildly elevated lipase level.  The patient was started on IV fluid, IV pain medicine and IV antibiotics.  General surgery was consulted and she was transferred to [facility] for further management of her symptomatology.   Upon further assessment, repeat lipase level was normal.  LFTs down trended.  Abdominal ultrasound showed pericholecystic fluid without gallbladder wall thickening or gallstones.  The patient was seen by general surgery.  No need for prophylactic cholecystectomy since no gallstones on imaging, signed off.  At the time of the visit, the patient tolerated a solid diet, she denies any abdominal pain or nausea.  She is eager to go home.  The patient was advised to hold off Mounjaro due to possible side effect which could include acute gallbladder disease and acute pancreatitis.  05/24/2022: The patient was seen and examined at her bedside.  She has no new complaints.  She is tolerating her diet well.  She had a bowel movement.  States she feels good and wants to go home.    Hospital Course:  Principal Problem:   Abdominal pain Active Problems:   Type 2 diabetes mellitus with obesity ([place])   Hypertension associated with diabetes ([place])   Hyperlipidemia associated with type 2 diabetes mellitus ([facility])   GERD (gastroesophageal reflux disease)   Acute pancreatitis   Elevated LFTs   Fatty liver   Overweight   OSA (obstructive sleep apnea)  Resolved epigastric pain  Resolved mild acute pancreatitis Lipase mildly elevated on admission 117 with epigastric pain. No evidence of acute  pancreatitis on CT scan. Repeated lipase level had normalized 36 Patient on Mounjaro, advised to hold off and have a close follow-up appointment with her primary care provider.  Ruled out acute cholecystitis, seen on CT scan Abdominal ultrasound inconclusive, showed pericholecystic fluid without  gallbladder wall thickening or gallstones.  The patient was seen by general surgery.  No need for prophylactic cholecystectomy since no gallstones on imaging, signed off.  Elevated liver chemistries, downtrending Hepatic steatosis seen on CT scan LFTs are downtrending. Alkaline phosphatase and T. bili within normal limits. Recommend weight loss outpatient with regular physical activity and healthy diet.  Fatty liver, seen on CT scan Weight loss outpatient as stated above  GERD Resume home PPI.  Type 2 diabetes with hyperglycemia. Hemoglobin A1c 8.3 on 05/24/2022 from 8.1 on 07/19/2018. Hold off home Mounjaro due to possible side effects. Resume home metformin and Jardiance. Follow-up with your primary care provider within a week.     Procedures: None.  Consultations: General surgery.  Discharge Exam: BP 128/66 (BP Location: Left Arm)   Pulse 74   Temp 97.8 F (36.6 C) (Oral)   Resp 18   Ht 5' 2.01" (1.575 m)   Wt 72.9 kg   LMP 07/13/2000 (Approximate)   SpO2 97%   BMI 29.39 kg/m  General: 71 y.o. year-old female well developed well nourished in no acute distress.  Alert and oriented x3. Cardiovascular: Regular rate and rhythm with no rubs or gallops.  No thyromegaly or JVD noted.   Respiratory: Clear to auscultation with no wheezes or rales. Good inspiratory effort. Abdomen: Soft nontender nondistended with normal bowel sounds x4 quadrants. Musculoskeletal: No lower extremity edema. 2/4 pulses in all 4 extremities. Skin: No ulcerative lesions noted or rashes, Psychiatry: Mood is appropriate for condition and setting  Discharge Instructions You were cared for by a hospitalist during your hospital stay. If you have any questions about your discharge medications or the care you received while you were in the hospital after you are discharged, you can call the unit and asked to speak with the hospitalist on call if the hospitalist that took care of you is not available.  Once you are discharged, your primary care physician will handle any further medical issues. Please note that NO REFILLS for any discharge medications will be authorized once you are discharged, as it is imperative that you return to your primary care physician (or establish a relationship with a primary care physician if you do not have one) for your aftercare needs so that they can reassess your need for medications and monitor your lab values.   Allergies as of 05/24/2022       Reactions   Codeine Sulfate Diarrhea, Nausea And Vomiting   Lisinopril Cough        Medication List     STOP taking these medications    Mounjaro 7.5 MG/0.5ML Pen Generic drug: tirzepatide       TAKE these medications    albuterol 108 (90 Base) MCG/ACT inhaler Commonly known as: Ventolin HFA TAKE 2 PUFFS BY MOUTH EVERY 6 HOURS AS NEEDED FOR WHEEZE OR SHORTNESS OF BREATH What changed:  how much to take how to take this when to take this reasons to take this   Contour Test test strip Generic drug: glucose blood USE TO TEST BLOOD SUGAR ONCE A DAY == DX E11.69   Fish Oil 1200 MG Caps Take 1,200 mg by mouth daily.   gabapentin 100 MG capsule Commonly known as: NEURONTIN Take  1 capsule (100 mg total) by mouth 3 (three) times daily. What changed: when to take this   Ginkgo Biloba 40 MG Tabs Take 1 tablet by mouth daily.   hydrochlorothiazide 25 MG tablet Commonly known as: HYDRODIURIL TAKE 1 TABLET (25 MG TOTAL) BY MOUTH DAILY. What changed: how much to take   Magnesium 500 MG Tabs Take 500 mg by mouth at bedtime.   polyethylene glycol 17 g packet Commonly known as: MIRALAX / GLYCOLAX Take 17 g by mouth daily as needed for moderate constipation.   Symbicort 160-4.5 MCG/ACT inhaler Generic drug: budesonide-formoterol TAKE 2 PUFFS BY MOUTH TWICE A DAY What changed: See the new instructions.   vitamin C 1000 MG tablet Take 1,000 mg by mouth daily.   Vitamin D3 25 MCG (1000 UT)  Caps Take 1,000 Units by mouth at bedtime.       Allergies  Allergen Reactions   Mounjaro [Tirzepatide] Nausea And Vomiting   Ozempic (0.25 Or 0.5 Mg-Dose) [Semaglutide(0.25 Or 0.'5mg'$ -Dos)] Nausea And Vomiting   Codeine Sulfate Diarrhea and Nausea And Vomiting   Lisinopril Cough    Follow-up Information     Venita Lick, NP. Call today.   Specialty: Nurse Practitioner Why: Please call for a posthospital follow-up appointment. Contact information: [place] [address] [phone]                  The results of significant diagnostics from this hospitalization (including imaging, microbiology, ancillary and laboratory) are listed below for reference.    Significant Diagnostic Studies: US Abdomen Limited RUQ (LIVER/GB)  Result Date: 05/23/2022 CLINICAL DATA:  Abdominal pain. Elevated lipase, AST, ALT and wbc's. EXAM: ULTRASOUND ABDOMEN LIMITED RIGHT UPPER QUADRANT COMPARISON:  CT AP 05/22/2022 FINDINGS: Gallbladder: No gallstones or wall thickening visualized. No sonographic Murphy sign noted by sonographer. Pericholecystic fluid noted. Common bile duct: Diameter: 4.9 mm. Liver: Marked diffuse increased parenchymal echogenicity compatible with hepatic steatosis. No focal liver abnormality identified. Portal vein is patent on color Doppler imaging with normal direction of blood flow towards the liver. Other: None. IMPRESSION: 1. Pericholecystic fluid noted without gallstones, gallbladder wall thickening or sonographic Murphy's sign. 2. Hepatic steatosis. Electronically Signed   By: Kerby Moors M.D.   On: 05/23/2022 08:16   CT ABDOMEN PELVIS W CONTRAST  Result Date: 05/22/2022 CLINICAL DATA:  Abdominal pain. EXAM: CT ABDOMEN AND PELVIS WITH CONTRAST TECHNIQUE: Multidetector CT imaging of the abdomen and pelvis was performed using the standard protocol following bolus administration of intravenous contrast. RADIATION DOSE REDUCTION: This exam was performed  according to the departmental dose-optimization program which includes automated exposure control, adjustment of the mA and/or kV according to patient size and/or use of iterative reconstruction technique. CONTRAST:  120m OMNIPAQUE IOHEXOL 300 MG/ML  SOLN COMPARISON:  None Available. FINDINGS: Lower chest: The visualized lung bases are clear. No intra-abdominal free air or free fluid. Hepatobiliary: Fatty liver. The gallbladder is distended. There is trace pericholecystic fluid. No calcified gallstone identified. Ultrasound may provide better evaluation of the gallbladder if there is clinical concern for acute cholecystitis. Pancreas: Unremarkable. No pancreatic ductal dilatation or surrounding inflammatory changes. Spleen: Normal in size without focal abnormality. Adrenals/Urinary Tract: The adrenal glands unremarkable. There is no hydronephrosis on either side. There is symmetric enhancement and excretion of contrast by both kidneys. The visualized ureters and urinary bladder appear unremarkable. Stomach/Bowel: There is no bowel obstruction or active inflammation. The appendix is normal. Vascular/Lymphatic: Mild aortoiliac atherosclerotic disease. The IVC is unremarkable.  No portal venous gas. There is no adenopathy. Reproductive: The uterus is anteverted and grossly unremarkable. No adnexal masses. Other: None Musculoskeletal: Mild degenerative changes. No acute osseous pathology. IMPRESSION: 1. Distended gallbladder with trace pericholecystic fluid. No calcified gallstone identified. Ultrasound may provide better evaluation of the gallbladder if there is clinical concern for acute cholecystitis. 2. Fatty liver. 3. No bowel obstruction. Normal appendix. 4. Aortic Atherosclerosis (ICD10-I70.0). Electronically Signed   By: Anner Crete M.D.   On: 05/22/2022 23:55    Microbiology: No results found for this or any previous visit (from the past 240 hour(s)).   Labs: Basic Metabolic Panel: No results for  input(s): "NA", "K", "CL", "CO2", "GLUCOSE", "BUN", "CREATININE", "CALCIUM", "MG", "PHOS" in the last 168 hours.  Liver Function Tests: No results for input(s): "AST", "ALT", "ALKPHOS", "BILITOT", "PROT", "ALBUMIN" in the last 168 hours.  No results for input(s): "LIPASE", "AMYLASE" in the last 168 hours.  No results for input(s): "AMMONIA" in the last 168 hours. CBC: No results for input(s): "WBC", "NEUTROABS", "HGB", "HCT", "MCV", "PLT" in the last 168 hours.  Cardiac Enzymes: No results for input(s): "CKTOTAL", "CKMB", "CKMBINDEX", "TROPONINI" in the last 168 hours. BNP: BNP (last 3 results) No results for input(s): "BNP" in the last 8760 hours.  ProBNP (last 3 results) No results for input(s): "PROBNP" in the last 8760 hours.  CBG: No results for input(s): "GLUCAP" in the last 168 hours.      Signed:  Kayleen Memos, MD [facility] 06/03/2022, 4:10 PM

## 2022-05-24 NOTE — Progress Notes (Signed)
Abdominal pain  Subjective: Feeling better  Objective: Vital signs in last 24 hours: Temp:  [97.6 F (36.4 C)-98.9 F (37.2 C)] 98.4 F (36.9 C) (09/04 0745) Pulse Rate:  [78-89] 81 (09/04 0745) Resp:  [17-18] 17 (09/04 0745) BP: (81-112)/(38-59) 112/59 (09/04 0745) SpO2:  [95 %-98 %] 95 % (09/04 0841) Last BM Date : 05/21/22  Intake/Output from previous day: 09/03 0701 - 09/04 0700 In: 2081.7 [P.O.:200; I.V.:1781.7; IV Piggyback:100] Out: -  Intake/Output this shift: Total I/O In: 1046 [P.O.:1046] Out: -   General appearance: alert and cooperative GI: less TTP  Lab Results:  Results for orders placed or performed during the hospital encounter of 05/22/22 (from the past 24 hour(s))  Glucose, capillary     Status: Abnormal   Collection Time: 05/23/22 12:03 PM  Result Value Ref Range   Glucose-Capillary 115 (H) 70 - 99 mg/dL  Glucose, capillary     Status: Abnormal   Collection Time: 05/23/22  4:02 PM  Result Value Ref Range   Glucose-Capillary 242 (H) 70 - 99 mg/dL  Glucose, capillary     Status: Abnormal   Collection Time: 05/23/22  7:27 PM  Result Value Ref Range   Glucose-Capillary 150 (H) 70 - 99 mg/dL  CBC     Status: None   Collection Time: 05/24/22 12:23 AM  Result Value Ref Range   WBC 7.3 4.0 - 10.5 K/uL   RBC 4.38 3.87 - 5.11 MIL/uL   Hemoglobin 12.8 12.0 - 15.0 g/dL   HCT 38.8 36.0 - 46.0 %   MCV 88.6 80.0 - 100.0 fL   MCH 29.2 26.0 - 34.0 pg   MCHC 33.0 30.0 - 36.0 g/dL   RDW 13.0 11.5 - 15.5 %   Platelets 200 150 - 400 K/uL   nRBC 0.0 0.0 - 0.2 %  Comprehensive metabolic panel     Status: Abnormal   Collection Time: 05/24/22 12:23 AM  Result Value Ref Range   Sodium 138 135 - 145 mmol/L   Potassium 3.7 3.5 - 5.1 mmol/L   Chloride 103 98 - 111 mmol/L   CO2 26 22 - 32 mmol/L   Glucose, Bld 124 (H) 70 - 99 mg/dL   BUN 9 8 - 23 mg/dL   Creatinine, Ser 0.76 0.44 - 1.00 mg/dL   Calcium 8.5 (L) 8.9 - 10.3 mg/dL   Total Protein 6.0 (L) 6.5 - 8.1  g/dL   Albumin 3.3 (L) 3.5 - 5.0 g/dL   AST 96 (H) 15 - 41 U/L   ALT 147 (H) 0 - 44 U/L   Alkaline Phosphatase 59 38 - 126 U/L   Total Bilirubin 0.7 0.3 - 1.2 mg/dL   GFR, Estimated >60 >60 mL/min   Anion gap 9 5 - 15  Hemoglobin A1c     Status: Abnormal   Collection Time: 05/24/22 12:23 AM  Result Value Ref Range   Hgb A1c MFr Bld 8.3 (H) 4.8 - 5.6 %   Mean Plasma Glucose 191.51 mg/dL  Lipase, blood     Status: None   Collection Time: 05/24/22 12:23 AM  Result Value Ref Range   Lipase 36 11 - 51 U/L  Glucose, capillary     Status: Abnormal   Collection Time: 05/24/22  7:51 AM  Result Value Ref Range   Glucose-Capillary 315 (H) 70 - 99 mg/dL     Studies/Results Radiology     MEDS, Scheduled  enoxaparin (LOVENOX) injection  40 mg Subcutaneous Q24H   gabapentin  100  mg Oral TID   insulin aspart  0-6 Units Subcutaneous TID WC   irbesartan  300 mg Oral Daily   mometasone-formoterol  2 puff Inhalation BID   pantoprazole  20 mg Oral BID   rosuvastatin  20 mg Oral Daily     Assessment: Abdominal pain Pancreatitis   Plan: Pt with resolving mild pancreatitis with unclear etiology.  Prophylactic cholecystectomy not recommended given no stones on imaging.  Will sign off.  Please call us if anything changes  LOS: 1 day    Rosario Adie, MD [facility]  Patient's medical decision making was straightforward (25 mins met or exceeded with patient care and documentation).   05/24/2022 9:34 AM

## 2022-05-24 NOTE — Evaluation (Signed)
Physical Therapy Evaluation Patient Details Name: Charlene Juarez MRN: [id number] DOB: 10-17-1950 Today's Date: 05/24/2022  History of Present Illness  Pt is a 71 y/o female admitted secondary to abdominal pain. Likely from pancreatitis. PMH includes DM, HTN, and skin cancer.  Clinical Impression  Patient evaluated by Physical Therapy with no further acute PT needs identified. All education has been completed and the patient has no further questions. Pt overall at a mod I to independent level with all mobility tasks. Scored 22 on DGI indicating low fall risk. Educated about continuing working with mobility specialists while admitted to prevent deconditioning. See below for any follow-up Physical Therapy or equipment needs. PT is signing off. Thank you for this referral. If needs change, please re-consult.         Recommendations for follow up therapy are one component of a multi-disciplinary discharge planning process, led by the attending physician.  Recommendations may be updated based on patient status, additional functional criteria and insurance authorization.  Follow Up Recommendations No PT follow up      Assistance Recommended at Discharge PRN  Patient can return home with the following  Assistance with cooking/housework    Equipment Recommendations None recommended by PT  Recommendations for Other Services       Functional Status Assessment Patient has not had a recent decline in their functional status     Precautions / Restrictions Precautions Precautions: Fall Restrictions Weight Bearing Restrictions: No      Mobility  Bed Mobility               General bed mobility comments: sitting EOB upon entry    Transfers Overall transfer level: Independent                      Ambulation/Gait Ambulation/Gait assistance: Independent Gait Distance (Feet): 150 Feet Assistive device: None Gait Pattern/deviations: WFL(Within Functional  Limits) Gait velocity: WFL     General Gait Details: Able to perform DGI tasks without LOB. Good gait speed  Stairs Stairs: Yes Stairs assistance: Modified independent (Device/Increase time) Stair Management: Step to pattern, Forwards Number of Stairs: 2 General stair comments: overall steady stair navigation. No LOB noted.  Wheelchair Mobility    Modified Rankin (Stroke Patients Only)       Balance Overall balance assessment: Independent                               Standardized Balance Assessment Standardized Balance Assessment : Dynamic Gait Index   Dynamic Gait Index Level Surface: Normal Change in Gait Speed: Normal Gait with Horizontal Head Turns: Normal Gait with Vertical Head Turns: Normal Gait and Pivot Turn: Mild Impairment Step Over Obstacle: Normal Step Around Obstacles: Normal Steps: Mild Impairment Total Score: 22       Pertinent Vitals/Pain Pain Assessment Pain Assessment: No/denies pain    Home Living Family/patient expects to be discharged to:: Private residence Living Arrangements: Children Available Help at Discharge: Family;Available PRN/intermittently Type of Home: House Home Access: Stairs to enter Entrance Stairs-Rails: None Entrance Stairs-Number of Steps: 2   Home Layout: One level Home Equipment: None      Prior Function Prior Level of Function : Independent/Modified Independent;Driving                     Hand Dominance        Extremity/Trunk Assessment   Upper Extremity Assessment Upper Extremity Assessment: Overall  WFL for tasks assessed    Lower Extremity Assessment Lower Extremity Assessment: Overall WFL for tasks assessed    Cervical / Trunk Assessment Cervical / Trunk Assessment: Normal  Communication   Communication: No difficulties  Cognition Arousal/Alertness: Awake/alert Behavior During Therapy: WFL for tasks assessed/performed Overall Cognitive Status: Within Functional Limits  for tasks assessed                                          General Comments      Exercises     Assessment/Plan    PT Assessment Patient does not need any further PT services  PT Problem List         PT Treatment Interventions      PT Goals (Current goals can be found in the Care Plan section)  Acute Rehab PT Goals Patient Stated Goal: to go home PT Goal Formulation: With patient Time For Goal Achievement: 05/24/22 Potential to Achieve Goals: Good    Frequency       Co-evaluation               AM-PAC PT "6 Clicks" Mobility  Outcome Measure Help needed turning from your back to your side while in a flat bed without using bedrails?: None Help needed moving from lying on your back to sitting on the side of a flat bed without using bedrails?: None Help needed moving to and from a bed to a chair (including a wheelchair)?: None Help needed standing up from a chair using your arms (e.g., wheelchair or bedside chair)?: None Help needed to walk in hospital room?: None Help needed climbing 3-5 steps with a railing? : None 6 Click Score: 24    End of Session Equipment Utilized During Treatment: Gait belt Activity Tolerance: Patient tolerated treatment well Patient left: in bed;with call bell/phone within reach Nurse Communication: Mobility status PT Visit Diagnosis: Other abnormalities of gait and mobility (R26.89)    Time: 1683-7290 PT Time Calculation (min) (ACUTE ONLY): 10 min   Charges:   PT Evaluation $PT Eval Low Complexity: 1 Low          Lou Miner, DPT  Acute Rehabilitation Services  Office: [phone]   Rudean Hitt 05/24/2022, 12:02 PM

## 2022-05-25 ENCOUNTER — Other Ambulatory Visit: Payer: Self-pay | Admitting: Nurse Practitioner

## 2022-05-25 ENCOUNTER — Telehealth: Payer: Self-pay | Admitting: *Deleted

## 2022-05-25 NOTE — Telephone Encounter (Signed)
Transition Care Management Unsuccessful Follow-up Telephone Call  Date of discharge and from where:  [facility]  Attempts:  1st Attempt  Reason for unsuccessful TCM follow-up call:  Left voice message

## 2022-05-26 ENCOUNTER — Other Ambulatory Visit: Payer: Self-pay | Admitting: Nurse Practitioner

## 2022-05-26 ENCOUNTER — Encounter: Payer: Self-pay | Admitting: Nurse Practitioner

## 2022-05-26 ENCOUNTER — Ambulatory Visit (INDEPENDENT_AMBULATORY_CARE_PROVIDER_SITE_OTHER): Payer: Medicare Other | Admitting: Nurse Practitioner

## 2022-05-26 ENCOUNTER — Other Ambulatory Visit: Payer: Self-pay

## 2022-05-26 VITALS — BP 120/69 | HR 73 | Temp 97.7°F | Ht 62.01 in | Wt 160.4 lb

## 2022-05-26 DIAGNOSIS — E669 Obesity, unspecified: Secondary | ICD-10-CM

## 2022-05-26 DIAGNOSIS — K853 Drug induced acute pancreatitis without necrosis or infection: Secondary | ICD-10-CM | POA: Diagnosis not present

## 2022-05-26 DIAGNOSIS — E1169 Type 2 diabetes mellitus with other specified complication: Secondary | ICD-10-CM

## 2022-05-26 MED ORDER — CARBOXYMETHYLCELLULOSE SODIUM 0.5 % OP SOLN
1.0000 [drp] | Freq: Every day | OPHTHALMIC | 4 refills | Status: DC | PRN
  Filled 2022-05-26: qty 30, fill #0

## 2022-05-26 MED ORDER — GLIPIZIDE 5 MG PO TABS: 5.0000 mg | ORAL_TABLET | Freq: Every day | ORAL | 4 refills | Status: DC

## 2022-05-26 NOTE — Assessment & Plan Note (Signed)
Chronic, ongoing.  Urine ALB 30 October 2021.  A1c 8.3% which is increased from previous 7.5%, pancreatitis presented with Mounjaro.  Had tolerated Ozempic well for years.  At this time will maintain off GLP1 or Mounjaro due to side effects.  Continue Jardiance and Metformin + will add on low dose Glipizide 5 MG daily, however discussed with patient to monitor sugars closely with this.  Would prefer to avoid insulin unless needed.  Check blood sugar 3 times a day and document for provider.

## 2022-05-26 NOTE — Progress Notes (Signed)
BP 120/69   Pulse 73   Temp 97.7 F (36.5 C) (Oral)   Ht 5' 2.01" (1.575 m)   Wt 160 lb 6.4 oz (72.8 kg)   LMP 07/13/2000 (Approximate)   SpO2 98%   BMI 29.33 kg/m    Subjective:    Patient ID: Charlene Juarez, female    DOB: [birth year], 71 y.o.   MRN: [id number]  HPI: Charlene Juarez is a 72 y.o. female  Chief Complaint  Patient presents with   Abdominal Pain    Patient is here for a Hospitalization Follow Up on Abdominal Pain. Patient says she is feeling a lot better, says she had to stopped taking Mounjaro and Ozempic, as they were the cause to her discomfort.    Transition of Care Hospital Follow up.  Was admitted to [facility] on 05/22/22 and discharged on 05/24/22.  She is requesting insulin or Glipizide as feels she could take this as needed and it would offer benefit.  Discussed at length with her.  Recent A1c 8.3%.  Currently taking Jardiance and Metformin.  Recent pancreatitis with Ozempic and Mounjaro.    Sugar this morning was 93 -- on average before she got sick sugar was 260 to 270 in morning.  She was placed on clear fluid diet in hospital due to pancreatitis and this brought sugars down.  Overall reports feeling better since stopping medications.  "History of present illness:  Charlene Juarez is a 71 y.o. female with medical history significant of T2DM, GERD, HTN, HLD, OSA, H. pylori gastritis seen on EGD done on 01/08/2022, who presented with epigastric pain.  Associated with chronic intermittent epigastric pain usually after meals for several months.  The patient reports that she completed a 14-day course of treatment for H. pylori about 2 months ago.  Also endorses recently being taken off Ozempic due to undesired side effects, and started on Mounjaro by her primary care provider.   The patient presented initially to [facility] ED for further evaluation.  Work-up in the ED was concerning for possible mild acute cholecystitis seen on CT scan and  possible acute pancreatitis with epigastric pain and mildly elevated lipase level.  The patient was started on IV fluid, IV pain medicine and IV antibiotics.  General surgery was consulted and she was transferred to [facility] for further management of her symptomatology.   Upon further assessment, repeat lipase level was normal.  LFTs down trended. Abdominal ultrasound showed pericholecystic fluid without gallbladder wall thickening or gallstones.  The patient was seen by general surgery.  No need for prophylactic cholecystectomy since no gallstones on imaging, signed off.   At the time of the visit, the patient tolerated a solid diet, she denies any abdominal pain or nausea.  She is eager to go home.  The patient was advised to hold off Mounjaro due to possible side effect which could include acute gallbladder disease and acute pancreatitis.   05/24/2022: The patient was seen and examined at her bedside.  She has no new complaints.  She is tolerating her diet well.  She had a bowel movement.  States she feels good and wants to go home."  Hospital/Facility: [facility] D/C Physician: Dr. Nevada Crane D/C Date: 05/24/22  Records Requested: 05/26/22 Records Received: 05/26/22 Records Reviewed: 05/26/22  Diagnoses on Discharge:   Date of interactive Contact within 48 hours of discharge:  Contact was through: phone  Date of 7 day or 14 day face-to-face visit:    within 7  days  Outpatient Encounter Medications as of 05/26/2022  Medication Sig   albuterol (VENTOLIN HFA) 108 (90 Base) MCG/ACT inhaler TAKE 2 PUFFS BY MOUTH EVERY 6 HOURS AS NEEDED FOR WHEEZE OR SHORTNESS OF BREATH (Patient taking differently: Inhale 2 puffs into the lungs every 6 (six) hours as needed for wheezing or shortness of breath. TAKE 2 PUFFS BY MOUTH EVERY 6 HOURS AS NEEDED FOR WHEEZE OR SHORTNESS OF BREATH)   Ascorbic Acid (VITAMIN C) 1000 MG tablet Take 1,000 mg by mouth daily.   carboxymethylcellulose (REFRESH PLUS) 0.5 % SOLN  Place 1 drop into both eyes daily as needed (dry eyes).   Cholecalciferol (VITAMIN D3) 1000 units CAPS Take 1,000 Units by mouth at bedtime.   gabapentin (NEURONTIN) 100 MG capsule Take 1 capsule (100 mg total) by mouth 3 (three) times daily. (Patient taking differently: Take 100 mg by mouth at bedtime.)   Ginkgo Biloba 40 MG TABS Take 1 tablet by mouth daily.   glipiZIDE (GLUCOTROL) 5 MG tablet Take 1 tablet (5 mg total) by mouth daily before breakfast.   glucose blood (CONTOUR TEST) test strip USE TO TEST BLOOD SUGAR ONCE A DAY == DX E11.69   hydrochlorothiazide (HYDRODIURIL) 25 MG tablet TAKE 1 TABLET (25 MG TOTAL) BY MOUTH DAILY. (Patient taking differently: Take 25 mg by mouth daily.)   Magnesium 500 MG TABS Take 500 mg by mouth at bedtime.   metFORMIN (GLUCOPHAGE) 1000 MG tablet Take 1 tablet (1,000 mg total) by mouth 2 (two) times daily with a meal.   Omega-3 Fatty Acids (FISH OIL) 1200 MG CAPS Take 1,200 mg by mouth daily.   pantoprazole (PROTONIX) 20 MG tablet Take 1 tablet (20 mg total) by mouth 2 (two) times daily.   polyethylene glycol (MIRALAX / GLYCOLAX) 17 g packet Take 17 g by mouth daily as needed for moderate constipation.   rosuvastatin (CRESTOR) 20 MG tablet Take 1 tablet (20 mg total) by mouth daily.   SYMBICORT 160-4.5 MCG/ACT inhaler TAKE 2 PUFFS BY MOUTH TWICE A DAY (Patient taking differently: Inhale 2 puffs into the lungs 2 (two) times daily as needed (shortness of breath).)   telmisartan (MICARDIS) 80 MG tablet Take 1 tablet (80 mg total) by mouth daily.   [DISCONTINUED] empagliflozin (JARDIANCE) 25 MG TABS tablet Take 1 tablet (25 mg total) by mouth daily.   [DISCONTINUED] carboxymethylcellulose (REFRESH PLUS) 0.5 % SOLN Place 1 drop into both eyes daily as needed (dry eyes).   No facility-administered encounter medications on file as of 05/26/2022.    Diagnostic Tests Reviewed/Disposition: reviewed on chart  Consults: GI  Discharge Instructions: Follow-up with PCP  and repeat labs  Disease/illness Education: Discussed at length with patient  Home Health/Community Services Discussions/Referrals: none  Establishment or re-establishment of referral orders for community resources: none  Discussion with other health care providers: Reviewed all recent notes  Assessment and Support of treatment regimen adherence: Discussed at length with patient  Appointments Coordinated with: Discussed at length with patient  Education for self-management, independent living, and ADLs:  Discussed at length with patient  Relevant past medical, surgical, family and social history reviewed and updated as indicated. Interim medical history since our last visit reviewed. Allergies and medications reviewed and updated.  Review of Systems  Constitutional:  Negative for activity change, appetite change, diaphoresis, fatigue and fever.  Respiratory:  Negative for cough, chest tightness, shortness of breath and wheezing.   Cardiovascular:  Negative for chest pain, palpitations and leg swelling.  Gastrointestinal: Negative.  Endocrine: Negative for polydipsia, polyphagia and polyuria.  Neurological: Negative.   Psychiatric/Behavioral: Negative.      Per HPI unless specifically indicated above     Objective:    BP 120/69   Pulse 73   Temp 97.7 F (36.5 C) (Oral)   Ht 5' 2.01" (1.575 m)   Wt 160 lb 6.4 oz (72.8 kg)   LMP 07/13/2000 (Approximate)   SpO2 98%   BMI 29.33 kg/m   Wt Readings from Last 3 Encounters:  05/26/22 160 lb 6.4 oz (72.8 kg)  05/23/22 160 lb 11.5 oz (72.9 kg)  03/05/22 165 lb 3.2 oz (74.9 kg)    Physical Exam Vitals and nursing note reviewed.  Constitutional:      General: She is awake. She is not in acute distress.    Appearance: She is well-developed and well-groomed. She is obese. She is not ill-appearing or toxic-appearing.  HENT:     Head: Normocephalic.  Eyes:     General: Lids are normal. No scleral icterus.    Pupils: Pupils  are equal, round, and reactive to light.  Neck:     Vascular: No carotid bruit.  Cardiovascular:     Rate and Rhythm: Normal rate and regular rhythm.     Pulses: Normal pulses.     Heart sounds: Normal heart sounds. No murmur heard.    No gallop.  Pulmonary:     Effort: Pulmonary effort is normal. No accessory muscle usage or respiratory distress.     Breath sounds: Normal breath sounds.  Abdominal:     General: Bowel sounds are normal. There is no distension.     Palpations: Abdomen is soft.     Tenderness: There is no abdominal tenderness.  Musculoskeletal:     Cervical back: Full passive range of motion without pain.     Right lower leg: No edema.     Left lower leg: No edema.  Skin:    General: Skin is warm and dry.  Neurological:     Mental Status: She is alert.  Psychiatric:        Attention and Perception: Attention normal.        Mood and Affect: Mood normal.        Speech: Speech normal.        Behavior: Behavior is cooperative.        Thought Content: Thought content normal.     Results for orders placed or performed during the hospital encounter of 05/22/22  Comprehensive metabolic panel  Result Value Ref Range   Sodium 137 135 - 145 mmol/L   Potassium 3.8 3.5 - 5.1 mmol/L   Chloride 98 98 - 111 mmol/L   CO2 28 22 - 32 mmol/L   Glucose, Bld 146 (H) 70 - 99 mg/dL   BUN 21 8 - 23 mg/dL   Creatinine, Ser 0.80 0.44 - 1.00 mg/dL   Calcium 8.9 8.9 - 10.3 mg/dL   Total Protein 6.5 6.5 - 8.1 g/dL   Albumin 4.3 3.5 - 5.0 g/dL   AST 108 (H) 15 - 41 U/L   ALT 99 (H) 0 - 44 U/L   Alkaline Phosphatase 51 38 - 126 U/L   Total Bilirubin 0.8 0.3 - 1.2 mg/dL   GFR, Estimated >60 >60 mL/min   Anion gap 11 5 - 15  Lipase, blood  Result Value Ref Range   Lipase 117 (H) 11 - 51 U/L  CBC with Differential  Result Value Ref Range   WBC 11.6 (  H) 4.0 - 10.5 K/uL   RBC 4.64 3.87 - 5.11 MIL/uL   Hemoglobin 13.6 12.0 - 15.0 g/dL   HCT 40.6 36.0 - 46.0 %   MCV 87.5 80.0 -  100.0 fL   MCH 29.3 26.0 - 34.0 pg   MCHC 33.5 30.0 - 36.0 g/dL   RDW 13.0 11.5 - 15.5 %   Platelets 197 150 - 400 K/uL   nRBC 0.0 0.0 - 0.2 %   Neutrophils Relative % 78 %   Neutro Abs 9.0 (H) 1.7 - 7.7 K/uL   Lymphocytes Relative 16 %   Lymphs Abs 1.8 0.7 - 4.0 K/uL   Monocytes Relative 5 %   Monocytes Absolute 0.6 0.1 - 1.0 K/uL   Eosinophils Relative 1 %   Eosinophils Absolute 0.1 0.0 - 0.5 K/uL   Basophils Relative 0 %   Basophils Absolute 0.0 0.0 - 0.1 K/uL   Immature Granulocytes 0 %   Abs Immature Granulocytes 0.03 0.00 - 0.07 K/uL  Glucose, capillary  Result Value Ref Range   Glucose-Capillary 152 (H) 70 - 99 mg/dL  CBC with Differential/Platelet  Result Value Ref Range   WBC 9.0 4.0 - 10.5 K/uL   RBC 4.45 3.87 - 5.11 MIL/uL   Hemoglobin 13.2 12.0 - 15.0 g/dL   HCT 39.4 36.0 - 46.0 %   MCV 88.5 80.0 - 100.0 fL   MCH 29.7 26.0 - 34.0 pg   MCHC 33.5 30.0 - 36.0 g/dL   RDW 13.0 11.5 - 15.5 %   Platelets 176 150 - 400 K/uL   nRBC 0.0 0.0 - 0.2 %   Neutrophils Relative % 77 %   Neutro Abs 6.9 1.7 - 7.7 K/uL   Lymphocytes Relative 15 %   Lymphs Abs 1.4 0.7 - 4.0 K/uL   Monocytes Relative 8 %   Monocytes Absolute 0.7 0.1 - 1.0 K/uL   Eosinophils Relative 0 %   Eosinophils Absolute 0.0 0.0 - 0.5 K/uL   Basophils Relative 0 %   Basophils Absolute 0.0 0.0 - 0.1 K/uL   Immature Granulocytes 0 %   Abs Immature Granulocytes 0.03 0.00 - 0.07 K/uL  Comprehensive metabolic panel  Result Value Ref Range   Sodium 136 135 - 145 mmol/L   Potassium 3.8 3.5 - 5.1 mmol/L   Chloride 104 98 - 111 mmol/L   CO2 25 22 - 32 mmol/L   Glucose, Bld 142 (H) 70 - 99 mg/dL   BUN 14 8 - 23 mg/dL   Creatinine, Ser 0.76 0.44 - 1.00 mg/dL   Calcium 8.6 (L) 8.9 - 10.3 mg/dL   Total Protein 5.8 (L) 6.5 - 8.1 g/dL   Albumin 3.4 (L) 3.5 - 5.0 g/dL   AST 135 (H) 15 - 41 U/L   ALT 165 (H) 0 - 44 U/L   Alkaline Phosphatase 57 38 - 126 U/L   Total Bilirubin 0.8 0.3 - 1.2 mg/dL   GFR, Estimated  >60 >60 mL/min   Anion gap 7 5 - 15  Lipase, blood  Result Value Ref Range   Lipase 61 (H) 11 - 51 U/L  Glucose, capillary  Result Value Ref Range   Glucose-Capillary 115 (H) 70 - 99 mg/dL  Glucose, capillary  Result Value Ref Range   Glucose-Capillary 242 (H) 70 - 99 mg/dL  CBC  Result Value Ref Range   WBC 7.3 4.0 - 10.5 K/uL   RBC 4.38 3.87 - 5.11 MIL/uL   Hemoglobin 12.8 12.0 - 15.0 g/dL  HCT 38.8 36.0 - 46.0 %   MCV 88.6 80.0 - 100.0 fL   MCH 29.2 26.0 - 34.0 pg   MCHC 33.0 30.0 - 36.0 g/dL   RDW 13.0 11.5 - 15.5 %   Platelets 200 150 - 400 K/uL   nRBC 0.0 0.0 - 0.2 %  Comprehensive metabolic panel  Result Value Ref Range   Sodium 138 135 - 145 mmol/L   Potassium 3.7 3.5 - 5.1 mmol/L   Chloride 103 98 - 111 mmol/L   CO2 26 22 - 32 mmol/L   Glucose, Bld 124 (H) 70 - 99 mg/dL   BUN 9 8 - 23 mg/dL   Creatinine, Ser 0.76 0.44 - 1.00 mg/dL   Calcium 8.5 (L) 8.9 - 10.3 mg/dL   Total Protein 6.0 (L) 6.5 - 8.1 g/dL   Albumin 3.3 (L) 3.5 - 5.0 g/dL   AST 96 (H) 15 - 41 U/L   ALT 147 (H) 0 - 44 U/L   Alkaline Phosphatase 59 38 - 126 U/L   Total Bilirubin 0.7 0.3 - 1.2 mg/dL   GFR, Estimated >60 >60 mL/min   Anion gap 9 5 - 15  Hemoglobin A1c  Result Value Ref Range   Hgb A1c MFr Bld 8.3 (H) 4.8 - 5.6 %   Mean Plasma Glucose 191.51 mg/dL  Lipase, blood  Result Value Ref Range   Lipase 36 11 - 51 U/L  Glucose, capillary  Result Value Ref Range   Glucose-Capillary 150 (H) 70 - 99 mg/dL  Glucose, capillary  Result Value Ref Range   Glucose-Capillary 315 (H) 70 - 99 mg/dL  Glucose, capillary  Result Value Ref Range   Glucose-Capillary 135 (H) 70 - 99 mg/dL  Glucose, capillary  Result Value Ref Range   Glucose-Capillary 170 (H) 70 - 99 mg/dL      Assessment & Plan:   Problem List Items Addressed This Visit       Digestive   Acute pancreatitis - Primary    Acute and improving, suspect cause Mounjaro and previous GLP1 therapy.  Had tolerated Ozempic well for  years.  At this time will maintain off GLP1 or Mounjaro due to side effects.  Continue Jardiance and Metformin + will add on low dose Glipizide 5 MG daily, however discussed with patient to monitor sugars closely with this.  Would prefer to avoid insulin unless needed.  Could consider Januvia.  Labs today: CMP, Lipase, Amylase.  Return as scheduled upcoming.      Relevant Orders   Comprehensive metabolic panel   Lipase   Amylase     Endocrine   Type 2 diabetes mellitus with obesity (HCC)    Chronic, ongoing.  Urine ALB 30 October 2021.  A1c 8.3% which is increased from previous 7.5%, pancreatitis presented with Mounjaro.  Had tolerated Ozempic well for years.  At this time will maintain off GLP1 or Mounjaro due to side effects.  Continue Jardiance and Metformin + will add on low dose Glipizide 5 MG daily, however discussed with patient to monitor sugars closely with this.  Would prefer to avoid insulin unless needed.  Check blood sugar 3 times a day and document for provider.        Relevant Medications   glipiZIDE (GLUCOTROL) 5 MG tablet     Follow up plan: Return in about 4 weeks (around 06/23/2022) for T2DM.

## 2022-05-26 NOTE — Telephone Encounter (Signed)
Requested Prescriptions  Pending Prescriptions Disp Refills  . JARDIANCE 25 MG TABS tablet [Pharmacy Med Name: JARDIANCE 25 MG TABLET] 90 tablet 1    Sig: TAKE 1 TABLET (25 MG TOTAL) BY MOUTH DAILY.     Endocrinology:  Diabetes - SGLT2 Inhibitors Failed - 05/25/2022  2:19 AM      Failed - HBA1C is between 0 and 7.9 and within 180 days    HB A1C (BAYER DCA - WAIVED)  Date Value Ref Range Status  03/05/2022 8.9 (H) 4.8 - 5.6 % Final    Comment:             Prediabetes: 5.7 - 6.4          Diabetes: >6.4          Glycemic control for adults with diabetes: <7.0    Hgb A1c MFr Bld  Date Value Ref Range Status  05/24/2022 8.3 (H) 4.8 - 5.6 % Final    Comment:    (NOTE) Pre diabetes:          5.7%-6.4%  Diabetes:              >6.4%  Glycemic control for   <7.0% adults with diabetes          Passed - Cr in normal range and within 360 days    Creatinine, Ser  Date Value Ref Range Status  05/24/2022 0.76 0.44 - 1.00 mg/dL Final         Passed - eGFR in normal range and within 360 days    GFR calc Af Amer  Date Value Ref Range Status  05/08/2020 95 >59 mL/min/1.73 Final    Comment:    **[facility] currently reports eGFR in compliance with the current**   recommendations of the [employer]. [facility] will   update reporting as new guidelines are published from the NKF-ASN   Task force.    GFR, Estimated  Date Value Ref Range Status  05/24/2022 >60 >60 mL/min Final    Comment:    (NOTE) Calculated using the CKD-EPI Creatinine Equation (2021)    eGFR  Date Value Ref Range Status  11/23/2021 87 >59 mL/min/1.73 Final         Passed - Valid encounter within last 6 months    Recent Outpatient Visits          Today Drug-induced acute pancreatitis without infection or necrosis   [facility] [place], Jolene T, NP   2 months ago Type 2 diabetes mellitus with obesity ([place])   [place], Jolene T, NP   5 months ago Type 2 diabetes  mellitus with obesity ([place])   [place], Jolene T, NP   6 months ago Type 2 diabetes mellitus with obesity ([place])   [place] Cannady, Jolene T, NP   7 months ago Gastroesophageal reflux disease without esophagitis   [place], Barbaraann Faster, NP      Future Appointments            In 4 weeks Cannady, Barbaraann Faster, NP [employer]

## 2022-05-26 NOTE — Patient Instructions (Signed)
[email or web address] check this for recipes  Diabetes Mellitus and Exercise Exercising regularly is important for overall health, especially for people who have diabetes mellitus. Exercising is not only about losing weight. It has many other health benefits, such as increasing muscle strength and bone density and reducing body fat and stress. This leads to improved fitness, flexibility, and endurance, all of which result in better overall health. What are the benefits of exercise if I have diabetes? Exercise has many benefits for people with diabetes. They include: Helping to lower and control blood sugar (glucose). Helping the body to respond better to the hormone insulin by improving insulin sensitivity. Reducing how much insulin the body needs. Lowering the risk for heart disease by: Lowering "bad" cholesterol and triglyceride levels. Increasing "good" cholesterol levels. Lowering blood pressure. Lowering blood glucose levels. What is my activity plan? Your health care provider or certified diabetes educator can help you make a plan for the type and frequency of exercise that works for you. This is called your activity plan. Be sure to: Get at least 150 minutes of medium-intensity or high-intensity exercise each week. Exercises may include brisk walking, biking, or water aerobics. Do stretching and strengthening exercises, such as yoga or weight lifting, at least 2 times a week. Spread out your activity over at least 3 days of the week. Get some form of physical activity each day. Do not go more than 2 days in a row without some kind of physical activity. Avoid being inactive for more than 90 minutes at a time. Take frequent breaks to walk or stretch. Choose exercises or activities that you enjoy. Set realistic goals. Start slowly and gradually increase your exercise intensity over time. How do I manage my diabetes during exercise?  Monitor your blood glucose Check your blood glucose  before and after exercising. If your blood glucose is: 240 mg/dL (13.3 mmol/L) or higher before you exercise, check your urine for ketones. These are chemicals created by the liver. If you have ketones in your urine, do not exercise until your blood glucose returns to normal. 100 mg/dL (5.6 mmol/L) or lower, eat a snack containing 15-20 grams of carbohydrate. Check your blood glucose 15 minutes after the snack to make sure that your glucose level is above 100 mg/dL (5.6 mmol/L) before you start your exercise. Know the symptoms of low blood glucose (hypoglycemia) and how to treat it. Your risk for hypoglycemia increases during and after exercise. Follow these tips and your health care provider's instructions Keep a carbohydrate snack that is fast-acting for use before, during, and after exercise to help prevent or treat hypoglycemia. Avoid injecting insulin into areas of the body that are going to be exercised. For example, avoid injecting insulin into: Your arms, when you are about to play tennis. Your legs, when you are about to go jogging. Keep records of your exercise habits. Doing this can help you and your health care provider adjust your diabetes management plan as needed. Write down: Food that you eat before and after you exercise. Blood glucose levels before and after you exercise. The type and amount of exercise you have done. Work with your health care provider when you start a new exercise or activity. He or she may need to: Make sure that the activity is safe for you. Adjust your insulin, other medicines, and food that you eat. Drink plenty of water while you exercise. This prevents loss of water (dehydration) and problems caused by a lot of heat in the body (  heat stroke). Where to find more information American Diabetes Association: www.diabetes.org Summary Exercising regularly is important for overall health, especially for people who have diabetes mellitus. Exercising has many  health benefits. It increases muscle strength and bone density and reduces body fat and stress. It also lowers and controls blood glucose. Your health care provider or certified diabetes educator can help you make an activity plan for the type and frequency of exercise that works for you. Work with your health care provider to make sure any new activity is safe for you. Also work with your health care provider to adjust your insulin, other medicines, and the food you eat. This information is not intended to replace advice given to you by your health care provider. Make sure you discuss any questions you have with your health care provider. Document Revised: 06/04/2019 Document Reviewed: 06/04/2019 Elsevier Patient Education  [place].

## 2022-05-26 NOTE — Assessment & Plan Note (Signed)
Acute and improving, suspect cause Mounjaro and previous GLP1 therapy.  Had tolerated Ozempic well for years.  At this time will maintain off GLP1 or Mounjaro due to side effects.  Continue Jardiance and Metformin + will add on low dose Glipizide 5 MG daily, however discussed with patient to monitor sugars closely with this.  Would prefer to avoid insulin unless needed.  Could consider Januvia.  Labs today: CMP, Lipase, Amylase.  Return as scheduled upcoming.

## 2022-05-27 LAB — COMPREHENSIVE METABOLIC PANEL
ALT: 162 IU/L — ABNORMAL HIGH (ref 0–32)
AST: 114 IU/L — ABNORMAL HIGH (ref 0–40)
Albumin/Globulin Ratio: 1.7 (ref 1.2–2.2)
Albumin: 4.3 g/dL (ref 3.9–4.9)
Alkaline Phosphatase: 74 IU/L (ref 44–121)
BUN/Creatinine Ratio: 25 (ref 12–28)
BUN: 22 mg/dL (ref 8–27)
Bilirubin Total: 0.3 mg/dL (ref 0.0–1.2)
CO2: 23 mmol/L (ref 20–29)
Calcium: 10 mg/dL (ref 8.7–10.3)
Chloride: 99 mmol/L (ref 96–106)
Creatinine, Ser: 0.87 mg/dL (ref 0.57–1.00)
Globulin, Total: 2.5 g/dL (ref 1.5–4.5)
Glucose: 115 mg/dL — ABNORMAL HIGH (ref 70–99)
Potassium: 4 mmol/L (ref 3.5–5.2)
Sodium: 138 mmol/L (ref 134–144)
Total Protein: 6.8 g/dL (ref 6.0–8.5)
eGFR: 72 mL/min/{1.73_m2} (ref >59)

## 2022-05-27 LAB — AMYLASE: Amylase: 66 U/L (ref 31–110)

## 2022-05-27 LAB — LIPASE: Lipase: 40 U/L (ref 14–72)

## 2022-05-27 NOTE — Progress Notes (Signed)
Contacted via [employer] afternoon Charlene Juarez, your labs have returned: - Kidney function, eGFR and creatinine, are normal. - Liver function, AST and ALT, remain elevated.  We will recheck next visit on 06/23/22 -- continue off Ozempic and Mounjaro.  Avoid Tylenol and alcohol use as these could aggravate liver.  - Pancreas labs are normal.  Any questions? Keep being amazing!!  Thank you for allowing me to participate in your care.  I appreciate you. Kindest regards, Ferrel Simington

## 2022-05-31 ENCOUNTER — Other Ambulatory Visit: Payer: Self-pay | Admitting: Gastroenterology

## 2022-05-31 ENCOUNTER — Other Ambulatory Visit: Payer: Self-pay | Admitting: Nurse Practitioner

## 2022-06-01 NOTE — Telephone Encounter (Signed)
HCTZ refilled 02/22/2022 #90 1 refill - too soon. Trazodone was d/c's 08/25/2021 not on med list Jardiance refilled 05/26/2022 #90 0 refills - too soon. Requested Prescriptions  Pending Prescriptions Disp Refills  . metFORMIN (GLUCOPHAGE) 1000 MG tablet [Pharmacy Med Name: METFORMIN HCL 1,000 MG TABLET] 180 tablet 1    Sig: TAKE 1 TABLET (1,000 MG TOTAL) BY MOUTH 2 (TWO) TIMES DAILY WITH A MEAL.     Endocrinology:  Diabetes - Biguanides Failed - 05/31/2022  8:56 AM      Failed - HBA1C is between 0 and 7.9 and within 180 days    HB A1C (BAYER DCA - WAIVED)  Date Value Ref Range Status  03/05/2022 8.9 (H) 4.8 - 5.6 % Final    Comment:             Prediabetes: 5.7 - 6.4          Diabetes: >6.4          Glycemic control for adults with diabetes: <7.0    Hgb A1c MFr Bld  Date Value Ref Range Status  05/24/2022 8.3 (H) 4.8 - 5.6 % Final    Comment:    (NOTE) Pre diabetes:          5.7%-6.4%  Diabetes:              >6.4%  Glycemic control for   <7.0% adults with diabetes          Failed - CBC within normal limits and completed in the last 12 months    WBC  Date Value Ref Range Status  05/24/2022 7.3 4.0 - 10.5 K/uL Final   RBC  Date Value Ref Range Status  05/24/2022 4.38 3.87 - 5.11 MIL/uL Final   Hemoglobin  Date Value Ref Range Status  05/24/2022 12.8 12.0 - 15.0 g/dL Final  10/23/2021 15.1 11.1 - 15.9 g/dL Final   HCT  Date Value Ref Range Status  05/24/2022 38.8 36.0 - 46.0 % Final   Hematocrit  Date Value Ref Range Status  10/23/2021 46.4 34.0 - 46.6 % Final   MCHC  Date Value Ref Range Status  05/24/2022 33.0 30.0 - 36.0 g/dL Final   [facility]  Date Value Ref Range Status  05/24/2022 29.2 26.0 - 34.0 pg Final   MCV  Date Value Ref Range Status  05/24/2022 88.6 80.0 - 100.0 fL Final  10/23/2021 88 79 - 97 fL Final   No results found for: "PLTCOUNTKUC", "LABPLAT", "POCPLA" RDW  Date Value Ref Range Status  05/24/2022 13.0 11.5 - 15.5 % Final  10/23/2021 12.1  11.7 - 15.4 % Final         Passed - Cr in normal range and within 360 days    Creatinine, Ser  Date Value Ref Range Status  05/26/2022 0.87 0.57 - 1.00 mg/dL Final         Passed - eGFR in normal range and within 360 days    GFR calc Af Amer  Date Value Ref Range Status  05/08/2020 95 >59 mL/min/1.73 Final    Comment:    **[facility] currently reports eGFR in compliance with the current**   recommendations of the [employer]. [facility] will   update reporting as new guidelines are published from the NKF-ASN   Task force.    GFR, Estimated  Date Value Ref Range Status  05/24/2022 >60 >60 mL/min Final    Comment:    (NOTE) Calculated using the CKD-EPI Creatinine Equation (2021)  eGFR  Date Value Ref Range Status  05/26/2022 72 >59 mL/min/1.73 Final         Passed - B12 Level in normal range and within 720 days    Vitamin B-12  Date Value Ref Range Status  08/25/2021 603 232 - 1,245 pg/mL Final         Passed - Valid encounter within last 6 months    Recent Outpatient Visits          6 days ago Drug-induced acute pancreatitis without infection or necrosis   [facility] [place], Jolene T, NP   2 months ago Type 2 diabetes mellitus with obesity ([place])   [place], Jolene T, NP   5 months ago Type 2 diabetes mellitus with obesity ([place])   [place], Jolene T, NP   6 months ago Type 2 diabetes mellitus with obesity ([place])   [place] Cannady, Jolene T, NP   7 months ago Gastroesophageal reflux disease without esophagitis   [place], Barbaraann Faster, NP      Future Appointments            In 3 weeks Cannady, Barbaraann Faster, NP [facility], [facility]           . rosuvastatin (CRESTOR) 20 MG tablet [Pharmacy Med Name: ROSUVASTATIN CALCIUM 20 MG TAB] 90 tablet 3    Sig: TAKE 1 TABLET BY MOUTH EVERY DAY     Cardiovascular:  Antilipid - Statins 2 Failed -  05/31/2022  8:56 AM      Failed - Lipid Panel in normal range within the last 12 months    Cholesterol, Total  Date Value Ref Range Status  11/23/2021 135 100 - 199 mg/dL Final   LDL Chol Calc (NIH)  Date Value Ref Range Status  11/23/2021 59 0 - 99 mg/dL Final   HDL  Date Value Ref Range Status  11/23/2021 53 >39 mg/dL Final   Triglycerides  Date Value Ref Range Status  11/23/2021 131 0 - 149 mg/dL Final         Passed - Cr in normal range and within 360 days    Creatinine, Ser  Date Value Ref Range Status  05/26/2022 0.87 0.57 - 1.00 mg/dL Final         Passed - Patient is not pregnant      Passed - Valid encounter within last 12 months    Recent Outpatient Visits          6 days ago Drug-induced acute pancreatitis without infection or necrosis   [facility] [place], Jolene T, NP   2 months ago Type 2 diabetes mellitus with obesity ([place])   [place], Jolene T, NP   5 months ago Type 2 diabetes mellitus with obesity ([place])   [place], Jolene T, NP   6 months ago Type 2 diabetes mellitus with obesity ([place])   [place], Jolene T, NP   7 months ago Gastroesophageal reflux disease without esophagitis   [place] Cannady, Barbaraann Faster, NP      Future Appointments            In 3 weeks Cannady, Barbaraann Faster, NP [facility]           . telmisartan (MICARDIS) 80 MG tablet [Pharmacy Med Name: TELMISARTAN 80 MG TABLET] 90 tablet 1    Sig: TAKE 1 TABLET BY MOUTH EVERY  DAY     Cardiovascular:  Angiotensin Receptor Blockers Passed - 05/31/2022  8:56 AM      Passed - Cr in normal range and within 180 days    Creatinine, Ser  Date Value Ref Range Status  05/26/2022 0.87 0.57 - 1.00 mg/dL Final         Passed - K in normal range and within 180 days    Potassium  Date Value Ref Range Status  05/26/2022 4.0 3.5 - 5.2 mmol/L Final         Passed - Patient is not  pregnant      Passed - Last BP in normal range    BP Readings from Last 1 Encounters:  05/26/22 120/69         Passed - Valid encounter within last 6 months    Recent Outpatient Visits          6 days ago Drug-induced acute pancreatitis without infection or necrosis   [facility] [place], Jolene T, NP   2 months ago Type 2 diabetes mellitus with obesity ([place])   [place], Jolene T, NP   5 months ago Type 2 diabetes mellitus with obesity ([place])   [place], Jolene T, NP   6 months ago Type 2 diabetes mellitus with obesity ([place])   [place], Jolene T, NP   7 months ago Gastroesophageal reflux disease without esophagitis   [place], Barbaraann Faster, NP      Future Appointments            In 3 weeks Cannady, Barbaraann Faster, NP [facility]           Refused Prescriptions Disp Refills  . hydrochlorothiazide (HYDRODIURIL) 25 MG tablet [Pharmacy Med Name: HYDROCHLOROTHIAZIDE 25 MG TAB] 90 tablet 1    Sig: TAKE 1 TABLET (25 MG TOTAL) BY MOUTH DAILY.     Cardiovascular: Diuretics - Thiazide Passed - 05/31/2022  8:56 AM      Passed - Cr in normal range and within 180 days    Creatinine, Ser  Date Value Ref Range Status  05/26/2022 0.87 0.57 - 1.00 mg/dL Final         Passed - K in normal range and within 180 days    Potassium  Date Value Ref Range Status  05/26/2022 4.0 3.5 - 5.2 mmol/L Final         Passed - Na in normal range and within 180 days    Sodium  Date Value Ref Range Status  05/26/2022 138 134 - 144 mmol/L Final         Passed - Last BP in normal range    BP Readings from Last 1 Encounters:  05/26/22 120/69         Passed - Valid encounter within last 6 months    Recent Outpatient Visits          6 days ago Drug-induced acute pancreatitis without infection or necrosis   [facility] [place], Jolene T, NP   2 months ago Type 2  diabetes mellitus with obesity ([place])   [place], Jolene T, NP   5 months ago Type 2 diabetes mellitus with obesity ([place])   [place], Jolene T, NP   6 months ago Type 2 diabetes mellitus with obesity ([place])   [place], Clarence T, NP   7 months ago Gastroesophageal reflux disease without  esophagitis   [facility] [place], Barbaraann Faster, NP      Future Appointments            In 3 weeks Cannady, Barbaraann Faster, NP [facility]           . traZODone (DESYREL) 50 MG tablet [employer] Med Name: TRAZODONE 50 MG TABLET] 90 tablet 0    Sig: TAKE 1/2 TO 1 TABLET BY MOUTH AT BEDTIME AS NEEDED FOR SLEEP     Psychiatry: Antidepressants - Serotonin Modulator Passed - 05/31/2022  8:56 AM      Passed - Valid encounter within last 6 months    Recent Outpatient Visits          6 days ago Drug-induced acute pancreatitis without infection or necrosis   [facility] [place], Jolene T, NP   2 months ago Type 2 diabetes mellitus with obesity ([place])   [place], Jolene T, NP   5 months ago Type 2 diabetes mellitus with obesity ([place])   [place], Jolene T, NP   6 months ago Type 2 diabetes mellitus with obesity ([place])   [place] Cannady, Jolene T, NP   7 months ago Gastroesophageal reflux disease without esophagitis   [place], Barbaraann Faster, NP      Future Appointments            In 3 weeks Cannady, Barbaraann Faster, NP [facility]           . JARDIANCE 25 MG TABS tablet [Pharmacy Med Name: JARDIANCE 25 MG TABLET] 90 tablet 0    Sig: TAKE 1 TABLET (25 MG TOTAL) BY MOUTH DAILY.     Endocrinology:  Diabetes - SGLT2 Inhibitors Failed - 05/31/2022  8:56 AM      Failed - HBA1C is between 0 and 7.9 and within 180 days    HB A1C (BAYER DCA - WAIVED)  Date Value Ref Range Status  03/05/2022 8.9 (H) 4.8 - 5.6 %  Final    Comment:             Prediabetes: 5.7 - 6.4          Diabetes: >6.4          Glycemic control for adults with diabetes: <7.0    Hgb A1c MFr Bld  Date Value Ref Range Status  05/24/2022 8.3 (H) 4.8 - 5.6 % Final    Comment:    (NOTE) Pre diabetes:          5.7%-6.4%  Diabetes:              >6.4%  Glycemic control for   <7.0% adults with diabetes          Passed - Cr in normal range and within 360 days    Creatinine, Ser  Date Value Ref Range Status  05/26/2022 0.87 0.57 - 1.00 mg/dL Final         Passed - eGFR in normal range and within 360 days    GFR calc Af Amer  Date Value Ref Range Status  05/08/2020 95 >59 mL/min/1.73 Final    Comment:    **[facility] currently reports eGFR in compliance with the current**   recommendations of the [employer]. [facility] will   update reporting as new guidelines are published from the NKF-ASN   Task force.    GFR, Estimated  Date Value Ref Range Status  05/24/2022 >60 >60 mL/min  Final    Comment:    (NOTE) Calculated using the CKD-EPI Creatinine Equation (2021)    eGFR  Date Value Ref Range Status  05/26/2022 72 >59 mL/min/1.73 Final         Passed - Valid encounter within last 6 months    Recent Outpatient Visits          6 days ago Drug-induced acute pancreatitis without infection or necrosis   [facility] [place], Jolene T, NP   2 months ago Type 2 diabetes mellitus with obesity ([place])   [place], Jolene T, NP   5 months ago Type 2 diabetes mellitus with obesity ([place])   [place], Jolene T, NP   6 months ago Type 2 diabetes mellitus with obesity ([place])   [place] Cannady, Jolene T, NP   7 months ago Gastroesophageal reflux disease without esophagitis   [place], Barbaraann Faster, NP      Future Appointments            In 3 weeks Cannady, Barbaraann Faster, NP [facility]

## 2022-06-07 ENCOUNTER — Ambulatory Visit: Payer: Medicare Other | Admitting: Nurse Practitioner

## 2022-06-18 NOTE — Patient Instructions (Incomplete)
Start long acting insulin at night before bedtime, 10 units to start then check blood every morning == if in 3 days blood sugars are still above 130 then increase insulin to 13 units, continue this regimen every 3 days.  Message me on MyChart next week to alert me as to where sugars are at.  Also ensure to check sugars 2 hours after each meal with goal <180.    Diabetes Mellitus and Standards of [place] with and managing diabetes (diabetes mellitus) can be complicated. Your diabetes treatment may be managed by a team of health care providers, including: A physician who specializes in diabetes (endocrinologist). You might also have visits with a nurse practitioner or physician assistant. Nurses. A registered dietitian. A certified diabetes care and education specialist. An exercise specialist. A pharmacist. An eye doctor. A foot specialist (podiatrist). A dental care provider. A primary care provider. A mental health care provider. How to manage your diabetes You can do many things to successfully manage your diabetes. Your health care providers will follow guidelines to help you get the best quality of care. Here are general guidelines for your diabetes management plan. Your health care providers may give you more specific instructions. Physical exams When you are diagnosed with diabetes, and each year after that, your health care provider will ask about your medical and family history. You will have a physical exam, which may include: Measuring your height, weight, and body mass index (BMI). Checking your blood pressure. This will be done at every routine medical visit. Your target blood pressure may vary depending on your medical conditions, your age, and other factors. A thyroid exam. A skin exam. Screening for nerve damage (peripheral neuropathy). This may include checking the pulse in your legs and feet and the level of sensation in your hands and feet. A foot exam to inspect  the structure and skin of your feet, including checking for cuts, bruises, redness, blisters, sores, or other problems. Screening for blood vessel (vascular) problems. This may include checking the pulse in your legs and feet and checking your temperature. Blood tests Depending on your treatment plan and your personal needs, you may have the following tests: Hemoglobin A1C (HbA1C). This test provides information about blood sugar (glucose) control over the previous 2-3 months. It is used to adjust your treatment plan, if needed. This test will be done: At least 2 times a year, if you are meeting your treatment goals. 4 times a year, if you are not meeting your treatment goals or if your goals have changed. Lipid testing, including total cholesterol, LDL and HDL cholesterol, and triglyceride levels. The goal for LDL is less than 100 mg/dL (5.5 mmol/L). If you are at high risk for complications, the goal is less than 70 mg/dL (3.9 mmol/L). The goal for HDL is 40 mg/dL (2.2 mmol/L) or higher for men, and 50 mg/dL (2.8 mmol/L) or higher for women. An HDL cholesterol of 60 mg/dL (3.3 mmol/L) or higher gives some protection against heart disease. The goal for triglycerides is less than 150 mg/dL (8.3 mmol/L). Liver function tests. Kidney function tests. Thyroid function tests.  Dental and eye exams  Visit your dentist two times a year. If you have type 1 diabetes, your health care provider may recommend an eye exam within 5 years after you are diagnosed, and then once a year after your first exam. For children with type 1 diabetes, the health care provider may recommend an eye exam when your child is  age 36 or older and has had diabetes for 3-5 years. After the first exam, your child should get an eye exam once a year. If you have type 2 diabetes, your health care provider may recommend an eye exam as soon as you are diagnosed, and then every 1-2 years after your first exam. Immunizations A yearly  flu (influenza) vaccine is recommended annually for everyone 6 months or older. This is especially important if you have diabetes. The pneumonia (pneumococcal) vaccine is recommended for everyone 2 years or older who has diabetes. If you are age 61 or older, you may get the pneumonia vaccine as a series of two separate shots. The hepatitis B vaccine is recommended for adults shortly after being diagnosed with diabetes. Adults and children with diabetes should receive all other vaccines according to age-specific recommendations from the Centers for Disease Control and Prevention (CDC). Mental and emotional health Screening for symptoms of eating disorders, anxiety, and depression is recommended at the time of diagnosis and after as needed. If your screening shows that you have symptoms, you may need more evaluation. You may work with a mental health care provider. Follow these instructions at home: Treatment plan You will monitor your blood glucose levels and may give yourself insulin. Your treatment plan will be reviewed at every medical visit. You and your health care provider will discuss: How you are taking your medicines, including insulin. Any side effects you have. Your blood glucose level target goals. How often you monitor your blood glucose level. Lifestyle habits, such as activity level and tobacco, alcohol, and substance use. Education Your health care provider will assess how well you are monitoring your blood glucose levels and whether you are taking your insulin and medicines correctly. He or she may refer you to: A certified diabetes care and education specialist to manage your diabetes throughout your life, starting at diagnosis. A registered dietitian who can create and review your personal nutrition plan. An exercise specialist who can discuss your activity level and exercise plan. General instructions Take over-the-counter and prescription medicines only as told by your health  care provider. Keep all follow-up visits. This is important. Where to find support There are many diabetes support networks, including: American Diabetes Association (ADA): diabetes.org Defeat Diabetes Foundation: defeatdiabetes.org Where to find more information American Diabetes Association (ADA): www.diabetes.org Association of Diabetes Care & Education Specialists (ADCES): diabeteseducator.org International Diabetes Federation (IDF): https://www.munoz-bell.org/ Summary Managing diabetes (diabetes mellitus) can be complicated. Your diabetes treatment may be managed by a team of health care providers. Your health care providers follow guidelines to help you get the best quality care. You should have physical exams, blood tests, blood pressure monitoring, immunizations, and screening tests regularly. Stay updated on how to manage your diabetes. Your health care providers may also give you more specific instructions based on your individual health. This information is not intended to replace advice given to you by your health care provider. Make sure you discuss any questions you have with your health care provider. Document Revised: 03/13/2020 Document Reviewed: 03/13/2020 Elsevier Patient Education  [place].

## 2022-06-23 ENCOUNTER — Ambulatory Visit (INDEPENDENT_AMBULATORY_CARE_PROVIDER_SITE_OTHER): Payer: Medicare Other | Admitting: Nurse Practitioner

## 2022-06-23 ENCOUNTER — Encounter: Payer: Self-pay | Admitting: Nurse Practitioner

## 2022-06-23 ENCOUNTER — Telehealth: Payer: Self-pay | Admitting: Nurse Practitioner

## 2022-06-23 ENCOUNTER — Other Ambulatory Visit: Payer: Self-pay | Admitting: Nurse Practitioner

## 2022-06-23 VITALS — BP 114/68 | HR 68 | Temp 98.1°F | Ht 62.0 in | Wt 161.5 lb

## 2022-06-23 DIAGNOSIS — Z23 Encounter for immunization: Secondary | ICD-10-CM | POA: Diagnosis not present

## 2022-06-23 DIAGNOSIS — E669 Obesity, unspecified: Secondary | ICD-10-CM | POA: Diagnosis not present

## 2022-06-23 DIAGNOSIS — E1169 Type 2 diabetes mellitus with other specified complication: Secondary | ICD-10-CM

## 2022-06-23 MED ORDER — LEVEMIR FLEXPEN 100 UNIT/ML ~~LOC~~ SOPN
10.0000 [IU] | PEN_INJECTOR | Freq: Every day | SUBCUTANEOUS | 11 refills | Status: DC
Start: 2022-06-23 — End: 2022-07-26

## 2022-06-23 MED ORDER — INSULIN PEN NEEDLE 31G X 8 MM MISC: 4 refills | Status: DC

## 2022-06-23 MED ORDER — INSULIN PEN NEEDLE 30G X 8 MM MISC: 5 refills | Status: DC

## 2022-06-23 NOTE — Assessment & Plan Note (Signed)
Chronic, ongoing.  Urine ALB 30 October 2021.  A1c 8.3% on 05/24/22 which is increased from previous 7.5%, pancreatitis presented with [place].  Had tolerated Ozempic well for years until recently.   - At this time will maintain off GLP1 or Mounjaro due to side effects.   - Continue Jardiance and Metformin.  Stop Glipizide as do not feel in long run this is going to offer overall benefit to sugars and concerns for increasing dose and her age >55.   - Start Levemir 10 units at night and educated her on how to increase this and wrote instructions down for this -- if sugars in 3 days are >130 in morning then increase by 3 units, continue this increase every 3 days and MyChart provider next week with sugars. - Check blood sugar 3 times a day and document for provider.   - Eye and foot exam up to date. - Flu shot today and Pneumonia vaccines up to date. Return in 4 weeks.

## 2022-06-23 NOTE — Telephone Encounter (Signed)
Copied from [place] [id number]. Topic: General - Other >> Jun 23, 2022  9:04 AM Cyndi Bender wrote: Reason for CRM: Pt stated she received a message from [facility] stating that they need the provider to contact them regarding Rx that was sent. Pt requests that provider contact [facility] to resolve. [id number] [place] - [address]  Phone: [phone] Fax: [phone]

## 2022-06-23 NOTE — Telephone Encounter (Signed)
Pharmacy only has BD products not Novofine.  Requested Prescriptions  Pending Prescriptions Disp Refills  . Insulin Pen Needle (B-D ULTRAFINE III SHORT PEN) 31G X 8 MM MISC [Pharmacy Med Name: BD UF SHORT PEN NEEDLE [id number]] 100 each 5    Sig: Use to inject insulin into skin daily.     Endocrinology: Diabetes - Testing Supplies Passed - 06/23/2022  9:19 AM      Passed - Valid encounter within last 12 months    Recent Outpatient Visits          Today Type 2 diabetes mellitus with obesity ([place])   [place], Jolene T, NP   4 weeks ago Drug-induced acute pancreatitis without infection or necrosis   [facility] [place], Ramos T, NP   3 months ago Type 2 diabetes mellitus with obesity ([place])   [place], Jolene T, NP   6 months ago Type 2 diabetes mellitus with obesity ([place])   [place], Jolene T, NP   7 months ago Type 2 diabetes mellitus with obesity ([place])   [place], Barbaraann Faster, NP      Future Appointments            In 1 month Cannady, Barbaraann Faster, NP [facility]

## 2022-06-23 NOTE — Progress Notes (Signed)
BP 114/68   Pulse 68   Temp 98.1 F (36.7 C) (Oral)   Ht _0  (1.575 m)   Wt 161 lb 8 oz (73.3 kg)   LMP 07/13/2000 (Approximate)   SpO2 97%   BMI 29.54 kg/m    Subjective:    Patient ID: Charlene Juarez, female    DOB: [birth year], 71 y.o.   MRN: [id number]  HPI: Charlene Juarez is a 70 y.o. female  Chief Complaint  Patient presents with   Diabetes    Patient is here for a four week follow up on Diabetes. Patient says things are not going so well as she is fighting to keep her blood sugars down even in the mornings. Patient says if she does not eat, she feels it would be better. Patient says she ate one night and it went 400. Patient would like to discuss possible dose change with Glipizide.    DIABETES Currently taking Jardiance and Metformin.  Recent pancreatitis with Ozempic and Mounjaro.  On 05/26/22 we started Glipizide to assist with sugars. Recent A1c 8.3%.  Had an episode of sugar going to 418 -- pizza and cabbage rolls eaten.   Hypoglycemic episodes:no Polydipsia/polyuria: yes Visual disturbance: no Chest pain: no Paresthesias: no Glucose Monitoring: yes  Accucheck frequency: BID -- >130 continuously  Fasting glucose:  Post prandial:  Evening:  Before meals: Taking Insulin?: no  Long acting insulin:  Short acting insulin: Blood Pressure Monitoring: daily Retinal Examination: Up to Date Foot Exam: Up to Date Pneumovax: Up to Date Influenza: Up to Date Aspirin: no   Relevant past medical, surgical, family and social history reviewed and updated as indicated. Interim medical history since our last visit reviewed. Allergies and medications reviewed and updated.  Review of Systems  Constitutional:  Negative for activity change, appetite change, diaphoresis, fatigue and fever.  Respiratory:  Negative for cough, chest tightness, shortness of breath and wheezing.   Cardiovascular:  Negative for chest pain, palpitations and leg swelling.   Endocrine: Positive for polydipsia and polyphagia. Negative for polyuria.  Neurological: Negative.   Psychiatric/Behavioral: Negative.      Per HPI unless specifically indicated above     Objective:    BP 114/68   Pulse 68   Temp 98.1 F (36.7 C) (Oral)   Ht _1  (1.575 m)   Wt 161 lb 8 oz (73.3 kg)   LMP 07/13/2000 (Approximate)   SpO2 97%   BMI 29.54 kg/m   Wt Readings from Last 3 Encounters:  06/23/22 161 lb 8 oz (73.3 kg)  05/26/22 160 lb 6.4 oz (72.8 kg)  05/23/22 160 lb 11.5 oz (72.9 kg)    Physical Exam Vitals and nursing note reviewed.  Constitutional:      General: She is awake. She is not in acute distress.    Appearance: She is well-developed and well-groomed. She is obese. She is not ill-appearing or toxic-appearing.  HENT:     Head: Normocephalic.  Eyes:     General: Lids are normal. No scleral icterus.    Pupils: Pupils are equal, round, and reactive to light.  Neck:     Vascular: No carotid bruit.  Cardiovascular:     Rate and Rhythm: Normal rate and regular rhythm.     Pulses: Normal pulses.     Heart sounds: Normal heart sounds. No murmur heard.    No gallop.  Pulmonary:     Effort: Pulmonary effort is normal. No accessory muscle usage or respiratory distress.  Breath sounds: Normal breath sounds.  Abdominal:     General: Bowel sounds are normal. There is no distension.     Palpations: Abdomen is soft.  Musculoskeletal:     Cervical back: Full passive range of motion without pain.     Right lower leg: No edema.     Left lower leg: No edema.  Skin:    General: Skin is warm and dry.  Neurological:     Mental Status: She is alert.  Psychiatric:        Attention and Perception: Attention normal.        Mood and Affect: Mood normal.        Speech: Speech normal.        Behavior: Behavior is cooperative.        Thought Content: Thought content normal.     Results for orders placed or performed in visit on 05/26/22  Comprehensive  metabolic panel  Result Value Ref Range   Glucose 115 (H) 70 - 99 mg/dL   BUN 22 8 - 27 mg/dL   Creatinine, Ser 0.87 0.57 - 1.00 mg/dL   eGFR 72 >59 mL/min/1.73   BUN/Creatinine Ratio 25 12 - 28   Sodium 138 134 - 144 mmol/L   Potassium 4.0 3.5 - 5.2 mmol/L   Chloride 99 96 - 106 mmol/L   CO2 23 20 - 29 mmol/L   Calcium 10.0 8.7 - 10.3 mg/dL   Total Protein 6.8 6.0 - 8.5 g/dL   Albumin 4.3 3.9 - 4.9 g/dL   Globulin, Total 2.5 1.5 - 4.5 g/dL   Albumin/Globulin Ratio 1.7 1.2 - 2.2   Bilirubin Total 0.3 0.0 - 1.2 mg/dL   Alkaline Phosphatase 74 44 - 121 IU/L   AST 114 (H) 0 - 40 IU/L   ALT 162 (H) 0 - 32 IU/L  Lipase  Result Value Ref Range   Lipase 40 14 - 72 U/L  Amylase  Result Value Ref Range   Amylase 66 31 - 110 U/L      Assessment & Plan:   Problem List Items Addressed This Visit       Endocrine   Type 2 diabetes mellitus with obesity (HCC) - Primary    Chronic, ongoing.  Urine ALB 30 October 2021.  A1c 8.3% on 05/24/22 which is increased from previous 7.5%, pancreatitis presented with [place].  Had tolerated Ozempic well for years until recently.   - At this time will maintain off GLP1 or Mounjaro due to side effects.   - Continue Jardiance and Metformin.  Stop Glipizide as do not feel in long run this is going to offer overall benefit to sugars and concerns for increasing dose and her age >65.   - Start Levemir 10 units at night and educated her on how to increase this and wrote instructions down for this -- if sugars in 3 days are >130 in morning then increase by 3 units, continue this increase every 3 days and MyChart provider next week with sugars. - Check blood sugar 3 times a day and document for provider.   - Eye and foot exam up to date. - Flu shot today and Pneumonia vaccines up to date. Return in 4 weeks.      Relevant Medications   insulin detemir (LEVEMIR FLEXPEN) 100 UNIT/ML FlexPen   Other Visit Diagnoses     Flu vaccine need       Flu  vaccine today,   Relevant Orders   Flu  Vaccine QUAD High Dose(Fluad)        Follow up plan: Return in about 4 weeks (around 07/21/2022) for T2DM.

## 2022-06-25 ENCOUNTER — Encounter: Payer: Self-pay | Admitting: Nurse Practitioner

## 2022-06-28 ENCOUNTER — Encounter: Payer: Self-pay | Admitting: Nurse Practitioner

## 2022-06-29 ENCOUNTER — Encounter: Payer: Self-pay | Admitting: Nurse Practitioner

## 2022-07-06 ENCOUNTER — Other Ambulatory Visit: Payer: Self-pay | Admitting: Nurse Practitioner

## 2022-07-06 DIAGNOSIS — Z1231 Encounter for screening mammogram for malignant neoplasm of breast: Secondary | ICD-10-CM

## 2022-07-18 ENCOUNTER — Other Ambulatory Visit: Payer: Self-pay | Admitting: Nurse Practitioner

## 2022-07-19 NOTE — Telephone Encounter (Signed)
Jardiance too soon to refill last RF 05/26/22 #90 Ozempic dc'd 12/14/21 J. Cannady NP HCTZ last RF 03/04/22 #90 1 RF  Requested Prescriptions  Refused Prescriptions Disp Refills  . JARDIANCE 25 MG TABS tablet [Pharmacy Med Name: JARDIANCE 25 MG TABLET] 90 tablet 0    Sig: TAKE 1 TABLET (25 MG TOTAL) BY MOUTH DAILY.     Endocrinology:  Diabetes - SGLT2 Inhibitors Failed - 07/18/2022  9:00 AM      Failed - HBA1C is between 0 and 7.9 and within 180 days    HB A1C (BAYER DCA - WAIVED)  Date Value Ref Range Status  03/05/2022 8.9 (H) 4.8 - 5.6 % Final    Comment:             Prediabetes: 5.7 - 6.4          Diabetes: >6.4          Glycemic control for adults with diabetes: <7.0    Hgb A1c MFr Bld  Date Value Ref Range Status  05/24/2022 8.3 (H) 4.8 - 5.6 % Final    Comment:    (NOTE) Pre diabetes:          5.7%-6.4%  Diabetes:              >6.4%  Glycemic control for   <7.0% adults with diabetes          Passed - Cr in normal range and within 360 days    Creatinine, Ser  Date Value Ref Range Status  05/26/2022 0.87 0.57 - 1.00 mg/dL Final         Passed - eGFR in normal range and within 360 days    GFR calc Af Amer  Date Value Ref Range Status  05/08/2020 95 >59 mL/min/1.73 Final    Comment:    **[facility] currently reports eGFR in compliance with the current**   recommendations of the [employer]. [facility] will   update reporting as new guidelines are published from the NKF-ASN   Task force.    GFR, Estimated  Date Value Ref Range Status  05/24/2022 >60 >60 mL/min Final    Comment:    (NOTE) Calculated using the CKD-EPI Creatinine Equation (2021)    eGFR  Date Value Ref Range Status  05/26/2022 72 >59 mL/min/1.73 Final         Passed - Valid encounter within last 6 months    Recent Outpatient Visits          3 weeks ago Type 2 diabetes mellitus with obesity ([place])   [place], Jolene T, NP   1 month ago Drug-induced acute  pancreatitis without infection or necrosis   [facility] [place], [place] T, NP   4 months ago Type 2 diabetes mellitus with obesity ([place])   [place], Jolene T, NP   6 months ago Type 2 diabetes mellitus with obesity ([place])   [place], Jolene T, NP   7 months ago Type 2 diabetes mellitus with obesity ([place])   [place], Barbaraann Faster, NP      Future Appointments            In 1 week Cannady, Barbaraann Faster, NP [facility]           . OZEMPIC, 2 MG/DOSE, 8 MG/3ML SOPN [Pharmacy Med Name: OZEMPIC 2 MG/DOSE (8 MG/3 ML)]  4    Sig: INJECT 2 MG AS DIRECTED ONCE A  WEEK.     Endocrinology:  Diabetes - GLP-1 Receptor Agonists - semaglutide Failed - 07/18/2022  9:00 AM      Failed - HBA1C in normal range and within 180 days    HB A1C (BAYER DCA - WAIVED)  Date Value Ref Range Status  03/05/2022 8.9 (H) 4.8 - 5.6 % Final    Comment:             Prediabetes: 5.7 - 6.4          Diabetes: >6.4          Glycemic control for adults with diabetes: <7.0    Hgb A1c MFr Bld  Date Value Ref Range Status  05/24/2022 8.3 (H) 4.8 - 5.6 % Final    Comment:    (NOTE) Pre diabetes:          5.7%-6.4%  Diabetes:              >6.4%  Glycemic control for   <7.0% adults with diabetes          Passed - Cr in normal range and within 360 days    Creatinine, Ser  Date Value Ref Range Status  05/26/2022 0.87 0.57 - 1.00 mg/dL Final         Passed - Valid encounter within last 6 months    Recent Outpatient Visits          3 weeks ago Type 2 diabetes mellitus with obesity ([place])   [place], Jolene T, NP   1 month ago Drug-induced acute pancreatitis without infection or necrosis   [facility] [place], [place] T, NP   4 months ago Type 2 diabetes mellitus with obesity ([place])   [place], Jolene T, NP   6 months ago Type 2 diabetes mellitus with  obesity ([place])   [place], Jolene T, NP   7 months ago Type 2 diabetes mellitus with obesity ([place])   [place], Barbaraann Faster, NP      Future Appointments            In 1 week Cannady, Barbaraann Faster, NP [facility]           . hydrochlorothiazide (HYDRODIURIL) 25 MG tablet [Pharmacy Med Name: HYDROCHLOROTHIAZIDE 25 MG TAB] 90 tablet 1    Sig: TAKE 1 TABLET (25 MG TOTAL) BY MOUTH DAILY.     Cardiovascular: Diuretics - Thiazide Passed - 07/18/2022  9:00 AM      Passed - Cr in normal range and within 180 days    Creatinine, Ser  Date Value Ref Range Status  05/26/2022 0.87 0.57 - 1.00 mg/dL Final         Passed - K in normal range and within 180 days    Potassium  Date Value Ref Range Status  05/26/2022 4.0 3.5 - 5.2 mmol/L Final         Passed - Na in normal range and within 180 days    Sodium  Date Value Ref Range Status  05/26/2022 138 134 - 144 mmol/L Final         Passed - Last BP in normal range    BP Readings from Last 1 Encounters:  06/23/22 114/68         Passed - Valid encounter within last 6 months    Recent Outpatient Visits          3 weeks ago Type 2 diabetes mellitus  with obesity ([place])   [place], [place] T, NP   1 month ago Drug-induced acute pancreatitis without infection or necrosis   [facility] [place], [place] T, NP   4 months ago Type 2 diabetes mellitus with obesity ([place])   [place], Jolene T, NP   6 months ago Type 2 diabetes mellitus with obesity ([place])   [place], Jolene T, NP   7 months ago Type 2 diabetes mellitus with obesity ([place])   [place], Barbaraann Faster, NP      Future Appointments            In 1 week Cannady, Barbaraann Faster, NP [facility]

## 2022-07-23 ENCOUNTER — Encounter: Payer: Self-pay | Admitting: Nurse Practitioner

## 2022-07-25 NOTE — Patient Instructions (Signed)
Diabetes Mellitus Basics  Diabetes mellitus, or diabetes, is a long-term (chronic) disease. It occurs when the body does not properly use sugar (glucose) that is released from food after you eat. Diabetes mellitus may be caused by one or both of these problems: Your pancreas does not make enough of a hormone called insulin. Your body does not react in a normal way to the insulin that it makes. Insulin lets glucose enter cells in your body. This gives you energy. If you have diabetes, glucose cannot get into cells. This causes high blood glucose (hyperglycemia). How to treat and manage diabetes You may need to take insulin or other diabetes medicines daily to keep your glucose in balance. If you are prescribed insulin, you will learn how to give yourself insulin by injection. You may need to adjust the amount of insulin you take based on the foods that you eat. You will need to check your blood glucose levels using a glucose monitor as told by your health care provider. The readings can help determine if you have low or high blood glucose. Generally, you should have these blood glucose levels: Before meals (preprandial): 80-130 mg/dL (4.4-7.2 mmol/L). After meals (postprandial): below 180 mg/dL (10 mmol/L). Hemoglobin A1c (HbA1c) level: less than 7%. Your health care provider will set treatment goals for you. Keep all follow-up visits. This is important. Follow these instructions at home: Diabetes medicines Take your diabetes medicines every day as told by your health care provider. List your diabetes medicines here: Name of medicine: ______________________________ Amount (dose): _______________ Time (a.m./p.m.): _______________ Notes: ___________________________________ Name of medicine: ______________________________ Amount (dose): _______________ Time (a.m./p.m.): _______________ Notes: ___________________________________ Name of medicine: ______________________________ Amount (dose):  _______________ Time (a.m./p.m.): _______________ Notes: ___________________________________ Insulin If you use insulin, list the types of insulin you use here: Insulin type: ______________________________ Amount (dose): _______________ Time (a.m./p.m.): _______________Notes: ___________________________________ Insulin type: ______________________________ Amount (dose): _______________ Time (a.m./p.m.): _______________ Notes: ___________________________________ Insulin type: ______________________________ Amount (dose): _______________ Time (a.m./p.m.): _______________ Notes: ___________________________________ Insulin type: ______________________________ Amount (dose): _______________ Time (a.m./p.m.): _______________ Notes: ___________________________________ Insulin type: ______________________________ Amount (dose): _______________ Time (a.m./p.m.): _______________ Notes: ___________________________________ Managing blood glucose  Check your blood glucose levels using a glucose monitor as told by your health care provider. Write down the times that you check your glucose levels here: Time: _______________ Notes: ___________________________________ Time: _______________ Notes: ___________________________________ Time: _______________ Notes: ___________________________________ Time: _______________ Notes: ___________________________________ Time: _______________ Notes: ___________________________________ Time: _______________ Notes: ___________________________________  Low blood glucose Low blood glucose (hypoglycemia) is when glucose is at or below 70 mg/dL (3.9 mmol/L). Symptoms may include: Feeling: Hungry. Sweaty and clammy. Irritable or easily upset. Dizzy. Sleepy. Having: A fast heartbeat. A headache. A change in your vision. Numbness around the mouth, lips, or tongue. Having trouble with: Moving (coordination). Sleeping. Treating low blood glucose To treat low blood  glucose, eat or drink something containing sugar right away. If you can think clearly and swallow safely, follow the 15:15 rule: Take 15 grams of a fast-acting carb (carbohydrate), as told by your health care provider. Some fast-acting carbs are: Glucose tablets: take 3-4 tablets. Hard candy: eat 3-5 pieces. Fruit juice: drink 4 oz (120 mL). Regular (not diet) soda: drink 4-6 oz (120-180 mL). Honey or sugar: eat 1 Tbsp (15 mL). Check your blood glucose levels 15 minutes after you take the carb. If your glucose is still at or below 70 mg/dL (3.9 mmol/L), take 15 grams of a carb again. If your glucose does not go above 70 mg/dL (3.9 mmol/L) after   3 tries, get help right away. After your glucose goes back to normal, eat a meal or a snack within 1 hour. Treating very low blood glucose If your glucose is at or below 54 mg/dL (3 mmol/L), you have very low blood glucose (severe hypoglycemia). This is an emergency. Do not wait to see if the symptoms will go away. Get medical help right away. Call your local emergency services (911 in the [place] Do not drive yourself to the hospital. Questions to ask your health care provider Should I talk with a diabetes educator? What equipment will I need to care for myself at home? What diabetes medicines do I need? When should I take them? How often do I need to check my blood glucose levels? What number can I call if I have questions? When is my follow-up visit? Where can I find a support group for people with diabetes? Where to find more information American Diabetes Association: www.diabetes.org Association of Diabetes Care and Education Specialists: www.diabeteseducator.org Contact a health care provider if: Your blood glucose is at or above 240 mg/dL (13.3 mmol/L) for 2 days in a row. You have been sick or have had a fever for 2 days or more, and you are not getting better. You have any of these problems for more than 6 hours: You cannot eat or  drink. You feel nauseous. You vomit. You have diarrhea. Get help right away if: Your blood glucose is lower than 54 mg/dL (3 mmol/L). You get confused. You have trouble thinking clearly. You have trouble breathing. These symptoms may represent a serious problem that is an emergency. Do not wait to see if the symptoms will go away. Get medical help right away. Call your local emergency services (911 in the [place] Do not drive yourself to the hospital. Summary Diabetes mellitus is a chronic disease that occurs when the body does not properly use sugar (glucose) that is released from food after you eat. Take insulin and diabetes medicines as told. Check your blood glucose every day, as often as told. Keep all follow-up visits. This is important. This information is not intended to replace advice given to you by your health care provider. Make sure you discuss any questions you have with your health care provider. Document Revised: 01/08/2020 Document Reviewed: 01/08/2020 Elsevier Patient Education  2023 [employer] Inc[employer]  

## 2022-07-26 MED ORDER — LEVEMIR FLEXPEN 100 UNIT/ML ~~LOC~~ SOPN: 40.0000 [IU] | PEN_INJECTOR | Freq: Every day | SUBCUTANEOUS | 11 refills | Status: DC

## 2022-07-29 ENCOUNTER — Encounter: Payer: Self-pay | Admitting: Nurse Practitioner

## 2022-07-29 ENCOUNTER — Ambulatory Visit (INDEPENDENT_AMBULATORY_CARE_PROVIDER_SITE_OTHER): Payer: Medicare Other | Admitting: Nurse Practitioner

## 2022-07-29 VITALS — BP 133/65 | HR 65 | Temp 97.6°F | Ht 62.0 in | Wt 164.7 lb

## 2022-07-29 DIAGNOSIS — E1169 Type 2 diabetes mellitus with other specified complication: Secondary | ICD-10-CM | POA: Diagnosis not present

## 2022-07-29 DIAGNOSIS — E669 Obesity, unspecified: Secondary | ICD-10-CM

## 2022-07-29 NOTE — Assessment & Plan Note (Signed)
Chronic, ongoing.  Urine ALB 30 October 2021.  A1c 8.3% on 05/24/22 which is increased from previous 7.5%, pancreatitis presented with [place].  Had tolerated Ozempic well for years until recently.   - At this time will maintain off GLP1 or Mounjaro due to side effects.   - Continue Jardiance and Metformin.  Continue Levemir 36 units -- if sugars in 3 days are >130 in morning then increase by 3 units, continue this increase every 3 days and MyChart provider next week with sugars. - Check blood sugar 3 times a day and document for provider.   - Eye and foot exam up to date. - Flu shot and Pneumonia vaccines up to date. Return in 4 weeks.

## 2022-07-29 NOTE — Progress Notes (Signed)
BP 133/65   Pulse 65   Temp 97.6 F (36.4 C) (Oral)   Ht _0  (1.575 m)   Wt 164 lb 11.2 oz (74.7 kg)   LMP 07/13/2000 (Approximate)   SpO2 97%   BMI 30.12 kg/m    Subjective:    Patient ID: Charlene Juarez, female    DOB: 1951/03/16, 71 y.o.   MRN: [id number]  HPI: Charlene Juarez is a 71 y.o. female  Chief Complaint  Patient presents with   Diabetes    Patient is here for four week follow up on Diabetes. Patient says she is having to watch her diet as she says the first couple of days of being off the medication. Patient says she just wanted to eat.    DIABETES Currently taking Jardiance, Metformin and at recent visit we stopped Glipizide and added on Levemir long acting insulin due to A1c elevation last check at 8.3% and no benefit from addition of Glipizide.  Recent pancreatitis with Ozempic and Mounjaro.  She is working on diet changes, but reports she could be doing better.  Has been taking Levemir in the morning vs at night.  She is taking 36 units at this time of Levemir.   Hypoglycemic episodes:no Polydipsia/polyuria: yes Visual disturbance: no Chest pain: no Paresthesias: no Glucose Monitoring: yes             Accucheck frequency: BID -- ranges 140 to 160 at this time             Fasting glucose:              Post prandial:             Evening:             Before meals: Taking Insulin?: no             Long acting insulin:             Short acting insulin: Blood Pressure Monitoring: daily Retinal Examination: Up to Date Foot Exam: Up to Date Pneumovax: Up to Date Influenza: Up to Date Aspirin: no   Relevant past medical, surgical, family and social history reviewed and updated as indicated. Interim medical history since our last visit reviewed. Allergies and medications reviewed and updated.  Review of Systems  Constitutional:  Negative for activity change, appetite change, diaphoresis, fatigue and fever.  Respiratory:  Negative for  cough, chest tightness, shortness of breath and wheezing.   Cardiovascular:  Negative for chest pain, palpitations and leg swelling.  Endocrine: Positive for polyuria. Negative for polydipsia and polyphagia.  Neurological: Negative.   Psychiatric/Behavioral: Negative.     Per HPI unless specifically indicated above     Objective:    BP 133/65   Pulse 65   Temp 97.6 F (36.4 C) (Oral)   Ht _1  (1.575 m)   Wt 164 lb 11.2 oz (74.7 kg)   LMP 07/13/2000 (Approximate)   SpO2 97%   BMI 30.12 kg/m   Wt Readings from Last 3 Encounters:  07/29/22 164 lb 11.2 oz (74.7 kg)  06/23/22 161 lb 8 oz (73.3 kg)  05/26/22 160 lb 6.4 oz (72.8 kg)    Physical Exam Vitals and nursing note reviewed.  Constitutional:      General: She is awake. She is not in acute distress.    Appearance: She is well-developed and well-groomed. She is obese. She is not ill-appearing or toxic-appearing.  HENT:     Head: Normocephalic.  Eyes:     General: Lids are normal. No scleral icterus.    Pupils: Pupils are equal, round, and reactive to light.  Neck:     Thyroid: No thyromegaly.     Vascular: No carotid bruit.  Cardiovascular:     Rate and Rhythm: Normal rate and regular rhythm.     Pulses: Normal pulses.     Heart sounds: Normal heart sounds. No murmur heard.    No gallop.  Pulmonary:     Effort: Pulmonary effort is normal. No accessory muscle usage or respiratory distress.     Breath sounds: Normal breath sounds.  Abdominal:     General: Bowel sounds are normal. There is no distension.     Palpations: Abdomen is soft.  Musculoskeletal:     Cervical back: Full passive range of motion without pain.     Right lower leg: No edema.     Left lower leg: No edema.  Skin:    General: Skin is warm and dry.  Neurological:     Mental Status: She is alert.  Psychiatric:        Attention and Perception: Attention normal.        Mood and Affect: Mood normal.        Speech: Speech normal.         Behavior: Behavior is cooperative.        Thought Content: Thought content normal.    Results for orders placed or performed in visit on 05/26/22  Comprehensive metabolic panel  Result Value Ref Range   Glucose 115 (H) 70 - 99 mg/dL   BUN 22 8 - 27 mg/dL   Creatinine, Ser 0.87 0.57 - 1.00 mg/dL   eGFR 72 >59 mL/min/1.73   BUN/Creatinine Ratio 25 12 - 28   Sodium 138 134 - 144 mmol/L   Potassium 4.0 3.5 - 5.2 mmol/L   Chloride 99 96 - 106 mmol/L   CO2 23 20 - 29 mmol/L   Calcium 10.0 8.7 - 10.3 mg/dL   Total Protein 6.8 6.0 - 8.5 g/dL   Albumin 4.3 3.9 - 4.9 g/dL   Globulin, Total 2.5 1.5 - 4.5 g/dL   Albumin/Globulin Ratio 1.7 1.2 - 2.2   Bilirubin Total 0.3 0.0 - 1.2 mg/dL   Alkaline Phosphatase 74 44 - 121 IU/L   AST 114 (H) 0 - 40 IU/L   ALT 162 (H) 0 - 32 IU/L  Lipase  Result Value Ref Range   Lipase 40 14 - 72 U/L  Amylase  Result Value Ref Range   Amylase 66 31 - 110 U/L      Assessment & Plan:   Problem List Items Addressed This Visit       Endocrine   Type 2 diabetes mellitus with obesity (HCC) - Primary    Chronic, ongoing.  Urine ALB 30 October 2021.  A1c 8.3% on 05/24/22 which is increased from previous 7.5%, pancreatitis presented with [place].  Had tolerated Ozempic well for years until recently.   - At this time will maintain off GLP1 or Mounjaro due to side effects.   - Continue Jardiance and Metformin.  Continue Levemir 36 units -- if sugars in 3 days are >130 in morning then increase by 3 units, continue this increase every 3 days and MyChart provider next week with sugars. - Check blood sugar 3 times a day and document for provider.   - Eye and foot exam up to date. - Flu shot and Pneumonia  vaccines up to date. Return in 4 weeks.        Follow up plan: Return in about 25 days (around 08/23/2022) for T2DM, HTN/HLD, GERD, ELEVATED LFTs.

## 2022-08-05 ENCOUNTER — Ambulatory Visit
Admission: RE | Admit: 2022-08-05 | Discharge: 2022-08-05 | Disposition: A | Discharge status: Home / Self Care | Payer: Medicare Other | Source: Ambulatory Visit | Attending: Nurse Practitioner | Admitting: Nurse Practitioner

## 2022-08-05 DIAGNOSIS — Z1231 Encounter for screening mammogram for malignant neoplasm of breast: Secondary | ICD-10-CM | POA: Insufficient documentation

## 2022-08-09 NOTE — Progress Notes (Signed)
Contacted via MyChart   Normal mammogram, may repeat in one year:)

## 2022-08-22 NOTE — Patient Instructions (Signed)
Diabetes Mellitus Basics  Diabetes mellitus, or diabetes, is a long-term (chronic) disease. It occurs when the body does not properly use sugar (glucose) that is released from food after you eat. Diabetes mellitus may be caused by one or both of these problems: Your pancreas does not make enough of a hormone called insulin. Your body does not react in a normal way to the insulin that it makes. Insulin lets glucose enter cells in your body. This gives you energy. If you have diabetes, glucose cannot get into cells. This causes high blood glucose (hyperglycemia). How to treat and manage diabetes You may need to take insulin or other diabetes medicines daily to keep your glucose in balance. If you are prescribed insulin, you will learn how to give yourself insulin by injection. You may need to adjust the amount of insulin you take based on the foods that you eat. You will need to check your blood glucose levels using a glucose monitor as told by your health care provider. The readings can help determine if you have low or high blood glucose. Generally, you should have these blood glucose levels: Before meals (preprandial): 80-130 mg/dL (4.4-7.2 mmol/L). After meals (postprandial): below 180 mg/dL (10 mmol/L). Hemoglobin A1c (HbA1c) level: less than 7%. Your health care provider will set treatment goals for you. Keep all follow-up visits. This is important. Follow these instructions at home: Diabetes medicines Take your diabetes medicines every day as told by your health care provider. List your diabetes medicines here: Name of medicine: ______________________________ Amount (dose): _______________ Time (a.m./p.m.): _______________ Notes: ___________________________________ Name of medicine: ______________________________ Amount (dose): _______________ Time (a.m./p.m.): _______________ Notes: ___________________________________ Name of medicine: ______________________________ Amount (dose):  _______________ Time (a.m./p.m.): _______________ Notes: ___________________________________ Insulin If you use insulin, list the types of insulin you use here: Insulin type: ______________________________ Amount (dose): _______________ Time (a.m./p.m.): _______________Notes: ___________________________________ Insulin type: ______________________________ Amount (dose): _______________ Time (a.m./p.m.): _______________ Notes: ___________________________________ Insulin type: ______________________________ Amount (dose): _______________ Time (a.m./p.m.): _______________ Notes: ___________________________________ Insulin type: ______________________________ Amount (dose): _______________ Time (a.m./p.m.): _______________ Notes: ___________________________________ Insulin type: ______________________________ Amount (dose): _______________ Time (a.m./p.m.): _______________ Notes: ___________________________________ Managing blood glucose  Check your blood glucose levels using a glucose monitor as told by your health care provider. Write down the times that you check your glucose levels here: Time: _______________ Notes: ___________________________________ Time: _______________ Notes: ___________________________________ Time: _______________ Notes: ___________________________________ Time: _______________ Notes: ___________________________________ Time: _______________ Notes: ___________________________________ Time: _______________ Notes: ___________________________________  Low blood glucose Low blood glucose (hypoglycemia) is when glucose is at or below 70 mg/dL (3.9 mmol/L). Symptoms may include: Feeling: Hungry. Sweaty and clammy. Irritable or easily upset. Dizzy. Sleepy. Having: A fast heartbeat. A headache. A change in your vision. Numbness around the mouth, lips, or tongue. Having trouble with: Moving (coordination). Sleeping. Treating low blood glucose To treat low blood  glucose, eat or drink something containing sugar right away. If you can think clearly and swallow safely, follow the 15:15 rule: Take 15 grams of a fast-acting carb (carbohydrate), as told by your health care provider. Some fast-acting carbs are: Glucose tablets: take 3-4 tablets. Hard candy: eat 3-5 pieces. Fruit juice: drink 4 oz (120 mL). Regular (not diet) soda: drink 4-6 oz (120-180 mL). Honey or sugar: eat 1 Tbsp (15 mL). Check your blood glucose levels 15 minutes after you take the carb. If your glucose is still at or below 70 mg/dL (3.9 mmol/L), take 15 grams of a carb again. If your glucose does not go above 70 mg/dL (3.9 mmol/L) after   3 tries, get help right away. After your glucose goes back to normal, eat a meal or a snack within 1 hour. Treating very low blood glucose If your glucose is at or below 54 mg/dL (3 mmol/L), you have very low blood glucose (severe hypoglycemia). This is an emergency. Do not wait to see if the symptoms will go away. Get medical help right away. Call your local emergency services (911 in the [place] Do not drive yourself to the hospital. Questions to ask your health care provider Should I talk with a diabetes educator? What equipment will I need to care for myself at home? What diabetes medicines do I need? When should I take them? How often do I need to check my blood glucose levels? What number can I call if I have questions? When is my follow-up visit? Where can I find a support group for people with diabetes? Where to find more information American Diabetes Association: www.diabetes.org Association of Diabetes Care and Education Specialists: www.diabeteseducator.org Contact a health care provider if: Your blood glucose is at or above 240 mg/dL (13.3 mmol/L) for 2 days in a row. You have been sick or have had a fever for 2 days or more, and you are not getting better. You have any of these problems for more than 6 hours: You cannot eat or  drink. You feel nauseous. You vomit. You have diarrhea. Get help right away if: Your blood glucose is lower than 54 mg/dL (3 mmol/L). You get confused. You have trouble thinking clearly. You have trouble breathing. These symptoms may represent a serious problem that is an emergency. Do not wait to see if the symptoms will go away. Get medical help right away. Call your local emergency services (911 in the [place] Do not drive yourself to the hospital. Summary Diabetes mellitus is a chronic disease that occurs when the body does not properly use sugar (glucose) that is released from food after you eat. Take insulin and diabetes medicines as told. Check your blood glucose every day, as often as told. Keep all follow-up visits. This is important. This information is not intended to replace advice given to you by your health care provider. Make sure you discuss any questions you have with your health care provider. Document Revised: 01/08/2020 Document Reviewed: 01/08/2020 Elsevier Patient Education  2023 [employer] Inc[employer]  

## 2022-08-24 ENCOUNTER — Other Ambulatory Visit: Payer: Self-pay | Admitting: Nurse Practitioner

## 2022-08-25 NOTE — Telephone Encounter (Signed)
Requested Prescriptions  Pending Prescriptions Disp Refills   JARDIANCE 25 MG TABS tablet [Pharmacy Med Name: JARDIANCE 25 MG TABLET] 90 tablet 0    Sig: TAKE 1 TABLET (25 MG TOTAL) BY MOUTH DAILY.     Endocrinology:  Diabetes - SGLT2 Inhibitors Failed - 08/24/2022  7:21 PM      Failed - HBA1C is between 0 and 7.9 and within 180 days    HB A1C (BAYER DCA - WAIVED)  Date Value Ref Range Status  03/05/2022 8.9 (H) 4.8 - 5.6 % Final    Comment:             Prediabetes: 5.7 - 6.4          Diabetes: >6.4          Glycemic control for adults with diabetes: <7.0    Hgb A1c MFr Bld  Date Value Ref Range Status  05/24/2022 8.3 (H) 4.8 - 5.6 % Final    Comment:    (NOTE) Pre diabetes:          5.7%-6.4%  Diabetes:              >6.4%  Glycemic control for   <7.0% adults with diabetes          Passed - Cr in normal range and within 360 days    Creatinine, Ser  Date Value Ref Range Status  05/26/2022 0.87 0.57 - 1.00 mg/dL Final         Passed - eGFR in normal range and within 360 days    GFR calc Af Amer  Date Value Ref Range Status  05/08/2020 95 >59 mL/min/1.73 Final    Comment:    **[facility] currently reports eGFR in compliance with the current**   recommendations of the [employer]. [facility] will   update reporting as new guidelines are published from the NKF-ASN   Task force.    GFR, Estimated  Date Value Ref Range Status  05/24/2022 >60 >60 mL/min Final    Comment:    (NOTE) Calculated using the CKD-EPI Creatinine Equation (2021)    eGFR  Date Value Ref Range Status  05/26/2022 72 >59 mL/min/1.73 Final         Passed - Valid encounter within last 6 months    Recent Outpatient Visits           3 weeks ago Type 2 diabetes mellitus with obesity ([place])   [place], Jolene T, NP   2 months ago Type 2 diabetes mellitus with obesity ([place])   [place], [place] T, NP   3 months ago Drug-induced acute  pancreatitis without infection or necrosis   [facility] [place], [place] T, NP   5 months ago Type 2 diabetes mellitus with obesity ([place])   [place], Jolene T, NP   8 months ago Type 2 diabetes mellitus with obesity ([place])   [place], Barbaraann Faster, NP       Future Appointments             Tomorrow Cannady, Henrine Screws T, NP [employer], [facility]             hydrochlorothiazide (HYDRODIURIL) 25 MG tablet [Pharmacy Med Name: HYDROCHLOROTHIAZIDE 25 MG TAB] 90 tablet 0    Sig: TAKE 1 TABLET (25 MG TOTAL) BY MOUTH DAILY.     Cardiovascular: Diuretics - Thiazide Passed - 08/24/2022  7:21 PM      Passed -  Cr in normal range and within 180 days    Creatinine, Ser  Date Value Ref Range Status  05/26/2022 0.87 0.57 - 1.00 mg/dL Final         Passed - K in normal range and within 180 days    Potassium  Date Value Ref Range Status  05/26/2022 4.0 3.5 - 5.2 mmol/L Final         Passed - Na in normal range and within 180 days    Sodium  Date Value Ref Range Status  05/26/2022 138 134 - 144 mmol/L Final         Passed - Last BP in normal range    BP Readings from Last 1 Encounters:  07/29/22 133/65         Passed - Valid encounter within last 6 months    Recent Outpatient Visits           3 weeks ago Type 2 diabetes mellitus with obesity ([place])   [place], Jolene T, NP   2 months ago Type 2 diabetes mellitus with obesity ([place])   [place], [place] T, NP   3 months ago Drug-induced acute pancreatitis without infection or necrosis   [facility] [place], Jolene T, NP   5 months ago Type 2 diabetes mellitus with obesity ([place])   [place], Jolene T, NP   8 months ago Type 2 diabetes mellitus with obesity ([place])   [place], Barbaraann Faster, NP       Future Appointments             Tomorrow Venita Lick, NP  [place]

## 2022-08-26 ENCOUNTER — Encounter: Payer: Self-pay | Admitting: Nurse Practitioner

## 2022-08-26 ENCOUNTER — Telehealth: Payer: Self-pay | Admitting: Nurse Practitioner

## 2022-08-26 ENCOUNTER — Ambulatory Visit (INDEPENDENT_AMBULATORY_CARE_PROVIDER_SITE_OTHER): Payer: Medicare Other | Admitting: Nurse Practitioner

## 2022-08-26 ENCOUNTER — Other Ambulatory Visit: Payer: Self-pay | Admitting: Nurse Practitioner

## 2022-08-26 VITALS — BP 133/71 | HR 65 | Temp 97.3°F | Ht 62.01 in | Wt 163.6 lb

## 2022-08-26 DIAGNOSIS — E119 Type 2 diabetes mellitus without complications: Secondary | ICD-10-CM

## 2022-08-26 DIAGNOSIS — E669 Obesity, unspecified: Secondary | ICD-10-CM | POA: Diagnosis not present

## 2022-08-26 DIAGNOSIS — E66811 Obesity, class 1: Secondary | ICD-10-CM

## 2022-08-26 DIAGNOSIS — I152 Hypertension secondary to endocrine disorders: Secondary | ICD-10-CM

## 2022-08-26 DIAGNOSIS — E1169 Type 2 diabetes mellitus with other specified complication: Secondary | ICD-10-CM

## 2022-08-26 DIAGNOSIS — Z6830 Body mass index (BMI) 30.0-30.9, adult: Secondary | ICD-10-CM

## 2022-08-26 DIAGNOSIS — J4520 Mild intermittent asthma, uncomplicated: Secondary | ICD-10-CM

## 2022-08-26 DIAGNOSIS — R8281 Pyuria: Secondary | ICD-10-CM | POA: Diagnosis not present

## 2022-08-26 DIAGNOSIS — G4733 Obstructive sleep apnea (adult) (pediatric): Secondary | ICD-10-CM | POA: Insufficient documentation

## 2022-08-26 DIAGNOSIS — K853 Drug induced acute pancreatitis without necrosis or infection: Secondary | ICD-10-CM

## 2022-08-26 DIAGNOSIS — K760 Fatty (change of) liver, not elsewhere classified: Secondary | ICD-10-CM

## 2022-08-26 DIAGNOSIS — E1159 Type 2 diabetes mellitus with other circulatory complications: Secondary | ICD-10-CM | POA: Diagnosis not present

## 2022-08-26 DIAGNOSIS — Z794 Long term (current) use of insulin: Secondary | ICD-10-CM

## 2022-08-26 DIAGNOSIS — E785 Hyperlipidemia, unspecified: Secondary | ICD-10-CM | POA: Diagnosis not present

## 2022-08-26 DIAGNOSIS — E559 Vitamin D deficiency, unspecified: Secondary | ICD-10-CM | POA: Diagnosis not present

## 2022-08-26 DIAGNOSIS — R7989 Other specified abnormal findings of blood chemistry: Secondary | ICD-10-CM

## 2022-08-26 DIAGNOSIS — E6609 Other obesity due to excess calories: Secondary | ICD-10-CM | POA: Diagnosis not present

## 2022-08-26 DIAGNOSIS — M85851 Other specified disorders of bone density and structure, right thigh: Secondary | ICD-10-CM | POA: Diagnosis not present

## 2022-08-26 DIAGNOSIS — R300 Dysuria: Secondary | ICD-10-CM

## 2022-08-26 LAB — URINALYSIS, ROUTINE W REFLEX MICROSCOPIC
Bilirubin, UA: NEGATIVE
Ketones, UA: NEGATIVE
Nitrite, UA: POSITIVE — AB
Protein,UA: NEGATIVE
Specific Gravity, UA: 1.015 (ref 1.005–1.030)
Urobilinogen, Ur: 0.2 mg/dL (ref 0.2–1.0)
pH, UA: 5 (ref 5.0–7.5)

## 2022-08-26 LAB — MICROSCOPIC EXAMINATION

## 2022-08-26 LAB — BAYER DCA HB A1C WAIVED: HB A1C (BAYER DCA - WAIVED): 9.3 % — ABNORMAL HIGH (ref 4.8–5.6)

## 2022-08-26 MED ORDER — DEXCOM G6 TRANSMITTER MISC: 4 refills | Status: DC

## 2022-08-26 MED ORDER — INSULIN LISPRO (1 UNIT DIAL) 100 UNIT/ML (KWIKPEN): 5.0000 [IU] | PEN_INJECTOR | Freq: Three times a day (TID) | SUBCUTANEOUS | 4 refills | Status: DC

## 2022-08-26 MED ORDER — AMOXICILLIN-POT CLAVULANATE 875-125 MG PO TABS: 1.0000 | ORAL_TABLET | Freq: Two times a day (BID) | ORAL | 0 refills | Status: AC

## 2022-08-26 MED ORDER — INSULIN PEN NEEDLE 31G X 8 MM MISC: 4 refills | Status: DC

## 2022-08-26 MED ORDER — FLUCONAZOLE 150 MG PO TABS: 150.0000 mg | ORAL_TABLET | Freq: Once | ORAL | 0 refills | Status: AC

## 2022-08-26 MED ORDER — LEVEMIR FLEXPEN 100 UNIT/ML ~~LOC~~ SOPN: 50.0000 [IU] | PEN_INJECTOR | Freq: Every day | SUBCUTANEOUS | 11 refills | Status: DC

## 2022-08-26 MED ORDER — DEXCOM G6 SENSOR MISC: 5 refills | Status: DC

## 2022-08-26 NOTE — Assessment & Plan Note (Signed)
Chronic.  Noted on Dexa in 2020.  Repeat Dexa 2025.  Continue daily Vit D and calcium. Check levels today. 

## 2022-08-26 NOTE — Assessment & Plan Note (Signed)
Chronic, stable.  BP at goal in office and at home.  Continue Telmisartan for kidney protection, and adjust as needed, but stop HCTZ as could diminish effects of insulin == may need to consider Amlodipine if BP trends up in future.  LABS: CMP.  Recommend she continue to check BP at home on occasion and document for visits. Focus on DASH diet.  Return in 4 weeks.

## 2022-08-26 NOTE — Assessment & Plan Note (Signed)
Acute for weeks with UA noting abnormals.  Will start Augmentin BID for 5 days.  Instructed her on this.  Sent Diflucan for precaution in case yeast with abx therapy.  Recommend increase fluid intake.  If ongoing symptoms alert provider.  Urine for culture and if need to change regimen will alert her.

## 2022-08-26 NOTE — Assessment & Plan Note (Signed)
Chronic, stable.  Continue current medication regimen and adjust as needed.  Will send refills when requested as minimally uses inhalers. 

## 2022-08-26 NOTE — Telephone Encounter (Signed)
Copied from [place] [id number]. Topic: General - Inquiry >> Aug 26, 2022  4:21 PM Leilani Able wrote: Reason for CRM:  Disp Refills Start End  Continuous Blood Gluc Transmit (DEXCOM G6 TRANSMITTER) MISC 1 each 4 08/26/2022   Sig: Use to check sugars 4-5 times a day -- insulin dependent.   Pt has been in to purchase this and pharmacy states medicare will not cover and pt does not have over $400.00 to spend on this. Pt wants to reach back out to [facility] re this to see if there is anyway she could still get if maybe she could appeal to the ins company. Please advise [phone] Any help would be appreciated.

## 2022-08-26 NOTE — Assessment & Plan Note (Signed)
Acute and improved, suspect cause Mounjaro and previous GLP1 therapy.  Had tolerated Ozempic well for years.  At this time will maintain off GLP1 or Mounjaro due to side effects.  Labs today: CMP, Lipase, Amylase.

## 2022-08-26 NOTE — Assessment & Plan Note (Signed)
Chronic, ongoing.  Urine ALB 30 October 2021.  A1c 9.3% today which is increased from previous 8.9%, pancreatitis presented with [place].  Had tolerated Ozempic well for years until recently.   - At this time will maintain off GLP1 or Mounjaro due to side effects.   - Continue Jardiance and Metformin.  Increase Levemir to 50 units.  Will start Novolog pre-meals as suspect main elevations after meals, educated her on this and not to take if does not eat a meal -- discussed to monitor sugars closely. - Ordered DEXCOM, placed a sample on today and instructed her on use, helped her set-up -- would benefit from continuous monitoring due to age and insulin use. - Check blood sugar 4-5 times a day and document for provider.   - Eye and foot exam up to date. - Flu shot and Pneumonia vaccines up to date. Return in 4 weeks.

## 2022-08-26 NOTE — Assessment & Plan Note (Signed)
Recheck CMP today.  °

## 2022-08-26 NOTE — Telephone Encounter (Signed)
Requested medication (s) are due for refill today -no  Requested medication (s) are on the active medication list -yes  Future visit scheduled -yes  Last refill: 08/26/22 106m 4RF  Notes to clinic: Pharmacy request: Alternative or PA required  Requested Prescriptions  Pending Prescriptions Disp Refills   NOVOLOG FLEXPEN 100 UNIT/ML FlexPen [Pharmacy Med Name: NOVOLOG 100 UNIT/ML [place] 0     Endocrinology:  Diabetes - Insulins Failed - 08/26/2022 11:25 AM      Failed - HBA1C is between 0 and 7.9 and within 180 days    HB A1C (BAYER DCA - WAIVED)  Date Value Ref Range Status  08/26/2022 9.3 (H) 4.8 - 5.6 % Final    Comment:             Prediabetes: 5.7 - 6.4          Diabetes: >6.4          Glycemic control for adults with diabetes: <7.0          Passed - Valid encounter within last 6 months    Recent Outpatient Visits           Today Type 2 diabetes mellitus with obesity ([place]   [place] J[place] T, NP   4 weeks ago Type 2 diabetes mellitus with obesity ([place]   [place] J[place] T, NP   2 months ago Type 2 diabetes mellitus with obesity ([place]   [place] Jol[place] T, NP   3 months ago Drug-induced acute pancreatitis without infection or necrosis   [facility] [place] Jolene T, NP   5 months ago Type 2 diabetes mellitus with obesity ([place]   [place] J[place], NP       Future Appointments             In 4 weeks Cannady, JBarbaraann Faster NP [facility]               Requested Prescriptions  Pending Prescriptions Disp Refills   NOVOLOG FLEXPEN 100 UNIT/ML FlexPen [Pharmacy Med Name: NOVOLOG 100 UNIT/ML [place] 0     Endocrinology:  Diabetes - Insulins Failed - 08/26/2022 11:25 AM      Failed - HBA1C is between 0 and 7.9 and within 180 days    HB A1C (BAYER DCA - WAIVED)  Date Value Ref Range Status  08/26/2022 9.3 (H) 4.8 - 5.6 % Final     Comment:             Prediabetes: 5.7 - 6.4          Diabetes: >6.4          Glycemic control for adults with diabetes: <7.0          Passed - Valid encounter within last 6 months    Recent Outpatient Visits           Today Type 2 diabetes mellitus with obesity ([place]   [place] Jol[place] T, NP   4 weeks ago Type 2 diabetes mellitus with obesity ([place]   [place] J[place] T, NP   2 months ago Type 2 diabetes mellitus with obesity ([place]   [place] J[place]T, NP   3 months ago Drug-induced acute pancreatitis without infection or necrosis   [place], NP   5 months ago Type 2 diabetes mellitus with obesity ([place]   [facility] [place], Barbaraann Faster, NP       Future Appointments             In 4 weeks Cannady, Barbaraann Faster, NP [facility], [facility]

## 2022-08-26 NOTE — Assessment & Plan Note (Signed)
Chronic, ongoing.  Will continue Rosuvastatin daily and adjust dose as needed.  Lipid panel today.   

## 2022-08-26 NOTE — Assessment & Plan Note (Signed)
Continue current supplement and plan for DEXA scan repeat in 2025 due to osteopenia..  Check Vit D today. 

## 2022-08-26 NOTE — Assessment & Plan Note (Signed)
Chronic, noted on past imaging. At this time recommend heavy focus on diet and regular activity + working on diabetes control with goal A1c <7%. 

## 2022-08-26 NOTE — Assessment & Plan Note (Signed)
Chronic, ongoing.  Urine ALB 30 October 2021.  A1c 9.3% today which is increased from previous 8.9%, pancreatitis presented with [place].  Had tolerated Ozempic well for years until recently.   - At this time will maintain off GLP1 or Mounjaro due to side effects.   - Continue Jardiance and Metformin.  Increase Levemir to 50 units.  Will start Novolog pre-meals as suspect main elevations after meals, educated her on this and not to take if does not eat a meal -- discussed to monitor sugars closely. - Ordered DEXCOM, placed a sample on today and instructed her on use, helped her set-up -- would benefit from continuous monitoring due to age and insulin use. - Check blood sugar 4-5 times a day and document for provider.   - Eye and foot exam up to date. - Flu shot and Pneumonia vaccines up to date. Return in 4 weeks.

## 2022-08-26 NOTE — Assessment & Plan Note (Signed)
BMI 29.92.  Recommended eating smaller high protein, low fat meals more frequently and exercising 30 mins a day 5 times a week with a goal of 10-15lb weight loss in the next 3 months. Patient voiced their understanding and motivation to adhere to these recommendations.

## 2022-08-26 NOTE — Progress Notes (Signed)
BP 133/71   Pulse 65   Temp (!) 97.3 F (36.3 C) (Oral)   Ht 5' 2.01" (1.575 m)   Wt 163 lb 9.6 oz (74.2 kg)   LMP 07/13/2000 (Approximate)   SpO2 98%   BMI 29.92 kg/m    Subjective:    Patient ID: Charlene Juarez, female    DOB: Nov 07, 1950, 71 y.o.   MRN: [id number]  HPI: Charlene Juarez is a 71 y.o. female  Chief Complaint  Patient presents with   Diabetes   Hypertension   Hyperlipidemia   Gastroesophageal Reflux   DIABETES A1c 8.3% September. Taking Levemir 48 units, Jardiance 25 MG, Metformin 1000 MG BID.  Currently taking Gabapentin 100 MG TID for neuropathy.  Can not take GLP1 or Mounjaro due to pancreatitis with these. Hypoglycemic episodes:no Polydipsia/polyuria: no Visual disturbance: no Chest pain: no Paresthesias: no Glucose Monitoring: no             Accucheck frequency: daily             Fasting glucose: 120 to 140 -- highest 160 range             Post prandial:             Evening: one night got 489             Before meals: Taking Insulin?: no             Long acting insulin:             Short acting insulin: Blood Pressure Monitoring: yes Retinal Examination: Up To Date Foot Exam: Up to Date Pneumovax: Up to Date Influenza: Up to Date Aspirin: no   HYPERTENSION / HYPERLIPIDEMIA Continues on Telmisartan and HCTZ + Crestor. Satisfied with current treatment? yes Duration of hypertension: chronic BP monitoring frequency: a few times a week BP range: 110/70 range at home BP medication side effects: no Duration of hyperlipidemia: chronic Cholesterol medication side effects: no Cholesterol supplements: none Medication compliance: good compliance Aspirin: no Recent stressors: no Recurrent headaches: no Visual changes: no Palpitations: no Dyspnea: no Chest pain: no Lower extremity edema: no Dizzy/lightheaded: no   URINARY SYMPTOMS Started having symptoms a few weeks ago.   Dysuria: burning Urinary frequency:  yes Urgency: yes Small volume voids: yes Symptom severity: yes Urinary incontinence: no Foul odor: no Hematuria: no Abdominal pain: no Back pain: no Suprapubic pain/pressure: yes Flank pain: no Fever:  no Vomiting: no Status: stable Previous urinary tract infection: yes Recurrent urinary tract infection: no Sexual activity: No sexually active History of sexually transmitted disease: no Treatments attempted: increasing fluids    GERD Continues on Pantoprazole.  Heart burn and abdominal pain improved with stopping GLP1. GERD control status: stable Satisfied with current treatment? yes Heartburn frequency: none Medication side effects: no  Medication compliance: fluctuating Dysphagia: no Odynophagia:  no Hematemesis: no Blood in stool: no EGD: yes   ASTHMA Symbicort and Albuterol used only as needed. Asthma status: stable Satisfied with current treatment?: yes Albuterol/rescue inhaler frequency: once a year Dyspnea frequency: no Wheezing frequency: no Cough frequency: no Nocturnal symptom frequency: no Limitation of activity: no Current upper respiratory symptoms: no Aerochamber/spacer use: no Visits to ER or Urgent Care in past year: no Pneumovax: Up to Date Influenza: Up to Date   OSTEOPENIA Last bone density 06/10/2019 with T-score -2.3. Satisfied with current treatment?: yes Adequate calcium & vitamin D: yes Weight bearing exercises: yes    Relevant past medical, surgical,  family and social history reviewed and updated as indicated. Interim medical history since our last visit reviewed. Allergies and medications reviewed and updated.  Review of Systems  Constitutional:  Negative for activity change, appetite change, diaphoresis, fatigue and fever.  Respiratory:  Negative for cough, chest tightness, shortness of breath and wheezing.   Cardiovascular:  Negative for chest pain, palpitations and leg swelling.  Endocrine: Negative for polydipsia, polyphagia  and polyuria.  Neurological: Negative.   Psychiatric/Behavioral: Negative.      Per HPI unless specifically indicated above     Objective:    BP 133/71   Pulse 65   Temp (!) 97.3 F (36.3 C) (Oral)   Ht 5' 2.01" (1.575 m)   Wt 163 lb 9.6 oz (74.2 kg)   LMP 07/13/2000 (Approximate)   SpO2 98%   BMI 29.92 kg/m   Wt Readings from Last 3 Encounters:  08/26/22 163 lb 9.6 oz (74.2 kg)  07/29/22 164 lb 11.2 oz (74.7 kg)  06/23/22 161 lb 8 oz (73.3 kg)    Physical Exam Vitals and nursing note reviewed.  Constitutional:      General: She is awake. She is not in acute distress.    Appearance: She is well-developed and well-groomed. She is obese. She is not ill-appearing or toxic-appearing.  HENT:     Head: Normocephalic.  Eyes:     General: Lids are normal. No scleral icterus.    Pupils: Pupils are equal, round, and reactive to light.  Neck:     Vascular: No carotid bruit.  Cardiovascular:     Rate and Rhythm: Normal rate and regular rhythm.     Pulses: Normal pulses.     Heart sounds: Normal heart sounds. No murmur heard.    No gallop.  Pulmonary:     Effort: Pulmonary effort is normal. No accessory muscle usage or respiratory distress.     Breath sounds: Normal breath sounds.  Abdominal:     General: Bowel sounds are normal. There is no distension.     Palpations: Abdomen is soft. There is no hepatomegaly.     Tenderness: There is abdominal tenderness in the suprapubic area. There is no right CVA tenderness or left CVA tenderness.  Musculoskeletal:     Cervical back: Full passive range of motion without pain.     Right lower leg: No edema.     Left lower leg: No edema.  Skin:    General: Skin is warm and dry.  Neurological:     Mental Status: She is alert.     Deep Tendon Reflexes: Reflexes are normal and symmetric.     Reflex Scores:      Bicep reflexes are 2+ on the right side and 2+ on the left side.      Brachioradialis reflexes are 2+ on the right side and 2+  on the left side. Psychiatric:        Attention and Perception: Attention normal.        Mood and Affect: Mood normal.        Speech: Speech normal.        Behavior: Behavior is cooperative.        Thought Content: Thought content normal.    Results for orders placed or performed in visit on 08/26/22  Microscopic Examination   Urine  Result Value Ref Range   WBC, UA 11-30 (A) 0 - 5 /hpf   RBC, Urine 0-2 0 - 2 /hpf   Epithelial Cells (non renal) 0-10  0 - 10 /hpf   Bacteria, UA Many (A) None seen/Few  Bayer DCA Hb A1c Waived  Result Value Ref Range   HB A1C (BAYER DCA - WAIVED) 9.3 (H) 4.8 - 5.6 %  Urinalysis, Routine w reflex microscopic  Result Value Ref Range   Specific Gravity, UA 1.015 1.005 - 1.030   pH, UA 5.0 5.0 - 7.5   Color, UA Yellow Yellow   Appearance Ur Turbid (A) Clear   Leukocytes,UA 1+ (A) Negative   Protein,UA Negative Negative/Trace   Glucose, UA 3+ (A) Negative   Ketones, UA Negative Negative   RBC, UA Trace (A) Negative   Bilirubin, UA Negative Negative   Urobilinogen, Ur 0.2 0.2 - 1.0 mg/dL   Nitrite, UA Positive (A) Negative   Microscopic Examination See below:       Assessment & Plan:   Problem List Items Addressed This Visit       Cardiovascular and Mediastinum   Hypertension associated with diabetes (HCC)    Chronic, stable.  BP at goal in office and at home.  Continue Telmisartan for kidney protection, and adjust as needed, but stop HCTZ as could diminish effects of insulin == may need to consider Amlodipine if BP trends up in future.  LABS: CMP.  Recommend she continue to check BP at home on occasion and document for visits. Focus on DASH diet.  Return in 4 weeks.      Relevant Medications   insulin detemir (LEVEMIR FLEXPEN) 100 UNIT/ML FlexPen   Other Relevant Orders   Bayer DCA Hb A1c Waived (Completed)   Comprehensive metabolic panel     Respiratory   Asthma, intermittent    Chronic, stable.  Continue current medication regimen and  adjust as needed.  Will send refills when requested as minimally uses inhalers.        Digestive   Acute pancreatitis    Acute and improved, suspect cause Mounjaro and previous GLP1 therapy.  Had tolerated Ozempic well for years.  At this time will maintain off GLP1 or Mounjaro due to side effects.  Labs today: CMP, Lipase, Amylase.        Relevant Orders   Comprehensive metabolic panel   Amylase   Lipase   Fatty liver    Chronic, noted on past imaging. At this time recommend heavy focus on diet and regular activity + working on diabetes control with goal A1c <7%.      Relevant Orders   Comprehensive metabolic panel     Endocrine   Hyperlipidemia associated with type 2 diabetes mellitus (HCC)    Chronic, ongoing.  Will continue Rosuvastatin daily and adjust dose as needed.  Lipid panel today.        Relevant Medications   insulin detemir (LEVEMIR FLEXPEN) 100 UNIT/ML FlexPen   Other Relevant Orders   Bayer DCA Hb A1c Waived (Completed)   Comprehensive metabolic panel   Lipid Panel w/o Chol/HDL Ratio   Insulin dependent type 2 diabetes mellitus (HCC)    Chronic, ongoing.  Urine ALB 30 October 2021.  A1c 9.3% today which is increased from previous 8.9%, pancreatitis presented with [place].  Had tolerated Ozempic well for years until recently.   - At this time will maintain off GLP1 or Mounjaro due to side effects.   - Continue Jardiance and Metformin.  Increase Levemir to 50 units.  Will start Novolog pre-meals as suspect main elevations after meals, educated her on this and not to take if does not eat  a meal -- discussed to monitor sugars closely. - Ordered DEXCOM, placed a sample on today and instructed her on use, helped her set-up -- would benefit from continuous monitoring due to age and insulin use. - Check blood sugar 4-5 times a day and document for provider.   - Eye and foot exam up to date. - Flu shot and Pneumonia vaccines up to date. Return in 4 weeks.       Relevant Medications   insulin detemir (LEVEMIR FLEXPEN) 100 UNIT/ML FlexPen   Type 2 diabetes mellitus with obesity ([facility]) - Primary    Chronic, ongoing.  Urine ALB 30 October 2021.  A1c 9.3% today which is increased from previous 8.9%, pancreatitis presented with [place].  Had tolerated Ozempic well for years until recently.   - At this time will maintain off GLP1 or Mounjaro due to side effects.   - Continue Jardiance and Metformin.  Increase Levemir to 50 units.  Will start Novolog pre-meals as suspect main elevations after meals, educated her on this and not to take if does not eat a meal -- discussed to monitor sugars closely. - Ordered DEXCOM, placed a sample on today and instructed her on use, helped her set-up -- would benefit from continuous monitoring due to age and insulin use. - Check blood sugar 4-5 times a day and document for provider.   - Eye and foot exam up to date. - Flu shot and Pneumonia vaccines up to date. Return in 4 weeks.      Relevant Medications   insulin detemir (LEVEMIR FLEXPEN) 100 UNIT/ML FlexPen   Other Relevant Orders   Bayer DCA Hb A1c Waived (Completed)     Musculoskeletal and Integument   Osteopenia of neck of right femur    Chronic.  Noted on Dexa in 2020.  Repeat Dexa 2025.  Continue daily Vit D and calcium. Check levels today.      Relevant Orders   VITAMIN D 25 Hydroxy (Vit-D Deficiency, Fractures)     Other   Dysuria    Acute for weeks with UA noting abnormals.  Will start Augmentin BID for 5 days.  Instructed her on this.  Sent Diflucan for precaution in case yeast with abx therapy.  Recommend increase fluid intake.  If ongoing symptoms alert provider.  Urine for culture and if need to change regimen will alert her.      Relevant Orders   Urinalysis, Routine w reflex microscopic (Completed)   Elevated LFTs    Recheck CMP today.      Relevant Orders   Comprehensive metabolic panel   Obesity    BMI 29.92.  Recommended  eating smaller high protein, low fat meals more frequently and exercising 30 mins a day 5 times a week with a goal of 10-15lb weight loss in the next 3 months. Patient voiced their understanding and motivation to adhere to these recommendations.       Relevant Medications   insulin detemir (LEVEMIR FLEXPEN) 100 UNIT/ML FlexPen   Vitamin D deficiency    Continue current supplement and plan for DEXA scan repeat in 2025 due to osteopenia..  Check Vit D today.      Relevant Orders   VITAMIN D 25 Hydroxy (Vit-D Deficiency, Fractures)   Other Visit Diagnoses     Pyuria       Urine for culture today.   Relevant Orders   Urine Culture        Follow up plan: Return in about 4  weeks (around 09/23/2022) for T2DM and HTN.

## 2022-08-27 LAB — LIPID PANEL W/O CHOL/HDL RATIO
Cholesterol, Total: 115 mg/dL (ref 100–199)
HDL: 47 mg/dL (ref >39)
LDL Chol Calc (NIH): 46 mg/dL (ref 0–99)
Triglycerides: 125 mg/dL (ref 0–149)
VLDL Cholesterol Cal: 22 mg/dL (ref 5–40)

## 2022-08-27 LAB — COMPREHENSIVE METABOLIC PANEL
ALT: 40 IU/L — ABNORMAL HIGH (ref 0–32)
AST: 28 IU/L (ref 0–40)
Albumin/Globulin Ratio: 2.1 (ref 1.2–2.2)
Albumin: 4.4 g/dL (ref 3.9–4.9)
Alkaline Phosphatase: 60 IU/L (ref 44–121)
BUN/Creatinine Ratio: 25 (ref 12–28)
BUN: 17 mg/dL (ref 8–27)
Bilirubin Total: 0.3 mg/dL (ref 0.0–1.2)
CO2: 25 mmol/L (ref 20–29)
Calcium: 9.2 mg/dL (ref 8.7–10.3)
Chloride: 97 mmol/L (ref 96–106)
Creatinine, Ser: 0.67 mg/dL (ref 0.57–1.00)
Globulin, Total: 2.1 g/dL (ref 1.5–4.5)
Glucose: 158 mg/dL — ABNORMAL HIGH (ref 70–99)
Potassium: 3.8 mmol/L (ref 3.5–5.2)
Sodium: 137 mmol/L (ref 134–144)
Total Protein: 6.5 g/dL (ref 6.0–8.5)
eGFR: 94 mL/min/{1.73_m2} (ref >59)

## 2022-08-27 LAB — LIPASE: Lipase: 33 U/L (ref 14–72)

## 2022-08-27 LAB — VITAMIN D 25 HYDROXY (VIT D DEFICIENCY, FRACTURES): Vit D, 25-Hydroxy: 38.2 ng/mL (ref 30.0–100.0)

## 2022-08-27 LAB — AMYLASE: Amylase: 65 U/L (ref 31–110)

## 2022-08-27 NOTE — Telephone Encounter (Signed)
Started a PA for the [employer]

## 2022-08-27 NOTE — Progress Notes (Signed)
Contacted via [place] evening Kieu, your labs have returned: - Kidney function, creatinine and eGFR, remains normal, and liver function, AST and ALT is improving with stopping Trulicity and Mounjaro. - Cholesterol levels are at goal, continue statin therapy. - Vitamin D normal range, continue supplement. - Pancreas labs now both returned to normal.  Any questions? Keep being amazing!!  Thank you for allowing me to participate in your care.  I appreciate you. Kindest regards, Minami Arriaga

## 2022-08-30 ENCOUNTER — Encounter: Payer: Self-pay | Admitting: Nurse Practitioner

## 2022-08-30 ENCOUNTER — Telehealth: Payer: Self-pay | Admitting: Nurse Practitioner

## 2022-08-30 ENCOUNTER — Telehealth: Payer: Self-pay

## 2022-08-30 LAB — URINE CULTURE

## 2022-08-30 NOTE — Progress Notes (Signed)
Contacted via MyChart   Good morning Charlene Juarez, the Augmentin you are taking will treat your current infection -- continue this until fully complete.  Any questions?

## 2022-08-30 NOTE — Telephone Encounter (Signed)
Copied from [place]. Topic: General - Other >> Aug 30, 2022  1:44 PM Oley Balm E wrote: Reason for CRM: Pt called reporting that she has yet to hear from her recent referral regarding her Dexcom. She is asking if anyone can message her via mychart with their contact information. Please advise

## 2022-08-30 NOTE — Telephone Encounter (Signed)
PA for [facility] has been denies

## 2022-09-02 NOTE — Progress Notes (Signed)
PA has been denied.  

## 2022-09-03 ENCOUNTER — Telehealth: Payer: Self-pay | Admitting: Nurse Practitioner

## 2022-09-03 NOTE — Telephone Encounter (Signed)
Copied from [place] [phone]. Topic: General - Inquiry >> Sep 02, 2022  4:18 PM [name] wrote: Reason for CRM: pt asked if she can stop by Monday to have Charlene Juarez help her put another G7 sensor on / please advise / pt is ok with a mychart message to advise

## 2022-09-03 NOTE — Telephone Encounter (Signed)
Pt scheduled for nurse visit on Monday @ 8 am

## 2022-09-03 NOTE — Telephone Encounter (Signed)
Would you like me to fit her in any slot or do you have a time in mind?

## 2022-09-06 ENCOUNTER — Ambulatory Visit: Payer: Medicare Other

## 2022-09-06 ENCOUNTER — Encounter: Payer: Self-pay | Admitting: Nurse Practitioner

## 2022-09-06 ENCOUNTER — Telehealth: Payer: Self-pay

## 2022-09-06 NOTE — Progress Notes (Signed)
Assisted patient with inserting new G7 sensor onto left arm. Patient tolerated well and had no questions.

## 2022-09-06 NOTE — Progress Notes (Signed)
  Chronic Care Management   Note  09/06/2022 Name: Charlene Juarez MRN: [id number] DOB: April 09, 1951  [name] is a 71 y.o. year old female who is a primary care patient of Cannady, Barbaraann Faster, NP. I reached out to [name] by phone today in response to a referral sent by Ms. [name] Walkins's PCP.  Ms. Caitlen Worth  agreedto scheduling an appointment with the CCM RN Case Manager   Follow up plan: Patient agreed to scheduled appointment with RN Case Manager on 09/24/2022(date/time).   Noreene Larsson, [place], [place] [address] Direct Dial: [phone] [email or web address]

## 2022-09-15 ENCOUNTER — Other Ambulatory Visit: Payer: Medicare Other | Admitting: Pharmacist

## 2022-09-16 ENCOUNTER — Encounter: Payer: Self-pay | Admitting: Nurse Practitioner

## 2022-09-16 NOTE — Telephone Encounter (Signed)
Pt scheduled  

## 2022-09-17 ENCOUNTER — Ambulatory Visit: Payer: Medicare Other

## 2022-09-17 NOTE — Progress Notes (Signed)
New Dexcom G7 sensor placed on patient's left arm. Patient tolerated well. Assisted patient with connecting new sensor to her phone. No questions or concerns from the patient at this time.

## 2022-09-20 NOTE — Patient Instructions (Signed)

## 2022-09-21 ENCOUNTER — Other Ambulatory Visit: Payer: Self-pay

## 2022-09-21 ENCOUNTER — Encounter: Payer: Self-pay | Admitting: Nurse Practitioner

## 2022-09-23 ENCOUNTER — Other Ambulatory Visit: Payer: Self-pay | Admitting: Nurse Practitioner

## 2022-09-23 ENCOUNTER — Encounter: Payer: Self-pay | Admitting: Nurse Practitioner

## 2022-09-23 ENCOUNTER — Ambulatory Visit (INDEPENDENT_AMBULATORY_CARE_PROVIDER_SITE_OTHER): Payer: Medicare Other | Admitting: Nurse Practitioner

## 2022-09-23 VITALS — BP 133/76 | HR 65 | Temp 97.6°F | Ht 62.01 in | Wt 169.4 lb

## 2022-09-23 DIAGNOSIS — E119 Type 2 diabetes mellitus without complications: Secondary | ICD-10-CM | POA: Diagnosis not present

## 2022-09-23 DIAGNOSIS — E1159 Type 2 diabetes mellitus with other circulatory complications: Secondary | ICD-10-CM | POA: Diagnosis not present

## 2022-09-23 DIAGNOSIS — Z794 Long term (current) use of insulin: Secondary | ICD-10-CM

## 2022-09-23 DIAGNOSIS — I152 Hypertension secondary to endocrine disorders: Secondary | ICD-10-CM

## 2022-09-23 MED ORDER — DEXCOM G7 RECEIVER DEVI: 12 refills | Status: AC

## 2022-09-23 MED ORDER — DEXCOM G6 TRANSMITTER MISC: 5 refills | Status: DC

## 2022-09-23 MED ORDER — DEXCOM G7 SENSOR MISC: 12 refills | Status: DC

## 2022-09-23 NOTE — Telephone Encounter (Signed)
Requested medication (s) are due for refill today: alternative requested  Requested medication (s) are on the active medication list: yes  Last refill:  09/23/21  Future visit scheduled: yes  Notes to clinic:   Alternative Requested:SEND RX FOR THE TRANSMITTER FIRST [place], SO PLEASE SEND RX.      Requested Prescriptions  Pending Prescriptions Disp Refills   Continuous Blood Gluc Transmit (DEXCOM G6 TRANSMITTER) MISC [Pharmacy Med Name: DEXCOM G6 [place]  0     Endocrinology: Diabetes - Testing Supplies Passed - 09/23/2022  1:26 PM      Passed - Valid encounter within last 12 months    Recent Outpatient Visits           Today Insulin dependent type 2 diabetes mellitus ([place])   [place], Jolene T, NP   4 weeks ago Type 2 diabetes mellitus with obesity ([place])   [place], Jolene T, NP   1 month ago Type 2 diabetes mellitus with obesity ([place])   [place], Jolene T, NP   3 months ago Type 2 diabetes mellitus with obesity ([place])   [place] Cannady, Jolene T, NP   4 months ago Drug-induced acute pancreatitis without infection or necrosis   [place], Barbaraann Faster, NP       Future Appointments             In 2 months Cannady, Barbaraann Faster, NP [employer]

## 2022-09-23 NOTE — Assessment & Plan Note (Signed)
Chronic, stable.  BP at goal in office and at home.  Continue Telmisartan for kidney and remain off HCTZ as could diminish effects of insulin == may need to consider Amlodipine if BP trends up in future.  LABS: up to date.  Recommend she continue to check BP at home on occasion and document for visits. Focus on DASH diet.  Return in 2 months.

## 2022-09-23 NOTE — Progress Notes (Signed)
BP 133/76   Pulse 65   Temp 97.6 F (36.4 C) (Oral)   Ht 5' 2.01" (1.575 m)   Wt 169 lb 6.4 oz (76.8 kg)   LMP 07/13/2000 (Approximate)   SpO2 96%   BMI 30.98 kg/m    Subjective:    Patient ID: [name], female    DOB: [birth year], 72 y.o.   MRN: [id number]  HPI: Danitra Payano is a 72 y.o. female  Chief Complaint  Patient presents with   Hypertension   Diabetes   DIABETES At last visit we stopped her HCTZ due to diminishing affect possible on her insulin.  Continues Telmisartan daily.  Her A1c was 9.3% in December and she was started on Novolog pre meal.  Continues Levemir 50 units and Jardiance + Metformin. Can not take GLP1 due to pancreatitis presenting with these in past -- tried [place].  We placed a Dexcom -- getting samples.  Currently insurance not covering Dexcom or Freestyle.  Per her 14 day reading -- A1c current 6.8% and in range 79% of the time -- <1% low.   Hypoglycemic episodes: as above Polydipsia/polyuria: no Visual disturbance: no Chest pain: no Paresthesias: no Glucose Monitoring: yes  Accucheck frequency:  as above  Fasting glucose:  Post prandial:  Evening:  Before meals: Taking Insulin?: yes  Long acting insulin: 30 units  Short acting insulin: 8 units Blood Pressure Monitoring: not checking Retinal Examination: Up to Date Foot Exam: Up to Date Diabetic Education: Completed Pneumovax: Up to Date Influenza: Up to Date Aspirin: no   Relevant past medical, surgical, family and social history reviewed and updated as indicated. Interim medical history since our last visit reviewed. Allergies and medications reviewed and updated.  Review of Systems  Constitutional:  Negative for activity change, appetite change, diaphoresis, fatigue and fever.  Respiratory:  Negative for cough, chest tightness, shortness of breath and wheezing.   Cardiovascular:  Negative for chest pain, palpitations and leg swelling.   Endocrine: Negative for polydipsia, polyphagia and polyuria.  Neurological: Negative.   Psychiatric/Behavioral: Negative.      Per HPI unless specifically indicated above     Objective:    BP 133/76   Pulse 65   Temp 97.6 F (36.4 C) (Oral)   Ht 5' 2.01" (1.575 m)   Wt 169 lb 6.4 oz (76.8 kg)   LMP 07/13/2000 (Approximate)   SpO2 96%   BMI 30.98 kg/m   Wt Readings from Last 3 Encounters:  09/23/22 169 lb 6.4 oz (76.8 kg)  08/26/22 163 lb 9.6 oz (74.2 kg)  07/29/22 164 lb 11.2 oz (74.7 kg)    Physical Exam Vitals and nursing note reviewed.  Constitutional:      General: She is awake. She is not in acute distress.    Appearance: She is well-developed and well-groomed. She is obese. She is not ill-appearing or toxic-appearing.  HENT:     Head: Normocephalic.  Eyes:     General: Lids are normal. No scleral icterus.    Pupils: Pupils are equal, round, and reactive to light.  Neck:     Vascular: No carotid bruit.  Cardiovascular:     Rate and Rhythm: Normal rate and regular rhythm.     Pulses: Normal pulses.     Heart sounds: Normal heart sounds. No murmur heard.    No gallop.  Pulmonary:     Effort: Pulmonary effort is normal. No accessory muscle usage or respiratory distress.  Breath sounds: Normal breath sounds.  Abdominal:     General: Bowel sounds are normal. There is no distension.     Palpations: Abdomen is soft.     Tenderness: There is no abdominal tenderness.  Musculoskeletal:     Cervical back: Full passive range of motion without pain.     Right lower leg: No edema.     Left lower leg: No edema.  Skin:    General: Skin is warm and dry.  Neurological:     Mental Status: She is alert.     Deep Tendon Reflexes: Reflexes are normal and symmetric.     Reflex Scores:      Bicep reflexes are 2+ on the right side and 2+ on the left side.      Brachioradialis reflexes are 2+ on the right side and 2+ on the left side. Psychiatric:        Attention and  Perception: Attention normal.        Mood and Affect: Mood normal.        Speech: Speech normal.        Behavior: Behavior is cooperative.        Thought Content: Thought content normal.     Results for orders placed or performed in visit on 08/26/22  Microscopic Examination   Urine  Result Value Ref Range   WBC, UA 11-30 (A) 0 - 5 /hpf   RBC, Urine 0-2 0 - 2 /hpf   Epithelial Cells (non renal) 0-10 0 - 10 /hpf   Bacteria, UA Many (A) None seen/Few  Urine Culture   Specimen: Urine   UR  Result Value Ref Range   Urine Culture, Routine Final report (A)    Organism ID, Bacteria Comment (A)    Antimicrobial Susceptibility Comment   Bayer DCA Hb A1c Waived  Result Value Ref Range   HB A1C (BAYER DCA - WAIVED) 9.3 (H) 4.8 - 5.6 %  Comprehensive metabolic panel  Result Value Ref Range   Glucose 158 (H) 70 - 99 mg/dL   BUN 17 8 - 27 mg/dL   Creatinine, Ser 0.67 0.57 - 1.00 mg/dL   eGFR 94 >59 mL/min/1.73   BUN/Creatinine Ratio 25 12 - 28   Sodium 137 134 - 144 mmol/L   Potassium 3.8 3.5 - 5.2 mmol/L   Chloride 97 96 - 106 mmol/L   CO2 25 20 - 29 mmol/L   Calcium 9.2 8.7 - 10.3 mg/dL   Total Protein 6.5 6.0 - 8.5 g/dL   Albumin 4.4 3.9 - 4.9 g/dL   Globulin, Total 2.1 1.5 - 4.5 g/dL   Albumin/Globulin Ratio 2.1 1.2 - 2.2   Bilirubin Total 0.3 0.0 - 1.2 mg/dL   Alkaline Phosphatase 60 44 - 121 IU/L   AST 28 0 - 40 IU/L   ALT 40 (H) 0 - 32 IU/L  Lipid Panel w/o Chol/HDL Ratio  Result Value Ref Range   Cholesterol, Total 115 100 - 199 mg/dL   Triglycerides 125 0 - 149 mg/dL   HDL 47 >39 mg/dL   VLDL Cholesterol Cal 22 5 - 40 mg/dL   LDL Chol Calc (NIH) 46 0 - 99 mg/dL  VITAMIN D 25 Hydroxy (Vit-D Deficiency, Fractures)  Result Value Ref Range   Vit D, 25-Hydroxy 38.2 30.0 - 100.0 ng/mL  Amylase  Result Value Ref Range   Amylase 65 31 - 110 U/L  Lipase  Result Value Ref Range   Lipase 33 14 - 72 U/L  Urinalysis, Routine w reflex microscopic  Result Value Ref Range    Specific Gravity, UA 1.015 1.005 - 1.030   pH, UA 5.0 5.0 - 7.5   Color, UA Yellow Yellow   Appearance Ur Turbid (A) Clear   Leukocytes,UA 1+ (A) Negative   Protein,UA Negative Negative/Trace   Glucose, UA 3+ (A) Negative   Ketones, UA Negative Negative   RBC, UA Trace (A) Negative   Bilirubin, UA Negative Negative   Urobilinogen, Ur 0.2 0.2 - 1.0 mg/dL   Nitrite, UA Positive (A) Negative   Microscopic Examination See below:       Assessment & Plan:   Problem List Items Addressed This Visit       Cardiovascular and Mediastinum   Hypertension associated with diabetes ([place]) - Primary    Chronic, stable.  BP at goal in office and at home.  Continue Telmisartan for kidney and remain off HCTZ as could diminish effects of insulin == may need to consider Amlodipine if BP trends up in future.  LABS: up to date.  Recommend she continue to check BP at home on occasion and document for visits. Focus on DASH diet.  Return in 2 months.        Endocrine   Insulin dependent type 2 diabetes mellitus (HCC)    Chronic, ongoing.  Urine ALB 30 October 2021.  A1c 9.3% December 2023 which is increased from previous 8.9%, pancreatitis presented with [place].  Had tolerated Ozempic well for years.  Started Novolog last visit and she is using Dexcom samples with her BS noted to be trending down. - At this time will maintain off GLP1 or Mounjaro due to side effects.   - Continue Jardiance and Metformin. Continue Levemir 30 units.  Continue Novolog pre-meals 8 units as suspect main elevations after meals, educated her on this and not to take if does not eat a meal -- discussed to monitor sugars closely. - Ordered DEXCOM again and will continue samples for now -- will appeal if Medicare denies as patient is over 65 and now on insulin QID. - Check blood sugar 4-5 times a day and document for provider.   - Eye and foot exam up to date. - Flu shot and Pneumonia vaccines up to date. Return in 2  months.        Follow up plan: Return in about 2 months (around 11/22/2022) for T2DM, HTN/HLD, OSA, OSTEOPENIA.

## 2022-09-23 NOTE — Assessment & Plan Note (Signed)
Chronic, ongoing.  Urine ALB 30 October 2021.  A1c 9.3% December 2023 which is increased from previous 8.9%, pancreatitis presented with [place].  Had tolerated Ozempic well for years.  Started Novolog last visit and she is using Dexcom samples with her BS noted to be trending down. - At this time will maintain off GLP1 or Mounjaro due to side effects.   - Continue Jardiance and Metformin. Continue Levemir 30 units.  Continue Novolog pre-meals 8 units as suspect main elevations after meals, educated her on this and not to take if does not eat a meal -- discussed to monitor sugars closely. - Ordered DEXCOM again and will continue samples for now -- will appeal if Medicare denies as patient is over 65 and now on insulin QID. - Check blood sugar 4-5 times a day and document for provider.   - Eye and foot exam up to date. - Flu shot and Pneumonia vaccines up to date. Return in 2 months.

## 2022-09-24 ENCOUNTER — Ambulatory Visit (INDEPENDENT_AMBULATORY_CARE_PROVIDER_SITE_OTHER): Payer: Medicare Other

## 2022-09-24 ENCOUNTER — Telehealth: Payer: Self-pay | Admitting: Nurse Practitioner

## 2022-09-24 ENCOUNTER — Telehealth: Payer: Medicare Other

## 2022-09-24 DIAGNOSIS — I152 Hypertension secondary to endocrine disorders: Secondary | ICD-10-CM

## 2022-09-24 DIAGNOSIS — E119 Type 2 diabetes mellitus without complications: Secondary | ICD-10-CM

## 2022-09-24 DIAGNOSIS — E1169 Type 2 diabetes mellitus with other specified complication: Secondary | ICD-10-CM

## 2022-09-24 NOTE — Telephone Encounter (Signed)
Copied from [place] [id number]. Topic: General - Other >> Sep 24, 2022 11:11 AM Cyndi Bender wrote: Reason for CRM: Pt reports that her pharmacy informed her that they need a Rx for the [facility] reader

## 2022-09-24 NOTE — Chronic Care Management (AMB) (Signed)
Chronic Care Management   CCM RN Visit Note  09/24/2022 Name: Charlene Juarez MRN: [id number] DOB: 1951/04/28  Subjective: Charlene Juarez is a 72 y.o. year old female who is a primary care patient of Cannady, Barbaraann Faster, NP. The patient was referred to the Chronic Care Management team for assistance with care management needs subsequent to provider initiation of CCM services and plan of care.    Today's Visit:  Engaged with patient by telephone for initial visit.     SDOH Interventions Today    Flowsheet Row Most Recent Value  SDOH Interventions   Food Insecurity Interventions Intervention Not Indicated  Housing Interventions Intervention Not Indicated  Transportation Interventions Intervention Not Indicated  Utilities Interventions Intervention Not Indicated  Alcohol Usage Interventions Intervention Not Indicated (Score <7)  Financial Strain Interventions Intervention Not Indicated  Physical Activity Interventions Intervention Not Indicated  Stress Interventions Intervention Not Indicated  Social Connections Interventions Intervention Not Indicated         Goals Addressed             This Visit's Progress    CCM Expected Outcome:  Monitor, Self-Manage and Reduce Symptoms of Diabetes       Current Barriers:  Knowledge Deficits related to diabetes management education and benefits of controlled DM  Care Coordination needs related to denial of Dexcom and reordering of Dexcom meter, may need pharm D support in the future in a patient with DM  Chronic Disease Management support and education needs related to effective management of DM Lab Results  Component Value Date   HGBA1C 9.3 (H) 08/26/2022    Planned Interventions: Provided education to patient about basic DM disease process; Reviewed medications with patient and discussed importance of medication adherence. The patient did well on Ozempic but cannot tolerate it now, causes GI upset. She states she  wishes she could take it because it managed her DM so much better. The patient is compliant with medications. Sometimes forgets to take meal coverage. New order placed by the pcp for the Dexcom. The patient is hopeful it will be approved this time. Education and support given;        Reviewed prescribed diet with patient heart healthy/ADA diet. Discussed making sure she eats a light snack at bedtime to help with regulation of sugars during sleep times; Counseled on importance of regular laboratory monitoring as prescribed;        Discussed plans with patient for ongoing care management follow up and provided patient with direct contact information for care management team;      Provided patient with written educational materials related to hypo and hyperglycemia and importance of correct treatment. With the Dexcom meter she has had some lows in the 60's and highest she has seen is >300 after eating some pecan pie. The patient knows how to effectively manage highs and lows;       Reviewed scheduled/upcoming provider appointments including: 11-24-2022, saw pcp yesterday;         Advised patient, providing education and rationale, to check cbg when you have symptoms of low or high blood sugar and has a continuous glucose reader at this time  and record. Currently has a continuous glucose reader. The patient states she hopes the insurance will approve for her to keep getting the Dexcom sensors. She will run out after this particular one she has on. Education and support given.        call provider for findings outside established parameters;  Review of patient status, including review of consultants reports, relevant laboratory and other test results, and medications completed;       Advised patient to discuss changes in DM health and well being with provider;      Screening for signs and symptoms of depression related to chronic disease state;        Assessed social determinant of health barriers;    The  patient is having some tinnitus in her ears that she has noticed more recently. She has a sound machine and that helps a lot most of the time but not all of the time. Will send information to the patient about the Epley maneuver to see if this possibly may help with her tinnitus she is experiencing. She states this has been going on for a while. Denies any safety concerns      Symptom Management: Take medications as prescribed   Attend all scheduled provider appointments Call provider office for new concerns or questions  call the Suicide and Crisis Lifeline: 988 call the [place] National Suicide Prevention Lifeline: 734-217-0997 or TTY: 623-830-6721 TTY 727-673-7617) to talk to a trained counselor call 1-800-273-TALK (toll free, 24 hour hotline) if experiencing a Mental Health or [place]  keep appointment with eye doctor check feet daily for cuts, sores or redness trim toenails straight across manage portion size wash and dry feet carefully every day wear comfortable, cotton socks wear comfortable, well-fitting shoes  Follow Up Plan: Telephone follow up appointment with care management team member scheduled for: 11-12-2022 at 1030 am       CCM Expected Outcome:  Monitor, Self-Manage and Reduce Symptoms of: HLD       Current Barriers:  Chronic Disease Management support and education needs related to for effective management of HLD  Planned Interventions: Provider established cholesterol goals reviewed; Counseled on importance of regular laboratory monitoring as prescribed; Provided HLD educational materials; Reviewed role and benefits of statin for ASCVD risk reduction; Discussed strategies to manage statin-induced myalgias; Reviewed importance of limiting foods high in cholesterol; Reviewed exercise goals and target of 150 minutes per week; Screening for signs and symptoms of depression related to chronic disease state;  Assessed social determinant of health  barriers;   Symptom Management: Take medications as prescribed   Attend all scheduled provider appointments Call provider office for new concerns or questions  call the Suicide and Crisis Lifeline: 988 call the [place] National Suicide Prevention Lifeline: 236 437 4349 or TTY: 4166231123 TTY (743)662-6562) to talk to a trained counselor call 1-800-273-TALK (toll free, 24 hour hotline) if experiencing a Mental Health or [place]  - call for medicine refill 2 or 3 days before it runs out - take all medications exactly as prescribed - call doctor with any symptoms you believe are related to your medicine - call doctor when you experience any new symptoms - go to all doctor appointments as scheduled - adhere to prescribed diet: heart healthy/ADA diet  - develop an exercise routine  Follow Up Plan: Telephone follow up appointment with care management team member scheduled for: 11-12-2022 at 105 am       CCM Expected Outcome:  Monitor, Self-Manage, and Reduce Symptoms of Hypertension       Current Barriers:  Knowledge Deficits related to the benefits for taking blood pressures on a regular basis and recording Chronic Disease Management support and education needs related to effective management of HTN BP Readings from Last 3 Encounters:  09/23/22 133/76  08/26/22 133/71  07/29/22 133/65     Planned Interventions: Evaluation of current treatment plan related to hypertension self management and patient's adherence to plan as established by provider;   Provided education to patient re: stroke prevention, s/s of heart attack and stroke; Reviewed prescribed diet heart healthy/ADA diet  Reviewed medications with patient and discussed importance of compliance;  Counseled on the importance of exercise goals with target of 150 minutes per week Discussed plans with patient for ongoing care management follow up and provided patient with direct contact information for care  management team; Advised patient, providing education and rationale, to monitor blood pressure daily and record, calling PCP for findings outside established parameters. The patient normally does not take blood pressures at home. Has a cuff. Education and support given on the benefits of checking blood pressures on a regular basis;  Reviewed scheduled/upcoming provider appointments including: 11-24-2022 at 0900 am with the pcp Advised patient to discuss changes in her blood pressures and heart health with provider; Provided education on prescribed diet heart healthy/ADA diet ;  Discussed complications of poorly controlled blood pressure such as heart disease, stroke, circulatory complications, vision complications, kidney impairment, sexual dysfunction;  Screening for signs and symptoms of depression related to chronic disease state;  Assessed social determinant of health barriers;   Symptom Management: Take medications as prescribed   Attend all scheduled provider appointments Call provider office for new concerns or questions  call the Suicide and Crisis Lifeline: 988 call the [place] National Suicide Prevention Lifeline: (732) 740-6403 or TTY: (601) 381-1945 TTY 219 104 5961) to talk to a trained counselor call 1-800-273-TALK (toll free, 24 hour hotline) if experiencing a Mental Health or [place]  check blood pressure weekly learn about high blood pressure call doctor for signs and symptoms of high blood pressure develop an action plan for high blood pressure keep all doctor appointments take medications for blood pressure exactly as prescribed begin an exercise program report new symptoms to your doctor  Follow Up Plan: Telephone follow up appointment with care management team member scheduled for: 11-12-2022 at 1030 am          Plan:Telephone follow up appointment with care management team member scheduled for:  11-12-2022 at 69 am  Noreene Larsson RN, MSN, CCM RN Care  Manager  Chronic Care Management Direct Number: [phone]

## 2022-09-24 NOTE — Plan of Care (Signed)
Chronic Care Management Provider Comprehensive Care Plan    09/24/2022 Name: Charlene Juarez MRN: [id number] DOB: September 23, 1950  Referral to Chronic Care Management (CCM) services was placed by Provider:  Marnee Guarneri on Date: 08-27-2022.  Chronic Condition 1: DM Provider Assessment and Plan   Chronic, ongoing.  Urine ALB 30 October 2021.  A1c 9.3% today which is increased from previous 8.9%, pancreatitis presented with [place].  Had tolerated Ozempic well for years until recently.   - At this time will maintain off GLP1 or Mounjaro due to side effects.   - Continue Jardiance and Metformin.  Increase Levemir to 50 units.  Will start Novolog pre-meals as suspect main elevations after meals, educated her on this and not to take if does not eat a meal -- discussed to monitor sugars closely. - Ordered DEXCOM, placed a sample on today and instructed her on use, helped her set-up -- would benefit from continuous monitoring due to age and insulin use. - Check blood sugar 4-5 times a day and document for provider.   - Eye and foot exam up to date. - Flu shot and Pneumonia vaccines up to date. Return in 4 weeks.         Relevant Medications    insulin detemir (LEVEMIR FLEXPEN) 100 UNIT/ML FlexPen    Other Relevant Orders    Bayer DCA Hb A1c Waived (Completed)     Expected Outcome/Goals Addressed This Visit (Provider CCM goals/Provider Assessment and plan   CCM (Diabetes)  EXPECTED OUTCOME:  MONITOR,SELF- MANAGE AND REDUCE SYMPTOMS OF Diabetes   Symptom Management Condition 1: Take all medications as prescribed Attend all scheduled provider appointments Call provider office for new concerns or questions  call the Suicide and Crisis Lifeline: 988 call the [place] National Suicide Prevention Lifeline: 480 021 0269 or TTY: (714) 706-9969 [place] 802-164-4645) to talk to a trained counselor call 1-800-273-TALK (toll free, 24 hour hotline) if experiencing a Mental Health or  [place]  schedule appointment with eye doctor check feet daily for cuts, sores or redness trim toenails straight across manage portion size wash and dry feet carefully every day wear comfortable, cotton socks wear comfortable, well-fitting shoes  Chronic Condition 2: HTN Provider Assessment and Plan Chronic, stable.  BP at goal in office and at home.  Continue Telmisartan for kidney and remain off HCTZ as could diminish effects of insulin == may need to consider Amlodipine if BP trends up in future.  LABS: up to date.  Recommend she continue to check BP at home on occasion and document for visits. Focus on DASH diet.  Return in 2 months.     Expected Outcome/Goals Addressed This Visit (Provider CCM goals/Provider Assessment and plan   CCM (HYPERTENSION)  EXPECTED OUTCOME:  MONITOR,SELF- MANAGE AND REDUCE SYMPTOMS OF HYPERTENSION   Symptom Management Condition 2: Take all medications as prescribed Attend all scheduled provider appointments Call provider office for new concerns or questions  call the Suicide and Crisis Lifeline: 988 call the [place] National Suicide Prevention Lifeline: 479-800-3023 or TTY: 929-150-6383 TTY [phone]) to talk to a trained counselor call 1-800-273-TALK (toll free, 24 hour hotline) if experiencing a Mental Health or [place]  check blood pressure weekly learn about high blood pressure take blood pressure log to all doctor appointments call doctor for signs and symptoms of high blood pressure keep all doctor appointments take medications for blood pressure exactly as prescribed begin an exercise program report new symptoms to your doctor  Chronic Condition  3: HLD Provider Assessment and Plan  Chronic, ongoing.  Will continue Rosuvastatin daily and adjust dose as needed.  Lipid panel today.           Relevant Medications    insulin detemir (LEVEMIR FLEXPEN) 100 UNIT/ML FlexPen    Other Relevant Orders    Bayer  DCA Hb A1c Waived (Completed)    Comprehensive metabolic panel    Lipid Panel w/o Chol/HDL Ratio     Expected Outcome/Goals Addressed This Visit (Provider CCM goals/Provider Assessment and plan   CCM (HLD)  EXPECTED OUTCOME:  MONITOR,SELF- MANAGE AND REDUCE SYMPTOMS OF HLD   Symptom Management Condition 3: Take all medications as prescribed Attend all scheduled provider appointments Call provider office for new concerns or questions  call the Suicide and Crisis Lifeline: 988 call the [place] National Suicide Prevention Lifeline: 360-758-1876 or TTY: 502-249-8881 TTY 5406013274) to talk to a trained counselor call 1-800-273-TALK (toll free, 24 hour hotline) if experiencing a Mental Health or [place]  call for medicine refill 2 or 3 days before it runs out take all medications exactly as prescribed call doctor with any symptoms you believe are related to your medicine call doctor when you experience any new symptoms go to all doctor appointments as scheduled adhere to prescribed diet: heart healthy/ADA diet develop an exercise routine  Problem List Patient Active Problem List   Diagnosis Date Noted   Insulin dependent type 2 diabetes mellitus ([place]) 08/26/2022   Dysuria 08/26/2022   OSA (obstructive sleep apnea) 08/26/2022   Acute pancreatitis 05/23/2022   Elevated LFTs 05/23/2022   Fatty liver 05/23/2022   Osteopenia of neck of right femur 10/31/2019   B12 deficiency 01/18/2019   Vitamin D deficiency 10/19/2018   Asthma, intermittent 06/11/2015   Type 2 diabetes mellitus with obesity ([place]) 04/08/2015   Hypertension associated with diabetes ([place]) 04/08/2015   Hyperlipidemia associated with type 2 diabetes mellitus ([place]) 04/08/2015   GERD (gastroesophageal reflux disease) 04/08/2015   Obesity 04/08/2015   Allergic rhinitis 04/08/2015   Rosacea 04/08/2015    Medication Management  Current Outpatient Medications:    albuterol (VENTOLIN HFA) 108 (90 Base)  MCG/ACT inhaler, TAKE 2 PUFFS BY MOUTH EVERY 6 HOURS AS NEEDED FOR WHEEZE OR SHORTNESS OF BREATH (Patient taking differently: Inhale 2 puffs into the lungs every 6 (six) hours as needed for wheezing or shortness of breath. TAKE 2 PUFFS BY MOUTH EVERY 6 HOURS AS NEEDED FOR WHEEZE OR SHORTNESS OF BREATH), Disp: 18 each, Rfl: 2   Ascorbic Acid (VITAMIN C) 1000 MG tablet, Take 1,000 mg by mouth daily., Disp: , Rfl:    Cholecalciferol (VITAMIN D3) 1000 units CAPS, Take 1,000 Units by mouth at bedtime., Disp: , Rfl:    Continuous Blood Gluc Receiver ([place]) [place], For continuous blood sugar monitoring as injecting insulin 4 times a day and over age 79.  Dx E11.9, Z79.4, Disp: 1 each, Rfl: 12   Continuous Blood Gluc Transmit (DEXCOM G6 TRANSMITTER) MISC, For continuous blood sugar monitoring as injecting insulin 4 times a day and over age 63.  Dx E11.9, Z79.4, Disp: 1 each, Rfl: 5   gabapentin (NEURONTIN) 100 MG capsule, Take 1 capsule (100 mg total) by mouth 3 (three) times daily. (Patient taking differently: Take 100 mg by mouth at bedtime.), Disp: 90 capsule, Rfl: 12   Ginkgo Biloba 40 MG TABS, Take 1 tablet by mouth daily., Disp: , Rfl:    glucose blood (CONTOUR TEST) test strip, USE TO TEST  BLOOD SUGAR ONCE A DAY == DX E11.69, Disp: 100 strip, Rfl: 4   insulin aspart (NOVOLOG FLEXPEN) 100 UNIT/ML FlexPen, Inject 5 Units into the skin 3 (three) times daily with meals., Disp: 15 mL, Rfl: 4   insulin detemir (LEVEMIR FLEXPEN) 100 UNIT/ML FlexPen, Inject 50 Units into the skin at bedtime., Disp: 15 mL, Rfl: 11   Insulin Pen Needle 31G X 8 MM MISC, Use one needle daily to inject insulin., Disp: 100 each, Rfl: 4   JARDIANCE 25 MG TABS tablet, TAKE 1 TABLET (25 MG TOTAL) BY MOUTH DAILY., Disp: 90 tablet, Rfl: 0   Magnesium 500 MG TABS, Take 500 mg by mouth at bedtime., Disp: , Rfl:    metFORMIN (GLUCOPHAGE) 1000 MG tablet, TAKE 1 TABLET (1,000 MG TOTAL) BY MOUTH 2 (TWO) TIMES DAILY WITH A MEAL.,  Disp: 180 tablet, Rfl: 1   Omega-3 Fatty Acids (FISH OIL) 1200 MG CAPS, Take 1,200 mg by mouth daily., Disp: , Rfl:    pantoprazole (PROTONIX) 20 MG tablet, TAKE 1 TABLET BY MOUTH TWICE A DAY, Disp: 180 tablet, Rfl: 1   polyethylene glycol (MIRALAX / GLYCOLAX) 17 g packet, Take 17 g by mouth daily as needed for moderate constipation., Disp: 14 each, Rfl: 0   rosuvastatin (CRESTOR) 20 MG tablet, TAKE 1 TABLET BY MOUTH EVERY DAY, Disp: 90 tablet, Rfl: 3   SYMBICORT 160-4.5 MCG/ACT inhaler, TAKE 2 PUFFS BY MOUTH TWICE A DAY (Patient taking differently: Inhale 2 puffs into the lungs 2 (two) times daily as needed (shortness of breath).), Disp: 30.6 each, Rfl: 3   telmisartan (MICARDIS) 80 MG tablet, TAKE 1 TABLET BY MOUTH EVERY DAY, Disp: 90 tablet, Rfl: 1  Cognitive Assessment Identity Confirmed: : Name; DOB Cognitive Status: Normal   Functional Assessment Hearing Difficulty or Deaf: yes Hearing Management: has tinnitis, 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- uses sound machine Wear Glasses or Blind: yes Vision Management: wears glasses, gets regular eye exams Concentrating, Remembering or Making Decisions Difficulty (CP): no Difficulty Communicating: no Difficulty Eating/Swallowing: no Walking or Climbing Stairs Difficulty: no Dressing/Bathing Difficulty: no Doing Errands Independently Difficulty (such as shopping) (CP): no Change in Functional Status Since Onset of Current Illness/Injury: no   Caregiver Assessment  Primary Source of Support/Comfort: spouse Name of Support/Comfort Primary Source: Marcello Moores- husband People in Home: spouse Name(s) of People in Home: Marcello Moores- husband Family Caregiver if Needed: spouse Family Caregiver Names: Marcello Moores- husband Primary Roles/Responsibilities: retired   Planned Interventions  Provided education to patient about basic DM disease process; Reviewed medications with patient and discussed importance of medication adherence. The patient did well on Ozempic  but cannot tolerate it now, causes GI upset. She states she wishes she could take it because it managed her DM so much better. The patient is compliant with medications. Sometimes forgets to take meal coverage. New order placed by the pcp for the Dexcom. The patient is hopeful it will be approved this time. Education and support given;        Reviewed prescribed diet with patient heart healthy/ADA diet. Discussed making sure she eats a light snack at bedtime to help with regulation of sugars during sleep times; Counseled on importance of regular laboratory monitoring as prescribed;        Discussed plans with patient for ongoing care management follow up and provided patient with direct contact information for care management team;      Provided patient with written educational materials related to hypo and hyperglycemia and importance of correct treatment. With  the Dexcom meter she has had some lows in the 60's and highest she has seen is >300 after eating some pecan pie. The patient knows how to effectively manage highs and lows;       Reviewed scheduled/upcoming provider appointments including: 11-24-2022, saw pcp yesterday;         Advised patient, providing education and rationale, to check cbg when you have symptoms of low or high blood sugar and has a continuous glucose reader at this time  and record. Currently has a continuous glucose reader. The patient states she hopes the insurance will approve for her to keep getting the Dexcom sensors. She will run out after this particular one she has on. Education and support given.        call provider for findings outside established parameters;       Review of patient status, including review of consultants reports, relevant laboratory and other test results, and medications completed;       Advised patient to discuss changes in DM health and well being with provider;      Screening for signs and symptoms of depression related to chronic disease state;         Assessed social determinant of health barriers;    The patient is having some tinnitus in her ears that she has noticed more recently. She has a sound machine and that helps a lot most of the time but not all of the time. Will send information to the patient about the Epley maneuver to see if this possibly may help with her tinnitus she is experiencing. She states this has been going on for a while. Denies any safety concerns     Provider established cholesterol goals reviewed; Counseled on importance of regular laboratory monitoring as prescribed; Provided HLD educational materials; Reviewed role and benefits of statin for ASCVD risk reduction; Discussed strategies to manage statin-induced myalgias; Reviewed importance of limiting foods high in cholesterol; Reviewed exercise goals and target of 150 minutes per week; Screening for signs and symptoms of depression related to chronic disease state;  Assessed social determinant of health barriers;  Evaluation of current treatment plan related to hypertension self management and patient's adherence to plan as established by provider;   Provided education to patient re: stroke prevention, s/s of heart attack and stroke; Reviewed prescribed diet heart healthy/ADA diet  Reviewed medications with patient and discussed importance of compliance;  Counseled on the importance of exercise goals with target of 150 minutes per week Discussed plans with patient for ongoing care management follow up and provided patient with direct contact information for care management team; Advised patient, providing education and rationale, to monitor blood pressure daily and record, calling PCP for findings outside established parameters. The patient normally does not take blood pressures at home. Has a cuff. Education and support given on the benefits of checking blood pressures on a regular basis;  Reviewed scheduled/upcoming provider appointments including: 11-24-2022 at  0900 am with the pcp Advised patient to discuss changes in her blood pressures and heart health with provider; Provided education on prescribed diet heart healthy/ADA diet ;  Discussed complications of poorly controlled blood pressure such as heart disease, stroke, circulatory complications, vision complications, kidney impairment, sexual dysfunction;  Screening for signs and symptoms of depression related to chronic disease state;  Assessed social determinant of health barriers;     Interaction and coordination with outside resources, practitioners, and providers See CCM Referral  Care Plan: Available in MyChart

## 2022-09-24 NOTE — Patient Instructions (Addendum)
Please call the care guide team at [phone] if you need to cancel or reschedule your appointment.   If you are experiencing a Mental Health or [place] or need someone to talk to, please call the Suicide and Crisis Lifeline: 988 call the [place] National Suicide Prevention Lifeline: 8134196985 or TTY: 519-305-0218 TTY [phone]) to talk to a trained counselor call 1-800-273-TALK (toll free, 24 hour hotline)   Following is a copy of the CCM Program Consent:  CCM service includes personalized support from designated clinical staff supervised by the physician, including individualized plan of care and coordination with other care providers 24/7 contact phone numbers for assistance for urgent and routine care needs. Service will only be billed when office clinical staff spend 20 minutes or more in a month to coordinate care. Only one practitioner may furnish and bill the service in a calendar month. The patient may stop CCM services at amy time (effective at the end of the month) by phone call to the office staff. The patient will be responsible for cost sharing (co-pay) or up to 20% of the service fee (after annual deductible is met)  Following is a copy of your full provider care plan:   Goals Addressed             This Visit's Progress    CCM Expected Outcome:  Monitor, Self-Manage and Reduce Symptoms of Diabetes       Current Barriers:  Knowledge Deficits related to diabetes management education and benefits of controlled DM  Care Coordination needs related to denial of Dexcom and reordering of Dexcom meter, may need pharm D support in the future in a patient with DM  Chronic Disease Management support and education needs related to effective management of DM Lab Results  Component Value Date   HGBA1C 9.3 (H) 08/26/2022    Planned Interventions: Provided education to patient about basic DM disease process; Reviewed medications with patient and discussed  importance of medication adherence. The patient did well on Ozempic but cannot tolerate it now, causes GI upset. She states she wishes she could take it because it managed her DM so much better. The patient is compliant with medications. Sometimes forgets to take meal coverage. New order placed by the pcp for the Dexcom. The patient is hopeful it will be approved this time. Education and support given;        Reviewed prescribed diet with patient heart healthy/ADA diet. Discussed making sure she eats a light snack at bedtime to help with regulation of sugars during sleep times; Counseled on importance of regular laboratory monitoring as prescribed;        Discussed plans with patient for ongoing care management follow up and provided patient with direct contact information for care management team;      Provided patient with written educational materials related to hypo and hyperglycemia and importance of correct treatment. With the Dexcom meter she has had some lows in the 60's and highest she has seen is >300 after eating some pecan pie. The patient knows how to effectively manage highs and lows;       Reviewed scheduled/upcoming provider appointments including: 11-24-2022, saw pcp yesterday;         Advised patient, providing education and rationale, to check cbg when you have symptoms of low or high blood sugar and has a continuous glucose reader at this time  and record. Currently has a continuous glucose reader. The patient states she hopes the insurance will approve for  her to keep getting the Dexcom sensors. She will run out after this particular one she has on. Education and support given.        call provider for findings outside established parameters;       Review of patient status, including review of consultants reports, relevant laboratory and other test results, and medications completed;       Advised patient to discuss changes in DM health and well being with provider;      Screening for  signs and symptoms of depression related to chronic disease state;        Assessed social determinant of health barriers;    The patient is having some tinnitus in her ears that she has noticed more recently. She has a sound machine and that helps a lot most of the time but not all of the time. Will send information to the patient about the Epley maneuver to see if this possibly may help with her tinnitus she is experiencing. She states this has been going on for a while. Denies any safety concerns      Symptom Management: Take medications as prescribed   Attend all scheduled provider appointments Call provider office for new concerns or questions  call the Suicide and Crisis Lifeline: 988 call the [place] National Suicide Prevention Lifeline: 782 683 5349 or TTY: 541-013-5763 TTY 509-427-5649) to talk to a trained counselor call 1-800-273-TALK (toll free, 24 hour hotline) if experiencing a Mental Health or [place]  keep appointment with eye doctor check feet daily for cuts, sores or redness trim toenails straight across manage portion size wash and dry feet carefully every day wear comfortable, cotton socks wear comfortable, well-fitting shoes  Follow Up Plan: Telephone follow up appointment with care management team member scheduled for: 11-12-2022 at 1030 am       CCM Expected Outcome:  Monitor, Self-Manage and Reduce Symptoms of: HLD       Current Barriers:  Chronic Disease Management support and education needs related to for effective management of HLD  Planned Interventions: Provider established cholesterol goals reviewed; Counseled on importance of regular laboratory monitoring as prescribed; Provided HLD educational materials; Reviewed role and benefits of statin for ASCVD risk reduction; Discussed strategies to manage statin-induced myalgias; Reviewed importance of limiting foods high in cholesterol; Reviewed exercise goals and target of 150 minutes per  week; Screening for signs and symptoms of depression related to chronic disease state;  Assessed social determinant of health barriers;   Symptom Management: Take medications as prescribed   Attend all scheduled provider appointments Call provider office for new concerns or questions  call the Suicide and Crisis Lifeline: 988 call the [place] National Suicide Prevention Lifeline: 847-060-3289 or TTY: 619-185-9282 TTY [phone]) to talk to a trained counselor call 1-800-273-TALK (toll free, 24 hour hotline) if experiencing a Mental Health or [place]  - call for medicine refill 2 or 3 days before it runs out - take all medications exactly as prescribed - call doctor with any symptoms you believe are related to your medicine - call doctor when you experience any new symptoms - go to all doctor appointments as scheduled - adhere to prescribed diet: heart healthy/ADA diet  - develop an exercise routine  Follow Up Plan: Telephone follow up appointment with care management team member scheduled for: 11-12-2022 at 66 am       CCM Expected Outcome:  Monitor, Self-Manage, and Reduce Symptoms of Hypertension       Current Barriers:  Knowledge Deficits related to the benefits for taking blood pressures on a regular basis and recording Chronic Disease Management support and education needs related to effective management of HTN BP Readings from Last 3 Encounters:  09/23/22 133/76  08/26/22 133/71  07/29/22 133/65     Planned Interventions: Evaluation of current treatment plan related to hypertension self management and patient's adherence to plan as established by provider;   Provided education to patient re: stroke prevention, s/s of heart attack and stroke; Reviewed prescribed diet heart healthy/ADA diet  Reviewed medications with patient and discussed importance of compliance;  Counseled on the importance of exercise goals with target of 150 minutes per week Discussed  plans with patient for ongoing care management follow up and provided patient with direct contact information for care management team; Advised patient, providing education and rationale, to monitor blood pressure daily and record, calling PCP for findings outside established parameters. The patient normally does not take blood pressures at home. Has a cuff. Education and support given on the benefits of checking blood pressures on a regular basis;  Reviewed scheduled/upcoming provider appointments including: 11-24-2022 at 0900 am with the pcp Advised patient to discuss changes in her blood pressures and heart health with provider; Provided education on prescribed diet heart healthy/ADA diet ;  Discussed complications of poorly controlled blood pressure such as heart disease, stroke, circulatory complications, vision complications, kidney impairment, sexual dysfunction;  Screening for signs and symptoms of depression related to chronic disease state;  Assessed social determinant of health barriers;   Symptom Management: Take medications as prescribed   Attend all scheduled provider appointments Call provider office for new concerns or questions  call the Suicide and Crisis Lifeline: 988 call the [place] National Suicide Prevention Lifeline: 323-412-8097 or TTY: (229)647-9736 TTY 332-498-0923) to talk to a trained counselor call 1-800-273-TALK (toll free, 24 hour hotline) if experiencing a Mental Health or [place]  check blood pressure weekly learn about high blood pressure call doctor for signs and symptoms of high blood pressure develop an action plan for high blood pressure keep all doctor appointments take medications for blood pressure exactly as prescribed begin an exercise program report new symptoms to your doctor  Follow Up Plan: Telephone follow up appointment with care management team member scheduled for: 11-12-2022 at 1030 am          Patient verbalizes  understanding of instructions and care plan provided today and agrees to view in [place]. Active MyChart status and patient understanding of how to access instructions and care plan via MyChart confirmed with patient.     Telephone follow up appointment with care management team member scheduled for: 11-12-2022 at 1030 am  How to Perform the Epley Maneuver The Epley maneuver is an exercise that relieves symptoms of vertigo. Vertigo is the feeling that you or your surroundings are moving when they are not. When you feel vertigo, you may feel like the room is spinning and may have trouble walking. The Epley maneuver is used for a type of vertigo caused by a calcium deposit in a part of the inner ear. The maneuver involves changing head positions to help the deposit move out of the area. You can do this maneuver at home whenever you have symptoms of vertigo. You can repeat it in 24 hours if your vertigo has not gone away. Even though the Epley maneuver may relieve your vertigo for a few weeks, it is possible that your symptoms will return. This maneuver relieves vertigo,  but it does not relieve dizziness. What are the risks? If it is done correctly, the Epley maneuver is considered safe. Sometimes it can lead to dizziness or nausea that goes away after a short time. If you develop other symptoms--such as changes in vision, weakness, or numbness--stop doing the maneuver and call your health care provider. Supplies needed: A bed or table. A pillow. How to do the Epley maneuver     Sit on the edge of a bed or table with your back straight and your legs extended or hanging over the edge of the bed or table. Turn your head halfway toward the affected ear or side as told by your health care provider. Lie backward quickly with your head turned until you are lying flat on your back. Your head should dangle (head-hanging position). You may want to position a pillow under your shoulders. Hold this position for  at least 30 seconds. If you feel dizzy or have symptoms of vertigo, continue to hold the position until the symptoms stop. Turn your head to the opposite direction until your unaffected ear is facing down. Your head should continue to dangle. Hold this position for at least 30 seconds. If you feel dizzy or have symptoms of vertigo, continue to hold the position until the symptoms stop. Turn your whole body to the same side as your head so that you are positioned on your side. Your head will now be nearly facedown and no longer needs to dangle. Hold for at least 30 seconds. If you feel dizzy or have symptoms of vertigo, continue to hold the position until the symptoms stop. Sit back up. You can repeat the maneuver in 24 hours if your vertigo does not go away. Follow these instructions at home: For 24 hours after doing the Epley maneuver: Keep your head in an upright position. When lying down to sleep or rest, keep your head raised (elevated) with two or more pillows. Avoid excessive neck movements. Activity Do not drive or use machinery if you feel dizzy. After doing the Epley maneuver, return to your normal activities as told by your health care provider. Ask your health care provider what activities are safe for you. General instructions Drink enough fluid to keep your urine pale yellow. Do not drink alcohol. Take over-the-counter and prescription medicines only as told by your health care provider. Keep all follow-up visits. This is important. Preventing vertigo symptoms Ask your health care provider if there is anything you should do at home to prevent vertigo. He or she may recommend that you: Keep your head elevated with two or more pillows while you sleep. Do not sleep on the side of your affected ear. Get up slowly from bed. Avoid sudden movements during the day. Avoid extreme head positions or movement, such as looking up or bending over. Contact a health care provider if: Your  vertigo gets worse. You have other symptoms, including: Nausea. Vomiting. Headache. Get help right away if you: Have vision changes. Have a headache or neck pain that is severe or getting worse. Cannot stop vomiting. Have new numbness or weakness in any part of your body. These symptoms may represent a serious problem that is an emergency. Do not wait to see if the symptoms will go away. Get medical help right away. Call your local emergency services (911 in the [place] Do not drive yourself to the hospital. Summary Vertigo is the feeling that you or your surroundings are moving when they are not. The Epley  maneuver is an exercise that relieves symptoms of vertigo. If the Epley maneuver is done correctly, it is considered safe. This information is not intended to replace advice given to you by your health care provider. Make sure you discuss any questions you have with your health care provider. Document Revised: 08/06/2020 Document Reviewed: 08/06/2020 Elsevier Patient Education  [place].

## 2022-09-24 NOTE — Telephone Encounter (Signed)
Prescription has been faxed to pharmacy

## 2022-09-25 ENCOUNTER — Encounter: Payer: Self-pay | Admitting: Nurse Practitioner

## 2022-09-27 ENCOUNTER — Telehealth: Payer: Self-pay | Admitting: Nurse Practitioner

## 2022-09-27 ENCOUNTER — Other Ambulatory Visit: Payer: Self-pay | Admitting: Nurse Practitioner

## 2022-09-27 MED ORDER — DEXCOM G6 TRANSMITTER MISC: 5 refills | Status: DC

## 2022-09-27 NOTE — Telephone Encounter (Signed)
Copied from [place] [id number]. Topic: General - Other >> Sep 27, 2022  1:25 PM Ja-Kwan M wrote: Reason for CRM: Pt stated she called her pharmacy and she was told that they still have not received anything. Pt stated she also was told that they need a Rx for all 3 parts (sensor, transmitter, and receiver) for the Dexcom G7.

## 2022-09-28 ENCOUNTER — Telehealth: Payer: Self-pay | Admitting: Nurse Practitioner

## 2022-09-28 ENCOUNTER — Telehealth: Payer: Self-pay

## 2022-09-28 ENCOUNTER — Encounter: Payer: Self-pay | Admitting: Nurse Practitioner

## 2022-09-28 MED ORDER — DEXCOM G7 SENSOR MISC: 12 refills | Status: AC

## 2022-09-28 MED ORDER — DEXCOM G7 RECEIVER DEVI: 12 refills | Status: DC

## 2022-09-28 NOTE — Progress Notes (Cosign Needed)
Spoke with patient who would like to get patient assistance for Dexcom G7 sensor for Blood sugar. Patient is aware that she will have to fill out the patient assistance form which will be at the [place] office. Patient also aware that she needs proof of income to be faxed along with the application. If patient qualifies.  D[employer] will then send form for the provider to fill out and to send a hard copy Rx to be faxed to the number provided on the form.  Ethelene Hal

## 2022-09-28 NOTE — Telephone Encounter (Signed)
Prescription has been refaxed to Pharmacy

## 2022-09-28 NOTE — Telephone Encounter (Signed)
The patient has called back in stating the pharmacy received the script for a G6 and it should have been the [place]. This is for her her Dexcom glucose monitor through Medicare. She uses   [id number] [place]  - [address] Phone: [phone] Fax: [phone]    Please assist patient further

## 2022-09-29 NOTE — Telephone Encounter (Signed)
New prescriptions has been sent to pharmacy

## 2022-09-29 NOTE — Telephone Encounter (Signed)
Called patient and let her know of upcoming appt. She confirmed date/time

## 2022-10-12 ENCOUNTER — Encounter: Payer: Self-pay | Admitting: Nurse Practitioner

## 2022-10-20 DIAGNOSIS — Z794 Long term (current) use of insulin: Secondary | ICD-10-CM | POA: Diagnosis not present

## 2022-10-20 DIAGNOSIS — E785 Hyperlipidemia, unspecified: Secondary | ICD-10-CM | POA: Diagnosis not present

## 2022-10-20 DIAGNOSIS — I10 Essential (primary) hypertension: Secondary | ICD-10-CM | POA: Diagnosis not present

## 2022-10-20 DIAGNOSIS — E1159 Type 2 diabetes mellitus with other circulatory complications: Secondary | ICD-10-CM

## 2022-10-22 ENCOUNTER — Other Ambulatory Visit: Payer: Self-pay | Admitting: Nurse Practitioner

## 2022-10-22 NOTE — Telephone Encounter (Signed)
Requested medications are due for refill today.  See note  Requested medications are on the active medications list.  no  Last refill.   Future visit scheduled.   yes  Notes to clinic.  Pharmacy comment: Alternative Requested:THE PRESCRIBED MEDICATION IS NOT COVERED BY INSURANCE. PLEASE CONSIDER CHANGING TO ONE OF THE SUGGESTED COVERED ALTERNATIVES.     Requested Prescriptions  Pending Prescriptions Disp Refills   TRESIBA FLEXTOUCH 100 UNIT/ML FlexTouch Pen [Pharmacy Med Name: TRESIBA FLEXTOUCH 100 UNIT/ML]  0     Endocrinology:  Diabetes - Insulins Failed - 10/22/2022  7:28 AM      Failed - HBA1C is between 0 and 7.9 and within 180 days    HB A1C (BAYER DCA - WAIVED)  Date Value Ref Range Status  08/26/2022 9.3 (H) 4.8 - 5.6 % Final    Comment:             Prediabetes: 5.7 - 6.4          Diabetes: >6.4          Glycemic control for adults with diabetes: <7.0          Passed - Valid encounter within last 6 months    Recent Outpatient Visits           4 weeks ago Insulin dependent type 2 diabetes mellitus ([place])   [place], [place] T, NP   1 month ago Type 2 diabetes mellitus with obesity ([place])   [place], [place] T, NP   2 months ago Type 2 diabetes mellitus with obesity ([place])   [place], [place] T, NP   4 months ago Type 2 diabetes mellitus with obesity ([place])   [place], [place] T, NP   4 months ago Drug-induced acute pancreatitis without infection or necrosis   [facility], Barbaraann Faster, NP       Future Appointments             In 1 month [place], Barbaraann Faster, NP [place]

## 2022-10-25 ENCOUNTER — Other Ambulatory Visit: Payer: Self-pay | Admitting: Gastroenterology

## 2022-10-25 ENCOUNTER — Encounter: Payer: Self-pay | Admitting: Nurse Practitioner

## 2022-10-25 ENCOUNTER — Telehealth: Payer: Self-pay | Admitting: Nurse Practitioner

## 2022-10-25 MED ORDER — NOVOLOG FLEXPEN 100 UNIT/ML ~~LOC~~ SOPN: 8.0000 [IU] | PEN_INJECTOR | Freq: Three times a day (TID) | SUBCUTANEOUS | 4 refills | Status: DC

## 2022-10-26 NOTE — Telephone Encounter (Signed)
I did not mean to send this to [employer]. I meant to "done" it

## 2022-10-26 NOTE — Telephone Encounter (Signed)
Metformin: 06/01/22 #180 1 RF HCTZ: dc'd by provider J. Cannady NP 08/26/22 Jardiance: 08/25/22 #90 Telmisartan: 06/01/22 #90 1 RF   Requested Prescriptions  Refused Prescriptions Disp Refills   metFORMIN (GLUCOPHAGE) 1000 MG tablet [Pharmacy Med Name: METFORMIN HCL 1,000 MG TABLET] 180 tablet 1    Sig: TAKE 1 TABLET (1,000 MG TOTAL) BY MOUTH TWICE A DAY WITH FOOD     Endocrinology:  Diabetes - Biguanides Failed - 10/25/2022  3:21 PM      Failed - HBA1C is between 0 and 7.9 and within 180 days    HB A1C (BAYER DCA - WAIVED)  Date Value Ref Range Status  08/26/2022 9.3 (H) 4.8 - 5.6 % Final    Comment:             Prediabetes: 5.7 - 6.4          Diabetes: >6.4          Glycemic control for adults with diabetes: <7.0          Passed - Cr in normal range and within 360 days    Creatinine, Ser  Date Value Ref Range Status  08/26/2022 0.67 0.57 - 1.00 mg/dL Final         Passed - eGFR in normal range and within 360 days    GFR calc Af Amer  Date Value Ref Range Status  05/08/2020 95 >59 mL/min/1.73 Final    Comment:    **[facility] currently reports eGFR in compliance with the current**   recommendations of the [employer]. [facility] will   update reporting as new guidelines are published from the NKF-ASN   Task force.    GFR, Estimated  Date Value Ref Range Status  05/24/2022 >60 >60 mL/min Final    Comment:    (NOTE) Calculated using the CKD-EPI Creatinine Equation (2021)    eGFR  Date Value Ref Range Status  08/26/2022 94 >59 mL/min/1.73 Final         Passed - B12 Level in normal range and within 720 days    Vitamin B-12  Date Value Ref Range Status  08/25/2021 603 232 - 1,245 pg/mL Final         Passed - Valid encounter within last 6 months    Recent Outpatient Visits           1 month ago Insulin dependent type 2 diabetes mellitus ([place])   [place], Humble T, NP   2 months ago Type 2 diabetes mellitus with  obesity ([place])   [place], Williston T, NP   2 months ago Type 2 diabetes mellitus with obesity ([place])   [place], States[place] T, NP   4 months ago Type 2 diabetes mellitus with obesity ([place])   [place], Baroda T, NP   5 months ago Drug-induced acute pancreatitis without infection or necrosis   [place] Rector, Barbaraann Faster, NP       Future Appointments             In 4 weeks [place], One[place] Castle T, NP [place], [facility]            Passed - CBC within normal limits and completed in the last 12 months    WBC  Date Value Ref Range Status  05/24/2022 7.3 4.0 - 10.5 K/uL Final   RBC  Date Value Ref Range  Status  05/24/2022 4.38 3.87 - 5.11 MIL/uL Final   Hemoglobin  Date Value Ref Range Status  05/24/2022 12.8 12.0 - 15.0 g/dL Final  10/23/2021 15.1 11.1 - 15.9 g/dL Final   HCT  Date Value Ref Range Status  05/24/2022 38.8 36.0 - 46.0 % Final   Hematocrit  Date Value Ref Range Status  10/23/2021 46.4 34.0 - 46.6 % Final   MCHC  Date Value Ref Range Status  05/24/2022 33.0 30.0 - 36.0 g/dL Final   [facility]  Date Value Ref Range Status  05/24/2022 29.2 26.0 - 34.0 pg Final   MCV  Date Value Ref Range Status  05/24/2022 88.6 80.0 - 100.0 fL Final  10/23/2021 88 79 - 97 fL Final   No results found for: "PLTCOUNTKUC", "LABPLAT", "POCPLA" RDW  Date Value Ref Range Status  05/24/2022 13.0 11.5 - 15.5 % Final  10/23/2021 12.1 11.7 - 15.4 % Final          hydrochlorothiazide (HYDRODIURIL) 25 MG tablet [Pharmacy Med Name: HYDROCHLOROTHIAZIDE 25 MG TAB] 90 tablet 0    Sig: TAKE 1 TABLET (25 MG TOTAL) BY MOUTH DAILY.     Cardiovascular: Diuretics - Thiazide Passed - 10/25/2022  3:21 PM      Passed - Cr in normal range and within 180 days    Creatinine, Ser  Date Value Ref Range Status  08/26/2022 0.67 0.57 - 1.00 mg/dL Final          Passed - K in normal range and within 180 days    Potassium  Date Value Ref Range Status  08/26/2022 3.8 3.5 - 5.2 mmol/L Final         Passed - Na in normal range and within 180 days    Sodium  Date Value Ref Range Status  08/26/2022 137 134 - 144 mmol/L Final         Passed - Last BP in normal range    BP Readings from Last 1 Encounters:  09/23/22 133/76         Passed - Valid encounter within last 6 months    Recent Outpatient Visits           1 month ago Insulin dependent type 2 diabetes mellitus ([place])   [place], [place] Calm T, NP   2 months ago Type 2 diabetes mellitus with obesity ([place])   [place], Men[place] Park Terrace T, NP   2 months ago Type 2 diabetes mellitus with obesity ([place])   [place], [place] Hill T, NP   4 months ago Type 2 diabetes mellitus with obesity ([place])   [place], [place] T, NP   5 months ago Drug-induced acute pancreatitis without infection or necrosis   [place], Barbaraann Faster, NP       Future Appointments             In 4 weeks Cannady, Barbaraann Faster, NP [place], [facility]             JARDIANCE 25 MG TABS tablet [Pharmacy Med Name: JARDIANCE 25 MG TABLET] 90 tablet 0    Sig: TAKE 1 TABLET (25 MG TOTAL) BY MOUTH DAILY.     Endocrinology:  Diabetes - SGLT2 Inhibitors Failed - 10/25/2022  3:21 PM      Failed - HBA1C is between 0 and 7.9 and within 180 days    HB A1C (  BAYER DCA - WAIVED)  Date Value Ref Range Status  08/26/2022 9.3 (H) 4.8 - 5.6 % Final    Comment:             Prediabetes: 5.7 - 6.4          Diabetes: >6.4          Glycemic control for adults with diabetes: <7.0          Passed - Cr in normal range and within 360 days    Creatinine, Ser  Date Value Ref Range Status  08/26/2022 0.67 0.57 - 1.00 mg/dL Final         Passed - eGFR in normal  range and within 360 days    GFR calc Af Amer  Date Value Ref Range Status  05/08/2020 95 >59 mL/min/1.73 Final    Comment:    **[facility] currently reports eGFR in compliance with the current**   recommendations of the [employer]. [facility] will   update reporting as new guidelines are published from the NKF-ASN   Task force.    GFR, Estimated  Date Value Ref Range Status  05/24/2022 >60 >60 mL/min Final    Comment:    (NOTE) Calculated using the CKD-EPI Creatinine Equation (2021)    eGFR  Date Value Ref Range Status  08/26/2022 94 >59 mL/min/1.73 Final         Passed - Valid encounter within last 6 months    Recent Outpatient Visits           1 month ago Insulin dependent type 2 diabetes mellitus ([place])   [place], Mitchell T, NP   2 months ago Type 2 diabetes mellitus with obesity ([place])   [place], [place]blewood T, NP   2 months ago Type 2 diabetes mellitus with obesity ([place])   [place], Kingfisher T, NP   4 months ago Type 2 diabetes mellitus with obesity ([place])   [place], Fulton T, NP   5 months ago Drug-induced acute pancreatitis without infection or necrosis   [place], Brice Prairie T, NP       Future Appointments             In 4 weeks Cannady, Barbaraann Faster, NP [place], [facility]             telmisartan (MICARDIS) 80 MG tablet [Pharmacy Med Name: TELMISARTAN 80 MG TABLET] 90 tablet 1    Sig: TAKE 1 TABLET BY MOUTH EVERY DAY     Cardiovascular:  Angiotensin Receptor Blockers Passed - 10/25/2022  3:21 PM      Passed - Cr in normal range and within 180 days    Creatinine, Ser  Date Value Ref Range Status  08/26/2022 0.67 0.57 - 1.00 mg/dL Final         Passed - K in normal range and within 180 days    Potassium  Date Value Ref Range Status  08/26/2022 3.8 3.5 -  5.2 mmol/L Final         Passed - Patient is not pregnant      Passed - Last BP in normal range    BP Readings from Last 1 Encounters:  09/23/22 133/76         Passed - Valid encounter within last 6 months    Recent Outpatient Visits  1 month ago Insulin dependent type 2 diabetes mellitus ([place])   [place], Orland T, NP   2 months ago Type 2 diabetes mellitus with obesity ([place])   [place], [place] T, NP   2 months ago Type 2 diabetes mellitus with obesity ([place])   [place], Thornton T, NP   4 months ago Type 2 diabetes mellitus with obesity ([place])   [place], Deer Island T, NP   5 months ago Drug-induced acute pancreatitis without infection or necrosis   [place], Barbaraann Faster, NP       Future Appointments             In 4 weeks Cannady, Barbaraann Faster, NP [place], [facility]

## 2022-10-30 ENCOUNTER — Encounter: Payer: Self-pay | Admitting: Nurse Practitioner

## 2022-11-01 ENCOUNTER — Telehealth: Payer: Self-pay

## 2022-11-01 NOTE — Addendum Note (Signed)
Addended by: Lane Hacker on: 11/01/2022 11:34 AM   Modules accepted: Orders

## 2022-11-01 NOTE — Patient Outreach (Signed)
Care Management & Coordination Services Pharmacy Note  11/01/2022 Name:  Charlene Juarez MRN:  [id number] DOB:  November 26, 1950  Summary: -Pleasant 72 year old female presents for initial visit. Watches the news a lot. Mentioned it a few times. Likes to crochet and be in the garden (Tomatoes, squash, cucumbers, okra, eggplant). From [place] and liked eggplant.  -Daughter lives in [place]  Recommendations/Changes made from today's visit: -Already routed "High Priority" msg to PCP regarding insulin dose. Will await response  Subjective: Charlene Juarez is an 72 y.o. year old female who is a primary patient of Cannady, Barbaraann Faster, NP.  The care coordination team was consulted for assistance with disease management and care coordination needs.    Engaged with patient by telephone for initial visit.   Objective:  Lab Results  Component Value Date   CREATININE 0.67 08/26/2022   BUN 17 08/26/2022   EGFR 94 08/26/2022   GFRNONAA >60 05/24/2022   GFRAA 95 05/08/2020   NA 137 08/26/2022   K 3.8 08/26/2022   CALCIUM 9.2 08/26/2022   CO2 25 08/26/2022   GLUCOSE 158 (H) 08/26/2022    Lab Results  Component Value Date/Time   HGBA1C 9.3 (H) 08/26/2022 09:32 AM   HGBA1C 8.3 (H) 05/24/2022 12:23 AM   HGBA1C 8.9 (H) 03/05/2022 09:16 AM   MICROALBUR 10 11/23/2021 08:21 AM   MICROALBUR 10 11/17/2020 08:13 AM    Last diabetic Eye exam:  Lab Results  Component Value Date/Time   HMDIABEYEEXA No Retinopathy 12/30/2021 12:00 AM    Last diabetic Foot exam: No results found for: "HMDIABFOOTEX"   Lab Results  Component Value Date   CHOL 115 08/26/2022   HDL 47 08/26/2022   LDLCALC 46 08/26/2022   TRIG 125 08/26/2022       Latest Ref Rng & Units 08/26/2022    9:34 AM 05/26/2022   10:18 AM 05/24/2022   12:23 AM  Hepatic Function  Total Protein 6.0 - 8.5 g/dL 6.5  6.8  6.0   Albumin 3.9 - 4.9 g/dL 4.4  4.3  3.3   AST 0 - 40 IU/L 28  114  96   ALT 0 - 32 IU/L 40  162  147    Alk Phosphatase 44 - 121 IU/L 60  74  59   Total Bilirubin 0.0 - 1.2 mg/dL 0.3  0.3  0.7     Lab Results  Component Value Date/Time   TSH 2.320 10/23/2021 10:07 AM   TSH 3.090 08/25/2021 08:34 AM       Latest Ref Rng & Units 05/24/2022   12:23 AM 05/23/2022    7:36 AM 05/22/2022   10:31 PM  CBC  WBC 4.0 - 10.5 K/uL 7.3  9.0  11.6   Hemoglobin 12.0 - 15.0 g/dL 12.8  13.2  13.6   Hematocrit 36.0 - 46.0 % 38.8  39.4  40.6   Platelets 150 - 400 K/uL 200  176  197     Lab Results  Component Value Date/Time   VD25OH 38.2 08/26/2022 09:34 AM   VD25OH 43.2 11/23/2021 08:24 AM   VITAMINB12 603 08/25/2021 08:34 AM   VITAMINB12 550 11/17/2020 08:15 AM    Clinical ASCVD: Yes  The ASCVD Risk score (Arnett DK, et al., 2019) failed to calculate for the following reasons:   The valid total cholesterol range is 130 to 320 mg/dL    Other: (CHADS2VASc if Afib, MMRC or CAT for COPD, ACT, DEXA)     09/24/2022  10:29 AM 06/23/2022    8:34 AM 06/23/2022    8:09 AM  Depression screen PHQ 2/9  Decreased Interest 0 0 0  Down, Depressed, Hopeless 0 0 0  PHQ - 2 Score 0 0 0  Altered sleeping  0 0  Tired, decreased energy  0 0  Change in appetite  0 0  Feeling bad or failure about yourself   0 0  Trouble concentrating  0 0  Moving slowly or fidgety/restless  0 0  Suicidal thoughts  0 0  PHQ-9 Score  0 0  Difficult doing work/chores  Not difficult at all Not difficult at all     Social History   Tobacco Use  Smoking Status Never  Smokeless Tobacco Never   BP Readings from Last 3 Encounters:  09/23/22 133/76  08/26/22 133/71  07/29/22 133/65   Pulse Readings from Last 3 Encounters:  09/23/22 65  08/26/22 65  07/29/22 65   Wt Readings from Last 3 Encounters:  09/23/22 169 lb 6.4 oz (76.8 kg)  08/26/22 163 lb 9.6 oz (74.2 kg)  07/29/22 164 lb 11.2 oz (74.7 kg)   BMI Readings from Last 3 Encounters:  09/23/22 30.98 kg/m  08/26/22 29.92 kg/m  07/29/22 30.12 kg/m     Allergies  Allergen Reactions   Mounjaro [Tirzepatide] Nausea And Vomiting   Ozempic (0.25 Or 0.5 Mg-Dose) [Semaglutide(0.25 Or 0.61m-Dos)] Nausea And Vomiting   Codeine Sulfate Diarrhea and Nausea And Vomiting   Lisinopril Cough    Medications Reviewed Today     Reviewed by TVanita Ingles RN (Case Manager) on 09/24/22 at 1[place]umsteadvilleList Status: <None>   Medication Order Taking? Sig Documenting Provider Last Dose Status Informant  albuterol (VENTOLIN HFA) 108 (90 Base) MCG/ACT inhaler [id number] TAKE 2 PUFFS BY MOUTH EVERY 6 HOURS AS NEEDED FOR WHEEZE OR SHORTNESS OF BREATH  Patient taking differently: Inhale 2 puffs into the lungs every 6 (six) hours as needed for wheezing or shortness of breath. TAKE 2 PUFFS BY MOUTH EVERY 6 HOURS AS NEEDED FOR WHEEZE OR SHORTNESS OF BREATH   Cannady, Jolene T, NP Taking Active Self  Ascorbic Acid (VITAMIN C) 1000 MG tablet [id number] Take 1,000 mg by mouth daily. [provider] Taking Active Self  Cholecalciferol (VITAMIN D3) 1000 units CAPS [id number] Take 1,000 Units by mouth at bedtime. [provider] Taking Active Self  Continuous Blood Gluc Receiver ([id number] For continuous blood sugar monitoring as injecting insulin 4 times a day and over age 71  Dx E11.9, Z79.4 Cannady, Jolene T, NP  Active   Continuous Blood Gluc Transmit (DEXCOM G6 TRANSMITTER) MISC [id number] For continuous blood sugar monitoring as injecting insulin 4 times a day and over age 72  Dx E11.9, Z79.4 CMarnee GuarneriT, NP  Active   gabapentin (NEURONTIN) 100 MG capsule [id number] Take 1 capsule (100 mg total) by mouth 3 (three) times daily.  Patient taking differently: Take 100 mg by mouth at bedtime.   CMarnee GuarneriT, NP Taking Active Self  Ginkgo Biloba 40 MG TABS [id number] Take 1 tablet by mouth daily. [provider] Taking Active Self  glucose blood (CONTOUR TEST) test strip [id number] USE TO TEST  BLOOD SUGAR ONCE A DAY == DX E11.69 Cannady, Jolene T, NP Taking Active Self  insulin aspart (NOVOLOG FLEXPEN) 100 UNIT/ML FlexPen [id number] Inject 5 Units into the skin 3 (three) times daily with meals. Cannady, Jolene  T, NP Taking Active   insulin detemir (LEVEMIR FLEXPEN) 100 UNIT/ML FlexPen [id number] No Inject 50 Units into the skin at bedtime. Marnee Guarneri T, NP Taking Active   Insulin Pen Needle 31G X 8 MM MISC [id number] No Use one needle daily to inject insulin. Marnee Guarneri T, NP Taking Active   JARDIANCE 25 MG TABS tablet [id number] No TAKE 1 TABLET (25 MG TOTAL) BY MOUTH DAILY. Marnee Guarneri T, NP Taking Active   Magnesium 500 MG TABS [id number] No Take 500 mg by mouth at bedtime. [provider] Taking Active Self  metFORMIN (GLUCOPHAGE) 1000 MG tablet [id number] No TAKE 1 TABLET (1,000 MG TOTAL) BY MOUTH 2 (TWO) TIMES DAILY WITH A MEAL. Marnee Guarneri T, NP Taking Active   Omega-3 Fatty Acids (FISH OIL) 1200 MG CAPS [id number] No Take 1,200 mg by mouth daily. [provider] Taking Active Self           Med Note (HILL, TIFFANY A   Fri Oct 14, 2017  2:20 PM)    pantoprazole (PROTONIX) 20 MG tablet [id number] No TAKE 1 TABLET BY MOUTH TWICE A DAY Jonathon Bellows, MD Taking Active   polyethylene glycol (MIRALAX / GLYCOLAX) 17 g packet [id number] No Take 17 g by mouth daily as needed for moderate constipation. Kayleen Memos, [place] Taking Active   rosuvastatin (CRESTOR) 20 MG tablet [id number] No TAKE 1 TABLET BY MOUTH EVERY DAY Marnee Guarneri T, NP Taking Active   SYMBICORT 160-4.5 MCG/ACT inhaler [id number] No TAKE 2 PUFFS BY MOUTH TWICE A DAY  Patient taking differently: Inhale 2 puffs into the lungs 2 (two) times daily as needed (shortness of breath).   Marnee Guarneri T, NP Taking Active Self  telmisartan (MICARDIS) 80 MG tablet [id number] No TAKE 1 TABLET BY MOUTH EVERY DAY Cannady, Jolene T, NP Taking Active             SDOH:  (Social Determinants of Health)  assessments and interventions performed: Yes SDOH Interventions    Flowsheet Row Care Coordination from 11/08/2022 in [place] Management from 09/24/2022 in [place] from 11/17/2021 in [place] Office Visit from 08/25/2021 in [place] Interventions      Food Insecurity Interventions -- Intervention Not Indicated Intervention Not Indicated --  Housing Interventions -- Intervention Not Indicated Intervention Not Indicated --  Transportation Interventions Intervention Not Indicated Intervention Not Indicated Intervention Not Indicated --  Utilities Interventions -- Intervention Not Indicated -- --  Alcohol Usage Interventions -- Intervention Not Indicated (Score <7) -- --  Depression Interventions/Treatment  -- -- -- Medication  Financial Strain Interventions Other (Comment)  [Dexcom PAP] Intervention Not Indicated Intervention Not Indicated --  Physical Activity Interventions -- Intervention Not Indicated Intervention Not Indicated --  Stress Interventions -- Intervention Not Indicated Intervention Not Indicated --  Social Connections Interventions -- Intervention Not Indicated Intervention Not Indicated --       Medication Assistance:   CGM: -2024: Started process Feb 2024   Name and location of Current pharmacy[facility]  [id number] [place] [address] [place]: [phone]: [phone]  Assessment/Plan   Hypertension (BP goal <140/90) BP Readings from Last 3 Encounters:  09/23/22 133/76  08/26/22 133/71  07/29/22 133/65  -Controlled -Current treatment: Telmisartan 858mQD Appropriate, Effective, Safe, Accessible -Medications previously tried: HCTZ  -Current home readings:  Feb 2024:  Didn't have numbers on her -Current dietary habits: Drinks a lot of coffee and tea throughout the day -Current  exercise habits: None -Denies hypotensive/hypertensive symptoms -Educated on BP goals and benefits of medications for prevention of heart attack, stroke and kidney damage; -Counseled to monitor BP at home daily, document, and provide log at future appointments -Recommended to continue current medication  Hyperlipidemia: (LDL goal < 70) The ASCVD Risk score (Arnett DK, et al., 2019) failed to calculate for the following reasons:   The valid total cholesterol range is 130 to 320 mg/dL Lab Results  Component Value Date   CHOL 115 08/26/2022   CHOL 135 11/23/2021   CHOL 110 05/26/2021   Lab Results  Component Value Date   HDL 47 08/26/2022   HDL 53 11/23/2021   HDL 52 05/26/2021   Lab Results  Component Value Date   LDLCALC 46 08/26/2022   LDLCALC 59 11/23/2021   LDLCALC 36 05/26/2021   Lab Results  Component Value Date   TRIG 125 08/26/2022   TRIG 131 11/23/2021   TRIG 122 05/26/2021   No results found for: "CHOLHDL" No results found for: "LDLDIRECT" Last vitamin D Lab Results  Component Value Date   VD25OH 38.2 08/26/2022   Lab Results  Component Value Date   TSH 2.320 10/23/2021   -Controlled -Current treatment: Rosuvastatin 33m Appropriate, Effective, Safe, Accessible -Medications previously tried: N/A  -Current dietary patterns: eats lots of sweets/baked goods -Current exercise habits: none -Educated on Cholesterol goals;  -Recommended to continue current medication  Diabetes (A1c goal <8%) Lab Results  Component Value Date   HGBA1C 9.3 (H) 08/26/2022   HGBA1C 8.3 (H) 05/24/2022   HGBA1C 8.9 (H) 03/05/2022   Lab Results  Component Value Date   MICROALBUR 10 11/23/2021   LDLCALC 46 08/26/2022   CREATININE 0.67 08/26/2022    Lab Results  Component Value Date   NA 137 08/26/2022   K 3.8 08/26/2022   CREATININE 0.67 08/26/2022   EGFR 94 08/26/2022   GFRNONAA >60 05/24/2022   GLUCOSE 158 (H) 08/26/2022    Lab Results  Component Value Date    WBC 7.3 05/24/2022   HGB 12.8 05/24/2022   HCT 38.8 05/24/2022   MCV 88.6 05/24/2022   PLT 200 05/24/2022    Lab Results  Component Value Date   LABMICR See below: 08/26/2022   MICROALBUR 10 11/23/2021   MICROALBUR 10 11/17/2020   -Uncontrolled -Current medications: DEXCOM Appropriate, Effective, Safe, Query accessible Feb 2024: Can't afford Aspart 8-10 units TID PRN (Patient only eating BID) Appropriate, Query effective, Safe, Accessible Tresiba 50 units Query Appropriate,  Wt Readings from Last 3 Encounters:  09/23/22 169 lb 6.4 oz (76.8 kg)  08/26/22 163 lb 9.6 oz (74.2 kg)  07/29/22 164 lb 11.2 oz (74.7 kg)  MDD Long acting insulin: 37-28 units Levemir 32 units Query Appropriate,  Feb 2024: Patient has extra of this and is taking it until she runs out. Then she'll switch to T[place]ig[place] Barb[place]Jardiance 296mAppropriate, Query effective,  Metformin 100035mID Appropriate, Query effective,  -Medications previously tried: GLP1(-)  -Current home glucose readings fasting glucose:  Feb 2024: Didn't have list on her 11/01/22: Highest: 212 Morning: a little over 100 Highest in past week: 212 -Denies hypoglycemic/hyperglycemic symptoms -Current meal patterns:  Breakfast: Coffee with pure cream and 3 scoops of Allulose (Appears to be fine with DM). Boiled egg and scrambled egg and sausage. Sometimes has a biscuit. Lunch: hamburger, lettuce, tomato, and bread Dinner: Didn't  eat yesterday but ate some pecans/walnuts Snacks: Cookies, no soda, loves regular tea, and lemonade (Tries with Splenda) -Current exercise: None -Educated on A1c and blood sugar goals; -Counseled to check feet daily and get yearly eye exams Feb 2024: -Worked on Dexcom PAP for patient. This is her biggest priority -Asked patient to start eating more consistently. She states she constantly skips meals.  -Patient's medslist says Tresiba 50 units but she has leftover Levemir and is taking that at 32 units. Once she runs  out, she told me she's going to start the Tresiba at 50 and I'm afraid that'll drop her too low. Will ask PCP if we could we have her Tyler Aas be 32 units instead (Her weight is 76kg so max dose of long acting insulin is 38 units anyways).  32 units of Tresiba and increase short acting by a few units  OR  38 units of Tresiba and same short acting insulin?  Her sugars are still in the high 100's/low 200's (She didn't have her numbers on her today).   CPP F/U April 2024 (PCP next month)  [place] Constable, Pharm.D. - [phone]

## 2022-11-01 NOTE — Patient Outreach (Signed)
Spoke with patient.   Tresiba 32 units  Novolog: 10-12   Patient agrees

## 2022-11-01 NOTE — Progress Notes (Cosign Needed)
Care Management & Coordination Services Pharmacy Team  Reason for Encounter: Patient assistance     The application was downloaded on behalf of the patient. Patient filled out application and it was faxed to [facility] on 10/20/2022. Called to check status of PAP application, it was not received. Resubmitted application [id number].  Will call back on 11/03/22 to check status again. Spoke with patient to let her know that application had to be resubmitted.  Charlene Juarez

## 2022-11-05 ENCOUNTER — Encounter: Payer: Self-pay | Admitting: Nurse Practitioner

## 2022-11-08 ENCOUNTER — Ambulatory Visit: Payer: Medicare Other

## 2022-11-08 ENCOUNTER — Other Ambulatory Visit: Payer: Self-pay

## 2022-11-12 ENCOUNTER — Telehealth: Payer: Medicare Other

## 2022-11-12 ENCOUNTER — Ambulatory Visit (INDEPENDENT_AMBULATORY_CARE_PROVIDER_SITE_OTHER): Payer: Medicare Other

## 2022-11-12 DIAGNOSIS — I152 Hypertension secondary to endocrine disorders: Secondary | ICD-10-CM

## 2022-11-12 DIAGNOSIS — E669 Obesity, unspecified: Secondary | ICD-10-CM

## 2022-11-12 DIAGNOSIS — Z794 Long term (current) use of insulin: Secondary | ICD-10-CM

## 2022-11-12 NOTE — Patient Instructions (Signed)
Please call the care guide team at [phone] if you need to cancel or reschedule your appointment.   If you are experiencing a Mental Health or [place] or need someone to talk to, please call the Suicide and Crisis Lifeline: 988 call the [place] National Suicide Prevention Lifeline: (406)128-0867 or TTY: (614)395-1686 TTY (360)652-8013) to talk to a trained counselor call 1-800-273-TALK (toll free, 24 hour hotline)   Following is a copy of the CCM Program Consent:  CCM service includes personalized support from designated clinical staff supervised by the physician, including individualized plan of care and coordination with other care providers 24/7 contact phone numbers for assistance for urgent and routine care needs. Service will only be billed when office clinical staff spend 20 minutes or more in a month to coordinate care. Only one practitioner may furnish and bill the service in a calendar month. The patient may stop CCM services at amy time (effective at the end of the month) by phone call to the office staff. The patient will be responsible for cost sharing (co-pay) or up to 20% of the service fee (after annual deductible is met)  Following is a copy of your full provider care plan:   Goals Addressed             This Visit's Progress    CCM Expected Outcome:  Monitor, Self-Manage and Reduce Symptoms of Diabetes       Current Barriers:  Knowledge Deficits related to diabetes management education and benefits of controlled DM  Care Coordination needs related to denial of Dexcom and reordering of Dexcom meter, may need pharm D support in the future in a patient with DM  Chronic Disease Management support and education needs related to effective management of DM Lab Results  Component Value Date   HGBA1C 9.3 (H) 08/26/2022    Planned Interventions: Provided education to patient about basic DM disease process. The patient is concerned about her DM. She is hopeful  to get the dexcom sensors approved for use and is waiting to hear back on this. She says it holds her more accountable because she can see what her blood sugars are doing on a consistent basis. Education and support given. ; Reviewed medications with patient and discussed importance of medication adherence. The patient has been frustrated with all of the insurance changes and what the insurance will and will not pay for. The patient wants to ask the pcp about going back on Mounjaro at a lower dose to see if she can tolerate it. She was having issues when she was at 7.'5mg'$ . Will collaborate with the pcp concerning the patient request. The patient has an upcoming appointment with the pcp on 11-24-2022 and encouraged the patient to discuss with the pcp this.  She feels like she can control her blood sugars better with having Mounjaro on board.         Reviewed prescribed diet with patient heart healthy/ADA diet. Discussed making sure she eats a light snack at bedtime to help with regulation of sugars during sleep times. She is eating snacks at bedtime and this is helping keep her blood sugars stable during the night; Counseled on importance of regular laboratory monitoring as prescribed. The patient will have new blood work coming up. The patient states that she is hopeful that her A1C will be down;        Discussed plans with patient for ongoing care management follow up and provided patient with direct contact information for care  management team;      Provided patient with written educational materials related to hypo and hyperglycemia and importance of correct treatment. With the Dexcom meter she has had some lows in the less than 50 and highest she has seen is 229. Currently she is doing finger sticks and her range in the morning is 80-90 and afternoon 160 to a little over 200. The patient knows how to effectively manage her hypo and hyperglycemia. Recommendations provided.     Reviewed scheduled/upcoming  provider appointments including: 11-24-2022, saw pcp yesterday;         Advised patient, providing education and rationale, to check cbg when you have symptoms of low or high blood sugar and has a continuous glucose reader at this time  and record. Currently is doing finger sticks. The patient states she hopes the insurance will approve for her to keep getting the Dexcom sensors. She is still waiting on approval by insurance for the [facility] sensors. A company in [facility] is working with her on it.     call provider for findings outside established parameters;       Review of patient status, including review of consultants reports, relevant laboratory and other test results, and medications completed;       Advised patient to discuss changes in DM health and well being with provider;      Screening for signs and symptoms of depression related to chronic disease state;        Assessed social determinant of health barriers;    The patient is having some tinnitus in her ears that she has noticed more recently. She has a sound machine and that helps a lot most of the time but not all of the time. Will send information to the patient about the Epley maneuver to see if this possibly may help with her tinnitus she is experiencing. She states this has been going on for a while. Denies any safety concerns      Symptom Management: Take medications as prescribed   Attend all scheduled provider appointments Call provider office for new concerns or questions  call the Suicide and Crisis Lifeline: 988 call the [place] National Suicide Prevention Lifeline: 903-858-1600 or TTY: 574 519 7620 TTY 5164216309) to talk to a trained counselor call 1-800-273-TALK (toll free, 24 hour hotline) if experiencing a Mental Health or [place]  keep appointment with eye doctor check feet daily for cuts, sores or redness trim toenails straight across manage portion size wash and dry feet carefully every day wear  comfortable, cotton socks wear comfortable, well-fitting shoes  Follow Up Plan: Telephone follow up appointment with care management team member scheduled for: 01-14-2023 at 1030 am       CCM Expected Outcome:  Monitor, Self-Manage, and Reduce Symptoms of Hypertension       Current Barriers:  Knowledge Deficits related to the benefits for taking blood pressures on a regular basis and recording Chronic Disease Management support and education needs related to effective management of HTN BP Readings from Last 3 Encounters:  09/23/22 133/76  08/26/22 133/71  07/29/22 133/65     Planned Interventions: Evaluation of current treatment plan related to hypertension self management and patient's adherence to plan as established by provider. The patient is doing well with management of her blood pressures.  Denies any acute changes in her HTN or heart health;   Provided education to patient re: stroke prevention, s/s of heart attack and stroke. Education and support; Reviewed prescribed diet heart healthy/ADA  diet. The patient is mindful of her dietary intake. The patient watches what she eats. She is compliant with dietary restrictions.   Reviewed medications with patient and discussed importance of compliance. The patient is compliant with medications. Working with the pharm D for assistance with medications.;  Counseled on the importance of exercise goals with target of 150 minutes per week. The patient is active and loves to garden and do arrangements with flowers Discussed plans with patient for ongoing care management follow up and provided patient with direct contact information for care management team; Advised patient, providing education and rationale, to monitor blood pressure daily and record, calling PCP for findings outside established parameters. The patient normally does not take blood pressures at home. Has a cuff. Education and support given on the benefits of checking blood pressures  on a regular basis;  Reviewed scheduled/upcoming provider appointments including: 11-24-2022 at 0900 am with the pcp Advised patient to discuss changes in her blood pressures and heart health with provider, reminder provided today; Provided education on prescribed diet heart healthy/ADA diet. Talked about eating smaller more frequent meals to keep her chronic conditions stable ;  Discussed complications of poorly controlled blood pressure such as heart disease, stroke, circulatory complications, vision complications, kidney impairment, sexual dysfunction;  Screening for signs and symptoms of depression related to chronic disease state;  Assessed social determinant of health barriers;   Symptom Management: Take medications as prescribed   Attend all scheduled provider appointments Call provider office for new concerns or questions  call the Suicide and Crisis Lifeline: 988 call the [place] National Suicide Prevention Lifeline: (507) 398-9146 or TTY: (306)776-9169 TTY 727-355-4898) to talk to a trained counselor call 1-800-273-TALK (toll free, 24 hour hotline) if experiencing a Mental Health or [place]  check blood pressure weekly learn about high blood pressure call doctor for signs and symptoms of high blood pressure develop an action plan for high blood pressure keep all doctor appointments take medications for blood pressure exactly as prescribed begin an exercise program report new symptoms to your doctor  Follow Up Plan: Telephone follow up appointment with care management team member scheduled for: 01-14-2023 at 1030 am          Patient verbalizes understanding of instructions and care plan provided today and agrees to view in [place]. Active MyChart status and patient understanding of how to access instructions and care plan via MyChart confirmed with patient.     Telephone follow up appointment with care management team member scheduled for: 01-14-2023 at 72 am

## 2022-11-12 NOTE — Chronic Care Management (AMB) (Signed)
Chronic Care Management   CCM RN Visit Note  11/12/2022 Name: Charlene Juarez MRN: [id number] DOB: 1951-06-30  Subjective: Charlene Juarez is a 72 y.o. year old female who is a primary care patient of Cannady, Barbaraann Faster, NP. The patient was referred to the Chronic Care Management team for assistance with care management needs subsequent to provider initiation of CCM services and plan of care.    Today's Visit:  Engaged with patient by telephone for follow up visit.        Goals Addressed             This Visit's Progress    CCM Expected Outcome:  Monitor, Self-Manage and Reduce Symptoms of Diabetes       Current Barriers:  Knowledge Deficits related to diabetes management education and benefits of controlled DM  Care Coordination needs related to denial of Dexcom and reordering of Dexcom meter, may need pharm D support in the future in a patient with DM  Chronic Disease Management support and education needs related to effective management of DM Lab Results  Component Value Date   HGBA1C 9.3 (H) 08/26/2022    Planned Interventions: Provided education to patient about basic DM disease process. The patient is concerned about her DM. She is hopeful to get the dexcom sensors approved for use and is waiting to hear back on this. She says it holds her more accountable because she can see what her blood sugars are doing on a consistent basis. Education and support given. ; Reviewed medications with patient and discussed importance of medication adherence. The patient has been frustrated with all of the insurance changes and what the insurance will and will not pay for. The patient wants to ask the pcp about going back on Mounjaro at a lower dose to see if she can tolerate it. She was having issues when she was at 7.'5mg'$ . Will collaborate with the pcp concerning the patient request. The patient has an upcoming appointment with the pcp on 11-24-2022 and encouraged the patient to  discuss with the pcp this.  She feels like she can control her blood sugars better with having Mounjaro on board.         Reviewed prescribed diet with patient heart healthy/ADA diet. Discussed making sure she eats a light snack at bedtime to help with regulation of sugars during sleep times. She is eating snacks at bedtime and this is helping keep her blood sugars stable during the night; Counseled on importance of regular laboratory monitoring as prescribed. The patient will have new blood work coming up. The patient states that she is hopeful that her A1C will be down;        Discussed plans with patient for ongoing care management follow up and provided patient with direct contact information for care management team;      Provided patient with written educational materials related to hypo and hyperglycemia and importance of correct treatment. With the Dexcom meter she has had some lows in the less than 50 and highest she has seen is 229. Currently she is doing finger sticks and her range in the morning is 80-90 and afternoon 160 to a little over 200. The patient knows how to effectively manage her hypo and hyperglycemia. Recommendations provided.     Reviewed scheduled/upcoming provider appointments including: 11-24-2022, saw pcp yesterday;         Advised patient, providing education and rationale, to check cbg when you have symptoms of low or high blood  sugar and has a continuous glucose reader at this time  and record. Currently is doing finger sticks. The patient states she hopes the insurance will approve for her to keep getting the Dexcom sensors. She is still waiting on approval by insurance for the [facility] sensors. A company in [facility] is working with her on it.     call provider for findings outside established parameters;       Review of patient status, including review of consultants reports, relevant laboratory and other test results, and medications completed;       Advised patient to discuss  changes in DM health and well being with provider;      Screening for signs and symptoms of depression related to chronic disease state;        Assessed social determinant of health barriers;    The patient is having some tinnitus in her ears that she has noticed more recently. She has a sound machine and that helps a lot most of the time but not all of the time. Will send information to the patient about the Epley maneuver to see if this possibly may help with her tinnitus she is experiencing. She states this has been going on for a while. Denies any safety concerns      Symptom Management: Take medications as prescribed   Attend all scheduled provider appointments Call provider office for new concerns or questions  call the Suicide and Crisis Lifeline: 988 call the [place] National Suicide Prevention Lifeline: (406)554-8512 or TTY: 514 187 4536 TTY 727 787 4077) to talk to a trained counselor call 1-800-273-TALK (toll free, 24 hour hotline) if experiencing a Mental Health or [place]  keep appointment with eye doctor check feet daily for cuts, sores or redness trim toenails straight across manage portion size wash and dry feet carefully every day wear comfortable, cotton socks wear comfortable, well-fitting shoes  Follow Up Plan: Telephone follow up appointment with care management team member scheduled for: 01-14-2023 at 1030 am       CCM Expected Outcome:  Monitor, Self-Manage, and Reduce Symptoms of Hypertension       Current Barriers:  Knowledge Deficits related to the benefits for taking blood pressures on a regular basis and recording Chronic Disease Management support and education needs related to effective management of HTN BP Readings from Last 3 Encounters:  09/23/22 133/76  08/26/22 133/71  07/29/22 133/65     Planned Interventions: Evaluation of current treatment plan related to hypertension self management and patient's adherence to plan as established  by provider. The patient is doing well with management of her blood pressures.  Denies any acute changes in her HTN or heart health;   Provided education to patient re: stroke prevention, s/s of heart attack and stroke. Education and support; Reviewed prescribed diet heart healthy/ADA diet. The patient is mindful of her dietary intake. The patient watches what she eats. She is compliant with dietary restrictions.   Reviewed medications with patient and discussed importance of compliance. The patient is compliant with medications. Working with the pharm D for assistance with medications.;  Counseled on the importance of exercise goals with target of 150 minutes per week. The patient is active and loves to garden and do arrangements with flowers Discussed plans with patient for ongoing care management follow up and provided patient with direct contact information for care management team; Advised patient, providing education and rationale, to monitor blood pressure daily and record, calling PCP for findings outside established parameters. The patient  normally does not take blood pressures at home. Has a cuff. Education and support given on the benefits of checking blood pressures on a regular basis;  Reviewed scheduled/upcoming provider appointments including: 11-24-2022 at 0900 am with the pcp Advised patient to discuss changes in her blood pressures and heart health with provider, reminder provided today; Provided education on prescribed diet heart healthy/ADA diet. Talked about eating smaller more frequent meals to keep her chronic conditions stable ;  Discussed complications of poorly controlled blood pressure such as heart disease, stroke, circulatory complications, vision complications, kidney impairment, sexual dysfunction;  Screening for signs and symptoms of depression related to chronic disease state;  Assessed social determinant of health barriers;   Symptom Management: Take medications as  prescribed   Attend all scheduled provider appointments Call provider office for new concerns or questions  call the Suicide and Crisis Lifeline: 988 call the [place] National Suicide Prevention Lifeline: 570-252-8129 or TTY: (918)560-0300 TTY (727)484-0370) to talk to a trained counselor call 1-800-273-TALK (toll free, 24 hour hotline) if experiencing a Mental Health or [place]  check blood pressure weekly learn about high blood pressure call doctor for signs and symptoms of high blood pressure develop an action plan for high blood pressure keep all doctor appointments take medications for blood pressure exactly as prescribed begin an exercise program report new symptoms to your doctor  Follow Up Plan: Telephone follow up appointment with care management team member scheduled for: 01-14-2023 at 1030 am          Plan:Telephone follow up appointment with care management team member scheduled for:  01-14-2023 at 58 am  Noreene Larsson RN, MSN, CCM RN Care Manager  Chronic Care Management Direct Number: [phone]

## 2022-11-18 DIAGNOSIS — Z794 Long term (current) use of insulin: Secondary | ICD-10-CM

## 2022-11-18 DIAGNOSIS — I10 Essential (primary) hypertension: Secondary | ICD-10-CM

## 2022-11-18 DIAGNOSIS — E1159 Type 2 diabetes mellitus with other circulatory complications: Secondary | ICD-10-CM

## 2022-11-19 ENCOUNTER — Other Ambulatory Visit: Payer: Self-pay

## 2022-11-19 ENCOUNTER — Encounter: Payer: Self-pay | Admitting: Nurse Practitioner

## 2022-11-19 ENCOUNTER — Other Ambulatory Visit: Payer: Self-pay | Admitting: Nurse Practitioner

## 2022-11-19 MED ORDER — INSULIN ASPART 100 UNIT/ML IJ SOLN
10.0000 [IU] | Freq: Three times a day (TID) | INTRAMUSCULAR | 2 refills | Status: DC
  Filled 2022-11-19 – 2022-11-22 (×3): qty 10, 28d supply, fill #0

## 2022-11-19 NOTE — Telephone Encounter (Signed)
D/C 08/26/22.

## 2022-11-21 NOTE — Patient Instructions (Signed)
Diabetes Mellitus Basics  Diabetes mellitus, or diabetes, is a long-term (chronic) disease. It occurs when the body does not properly use sugar (glucose) that is released from food after you eat. Diabetes mellitus may be caused by one or both of these problems: Your pancreas does not make enough of a hormone called insulin. Your body does not react in a normal way to the insulin that it makes. Insulin lets glucose enter cells in your body. This gives you energy. If you have diabetes, glucose cannot get into cells. This causes high blood glucose (hyperglycemia). How to treat and manage diabetes You may need to take insulin or other diabetes medicines daily to keep your glucose in balance. If you are prescribed insulin, you will learn how to give yourself insulin by injection. You may need to adjust the amount of insulin you take based on the foods that you eat. You will need to check your blood glucose levels using a glucose monitor as told by your health care provider. The readings can help determine if you have low or high blood glucose. Generally, you should have these blood glucose levels: Before meals (preprandial): 80-130 mg/dL (4.4-7.2 mmol/L). After meals (postprandial): below 180 mg/dL (10 mmol/L). Hemoglobin A1c (HbA1c) level: less than 7%. Your health care provider will set treatment goals for you. Keep all follow-up visits. This is important. Follow these instructions at home: Diabetes medicines Take your diabetes medicines every day as told by your health care provider. List your diabetes medicines here: Name of medicine: ______________________________ Amount (dose): _______________ Time (a.m./p.m.): _______________ Notes: ___________________________________ Name of medicine: ______________________________ Amount (dose): _______________ Time (a.m./p.m.): _______________ Notes: ___________________________________ Name of medicine: ______________________________ Amount (dose):  _______________ Time (a.m./p.m.): _______________ Notes: ___________________________________ Insulin If you use insulin, list the types of insulin you use here: Insulin type: ______________________________ Amount (dose): _______________ Time (a.m./p.m.): _______________Notes: ___________________________________ Insulin type: ______________________________ Amount (dose): _______________ Time (a.m./p.m.): _______________ Notes: ___________________________________ Insulin type: ______________________________ Amount (dose): _______________ Time (a.m./p.m.): _______________ Notes: ___________________________________ Insulin type: ______________________________ Amount (dose): _______________ Time (a.m./p.m.): _______________ Notes: ___________________________________ Insulin type: ______________________________ Amount (dose): _______________ Time (a.m./p.m.): _______________ Notes: ___________________________________ Managing blood glucose  Check your blood glucose levels using a glucose monitor as told by your health care provider. Write down the times that you check your glucose levels here: Time: _______________ Notes: ___________________________________ Time: _______________ Notes: ___________________________________ Time: _______________ Notes: ___________________________________ Time: _______________ Notes: ___________________________________ Time: _______________ Notes: ___________________________________ Time: _______________ Notes: ___________________________________  Low blood glucose Low blood glucose (hypoglycemia) is when glucose is at or below 70 mg/dL (3.9 mmol/L). Symptoms may include: Feeling: Hungry. Sweaty and clammy. Irritable or easily upset. Dizzy. Sleepy. Having: A fast heartbeat. A headache. A change in your vision. Numbness around the mouth, lips, or tongue. Having trouble with: Moving (coordination). Sleeping. Treating low blood glucose To treat low blood  glucose, eat or drink something containing sugar right away. If you can think clearly and swallow safely, follow the 15:15 rule: Take 15 grams of a fast-acting carb (carbohydrate), as told by your health care provider. Some fast-acting carbs are: Glucose tablets: take 3-4 tablets. Hard candy: eat 3-5 pieces. Fruit juice: drink 4 oz (120 mL). Regular (not diet) soda: drink 4-6 oz (120-180 mL). Honey or sugar: eat 1 Tbsp (15 mL). Check your blood glucose levels 15 minutes after you take the carb. If your glucose is still at or below 70 mg/dL (3.9 mmol/L), take 15 grams of a carb again. If your glucose does not go above 70 mg/dL (3.9 mmol/L) after   3 tries, get help right away. After your glucose goes back to normal, eat a meal or a snack within 1 hour. Treating very low blood glucose If your glucose is at or below 54 mg/dL (3 mmol/L), you have very low blood glucose (severe hypoglycemia). This is an emergency. Do not wait to see if the symptoms will go away. Get medical help right away. Call your local emergency services (911 in the [place] Do not drive yourself to the hospital. Questions to ask your health care provider Should I talk with a diabetes educator? What equipment will I need to care for myself at home? What diabetes medicines do I need? When should I take them? How often do I need to check my blood glucose levels? What number can I call if I have questions? When is my follow-up visit? Where can I find a support group for people with diabetes? Where to find more information American Diabetes Association: www.diabetes.org Association of Diabetes Care and Education Specialists: www.diabeteseducator.org Contact a health care provider if: Your blood glucose is at or above 240 mg/dL (13.3 mmol/L) for 2 days in a row. You have been sick or have had a fever for 2 days or more, and you are not getting better. You have any of these problems for more than 6 hours: You cannot eat or  drink. You feel nauseous. You vomit. You have diarrhea. Get help right away if: Your blood glucose is lower than 54 mg/dL (3 mmol/L). You get confused. You have trouble thinking clearly. You have trouble breathing. These symptoms may represent a serious problem that is an emergency. Do not wait to see if the symptoms will go away. Get medical help right away. Call your local emergency services (911 in the [place] Do not drive yourself to the hospital. Summary Diabetes mellitus is a chronic disease that occurs when the body does not properly use sugar (glucose) that is released from food after you eat. Take insulin and diabetes medicines as told. Check your blood glucose every day, as often as told. Keep all follow-up visits. This is important. This information is not intended to replace advice given to you by your health care provider. Make sure you discuss any questions you have with your health care provider. Document Revised: 01/08/2020 Document Reviewed: 01/08/2020 Elsevier Patient Education  2023 [employer] Inc[employer]  

## 2022-11-22 ENCOUNTER — Other Ambulatory Visit: Payer: Self-pay

## 2022-11-22 MED ORDER — INSULIN ASPART 100 UNIT/ML IJ SOLN: 10.0000 [IU] | Freq: Three times a day (TID) | INTRAMUSCULAR | 2 refills | Status: DC

## 2022-11-22 NOTE — Telephone Encounter (Signed)
Received a notice from Pharmacy about Prue, when I called pharmacy they state that they did not receive the new prescription on 11/19/22 could you please send in again .

## 2022-11-23 ENCOUNTER — Other Ambulatory Visit: Payer: Self-pay | Admitting: Nurse Practitioner

## 2022-11-23 ENCOUNTER — Telehealth: Payer: Self-pay

## 2022-11-23 ENCOUNTER — Other Ambulatory Visit: Payer: Self-pay

## 2022-11-23 MED ORDER — NOVOLOG FLEXPEN 100 UNIT/ML ~~LOC~~ SOPN: 10.0000 [IU] | PEN_INJECTOR | Freq: Three times a day (TID) | SUBCUTANEOUS | 11 refills | Status: DC

## 2022-11-23 NOTE — Telephone Encounter (Signed)
PA for Novolog Flex pen has been approved

## 2022-11-23 NOTE — Telephone Encounter (Signed)
PA started for Novolog Flexpen through Covermy meds. Awaiting on determination

## 2022-11-24 ENCOUNTER — Ambulatory Visit (INDEPENDENT_AMBULATORY_CARE_PROVIDER_SITE_OTHER): Payer: Medicare Other | Admitting: Nurse Practitioner

## 2022-11-24 ENCOUNTER — Telehealth: Payer: Self-pay

## 2022-11-24 ENCOUNTER — Other Ambulatory Visit: Payer: Self-pay

## 2022-11-24 ENCOUNTER — Encounter: Payer: Self-pay | Admitting: Nurse Practitioner

## 2022-11-24 VITALS — BP 127/74 | HR 76 | Temp 97.5°F | Ht 62.01 in | Wt 169.5 lb

## 2022-11-24 DIAGNOSIS — M85851 Other specified disorders of bone density and structure, right thigh: Secondary | ICD-10-CM | POA: Diagnosis not present

## 2022-11-24 DIAGNOSIS — E1169 Type 2 diabetes mellitus with other specified complication: Secondary | ICD-10-CM

## 2022-11-24 DIAGNOSIS — E559 Vitamin D deficiency, unspecified: Secondary | ICD-10-CM

## 2022-11-24 DIAGNOSIS — Z1211 Encounter for screening for malignant neoplasm of colon: Secondary | ICD-10-CM

## 2022-11-24 DIAGNOSIS — E6609 Other obesity due to excess calories: Secondary | ICD-10-CM | POA: Diagnosis not present

## 2022-11-24 DIAGNOSIS — Z794 Long term (current) use of insulin: Secondary | ICD-10-CM

## 2022-11-24 DIAGNOSIS — K219 Gastro-esophageal reflux disease without esophagitis: Secondary | ICD-10-CM

## 2022-11-24 DIAGNOSIS — Z0000 Encounter for general adult medical examination without abnormal findings: Secondary | ICD-10-CM

## 2022-11-24 DIAGNOSIS — K760 Fatty (change of) liver, not elsewhere classified: Secondary | ICD-10-CM | POA: Diagnosis not present

## 2022-11-24 DIAGNOSIS — E538 Deficiency of other specified B group vitamins: Secondary | ICD-10-CM | POA: Diagnosis not present

## 2022-11-24 DIAGNOSIS — J4520 Mild intermittent asthma, uncomplicated: Secondary | ICD-10-CM

## 2022-11-24 DIAGNOSIS — E785 Hyperlipidemia, unspecified: Secondary | ICD-10-CM | POA: Diagnosis not present

## 2022-11-24 DIAGNOSIS — E119 Type 2 diabetes mellitus without complications: Secondary | ICD-10-CM | POA: Diagnosis not present

## 2022-11-24 DIAGNOSIS — G4733 Obstructive sleep apnea (adult) (pediatric): Secondary | ICD-10-CM

## 2022-11-24 DIAGNOSIS — E1159 Type 2 diabetes mellitus with other circulatory complications: Secondary | ICD-10-CM

## 2022-11-24 DIAGNOSIS — I152 Hypertension secondary to endocrine disorders: Secondary | ICD-10-CM | POA: Diagnosis not present

## 2022-11-24 DIAGNOSIS — R7989 Other specified abnormal findings of blood chemistry: Secondary | ICD-10-CM | POA: Diagnosis not present

## 2022-11-24 DIAGNOSIS — E66811 Obesity, class 1: Secondary | ICD-10-CM

## 2022-11-24 DIAGNOSIS — Z6830 Body mass index (BMI) 30.0-30.9, adult: Secondary | ICD-10-CM

## 2022-11-24 LAB — MICROALBUMIN, URINE WAIVED
Creatinine, Urine Waived: 50 mg/dL (ref 10–300)
Microalb, Ur Waived: 10 mg/L (ref 0–19)

## 2022-11-24 LAB — BAYER DCA HB A1C WAIVED: HB A1C (BAYER DCA - WAIVED): 6.7 % — ABNORMAL HIGH (ref 4.8–5.6)

## 2022-11-24 NOTE — Assessment & Plan Note (Signed)
Chronic.  Noted on Dexa in 2020.  Repeat Dexa 2025.  Continue daily Vit D and calcium. Check levels today.

## 2022-11-24 NOTE — Assessment & Plan Note (Signed)
Chronic, stable.  Continue current medication regimen and adjust as needed.  Will send refills when requested as minimally uses inhalers.

## 2022-11-24 NOTE — Assessment & Plan Note (Signed)
Recheck CMP today.  Recent had improved.

## 2022-11-24 NOTE — Assessment & Plan Note (Addendum)
Chronic, ongoing.  Urine ALB 30 October 2021.  A1c 6.7% today which is trended down from previous 9.3%, pancreatitis presented with [place].  Had tolerated Ozempic well for years.  Started Novolog last visit and she is using Dexcom samples with her BS noted to be trending down. Dexcom sample applied today. - At this time will maintain off GLP1 or Mounjaro due to pancreatitis in past. - Continue Jardiance and Metformin. Continue Levemir 30 units.  Continue Novolog pre-meals 5-8 units as suspect main elevations after meals, educated her on this and not to take if does not eat a meal or take after meals -- discussed to monitor sugars closely. - Ordered DEXCOM again and will continue samples for now, waiting on assistance program for these.  Continue to work with CCM. - Check blood sugar 4-5 times a day and document for provider.   - Eye and foot exam up to date. - Statin and ARB on board. - Flu shot and Pneumonia vaccines up to date. Return in 3 months.

## 2022-11-24 NOTE — Telephone Encounter (Signed)
Gastroenterology Pre-Procedure Review  Request Date: 12/09/22 Requesting Physician: Dr. Vicente Males  PATIENT REVIEW QUESTIONS: The patient responded to the following health history questions as indicated:    1. Are you having any GI issues? no 2. Do you have a personal history of Polyps? no 3. Do you have a family history of Colon Cancer or Polyps? no 4. Diabetes Mellitus? yes (Takes Metformin and Jardiance stop on 03/18) 5. Joint replacements in the past 12 months?no 6. Major health problems in the past 3 months?no 7. Any artificial heart valves, MVP, or defibrillator?no    MEDICATIONS & ALLERGIES:    Patient reports the following regarding taking any anticoagulation/antiplatelet therapy:   Plavix, Coumadin, Eliquis, Xarelto, Lovenox, Pradaxa, Brilinta, or Effient? no Aspirin? no  Patient confirms/reports the following medications:  Current Outpatient Medications  Medication Sig Dispense Refill   albuterol (VENTOLIN HFA) 108 (90 Base) MCG/ACT inhaler TAKE 2 PUFFS BY MOUTH EVERY 6 HOURS AS NEEDED FOR WHEEZE OR SHORTNESS OF BREATH (Patient taking differently: Inhale 2 puffs into the lungs every 6 (six) hours as needed for wheezing or shortness of breath. TAKE 2 PUFFS BY MOUTH EVERY 6 HOURS AS NEEDED FOR WHEEZE OR SHORTNESS OF BREATH) 18 each 2   Ascorbic Acid (VITAMIN C) 1000 MG tablet Take 1,000 mg by mouth daily.     Cholecalciferol (VITAMIN D3) 1000 units CAPS Take 1,000 Units by mouth at bedtime.     Continuous Blood Gluc Receiver ([place]) DEVI For continuous blood sugar monitoring as injecting insulin 4 times a day and over age 40.  Dx E11.9, Z79.4 (Patient not taking: Reported on 11/24/2022) 1 each 12   Continuous Blood Gluc Receiver ([place]) DEVI For continuous blood sugar monitoring as injecting insulin 4 times a day and over age 47. Dx E11.9, Z79.4 (Patient not taking: Reported on 11/24/2022) 1 each 12   Continuous Blood Gluc Sensor (DEXCOM G7 SENSOR) MISC For  continuous blood sugar monitoring as injecting insulin 4 times a day and over age 40. Dx E11.9, Z79.4 (Patient not taking: Reported on 11/24/2022) 1 each 12   gabapentin (NEURONTIN) 100 MG capsule Take 1 capsule (100 mg total) by mouth 3 (three) times daily. (Patient taking differently: Take 100 mg by mouth at bedtime.) 90 capsule 12   Ginkgo Biloba 40 MG TABS Take 1 tablet by mouth daily.     glucose blood (CONTOUR TEST) test strip USE TO TEST BLOOD SUGAR ONCE A DAY == DX E11.69 100 strip 4   insulin aspart (NOVOLOG FLEXPEN) 100 UNIT/ML FlexPen Inject 10-12 Units into the skin 3 (three) times daily with meals. 15 mL 11   insulin degludec (TRESIBA) 100 UNIT/ML FlexTouch Pen Inject 32 Units into the skin daily.     Insulin Pen Needle 31G X 8 MM MISC Use one needle daily to inject insulin. 100 each 4   JARDIANCE 25 MG TABS tablet TAKE 1 TABLET (25 MG TOTAL) BY MOUTH DAILY. 90 tablet 0   Magnesium 500 MG TABS Take 500 mg by mouth at bedtime.     metFORMIN (GLUCOPHAGE) 1000 MG tablet TAKE 1 TABLET (1,000 MG TOTAL) BY MOUTH TWICE A DAY WITH FOOD 180 tablet 1   Omega-3 Fatty Acids (FISH OIL) 1200 MG CAPS Take 1,200 mg by mouth daily.     pantoprazole (PROTONIX) 20 MG tablet TAKE 1 TABLET BY MOUTH TWICE A DAY 180 tablet 1   polyethylene glycol (MIRALAX / GLYCOLAX) 17 g packet Take 17 g by mouth daily as  needed for moderate constipation. 14 each 0   rosuvastatin (CRESTOR) 20 MG tablet TAKE 1 TABLET BY MOUTH EVERY DAY 90 tablet 3   SYMBICORT 160-4.5 MCG/ACT inhaler TAKE 2 PUFFS BY MOUTH TWICE A DAY (Patient taking differently: Inhale 2 puffs into the lungs 2 (two) times daily as needed (shortness of breath).) 30.6 each 3   telmisartan (MICARDIS) 80 MG tablet TAKE 1 TABLET BY MOUTH EVERY DAY 90 tablet 1   No current facility-administered medications for this visit.    Patient confirms/reports the following allergies:  Allergies  Allergen Reactions   Mounjaro [Tirzepatide] Nausea And Vomiting   Ozempic  (0.25 Or 0.5 Mg-Dose) [Semaglutide(0.25 Or 0.'5mg'$ -Dos)] Nausea And Vomiting   Codeine Sulfate Diarrhea and Nausea And Vomiting   Lisinopril Cough    No orders of the defined types were placed in this encounter.   AUTHORIZATION INFORMATION Primary Insurance: 1D#: Group #:  Secondary Insurance: 1D#: Group #:  SCHEDULE INFORMATION: Date: 03/18 Time: Location: [facility]

## 2022-11-24 NOTE — Assessment & Plan Note (Signed)
Chronic, does not consistently wear CPAP.  Recommend 100% use.

## 2022-11-24 NOTE — Assessment & Plan Note (Signed)
Chronic, stable.  BP at goal in office and at home.  Continue Telmisartan for kidney and remain off HCTZ as could diminish effects of insulin == may need to consider Amlodipine if BP trends up in future.  LABS: CBC, CMP, TSH, urine ALB.  Urine ALB 28 November 2022.  Recommend she continue to check BP at home on occasion and document for visits. Focus on DASH diet.  Return in 3 months.

## 2022-11-24 NOTE — Assessment & Plan Note (Signed)
Chronic, ongoing.  Will continue Rosuvastatin daily and adjust dose as needed.  Lipid panel today.

## 2022-11-24 NOTE — Telephone Encounter (Signed)
Paper work has been faxed

## 2022-11-24 NOTE — Assessment & Plan Note (Signed)
Chronic, stable.  Recheck B12 level today and continue supplement.

## 2022-11-24 NOTE — Assessment & Plan Note (Signed)
Continue current supplement and plan for DEXA scan repeat in 2025 due to osteopenia..  Check Vit D today.

## 2022-11-24 NOTE — Assessment & Plan Note (Signed)
Chronic, noted on past imaging. At this time recommend heavy focus on diet and regular activity + working on diabetes control with goal A1c <7%.

## 2022-11-24 NOTE — Assessment & Plan Note (Signed)
Chronic, improved with Protonix.  Continue this regimen and adjust as needed.  Check Mag level annually. Risks of PPI use were discussed with patient including bone loss, C. Diff diarrhea, pneumonia, infections, CKD, electrolyte abnormalities.  Verbalizes understanding and chooses to continue the medication.

## 2022-11-24 NOTE — Assessment & Plan Note (Signed)
BMI 30.99.  Recommended eating smaller high protein, low fat meals more frequently and exercising 30 mins a day 5 times a week with a goal of 10-15lb weight loss in the next 3 months. Patient voiced their understanding and motivation to adhere to these recommendations.

## 2022-11-24 NOTE — Progress Notes (Signed)
BP 127/74   Pulse 76   Temp (!) 97.5 F (36.4 C) (Oral)   Ht 5' 2.01" (1.575 m)   Wt 169 lb 8 oz (76.9 kg)   LMP 07/13/2000 (Approximate)   SpO2 97%   BMI 30.99 kg/m    Subjective:    Patient ID: Charlene Juarez, female    DOB: Dec 14, 1950, 72 y.o.   MRN: [id number]  HPI: Charlene Juarez is a 72 y.o. female presenting on 11/24/2022 for Medicare Wellness and follow-up visit. Current medical complaints include:none  She currently lives with: husband Menopausal Symptoms: no  DIABETES A1c 9.3% December. Taking Tresiba 30 units (currently still using leftover Levemir), Jardiance 25 MG, Metformin 1000 MG BID, Novolog started pre meals on 08/26/22.  Currently taking Gabapentin 100 MG BID for neuropathy.  Can not take GLP1 or Mounjaro due to pancreatitis with these.  Continues on B12 and Vit D supplements daily.x Hypoglycemic episodes:no Polydipsia/polyuria: no Visual disturbance: no Chest pain: no Paresthesias: no Glucose Monitoring: no             Accucheck frequency: daily             Fasting glucose: 100 to 120             Post prandial:             Evening: <180 at baseline, had a couple days of over 200             Before meals: Taking Insulin?: yes             Long acting insulin: 30 units             Short acting insulin: 5 to 12 units Blood Pressure Monitoring: yes Retinal Examination: Up To Date Foot Exam: Up to Date Pneumovax: Up to Date Influenza: Up to Date Aspirin: no   HYPERTENSION / HYPERLIPIDEMIA Continues on Telmisartan + Crestor.  HCTZ was discontinued due to use of insulin. Satisfied with current treatment? yes Duration of hypertension: chronic BP monitoring frequency: not often BP range: 110/70 range at home BP medication side effects: no Duration of hyperlipidemia: chronic Cholesterol medication side effects: no Cholesterol supplements: none Medication compliance: good compliance Aspirin: no Recent stressors: no Recurrent  headaches: no Visual changes: no Palpitations: no Dyspnea: no Chest pain: no Lower extremity edema: no Dizzy/lightheaded: no   ASTHMA Taking Symbicort and Albuterol used only as needed.  Only uses when sick. Asthma status: stable Satisfied with current treatment?: yes Albuterol/rescue inhaler frequency: once a year Dyspnea frequency: no Wheezing frequency: no Cough frequency: no Nocturnal symptom frequency: no Limitation of activity: no Current upper respiratory symptoms: no Aerochamber/spacer use: no Visits to ER or Urgent Care in past year: no Pneumovax: Up to Date Influenza: Up to Date   GERD Continues on Pantoprazole.   GERD control status: stable Satisfied with current treatment? yes Heartburn frequency: none Medication side effects: no  Medication compliance: fluctuating Dysphagia: no Odynophagia:  no Hematemesis: no Blood in stool: no EGD: yes   OSTEOPENIA Last bone density 06/10/2019 with T-score -2.3. Satisfied with current treatment?: yes Adequate calcium & vitamin D: yes Weight bearing exercises: yes   Functional Status Survey: Is the patient deaf or have difficulty hearing?: No Does the patient have difficulty seeing, even when wearing glasses/contacts?: No Does the patient have difficulty concentrating, remembering, or making decisions?: No Does the patient have difficulty walking or climbing stairs?: No Does the patient have difficulty dressing or bathing?: No Does  the patient have difficulty doing errands alone such as visiting a doctor's office or shopping?: No      11/24/2022    8:42 AM 09/24/2022   10:29 AM 06/23/2022    8:34 AM 06/23/2022    8:09 AM 05/26/2022    9:43 AM  Depression screen PHQ 2/9  Decreased Interest 0 0 0 0 0  Down, Depressed, Hopeless 0 0 0 0 0  PHQ - 2 Score 0 0 0 0 0  Altered sleeping 0  0 0 1  Tired, decreased energy 0  0 0 1  Change in appetite 0  0 0 0  Feeling bad or failure about yourself  0  0 0 0  Trouble  concentrating 0  0 0 0  Moving slowly or fidgety/restless 0  0 0 0  Suicidal thoughts 0  0 0 0  PHQ-9 Score 0  0 0 2  Difficult doing work/chores Not difficult at all  Not difficult at all Not difficult at all Somewhat difficult       11/24/2022    8:42 AM 06/23/2022    8:35 AM 06/23/2022    8:09 AM 05/26/2022    9:44 AM  GAD 7 : Generalized Anxiety Score  Nervous, Anxious, on Edge 0 0 0 0  Control/stop worrying 0 0 0 0  Worry too much - different things 0 0 0 0  Trouble relaxing 0 0 0 1  Restless 0 0 0 0  Easily annoyed or irritable 0 0 0 0  Afraid - awful might happen 0 0 0 0  Total GAD 7 Score 0 0 0 1  Anxiety Difficulty Not difficult at all Not difficult at all  Extremely difficult      05/24/2022    8:15 AM 05/26/2022    9:43 AM 06/23/2022    8:09 AM 09/24/2022   10:33 AM 11/24/2022    8:42 AM  Fall Risk  Falls in the past year?  0 0 0 0  Was there an injury with Fall?  0 0 0 0  Fall Risk Category Calculator  0 0 0 0  Fall Risk Category (Retired)  Low Low Low   (RETIRED) Patient Fall Risk Level Moderate fall risk Low fall risk Low fall risk Low fall risk   Patient at Risk for Falls Due to  No Fall Risks No Fall Risks No Fall Risks No Fall Risks  Fall risk Follow up  Falls evaluation completed Falls evaluation completed Falls evaluation completed;Education provided Falls evaluation completed    Past Medical History:  Past Medical History:  Diagnosis Date   Arthritis    Bursitis    Cancer ([place])    SKIN   Diabetes mellitus without complication ([place])    GERD (gastroesophageal reflux disease)    Hyperlipidemia    Hypertension    Sleep apnea     Surgical History:  Past Surgical History:  Procedure Laterality Date   cancer removal off nose     CATARACT EXTRACTION W/PHACO Right 01/13/2017   Procedure: CATARACT EXTRACTION PHACO AND INTRAOCULAR LENS PLACEMENT ([place]);  Surgeon: Eulogio Bear, MD;  Location: [facility] ORS;  Service: Ophthalmology;  Laterality: Right;  [place] 31.1 AP%  0.0 CDE 1.78 Fluid Pack lot # [id number]   CATARACT EXTRACTION W/PHACO Left 02/24/2017   Procedure: CATARACT EXTRACTION PHACO AND INTRAOCULAR LENS PLACEMENT ([facility]);  Surgeon: Eulogio Bear, MD;  Location: [facility] ORS;  Service: Ophthalmology;  Laterality: Left;  [place]  00:45.5 AP  0.7 CDE  2.76  fluid pack lot # [id number]  exp. 08-19-2018   DILATION AND CURETTAGE OF UTERUS     ESOPHAGOGASTRODUODENOSCOPY (EGD) WITH PROPOFOL N/A 01/06/2022   Procedure: ESOPHAGOGASTRODUODENOSCOPY (EGD) WITH PROPOFOL;  Surgeon: Jonathon Bellows, MD;  Location: [facility] ENDOSCOPY;  Service: Gastroenterology;  Laterality: N/A;   EYE SURGERY     rotator cuff surgery     TUBAL LIGATION      Medications:  Current Outpatient Medications on File Prior to Visit  Medication Sig   albuterol (VENTOLIN HFA) 108 (90 Base) MCG/ACT inhaler TAKE 2 PUFFS BY MOUTH EVERY 6 HOURS AS NEEDED FOR WHEEZE OR SHORTNESS OF BREATH (Patient taking differently: Inhale 2 puffs into the lungs every 6 (six) hours as needed for wheezing or shortness of breath. TAKE 2 PUFFS BY MOUTH EVERY 6 HOURS AS NEEDED FOR WHEEZE OR SHORTNESS OF BREATH)   Ascorbic Acid (VITAMIN C) 1000 MG tablet Take 1,000 mg by mouth daily.   Cholecalciferol (VITAMIN D3) 1000 units CAPS Take 1,000 Units by mouth at bedtime.   gabapentin (NEURONTIN) 100 MG capsule Take 1 capsule (100 mg total) by mouth 3 (three) times daily. (Patient taking differently: Take 100 mg by mouth at bedtime.)   Ginkgo Biloba 40 MG TABS Take 1 tablet by mouth daily.   glucose blood (CONTOUR TEST) test strip USE TO TEST BLOOD SUGAR ONCE A DAY == DX E11.69   insulin aspart (NOVOLOG FLEXPEN) 100 UNIT/ML FlexPen Inject 10-12 Units into the skin 3 (three) times daily with meals.   insulin degludec (TRESIBA) 100 UNIT/ML FlexTouch Pen Inject 32 Units into the skin daily.   Insulin Pen Needle 31G X 8 MM MISC Use one needle daily to inject insulin.   JARDIANCE 25 MG TABS tablet TAKE 1 TABLET (25 MG TOTAL) BY MOUTH  DAILY.   Magnesium 500 MG TABS Take 500 mg by mouth at bedtime.   metFORMIN (GLUCOPHAGE) 1000 MG tablet TAKE 1 TABLET (1,000 MG TOTAL) BY MOUTH TWICE A DAY WITH FOOD   Omega-3 Fatty Acids (FISH OIL) 1200 MG CAPS Take 1,200 mg by mouth daily.   pantoprazole (PROTONIX) 20 MG tablet TAKE 1 TABLET BY MOUTH TWICE A DAY   polyethylene glycol (MIRALAX / GLYCOLAX) 17 g packet Take 17 g by mouth daily as needed for moderate constipation.   rosuvastatin (CRESTOR) 20 MG tablet TAKE 1 TABLET BY MOUTH EVERY DAY   SYMBICORT 160-4.5 MCG/ACT inhaler TAKE 2 PUFFS BY MOUTH TWICE A DAY (Patient taking differently: Inhale 2 puffs into the lungs 2 (two) times daily as needed (shortness of breath).)   telmisartan (MICARDIS) 80 MG tablet TAKE 1 TABLET BY MOUTH EVERY DAY   Continuous Blood Gluc Receiver ([place]) DEVI For continuous blood sugar monitoring as injecting insulin 4 times a day and over age 81.  Dx E11.9, Z79.4 (Patient not taking: Reported on 11/24/2022)   Continuous Blood Gluc Receiver ([place]) DEVI For continuous blood sugar monitoring as injecting insulin 4 times a day and over age 89. Dx E11.9, Z79.4 (Patient not taking: Reported on 11/24/2022)   Continuous Blood Gluc Sensor (DEXCOM G7 SENSOR) MISC For continuous blood sugar monitoring as injecting insulin 4 times a day and over age 64. Dx E11.9, Z79.4 (Patient not taking: Reported on 11/24/2022)   No current facility-administered medications on file prior to visit.    Allergies:  Allergies  Allergen Reactions   Mounjaro [Tirzepatide] Nausea And Vomiting   Ozempic (0.25 Or 0.5 Mg-Dose) [Semaglutide(0.25 Or 0.'5mg'$ -Dos)] Nausea And Vomiting  Codeine Sulfate Diarrhea and Nausea And Vomiting   Lisinopril Cough    Social History:  Social History   Socioeconomic History   Marital status: Married    Spouse name: Not on file   Number of children: 4   Years of education: Not on file   Highest education level: Associate degree:  occupational, Hotel manager, or vocational program  Occupational History   Occupation: retired Charity fundraiser  Tobacco Use   Smoking status: Never   Smokeless tobacco: Never  Vaping Use   Vaping Use: Never used  Substance and Sexual Activity   Alcohol use: No    Alcohol/week: 0.0 standard drinks of alcohol   Drug use: Never   Sexual activity: Yes  Other Topics Concern   Not on file  Social History Narrative   Not on file   Social Determinants of Health   Financial Resource Strain: Medium Risk (11/24/2022)   Overall Financial Resource Strain (CARDIA)    Difficulty of Paying Living Expenses: Somewhat hard  Food Insecurity: No Food Insecurity (11/24/2022)   Hunger Vital Sign    Worried About Running Out of Food in the Last Year: Never true    Ran Out of Food in the Last Year: Never true  Transportation Needs: No Transportation Needs (11/24/2022)   PRAPARE - Hydrologist (Medical): No    Lack of Transportation (Non-Medical): No  Physical Activity: Insufficiently Active (11/24/2022)   Exercise Vital Sign    Days of Exercise per Week: 2 days    Minutes of Exercise per Session: 20 min  Stress: No Stress Concern Present (11/24/2022)   [place]    Feeling of Stress : Not at all  Social Connections: Moderately Integrated (11/24/2022)   Social Connection and Isolation Panel [NHANES]    Frequency of Communication with Friends and Family: More than three times a week    Frequency of Social Gatherings with Friends and Family: More than three times a week    Attends Religious Services: More than 4 times per year    Active Member of [employer] or Organizations: No    Attends Archivist Meetings: Never    Marital Status: Married  Human resources officer Violence: Not At Risk (11/24/2022)   Humiliation, Afraid, Rape, and Kick questionnaire    Fear of Current or Ex-Partner: No    Emotionally Abused: No    Physically  Abused: No    Sexually Abused: No   Social History   Tobacco Use  Smoking Status Never  Smokeless Tobacco Never   Social History   Substance and Sexual Activity  Alcohol Use No   Alcohol/week: 0.0 standard drinks of alcohol    Family History:  Family History  Problem Relation Age of Onset   Alcohol abuse Mother    Heart disease Father    Alcohol abuse Father    Diabetes Sister    Hyperlipidemia Sister    Diabetes Brother    Hypertension Brother    Thyroid disease Daughter    Diabetes Maternal Grandmother    Hypertension Sister    Aneurysm Sister    Diabetes Sister    Diabetes Sister    Breast cancer Maternal Aunt     Past medical history, surgical history, medications, allergies, family history and social history reviewed with patient today and changes made to appropriate areas of the chart.   ROS All other ROS negative except what is listed above and in the HPI.  Objective:    BP 127/74   Pulse 76   Temp (!) 97.5 F (36.4 C) (Oral)   Ht 5' 2.01" (1.575 m)   Wt 169 lb 8 oz (76.9 kg)   LMP 07/13/2000 (Approximate)   SpO2 97%   BMI 30.99 kg/m   Wt Readings from Last 3 Encounters:  11/24/22 169 lb 8 oz (76.9 kg)  09/23/22 169 lb 6.4 oz (76.8 kg)  08/26/22 163 lb 9.6 oz (74.2 kg)    Physical Exam Vitals and nursing note reviewed.  Constitutional:      General: She is awake. She is not in acute distress.    Appearance: She is well-developed and well-groomed. She is obese. She is not ill-appearing or toxic-appearing.  HENT:     Head: Normocephalic.  Eyes:     General: Lids are normal. No scleral icterus.    Pupils: Pupils are equal, round, and reactive to light.  Neck:     Vascular: No carotid bruit.  Cardiovascular:     Rate and Rhythm: Normal rate and regular rhythm.     Pulses: Normal pulses.     Heart sounds: Normal heart sounds. No murmur heard.    No gallop.  Pulmonary:     Effort: Pulmonary effort is normal. No accessory muscle usage or  respiratory distress.     Breath sounds: Normal breath sounds.  Abdominal:     General: Bowel sounds are normal. There is no distension.     Palpations: Abdomen is soft.     Tenderness: There is no abdominal tenderness.  Musculoskeletal:     Cervical back: Full passive range of motion without pain.     Right lower leg: No edema.     Left lower leg: No edema.  Skin:    General: Skin is warm and dry.  Neurological:     Mental Status: She is alert.     Deep Tendon Reflexes: Reflexes are normal and symmetric.     Reflex Scores:      Bicep reflexes are 2+ on the right side and 2+ on the left side.      Brachioradialis reflexes are 2+ on the right side and 2+ on the left side. Psychiatric:        Attention and Perception: Attention normal.        Mood and Affect: Mood normal.        Speech: Speech normal.        Behavior: Behavior is cooperative.        Thought Content: Thought content normal.       11/24/2022    9:42 AM 11/14/2020    1:56 PM 10/31/2019   11:12 AM  6CIT Screen  What Year? 0 points 0 points 0 points  What month? 0 points 0 points 0 points  What time? 0 points 0 points 0 points  Count back from 20 0 points 0 points 0 points  Months in reverse 0 points 0 points 0 points  Repeat phrase 0 points 0 points 0 points  Total Score 0 points 0 points 0 points    Diabetic Foot Exam - Simple   Simple Foot Form Visual Inspection No deformities, no ulcerations, no other skin breakdown bilaterally: Yes Sensation Testing Intact to touch and monofilament testing bilaterally: Yes Pulse Check Posterior Tibialis and Dorsalis pulse intact bilaterally: Yes Comments     Results for orders placed or performed in visit on 11/24/22  Bayer DCA Hb A1c Waived  Result Value Ref Range  HB A1C (BAYER DCA - WAIVED) 6.7 (H) 4.8 - 5.6 %  Microalbumin, Urine Waived  Result Value Ref Range   Microalb, Ur Waived 10 0 - 19 mg/L   Creatinine, Urine Waived 50 10 - 300 mg/dL   Microalb/Creat  Ratio 30-300 (H) <30 mg/g      Assessment & Plan:   Problem List Items Addressed This Visit       Cardiovascular and Mediastinum   Hypertension associated with diabetes (HCC)    Chronic, stable.  BP at goal in office and at home.  Continue Telmisartan for kidney and remain off HCTZ as could diminish effects of insulin == may need to consider Amlodipine if BP trends up in future.  LABS: CBC, CMP, TSH, urine ALB.  Urine ALB 28 November 2022.  Recommend she continue to check BP at home on occasion and document for visits. Focus on DASH diet.  Return in 3 months.      Relevant Orders   Bayer DCA Hb A1c Waived (Completed)   Microalbumin, Urine Waived (Completed)   Comprehensive metabolic panel   TSH     Respiratory   Asthma, intermittent    Chronic, stable.  Continue current medication regimen and adjust as needed.  Will send refills when requested as minimally uses inhalers.      Relevant Orders   CBC with Differential/Platelet   VITAMIN D 25 Hydroxy (Vit-D Deficiency, Fractures)   OSA (obstructive sleep apnea)    Chronic, does not consistently wear CPAP.  Recommend 100% use.        Digestive   Fatty liver    Chronic, noted on past imaging. At this time recommend heavy focus on diet and regular activity + working on diabetes control with goal A1c <7%.      Relevant Orders   Comprehensive metabolic panel   GERD (gastroesophageal reflux disease)    Chronic, improved with Protonix.  Continue this regimen and adjust as needed.  Check Mag level annually. Risks of PPI use were discussed with patient including bone loss, C. Diff diarrhea, pneumonia, infections, CKD, electrolyte abnormalities.  Verbalizes understanding and chooses to continue the medication.       Relevant Orders   Magnesium     Endocrine   Hyperlipidemia associated with type 2 diabetes mellitus (HCC)    Chronic, ongoing.  Will continue Rosuvastatin daily and adjust dose as needed.  Lipid panel today.         Relevant Orders   Bayer DCA Hb A1c Waived (Completed)   Comprehensive metabolic panel   Lipid Panel w/o Chol/HDL Ratio   Insulin dependent type 2 diabetes mellitus (HCC)    Chronic, ongoing.  Urine ALB 30 October 2021.  A1c 6.7% today which is trended down from previous 9.3%, pancreatitis presented with [place].  Had tolerated Ozempic well for years.  Started Novolog last visit and she is using Dexcom samples with her BS noted to be trending down. Dexcom sample applied today. - At this time will maintain off GLP1 or Mounjaro due to pancreatitis in past. - Continue Jardiance and Metformin. Continue Levemir 30 units.  Continue Novolog pre-meals 5-8 units as suspect main elevations after meals, educated her on this and not to take if does not eat a meal or take after meals -- discussed to monitor sugars closely. - Ordered DEXCOM again and will continue samples for now, waiting on assistance program for these.  Continue to work with CCM. - Check blood sugar 4-5 times a day  and document for provider.   - Eye and foot exam up to date. - Statin and ARB on board. - Flu shot and Pneumonia vaccines up to date. Return in 3 months.      Relevant Orders   Bayer DCA Hb A1c Waived (Completed)   Microalbumin, Urine Waived (Completed)     Musculoskeletal and Integument   Osteopenia of neck of right femur    Chronic.  Noted on Dexa in 2020.  Repeat Dexa 2025.  Continue daily Vit D and calcium. Check levels today.        Other   B12 deficiency    Chronic, stable.  Recheck B12 level today and continue supplement.      Relevant Orders   Vitamin B12   Elevated LFTs    Recheck CMP today.  Recent had improved.      Relevant Orders   Comprehensive metabolic panel   Obesity    BMI 30.99.  Recommended eating smaller high protein, low fat meals more frequently and exercising 30 mins a day 5 times a week with a goal of 10-15lb weight loss in the next 3 months. Patient voiced their  understanding and motivation to adhere to these recommendations.       Vitamin D deficiency    Continue current supplement and plan for DEXA scan repeat in 2025 due to osteopenia..  Check Vit D today.      Relevant Orders   VITAMIN D 25 Hydroxy (Vit-D Deficiency, Fractures)   Other Visit Diagnoses     Medicare annual wellness visit, subsequent    -  Primary   Medicare wellness due and performed with patient today.   Colon cancer screening       GI referral placed.   Relevant Orders   Ambulatory referral to Gastroenterology        Follow up plan: Return in about 3 months (around 02/24/2023) for T2DM, HTN/HLD, OSTEOPENIA.   LABORATORY TESTING:  - Pap smear: not applicable  IMMUNIZATIONS:   - Tdap: Tetanus vaccination status reviewed: last tetanus booster within 10 years. - Influenza: Up to date - Pneumovax: Up to date - Prevnar: Up to date - COVID: Up to date - HPV: Not applicable - Shingrix vaccine: Up to date  SCREENING: -Mammogram: Up to date  - Colonoscopy: Ordered today  - Bone Density: Up to date  -Hearing Test: Not applicable  -Spirometry: Not applicable   PATIENT COUNSELING:   Advised to take 1 mg of folate supplement per day if capable of pregnancy.   Sexuality: Discussed sexually transmitted diseases, partner selection, use of condoms, avoidance of unintended pregnancy  and contraceptive alternatives.   Advised to avoid cigarette smoking.  I discussed with the patient that most people either abstain from alcohol or drink within safe limits (<=14/week and <=4 drinks/occasion for males, <=7/weeks and <= 3 drinks/occasion for females) and that the risk for alcohol disorders and other health effects rises proportionally with the number of drinks per week and how often a drinker exceeds daily limits.  Discussed cessation/primary prevention of drug use and availability of treatment for abuse.   Diet: Encouraged to adjust caloric intake to maintain  or achieve  ideal body weight, to reduce intake of dietary saturated fat and total fat, to limit sodium intake by avoiding high sodium foods and not adding table salt, and to maintain adequate dietary potassium and calcium preferably from fresh fruits, vegetables, and low-fat dairy products.    stressed the importance of regular exercise  Injury prevention: Discussed safety belts, safety helmets, smoke detector, smoking near bedding or upholstery.   Dental health: Discussed importance of regular tooth brushing, flossing, and dental visits.    NEXT PREVENTATIVE PHYSICAL DUE IN 1 YEAR. Return in about 3 months (around 02/24/2023) for T2DM, HTN/HLD, OSTEOPENIA.

## 2022-11-24 NOTE — Telephone Encounter (Signed)
-----   Message from Venita Lick, NP sent at 11/24/2022 12:29 PM EST ----- Regarding: RE: Dexcom Can you fax her last two office notes as instructed below. ----- Message ----- From: Wendy Poet Sent: 11/24/2022  12:12 PM EST To: Venita Lick, NP Subject: Dexcom                                         Good afternoon Jolene,  I spoke with the representative for Dexcom this afternoon to check on the status of the application we sent in. She stated that the application is still in processing and they they are needing her last two office visits faxed to them to proceed with completing the application. We are not able to fax office visits on our end but they can be faxed to [phone], attention Bill who is the representative working on her application.  Charlene Juarez

## 2022-11-25 ENCOUNTER — Encounter: Payer: Self-pay | Admitting: Nurse Practitioner

## 2022-11-25 ENCOUNTER — Telehealth: Payer: Self-pay | Admitting: Nurse Practitioner

## 2022-11-25 ENCOUNTER — Other Ambulatory Visit: Payer: Self-pay | Admitting: Nurse Practitioner

## 2022-11-25 DIAGNOSIS — E039 Hypothyroidism, unspecified: Secondary | ICD-10-CM

## 2022-11-25 DIAGNOSIS — E871 Hypo-osmolality and hyponatremia: Secondary | ICD-10-CM

## 2022-11-25 NOTE — Progress Notes (Signed)
Contacted via [place] - outpatient labs in 2 weeks please   Good afternoon Charlene Juarez, your labs have returned: - Kidney function, eGFR and creatinine, is stable.  Liver function shows mild elevation in ALT, but normal AST.  We will continue to monitor. - Sodium was a little low, add just a little tablet salt to diet daily and I would like to recheck on outpatient labs in 2 weeks. - TSH, thyroid lab, was also a little elevated this check -- meaning more sluggish.  I will recheck in 2 weeks. - Remainder of labs all stable with exception of B12 which is pending.  Any questions? Keep being awesome!!  Thank you for allowing me to participate in your care.  I appreciate you. Kindest regards, Suhayla Chisom

## 2022-11-25 NOTE — Progress Notes (Signed)
Scheduled patient lab appointment for 12/10/2022 @ 10 am.

## 2022-11-25 NOTE — Telephone Encounter (Signed)
Contacted Charlene Juarez to schedule their annual wellness visit. Appointment made for 12/27/2022.  Sherol Dade; Care Guide Ambulatory Clinical [place] [facility] [facility] Direct Dial: [phone]

## 2022-11-26 LAB — CBC WITH DIFFERENTIAL/PLATELET
Basophils Absolute: 0 10*3/uL (ref 0.0–0.2)
Basos: 1 %
EOS (ABSOLUTE): 0.1 10*3/uL (ref 0.0–0.4)
Eos: 2 %
Hematocrit: 42.4 % (ref 34.0–46.6)
Hemoglobin: 14.1 g/dL (ref 11.1–15.9)
Immature Grans (Abs): 0 10*3/uL (ref 0.0–0.1)
Immature Granulocytes: 0 %
Lymphocytes Absolute: 2.4 10*3/uL (ref 0.7–3.1)
Lymphs: 38 %
MCH: 29 pg (ref 26.6–33.0)
MCHC: 33.3 g/dL (ref 31.5–35.7)
MCV: 87 fL (ref 79–97)
Monocytes Absolute: 0.6 10*3/uL (ref 0.1–0.9)
Monocytes: 9 %
Neutrophils Absolute: 3.3 10*3/uL (ref 1.4–7.0)
Neutrophils: 50 %
Platelets: 208 10*3/uL (ref 150–450)
RBC: 4.86 x10E6/uL (ref 3.77–5.28)
RDW: 13.5 % (ref 11.7–15.4)
WBC: 6.4 10*3/uL (ref 3.4–10.8)

## 2022-11-26 LAB — MAGNESIUM: Magnesium: 2.1 mg/dL (ref 1.6–2.3)

## 2022-11-26 LAB — COMPREHENSIVE METABOLIC PANEL
ALT: 40 IU/L — ABNORMAL HIGH (ref 0–32)
AST: 23 IU/L (ref 0–40)
Albumin/Globulin Ratio: 2.3 — ABNORMAL HIGH (ref 1.2–2.2)
Albumin: 4.6 g/dL (ref 3.8–4.8)
Alkaline Phosphatase: 59 IU/L (ref 44–121)
BUN/Creatinine Ratio: 33 — ABNORMAL HIGH (ref 12–28)
BUN: 31 mg/dL — ABNORMAL HIGH (ref 8–27)
Bilirubin Total: 0.3 mg/dL (ref 0.0–1.2)
CO2: 23 mmol/L (ref 20–29)
Calcium: 9.6 mg/dL (ref 8.7–10.3)
Chloride: 94 mmol/L — ABNORMAL LOW (ref 96–106)
Creatinine, Ser: 0.95 mg/dL (ref 0.57–1.00)
Globulin, Total: 2 g/dL (ref 1.5–4.5)
Glucose: 131 mg/dL — ABNORMAL HIGH (ref 70–99)
Potassium: 4.2 mmol/L (ref 3.5–5.2)
Sodium: 133 mmol/L — ABNORMAL LOW (ref 134–144)
Total Protein: 6.6 g/dL (ref 6.0–8.5)
eGFR: 64 mL/min/{1.73_m2} (ref >59)

## 2022-11-26 LAB — LIPID PANEL W/O CHOL/HDL RATIO
Cholesterol, Total: 126 mg/dL (ref 100–199)
HDL: 53 mg/dL (ref >39)
LDL Chol Calc (NIH): 50 mg/dL (ref 0–99)
Triglycerides: 136 mg/dL (ref 0–149)
VLDL Cholesterol Cal: 23 mg/dL (ref 5–40)

## 2022-11-26 LAB — VITAMIN B12: Vitamin B-12: 722 pg/mL (ref 232–1245)

## 2022-11-26 LAB — TSH: TSH: 4.59 u[IU]/mL — ABNORMAL HIGH (ref 0.450–4.500)

## 2022-11-26 LAB — VITAMIN D 25 HYDROXY (VIT D DEFICIENCY, FRACTURES): Vit D, 25-Hydroxy: 51.7 ng/mL (ref 30.0–100.0)

## 2022-11-30 ENCOUNTER — Other Ambulatory Visit: Payer: Self-pay

## 2022-11-30 ENCOUNTER — Encounter: Payer: Self-pay | Admitting: Gastroenterology

## 2022-11-30 ENCOUNTER — Telehealth: Payer: Self-pay

## 2022-11-30 ENCOUNTER — Encounter: Payer: Self-pay | Admitting: Nurse Practitioner

## 2022-11-30 MED ORDER — NA SULFATE-K SULFATE-MG SULF 17.5-3.13-1.6 GM/177ML PO SOLN: 1.0000 | Freq: Once | ORAL | 0 refills | Status: AC

## 2022-11-30 NOTE — Telephone Encounter (Signed)
Left voice message to let patient know her rx for Suprep has been sent to [facility] in [place] this morning.  Thanks, [place], [place]

## 2022-12-01 ENCOUNTER — Telehealth: Payer: Self-pay

## 2022-12-01 NOTE — Telephone Encounter (Signed)
Patient left a voicemail wanting Sharyn Lull to call her back because she has some questions about her colonoscopy prep.

## 2022-12-01 NOTE — Telephone Encounter (Signed)
Returned patients call to help her find instructions in her mychart . She was able to see the instructions and we reviewed them again.  Thanks, [place], [place]

## 2022-12-01 NOTE — Telephone Encounter (Signed)
Returned patients phone call. She inquired what time she should start her prep.  I advised that she should start her prep at 5pm the evening before her procedure and 5 hours before procedure time.  She has not received her instructions in the mail yet.  I've asked her to call me back as she was on the roof of her house-so that I can tell her how to find her instructions in her mychart.  Thanks, [place], [place]

## 2022-12-01 NOTE — Progress Notes (Cosign Needed)
Care Management & Coordination Services Pharmacy Team  Reason for Encounter: Diabetes  Contacted patient to discuss diabetes disease state. Spoke with patient on 12/01/2022    Recent office visits:  11/24/22-Jolene T. Ned Card, NP (PCP) Seen for Medicare Wellness and follow up visit. Labs ordered. Ambulatory referral to Gastroenterology. Follow up in 3 months.  Recent consult visits:  None noted  Hospital visits:  None in previous 6 months  Medications: Outpatient Encounter Medications as of 12/01/2022  Medication Sig   albuterol (VENTOLIN HFA) 108 (90 Base) MCG/ACT inhaler TAKE 2 PUFFS BY MOUTH EVERY 6 HOURS AS NEEDED FOR WHEEZE OR SHORTNESS OF BREATH (Patient taking differently: Inhale 2 puffs into the lungs every 6 (six) hours as needed for wheezing or shortness of breath. TAKE 2 PUFFS BY MOUTH EVERY 6 HOURS AS NEEDED FOR WHEEZE OR SHORTNESS OF BREATH)   Ascorbic Acid (VITAMIN C) 1000 MG tablet Take 1,000 mg by mouth daily.   Cholecalciferol (VITAMIN D3) 1000 units CAPS Take 1,000 Units by mouth at bedtime.   Continuous Blood Gluc Receiver ([place]) DEVI For continuous blood sugar monitoring as injecting insulin 4 times a day and over age 27.  Dx E11.9, Z79.4 (Patient not taking: Reported on 11/24/2022)   Continuous Blood Gluc Receiver ([place]) DEVI For continuous blood sugar monitoring as injecting insulin 4 times a day and over age 2. Dx E11.9, Z79.4 (Patient not taking: Reported on 11/24/2022)   Continuous Blood Gluc Sensor (DEXCOM G7 SENSOR) MISC For continuous blood sugar monitoring as injecting insulin 4 times a day and over age 50. Dx E11.9, Z79.4 (Patient not taking: Reported on 11/24/2022)   gabapentin (NEURONTIN) 100 MG capsule Take 1 capsule (100 mg total) by mouth 3 (three) times daily. (Patient taking differently: Take 100 mg by mouth at bedtime.)   Ginkgo Biloba 40 MG TABS Take 1 tablet by mouth daily.   glucose blood (CONTOUR TEST) test strip USE TO TEST  BLOOD SUGAR ONCE A DAY == DX E11.69   insulin aspart (NOVOLOG FLEXPEN) 100 UNIT/ML FlexPen Inject 10-12 Units into the skin 3 (three) times daily with meals.   insulin degludec (TRESIBA) 100 UNIT/ML FlexTouch Pen Inject 32 Units into the skin daily.   Insulin Pen Needle 31G X 8 MM MISC Use one needle daily to inject insulin.   JARDIANCE 25 MG TABS tablet TAKE 1 TABLET (25 MG TOTAL) BY MOUTH DAILY.   Magnesium 500 MG TABS Take 500 mg by mouth at bedtime.   metFORMIN (GLUCOPHAGE) 1000 MG tablet TAKE 1 TABLET (1,000 MG TOTAL) BY MOUTH TWICE A DAY WITH FOOD   Omega-3 Fatty Acids (FISH OIL) 1200 MG CAPS Take 1,200 mg by mouth daily.   pantoprazole (PROTONIX) 20 MG tablet TAKE 1 TABLET BY MOUTH TWICE A DAY   polyethylene glycol (MIRALAX / GLYCOLAX) 17 g packet Take 17 g by mouth daily as needed for moderate constipation.   rosuvastatin (CRESTOR) 20 MG tablet TAKE 1 TABLET BY MOUTH EVERY DAY   SYMBICORT 160-4.5 MCG/ACT inhaler TAKE 2 PUFFS BY MOUTH TWICE A DAY (Patient taking differently: Inhale 2 puffs into the lungs 2 (two) times daily as needed (shortness of breath).)   telmisartan (MICARDIS) 80 MG tablet TAKE 1 TABLET BY MOUTH EVERY DAY   No facility-administered encounter medications on file as of 12/01/2022.    Recent Relevant Labs: Lab Results  Component Value Date/Time   HGBA1C 6.7 (H) 11/24/2022 08:41 AM   HGBA1C 9.3 (H) 08/26/2022 09:32 AM  MICROALBUR 10 11/24/2022 08:41 AM   MICROALBUR 10 11/23/2021 08:21 AM    Kidney Function Lab Results  Component Value Date/Time   CREATININE 0.95 11/24/2022 08:44 AM   CREATININE 0.67 08/26/2022 09:34 AM   GFRNONAA >60 05/24/2022 12:23 AM   GFRAA 95 05/08/2020 08:26 AM   Current antihyperglycemic regimen:  Novolog 100 units inject 10-12 untis 3x daily Tresiba 100 untis inject 32 units daily Jardiance 25 mg take one tablet daily Metformin 1000 mg take tablet twice daily    Patient verbally confirms she is taking the above medications as  directed. Yes  What diet changes have been made to improve diabetes control?    Could not answer due to patient being busy helping on a leak on her roof.  What recent interventions/DTPs have been made to improve glycemic control:  None noted  Have there been any recent hospitalizations or ED visits since last visit with PharmD? No  Patient denies hypoglycemic symptoms, including Pale, Sweaty, Shaky, Hungry, Nervous/irritable, and Vision changes  Patient denies hyperglycemic symptoms, including blurry vision, excessive thirst, fatigue, polyuria, and weakness  How often are you checking your blood sugar? twice daily  What are your blood sugars ranging?  Fasting: 12/01/22-106 Before meals: N/a After meals: 12/01/22-160 patient states she ate a fruit cake Bedtime: N/a  During the week, how often does your blood glucose drop below 70? Never  Are you checking your feet daily/regularly? Yes  Adherence Review: Is the patient currently on a STATIN medication? Yes Is the patient currently on ACE/ARB medication? Yes Does the patient have >5 day gap between last estimated fill dates? No  Star Rating Drugs:  Novolog 100 units Last filled:11/27/22 41 DS, 10/25/22 50 DS Tresiba 100 untis Last filled:11/23/22 30 DS, 10/22/22 30 DS Jardiance 25 mg Last filled:11/19/22 90 DS, 08/25/22 90 DS Metformin 1000 mg Last filled:11/19/22 90 DS, 08/29/22 90 DS Rosuvastatin 20 mg Last filled:11/25/22 90 DS, 08/29/22 90 DS Telmisartan 80 mg Last filled:11/19/22 90 DS, 08/30/23 90 DS   Care Gaps: Annual wellness visit in last year? Yes Last eye exam / retinopathy screening:11/29/21 Last diabetic foot exam:11/24/22   Corrie Mckusick, RMA

## 2022-12-01 NOTE — Telephone Encounter (Signed)
Patient returning your call.

## 2022-12-08 NOTE — Telephone Encounter (Signed)
Patient left vm about colonoscopy medication

## 2022-12-08 NOTE — Telephone Encounter (Signed)
Returned patients phone call.  Left voice message for her to call me back.  Thanks, [place], [place]

## 2022-12-09 ENCOUNTER — Ambulatory Visit: Payer: Medicare Other | Admitting: Certified Registered"

## 2022-12-09 ENCOUNTER — Ambulatory Visit
Admission: RE | Admit: 2022-12-09 | Discharge: 2022-12-09 | Disposition: A | Discharge status: Home / Self Care | Payer: Medicare Other | Attending: Gastroenterology | Admitting: Gastroenterology

## 2022-12-09 ENCOUNTER — Encounter: Payer: Self-pay | Admitting: Gastroenterology

## 2022-12-09 ENCOUNTER — Encounter
Admission: RE | Disposition: A | Discharge status: Home / Self Care | Payer: Self-pay | Source: Home / Self Care | Attending: Gastroenterology

## 2022-12-09 DIAGNOSIS — E119 Type 2 diabetes mellitus without complications: Secondary | ICD-10-CM | POA: Diagnosis not present

## 2022-12-09 DIAGNOSIS — G4730 Sleep apnea, unspecified: Secondary | ICD-10-CM | POA: Diagnosis not present

## 2022-12-09 DIAGNOSIS — I10 Essential (primary) hypertension: Secondary | ICD-10-CM | POA: Diagnosis not present

## 2022-12-09 DIAGNOSIS — Z1211 Encounter for screening for malignant neoplasm of colon: Secondary | ICD-10-CM | POA: Diagnosis not present

## 2022-12-09 DIAGNOSIS — K219 Gastro-esophageal reflux disease without esophagitis: Secondary | ICD-10-CM | POA: Diagnosis not present

## 2022-12-09 DIAGNOSIS — J45909 Unspecified asthma, uncomplicated: Secondary | ICD-10-CM | POA: Insufficient documentation

## 2022-12-09 DIAGNOSIS — Z794 Long term (current) use of insulin: Secondary | ICD-10-CM | POA: Insufficient documentation

## 2022-12-09 DIAGNOSIS — Z7984 Long term (current) use of oral hypoglycemic drugs: Secondary | ICD-10-CM | POA: Diagnosis not present

## 2022-12-09 DIAGNOSIS — E785 Hyperlipidemia, unspecified: Secondary | ICD-10-CM | POA: Insufficient documentation

## 2022-12-09 HISTORY — PX: COLONOSCOPY WITH PROPOFOL: SHX5780

## 2022-12-09 LAB — GLUCOSE, CAPILLARY: Glucose-Capillary: 103 mg/dL — ABNORMAL HIGH (ref 70–99)

## 2022-12-09 SURGERY — COLONOSCOPY WITH PROPOFOL
Anesthesia: General

## 2022-12-09 MED ORDER — PROPOFOL 10 MG/ML IV BOLUS
INTRAVENOUS | Status: DC | PRN
  Administered 2022-12-09: 70 mg via INTRAVENOUS

## 2022-12-09 MED ORDER — SODIUM CHLORIDE 0.9 % IV SOLN: INTRAVENOUS | Status: DC

## 2022-12-09 MED ORDER — LIDOCAINE HCL (CARDIAC) PF 100 MG/5ML IV SOSY
PREFILLED_SYRINGE | INTRAVENOUS | Status: DC | PRN
  Administered 2022-12-09: 50 mg via INTRAVENOUS

## 2022-12-09 MED ORDER — PROPOFOL 500 MG/50ML IV EMUL
INTRAVENOUS | Status: DC | PRN
  Administered 2022-12-09: 130 ug/kg/min via INTRAVENOUS

## 2022-12-09 NOTE — Transfer of Care (Signed)
Immediate Anesthesia Transfer of Care Note  Patient: Charlene Juarez  Procedure(s) Performed: COLONOSCOPY WITH PROPOFOL  Patient Location: Endoscopy Unit  Anesthesia Type:General  Level of Consciousness: drowsy  Airway & Oxygen Therapy: Patient Spontanous Breathing  Post-op Assessment: Report given to RN  Post vital signs: stable  Last Vitals:  Vitals Value Taken Time  BP    Temp    Pulse 76 12/09/22 1017  Resp    SpO2 97 % 12/09/22 1017  Vitals shown include unvalidated device data.  Last Pain:  Vitals:   12/09/22 0859  TempSrc: Temporal  PainSc:          Complications: No notable events documented.

## 2022-12-09 NOTE — Anesthesia Postprocedure Evaluation (Signed)
Anesthesia Post Note  Patient: Charlene Juarez  Procedure(s) Performed: COLONOSCOPY WITH PROPOFOL  Patient location during evaluation: Endoscopy Anesthesia Type: General Level of consciousness: awake and alert Pain management: pain level controlled Vital Signs Assessment: post-procedure vital signs reviewed and stable Respiratory status: spontaneous breathing, nonlabored ventilation, respiratory function stable and patient connected to nasal cannula oxygen Cardiovascular status: blood pressure returned to baseline and stable Postop Assessment: no apparent nausea or vomiting Anesthetic complications: no  No notable events documented.   Last Vitals:  Vitals:   12/09/22 1027 12/09/22 1037  BP: 112/71 123/74  Pulse: 82 75  Resp: 16 15  Temp:    SpO2: 100% 99%    Last Pain:  Vitals:   12/09/22 1037  TempSrc:   PainSc: 0-No pain                 Dimas Millin

## 2022-12-09 NOTE — Op Note (Signed)
[facility] Gastroenterology Patient Name: Charlene Juarez Procedure Date: 12/09/2022 9:52 AM MRN: [id number] Account #: [id number] Date of Birth: 1951-07-19 Admit Type: Outpatient Age: 72 Room: [facility] ENDO ROOM 3 Gender: Female Note Status: Finalized Instrument Name: Park Meo [id number] Procedure:             Colonoscopy Indications:           Screening for colorectal malignant neoplasm Providers:             Jonathon Bellows MD, MD Referring MD:          Barbaraann Faster. Ned Card (Referring MD) Medicines:             Monitored Anesthesia Care Complications:         No immediate complications. Procedure:             Pre-Anesthesia Assessment:                        - Prior to the procedure, a History and Physical was                         performed, and patient medications, allergies and                         sensitivities were reviewed. The patient's tolerance                         of previous anesthesia was reviewed.                        - The risks and benefits of the procedure and the                         sedation options and risks were discussed with the                         patient. All questions were answered and informed                         consent was obtained.                        - ASA Grade Assessment: II - A patient with mild                         systemic disease.                        After obtaining informed consent, the colonoscope was                         passed under direct vision. Throughout the procedure,                         the patient's blood pressure, pulse, and oxygen                         saturations were monitored continuously. The                         Colonoscope was introduced  through the anus and                         advanced to the the cecum, identified by the                         appendiceal orifice. The colonoscopy was performed                         with ease. The patient tolerated the procedure well.                          The quality of the bowel preparation was excellent.                         The ileocecal valve, appendiceal orifice, and rectum                         were photographed. Findings:      The perianal and digital rectal examinations were normal.      The entire examined colon appeared normal on direct and retroflexion       views. Impression:            - The entire examined colon is normal on direct and                         retroflexion views.                        - No specimens collected. Recommendation:        - Discharge patient to home (with escort).                        - Resume previous diet.                        - Continue present medications.                        - Repeat colonoscopy in 10 years for screening                         purposes. Procedure Code(s):     --- Professional ---                        [phone], Colonoscopy, flexible; diagnostic, including                         collection of specimen(s) by brushing or washing, when                         performed (separate procedure) Diagnosis Code(s):     --- Professional ---                        Z12.11, Encounter for screening for malignant neoplasm                         of colon CPT copyright 2022 American Medical Association. All rights reserved. The codes documented in this report  are preliminary and upon coder review may  be revised to meet current compliance requirements. Jonathon Bellows, MD Jonathon Bellows MD, MD 12/09/2022 10:17:03 AM This report has been signed electronically. Number of Addenda: 0 Note Initiated On: 12/09/2022 9:52 AM Scope Withdrawal Time: 0 hours 9 minutes 55 seconds  Total Procedure Duration: 0 hours 13 minutes 12 seconds  Estimated Blood Loss:  Estimated blood loss: none.      [facility]

## 2022-12-09 NOTE — H&P (Signed)
Jonathon Bellows, MD [address], [place], [place], [place], [address] [address], [place], [place], [place], [address] Phone: [phone]  Fax: [phone]  Primary Care Physician:  Venita Lick, NP   Pre-Procedure History & Physical: HPI:  Charlene Juarez is a 72 y.o. female is here for an colonoscopy.   Past Medical History:  Diagnosis Date   Arthritis    Bursitis    Cancer ([place])    SKIN   Diabetes mellitus without complication (HCC)    GERD (gastroesophageal reflux disease)    Hyperlipidemia    Hypertension    Sleep apnea     Past Surgical History:  Procedure Laterality Date   cancer removal off nose     CATARACT EXTRACTION W/PHACO Right 01/13/2017   Procedure: CATARACT EXTRACTION PHACO AND INTRAOCULAR LENS PLACEMENT ([place]);  Surgeon: Eulogio Bear, MD;  Location: [facility] ORS;  Service: Ophthalmology;  Laterality: Right;  [place] 31.1 AP% 0.0 CDE 1.78 Fluid Pack lot # [id number]   CATARACT EXTRACTION W/PHACO Left 02/24/2017   Procedure: CATARACT EXTRACTION PHACO AND INTRAOCULAR LENS PLACEMENT (IOC);  Surgeon: Eulogio Bear, MD;  Location: [facility] ORS;  Service: Ophthalmology;  Laterality: Left;  [place]  00:45.5 AP  0.7 CDE   2.76  fluid pack lot # [id number]  exp. 08-19-2018   DILATION AND CURETTAGE OF UTERUS     ESOPHAGOGASTRODUODENOSCOPY (EGD) WITH PROPOFOL N/A 01/06/2022   Procedure: ESOPHAGOGASTRODUODENOSCOPY (EGD) WITH PROPOFOL;  Surgeon: Jonathon Bellows, MD;  Location: [facility] ENDOSCOPY;  Service: Gastroenterology;  Laterality: N/A;   EYE SURGERY     rotator cuff surgery     TUBAL LIGATION      Prior to Admission medications   Medication Sig Start Date End Date Taking? Authorizing Provider  Ascorbic Acid (VITAMIN C) 1000 MG tablet Take 1,000 mg by mouth daily.   Yes [provider]  Cholecalciferol (VITAMIN D3) 1000 units CAPS Take 1,000 Units by mouth at bedtime.   Yes [provider]  gabapentin (NEURONTIN) 100 MG capsule Take 1  capsule (100 mg total) by mouth 3 (three) times daily. Patient taking differently: Take 100 mg by mouth at bedtime. 03/05/22  Yes Cannady, Jolene T, NP  Ginkgo Biloba 40 MG TABS Take 1 tablet by mouth daily.   Yes [provider]  insulin aspart (NOVOLOG FLEXPEN) 100 UNIT/ML FlexPen Inject 10-12 Units into the skin 3 (three) times daily with meals. 11/23/22  Yes Cannady, Jolene T, NP  insulin degludec (TRESIBA) 100 UNIT/ML FlexTouch Pen Inject 32 Units into the skin daily.   Yes [provider]  JARDIANCE 25 MG TABS tablet TAKE 1 TABLET (25 MG TOTAL) BY MOUTH DAILY. 11/19/22  Yes Cannady, Jolene T, NP  Magnesium 500 MG TABS Take 500 mg by mouth at bedtime.   Yes [provider]  metFORMIN (GLUCOPHAGE) 1000 MG tablet TAKE 1 TABLET (1,000 MG TOTAL) BY MOUTH TWICE A DAY WITH FOOD 11/19/22  Yes Cannady, Jolene T, NP  Omega-3 Fatty Acids (FISH OIL) 1200 MG CAPS Take 1,200 mg by mouth daily.   Yes [provider]  pantoprazole (PROTONIX) 20 MG tablet TAKE 1 TABLET BY MOUTH TWICE A DAY 10/25/22  Yes Jonathon Bellows, MD  rosuvastatin (CRESTOR) 20 MG tablet TAKE 1 TABLET BY MOUTH EVERY DAY 06/01/22  Yes Cannady, Jolene T, NP  telmisartan (MICARDIS) 80 MG tablet TAKE 1 TABLET BY MOUTH EVERY DAY 11/19/22  Yes Cannady, Jolene T, NP  albuterol (VENTOLIN HFA) 108 (90 Base)  MCG/ACT inhaler TAKE 2 PUFFS BY MOUTH EVERY 6 HOURS AS NEEDED FOR WHEEZE OR SHORTNESS OF BREATH Patient taking differently: Inhale 2 puffs into the lungs every 6 (six) hours as needed for wheezing or shortness of breath. TAKE 2 PUFFS BY MOUTH EVERY 6 HOURS AS NEEDED FOR WHEEZE OR SHORTNESS OF BREATH 01/18/22   Cannady, Henrine Screws T, NP  Continuous Blood Gluc Receiver ([place]) DEVI For continuous blood sugar monitoring as injecting insulin 4 times a day and over age 70.  Dx E11.9, Z79.4 Patient not taking: Reported on 11/24/2022 09/23/22   Marnee Guarneri T, NP  Continuous Blood Gluc Receiver ([place]) [place] For  continuous blood sugar monitoring as injecting insulin 4 times a day and over age 3. Dx E11.9, Z79.4 Patient not taking: Reported on 11/24/2022 09/28/22   Marnee Guarneri T, NP  Continuous Blood Gluc Sensor ([place]) MISC For continuous blood sugar monitoring as injecting insulin 4 times a day and over age 66. Dx E11.9, Z79.4 Patient not taking: Reported on 11/24/2022 09/28/22   Marnee Guarneri T, NP  glucose blood (CONTOUR TEST) test strip USE TO TEST BLOOD SUGAR ONCE A DAY == DX E11.69 03/09/22   Cannady, Henrine Screws T, NP  Insulin Pen Needle 31G X 8 MM MISC Use one needle daily to inject insulin. 08/26/22   Cannady, Henrine Screws T, NP  polyethylene glycol (MIRALAX / GLYCOLAX) 17 g packet Take 17 g by mouth daily as needed for moderate constipation. 05/24/22   Hall, Carole N, DO  SYMBICORT 160-4.5 MCG/ACT inhaler TAKE 2 PUFFS BY MOUTH TWICE A DAY Patient taking differently: Inhale 2 puffs into the lungs 2 (two) times daily as needed (shortness of breath). 11/19/20   Marnee Guarneri T, NP    Allergies as of 11/24/2022 - Review Complete 11/24/2022  Allergen Reaction Noted   Mounjaro [tirzepatide] Nausea And Vomiting 05/26/2022   Ozempic (0.25 or 0.5 mg-dose) [semaglutide(0.25 or 0.5mg -dos)] Nausea And Vomiting 05/26/2022   Codeine sulfate Diarrhea and Nausea And Vomiting 04/08/2015   Lisinopril Cough 04/08/2015    Family History  Problem Relation Age of Onset   Alcohol abuse Mother    Heart disease Father    Alcohol abuse Father    Diabetes Sister    Hyperlipidemia Sister    Diabetes Brother    Hypertension Brother    Thyroid disease Daughter    Diabetes Maternal Grandmother    Hypertension Sister    Aneurysm Sister    Diabetes Sister    Diabetes Sister    Breast cancer Maternal Aunt     Social History   Socioeconomic History   Marital status: Married    Spouse name: Not on file   Number of children: 4   Years of education: Not on file   Highest education level: Associate degree:  occupational, Hotel manager, or vocational program  Occupational History   Occupation: retired Charity fundraiser  Tobacco Use   Smoking status: Never   Smokeless tobacco: Never  Vaping Use   Vaping Use: Never used  Substance and Sexual Activity   Alcohol use: No    Alcohol/week: 0.0 standard drinks of alcohol   Drug use: Never   Sexual activity: Yes  Other Topics Concern   Not on file  Social History Narrative   Not on file   Social Determinants of Health   Financial Resource Strain: Medium Risk (11/24/2022)   Overall Financial Resource Strain (CARDIA)    Difficulty of Paying Living Expenses: Somewhat hard  Food Insecurity:  No Food Insecurity (11/24/2022)   Hunger Vital Sign    Worried About Running Out of Food in the Last Year: Never true    Ran Out of Food in the Last Year: Never true  Transportation Needs: No Transportation Needs (11/24/2022)   PRAPARE - Hydrologist (Medical): No    Lack of Transportation (Non-Medical): No  Physical Activity: Insufficiently Active (11/24/2022)   Exercise Vital Sign    Days of Exercise per Week: 2 days    Minutes of Exercise per Session: 20 min  Stress: No Stress Concern Present (11/24/2022)   [place]    Feeling of Stress : Not at all  Social Connections: Moderately Integrated (11/24/2022)   Social Connection and Isolation Panel [NHANES]    Frequency of Communication with Friends and Family: More than three times a week    Frequency of Social Gatherings with Friends and Family: More than three times a week    Attends Religious Services: More than 4 times per year    Active Member of [employer] or Organizations: No    Attends Archivist Meetings: Never    Marital Status: Married  Human resources officer Violence: Not At Risk (11/24/2022)   Humiliation, Afraid, Rape, and Kick questionnaire    Fear of Current or Ex-Partner: No    Emotionally Abused: No    Physically  Abused: No    Sexually Abused: No    Review of Systems: See HPI, otherwise negative ROS  Physical Exam: BP 136/61   Pulse 80   Temp (!) 97 F (36.1 C) (Temporal)   Resp 17   Ht 5\' 2"  (1.575 m)   Wt 74.5 kg   LMP 07/13/2000 (Approximate)   SpO2 98%   BMI 30.03 kg/m  General:   Alert,  pleasant and cooperative in NAD Head:  Normocephalic and atraumatic. Neck:  Supple; no masses or thyromegaly. Lungs:  Clear throughout to auscultation, normal respiratory effort.    Heart:  +S1, +S2, Regular rate and rhythm, No edema. Abdomen:  Soft, nontender and nondistended. Normal bowel sounds, without guarding, and without rebound.   Neurologic:  Alert and  oriented x4;  grossly normal neurologically.  Impression/Plan: Charlene Juarez is here for an colonoscopy to be performed for Screening colonoscopy average risk   Risks, benefits, limitations, and alternatives regarding  colonoscopy have been reviewed with the patient.  Questions have been answered.  All parties agreeable.   Jonathon Bellows, MD  12/09/2022, 9:55 AM

## 2022-12-09 NOTE — Anesthesia Preprocedure Evaluation (Signed)
Anesthesia Evaluation  Patient identified by MRN, date of birth, ID band Patient awake    Reviewed: Allergy & Precautions, NPO status , Patient's Chart, lab work & pertinent test results  History of Anesthesia Complications Negative for: history of anesthetic complications  Airway Mallampati: III  TM Distance: >3 FB Neck ROM: full    Dental  (+) Poor Dentition, Missing, Implants   Pulmonary asthma , sleep apnea    Pulmonary exam normal        Cardiovascular Exercise Tolerance: Good hypertension, (-) angina Normal cardiovascular exam     Neuro/Psych negative neurological ROS  negative psych ROS   GI/Hepatic Neg liver ROS,GERD  Controlled,,  Endo/Other  diabetes, Type 2    Renal/GU negative Renal ROS  negative genitourinary   Musculoskeletal   Abdominal   Peds  Hematology negative hematology ROS (+)   Anesthesia Other Findings Past Medical History: No date: Arthritis No date: Bursitis No date: Cancer ([place])     Comment:  SKIN No date: Diabetes mellitus without complication (HCC) No date: GERD (gastroesophageal reflux disease) No date: Hyperlipidemia No date: Hypertension No date: Sleep apnea  Past Surgical History: No date: cancer removal off nose 01/13/2017: CATARACT EXTRACTION W/PHACO; Right     Comment:  Procedure: CATARACT EXTRACTION PHACO AND INTRAOCULAR               LENS PLACEMENT (I[facility]);  Surgeon: Eulogio Bear, MD;                Location: [facility] ORS;  Service: Ophthalmology;  Laterality:              Right;  [place] 31.1 AP% 0.0 CDE 1.78 Fluid Pack lot #               [id number] 02/24/2017: CATARACT EXTRACTION W/PHACO; Left     Comment:  Procedure: CATARACT EXTRACTION PHACO AND INTRAOCULAR               LENS PLACEMENT ([facility]);  Surgeon: Eulogio Bear, MD;                Location: [facility] ORS;  Service: Ophthalmology;  Laterality:              Left;  [place]  00:45.5 AP  0.7 CDE   2.76  fluid pack lot                # [id number]  exp. 08-19-2018 No date: DILATION AND CURETTAGE OF UTERUS No date: EYE SURGERY No date: rotator cuff surgery No date: TUBAL LIGATION  BMI    Body Mass Index: 30.06 kg/m      Reproductive/Obstetrics negative OB ROS                              Anesthesia Physical Anesthesia Plan  ASA: 3  Anesthesia Plan: General   Post-op Pain Management:    Induction: Intravenous  PONV Risk Score and Plan: Propofol infusion and TIVA  Airway Management Planned: Natural Airway and Nasal Cannula  Additional Equipment:   Intra-op Plan:   Post-operative Plan:   Informed Consent: I have reviewed the patients History and Physical, chart, labs and discussed the procedure including the risks, benefits and alternatives for the proposed anesthesia with the patient or authorized representative who has indicated his/her understanding and acceptance.     Dental Advisory Given  Plan Discussed with: Anesthesiologist, CRNA and Surgeon  Anesthesia Plan Comments: (Patient consented  for risks of anesthesia including but not limited to:  - adverse reactions to medications - risk of airway placement if required - damage to eyes, teeth, lips or other oral mucosa - nerve damage due to positioning  - sore throat or hoarseness - Damage to heart, brain, nerves, lungs, other parts of body or loss of life  Patient voiced understanding.)         Anesthesia Quick Evaluation

## 2022-12-10 ENCOUNTER — Encounter: Payer: Self-pay | Admitting: Gastroenterology

## 2022-12-10 ENCOUNTER — Other Ambulatory Visit: Payer: Medicare Other

## 2022-12-10 DIAGNOSIS — E871 Hypo-osmolality and hyponatremia: Secondary | ICD-10-CM | POA: Diagnosis not present

## 2022-12-10 DIAGNOSIS — E039 Hypothyroidism, unspecified: Secondary | ICD-10-CM | POA: Diagnosis not present

## 2022-12-11 LAB — T4, FREE: Free T4: 1.11 ng/dL (ref 0.82–1.77)

## 2022-12-11 LAB — TSH: TSH: 1.69 u[IU]/mL (ref 0.450–4.500)

## 2022-12-11 LAB — THYROID PEROXIDASE ANTIBODY: Thyroperoxidase Ab SerPl-aCnc: <9 IU/mL (ref 0–34)

## 2022-12-11 LAB — SODIUM: Sodium: 140 mmol/L (ref 134–144)

## 2022-12-11 NOTE — Progress Notes (Signed)
Contacted via [place] afternoon [place], your labs have returned and are nice and normal this check.  No changes needed.  Great news!!

## 2022-12-15 NOTE — Patient Outreach (Signed)
  Care Management   Follow Up Note   12/20/2022 Name: Charlene Juarez MRN: [id number] DOB: 06-19-1951   Referred by: Venita Lick, NP Reason for referral : No chief complaint on file.   An unsuccessful telephone outreach was attempted today. The patient was referred to the case management team for assistance with care management and care coordination.   Follow Up Plan: The patient has been provided with contact information for the care management team and has been advised to call with any health related questions or concerns.   [place] Constable, Pharm.D. - [phone]

## 2022-12-18 ENCOUNTER — Other Ambulatory Visit: Payer: Self-pay | Admitting: Nurse Practitioner

## 2022-12-20 ENCOUNTER — Ambulatory Visit: Payer: Medicare Other

## 2022-12-20 NOTE — Telephone Encounter (Signed)
Requested Prescriptions  Refused Prescriptions Disp Refills   JARDIANCE 25 MG TABS tablet [Pharmacy Med Name: JARDIANCE 25 MG TABLET] 90 tablet 0    Sig: TAKE 1 TABLET (25 MG TOTAL) BY MOUTH DAILY.     Endocrinology:  Diabetes - SGLT2 Inhibitors Passed - 12/18/2022  9:33 AM      Passed - Cr in normal range and within 360 days    Creatinine, Ser  Date Value Ref Range Status  11/24/2022 0.95 0.57 - 1.00 mg/dL Final         Passed - HBA1C is between 0 and 7.9 and within 180 days    HB A1C (BAYER DCA - WAIVED)  Date Value Ref Range Status  11/24/2022 6.7 (H) 4.8 - 5.6 % Final    Comment:             Prediabetes: 5.7 - 6.4          Diabetes: >6.4          Glycemic control for adults with diabetes: <7.0          Passed - eGFR in normal range and within 360 days    GFR calc Af Amer  Date Value Ref Range Status  05/08/2020 95 >59 mL/min/1.73 Final    Comment:    **[facility] currently reports eGFR in compliance with the current**   recommendations of the [employer]. [facility] will   update reporting as new guidelines are published from the NKF-ASN   Task force.    GFR, Estimated  Date Value Ref Range Status  05/24/2022 >60 >60 mL/min Final    Comment:    (NOTE) Calculated using the CKD-EPI Creatinine Equation (2021)    eGFR  Date Value Ref Range Status  11/24/2022 64 >59 mL/min/1.73 Final         Passed - Valid encounter within last 6 months    Recent Outpatient Visits           3 weeks ago Medicare annual wellness visit, subsequent   [place], [place] T, NP   2 months ago Insulin dependent type 2 diabetes mellitus ([place])   [place], [place] T, NP   3 months ago Type 2 diabetes mellitus with obesity ([place])   [place], [place] T, NP   4 months ago Type 2 diabetes mellitus with obesity ([place])   [place], [place] T, NP   6  months ago Type 2 diabetes mellitus with obesity ([place])   [place], Barbaraann Faster, NP       Future Appointments             In 2 months [place], Barbaraann Faster, NP [place]

## 2022-12-27 ENCOUNTER — Telehealth: Payer: Self-pay

## 2022-12-27 ENCOUNTER — Ambulatory Visit: Payer: Medicare Other

## 2022-12-27 VITALS — Ht 62.0 in | Wt 169.0 lb

## 2022-12-27 DIAGNOSIS — Z0000 Encounter for general adult medical examination without abnormal findings: Secondary | ICD-10-CM | POA: Diagnosis not present

## 2022-12-27 NOTE — Telephone Encounter (Signed)
Patient returning call back to reschedule appt from 04/01. Please callback

## 2022-12-27 NOTE — Progress Notes (Cosign Needed)
Attempted to reschedule patient no show appointment on 12/20/22.  Rance Muir, RMA

## 2022-12-27 NOTE — Patient Instructions (Signed)
Charlene Juarez , Thank you for taking time to come for your Medicare Wellness Visit. I appreciate your ongoing commitment to your health goals. Please review the following plan we discussed and let me know if I can assist you in the future.   These are the goals we discussed:  Goals      CCM Expected Outcome:  Monitor, Self-Manage and Reduce Symptoms of Diabetes     Current Barriers:  Knowledge Deficits related to diabetes management education and benefits of controlled DM  Care Coordination needs related to denial of Dexcom and reordering of Dexcom meter, may need pharm D support in the future in a patient with DM  Chronic Disease Management support and education needs related to effective management of DM Lab Results  Component Value Date   HGBA1C 9.3 (H) 08/26/2022    Planned Interventions: Provided education to patient about basic DM disease process. The patient is concerned about her DM. She is hopeful to get the dexcom sensors approved for use and is waiting to hear back on this. She says it holds her more accountable because she can see what her blood sugars are doing on a consistent basis. Education and support given. ; Reviewed medications with patient and discussed importance of medication adherence. The patient has been frustrated with all of the insurance changes and what the insurance will and will not pay for. The patient wants to ask the pcp about going back on Mounjaro at a lower dose to see if she can tolerate it. She was having issues when she was at 7.5mg . Will collaborate with the pcp concerning the patient request. The patient has an upcoming appointment with the pcp on 11-24-2022 and encouraged the patient to discuss with the pcp this.  She feels like she can control her blood sugars better with having Mounjaro on board.         Reviewed prescribed diet with patient heart healthy/ADA diet. Discussed making sure she eats a light snack at bedtime to help with regulation of sugars  during sleep times. She is eating snacks at bedtime and this is helping keep her blood sugars stable during the night; Counseled on importance of regular laboratory monitoring as prescribed. The patient will have new blood work coming up. The patient states that she is hopeful that her A1C will be down;        Discussed plans with patient for ongoing care management follow up and provided patient with direct contact information for care management team;      Provided patient with written educational materials related to hypo and hyperglycemia and importance of correct treatment. With the Dexcom meter she has had some lows in the less than 50 and highest she has seen is 229. Currently she is doing finger sticks and her range in the morning is 80-90 and afternoon 160 to a little over 200. The patient knows how to effectively manage her hypo and hyperglycemia. Recommendations provided.     Reviewed scheduled/upcoming provider appointments including: 11-24-2022, saw pcp yesterday;         Advised patient, providing education and rationale, to check cbg when you have symptoms of low or high blood sugar and has a continuous glucose reader at this time  and record. Currently is doing finger sticks. The patient states she hopes the insurance will approve for her to keep getting the Dexcom sensors. She is still waiting on approval by insurance for the [facility] sensors. A company in [facility] is working with her  on it.     call provider for findings outside established parameters;       Review of patient status, including review of consultants reports, relevant laboratory and other test results, and medications completed;       Advised patient to discuss changes in DM health and well being with provider;      Screening for signs and symptoms of depression related to chronic disease state;        Assessed social determinant of health barriers;    The patient is having some tinnitus in her ears that she has noticed more  recently. She has a sound machine and that helps a lot most of the time but not all of the time. Will send information to the patient about the Epley maneuver to see if this possibly may help with her tinnitus she is experiencing. She states this has been going on for a while. Denies any safety concerns      Symptom Management: Take medications as prescribed   Attend all scheduled provider appointments Call provider office for new concerns or questions  call the Suicide and Crisis Lifeline: 988 call the [place] National Suicide Prevention Lifeline: 8258149048 or TTY: 870-872-3951 TTY 508 048 5799) to talk to a trained counselor call 1-800-273-TALK (toll free, 24 hour hotline) if experiencing a Mental Health or Behavioral Health Crisis  keep appointment with eye doctor check feet daily for cuts, sores or redness trim toenails straight across manage portion size wash and dry feet carefully every day wear comfortable, cotton socks wear comfortable, well-fitting shoes  Follow Up Plan: Telephone follow up appointment with care management team member scheduled for: 01-14-2023 at 1030 am       CCM Expected Outcome:  Monitor, Self-Manage and Reduce Symptoms of: HLD     Current Barriers:  Chronic Disease Management support and education needs related to for effective management of HLD  Planned Interventions: Provider established cholesterol goals reviewed; Counseled on importance of regular laboratory monitoring as prescribed; Provided HLD educational materials; Reviewed role and benefits of statin for ASCVD risk reduction; Discussed strategies to manage statin-induced myalgias; Reviewed importance of limiting foods high in cholesterol; Reviewed exercise goals and target of 150 minutes per week; Screening for signs and symptoms of depression related to chronic disease state;  Assessed social determinant of health barriers;   Symptom Management: Take medications as prescribed   Attend all  scheduled provider appointments Call provider office for new concerns or questions  call the Suicide and Crisis Lifeline: 988 call the [place] National Suicide Prevention Lifeline: 339-388-3573 or TTY: 386-352-9377 TTY (458)853-4714) to talk to a trained counselor call 1-800-273-TALK (toll free, 24 hour hotline) if experiencing a Mental Health or Behavioral Health Crisis  - call for medicine refill 2 or 3 days before it runs out - take all medications exactly as prescribed - call doctor with any symptoms you believe are related to your medicine - call doctor when you experience any new symptoms - go to all doctor appointments as scheduled - adhere to prescribed diet: heart healthy/ADA diet  - develop an exercise routine  Follow Up Plan: Telephone follow up appointment with care management team member scheduled for: 11-12-2022 at 1030 am       CCM Expected Outcome:  Monitor, Self-Manage, and Reduce Symptoms of Hypertension     Current Barriers:  Knowledge Deficits related to the benefits for taking blood pressures on a regular basis and recording Chronic Disease Management support and education needs related to effective management  of HTN BP Readings from Last 3 Encounters:  09/23/22 133/76  08/26/22 133/71  07/29/22 133/65     Planned Interventions: Evaluation of current treatment plan related to hypertension self management and patient's adherence to plan as established by provider. The patient is doing well with management of her blood pressures.  Denies any acute changes in her HTN or heart health;   Provided education to patient re: stroke prevention, s/s of heart attack and stroke. Education and support; Reviewed prescribed diet heart healthy/ADA diet. The patient is mindful of her dietary intake. The patient watches what she eats. She is compliant with dietary restrictions.   Reviewed medications with patient and discussed importance of compliance. The patient is compliant with  medications. Working with the pharm D for assistance with medications.;  Counseled on the importance of exercise goals with target of 150 minutes per week. The patient is active and loves to garden and do arrangements with flowers Discussed plans with patient for ongoing care management follow up and provided patient with direct contact information for care management team; Advised patient, providing education and rationale, to monitor blood pressure daily and record, calling PCP for findings outside established parameters. The patient normally does not take blood pressures at home. Has a cuff. Education and support given on the benefits of checking blood pressures on a regular basis;  Reviewed scheduled/upcoming provider appointments including: 11-24-2022 at 0900 am with the pcp Advised patient to discuss changes in her blood pressures and heart health with provider, reminder provided today; Provided education on prescribed diet heart healthy/ADA diet. Talked about eating smaller more frequent meals to keep her chronic conditions stable ;  Discussed complications of poorly controlled blood pressure such as heart disease, stroke, circulatory complications, vision complications, kidney impairment, sexual dysfunction;  Screening for signs and symptoms of depression related to chronic disease state;  Assessed social determinant of health barriers;   Symptom Management: Take medications as prescribed   Attend all scheduled provider appointments Call provider office for new concerns or questions  call the Suicide and Crisis Lifeline: 988 call the [place] National Suicide Prevention Lifeline: 316-875-82441-7824786600 or TTY: 705-377-64141-800-799-4 TTY 267-648-8326(1-(562)312-3599) to talk to a trained counselor call 1-800-273-TALK (toll free, 24 hour hotline) if experiencing a Mental Health or Behavioral Health Crisis  check blood pressure weekly learn about high blood pressure call doctor for signs and symptoms of high blood  pressure develop an action plan for high blood pressure keep all doctor appointments take medications for blood pressure exactly as prescribed begin an exercise program report new symptoms to your doctor  Follow Up Plan: Telephone follow up appointment with care management team member scheduled for: 01-14-2023 at 1030 am       DIET - EAT MORE FRUITS AND VEGETABLES     DIET - INCREASE WATER INTAKE     Recommend drinking at least 6-8 glasses of water a day      DIET - REDUCE SUGAR INTAKE     Continue to cut out all sweets and desserts in diet to help aid in weight loss.      Patient Stated     11/14/2020, wants to weigh 145 pounds     Patient Stated     Continue current lifestyle          This is a list of the screening recommended for you and due dates:  Health Maintenance  Topic Date Due   Eye exam for diabetics  12/31/2022   Flu Shot  04/21/2023  Hemoglobin A1C  05/27/2023   Mammogram  08/06/2023   Yearly kidney function blood test for diabetes  11/24/2023   Yearly kidney health urinalysis for diabetes  11/24/2023   Complete foot exam   11/24/2023   Medicare Annual Wellness Visit  12/27/2023   DEXA scan (bone density measurement)  05/22/2024   DTaP/Tdap/Td vaccine (3 - Td or Tdap) 01/28/2030   Colon Cancer Screening  12/08/2032   Pneumonia Vaccine  Completed   Hepatitis C Screening: USPSTF Recommendation to screen - Ages 74-79 yo.  Completed   Zoster (Shingles) Vaccine  Completed   HPV Vaccine  Aged Out   COVID-19 Vaccine  Discontinued    Advanced directives: no  Conditions/risks identified: none  Next appointment: Follow up in one year for your annual wellness visit 01/02/24 @ 10:15 am by phone   Preventive Care 65 Years and Older, Female Preventive care refers to lifestyle choices and visits with your health care provider that can promote health and wellness. What does preventive care include? A yearly physical exam. This is also called an annual well  check. Dental exams once or twice a year. Routine eye exams. Ask your health care provider how often you should have your eyes checked. Personal lifestyle choices, including: Daily care of your teeth and gums. Regular physical activity. Eating a healthy diet. Avoiding tobacco and drug use. Limiting alcohol use. Practicing safe sex. Taking low-dose aspirin every day. Taking vitamin and mineral supplements as recommended by your health care provider. What happens during an annual well check? The services and screenings done by your health care provider during your annual well check will depend on your age, overall health, lifestyle risk factors, and family history of disease. Counseling  Your health care provider may ask you questions about your: Alcohol use. Tobacco use. Drug use. Emotional well-being. Home and relationship well-being. Sexual activity. Eating habits. History of falls. Memory and ability to understand (cognition). Work and work Astronomer. Reproductive health. Screening  You may have the following tests or measurements: Height, weight, and BMI. Blood pressure. Lipid and cholesterol levels. These may be checked every 5 years, or more frequently if you are over 24 years old. Skin check. Lung cancer screening. You may have this screening every year starting at age 35 if you have a 30-pack-year history of smoking and currently smoke or have quit within the past 15 years. Fecal occult blood test (FOBT) of the stool. You may have this test every year starting at age 31. Flexible sigmoidoscopy or colonoscopy. You may have a sigmoidoscopy every 5 years or a colonoscopy every 10 years starting at age 33. Hepatitis C blood test. Hepatitis B blood test. Sexually transmitted disease (STD) testing. Diabetes screening. This is done by checking your blood sugar (glucose) after you have not eaten for a while (fasting). You may have this done every 1-3 years. Bone density scan.  This is done to screen for osteoporosis. You may have this done starting at age 38. Mammogram. This may be done every 1-2 years. Talk to your health care provider about how often you should have regular mammograms. Talk with your health care provider about your test results, treatment options, and if necessary, the need for more tests. Vaccines  Your health care provider may recommend certain vaccines, such as: Influenza vaccine. This is recommended every year. Tetanus, diphtheria, and acellular pertussis (Tdap, Td) vaccine. You may need a Td booster every 10 years. Zoster vaccine. You may need this after age 14. Pneumococcal 13-valent  conjugate (PCV13) vaccine. One dose is recommended after age 37. Pneumococcal polysaccharide (PPSV23) vaccine. One dose is recommended after age 40. Talk to your health care provider about which screenings and vaccines you need and how often you need them. This information is not intended to replace advice given to you by your health care provider. Make sure you discuss any questions you have with your health care provider. Document Released: 10/03/2015 Document Revised: 05/26/2016 Document Reviewed: 07/08/2015 Elsevier Interactive Patient Education  2017 [employer].  Fall Prevention in the Home Falls can cause injuries. They can happen to people of all ages. There are many things you can do to make your home safe and to help prevent falls. What can I do on the outside of my home? Regularly fix the edges of walkways and driveways and fix any cracks. Remove anything that might make you trip as you walk through a door, such as a raised step or threshold. Trim any bushes or trees on the path to your home. Use bright outdoor lighting. Clear any walking paths of anything that might make someone trip, such as rocks or tools. Regularly check to see if handrails are loose or broken. Make sure that both sides of any steps have handrails. Any raised decks and porches  should have guardrails on the edges. Have any leaves, snow, or ice cleared regularly. Use sand or salt on walking paths during winter. Clean up any spills in your garage right away. This includes oil or grease spills. What can I do in the bathroom? Use night lights. Install grab bars by the toilet and in the tub and shower. Do not use towel bars as grab bars. Use non-skid mats or decals in the tub or shower. If you need to sit down in the shower, use a plastic, non-slip stool. Keep the floor dry. Clean up any water that spills on the floor as soon as it happens. Remove soap buildup in the tub or shower regularly. Attach bath mats securely with double-sided non-slip rug tape. Do not have throw rugs and other things on the floor that can make you trip. What can I do in the bedroom? Use night lights. Make sure that you have a light by your bed that is easy to reach. Do not use any sheets or blankets that are too big for your bed. They should not hang down onto the floor. Have a firm chair that has side arms. You can use this for support while you get dressed. Do not have throw rugs and other things on the floor that can make you trip. What can I do in the kitchen? Clean up any spills right away. Avoid walking on wet floors. Keep items that you use a lot in easy-to-reach places. If you need to reach something above you, use a strong step stool that has a grab bar. Keep electrical cords out of the way. Do not use floor polish or wax that makes floors slippery. If you must use wax, use non-skid floor wax. Do not have throw rugs and other things on the floor that can make you trip. What can I do with my stairs? Do not leave any items on the stairs. Make sure that there are handrails on both sides of the stairs and use them. Fix handrails that are broken or loose. Make sure that handrails are as long as the stairways. Check any carpeting to make sure that it is firmly attached to the stairs.  Fix any carpet that  is loose or worn. Avoid having throw rugs at the top or bottom of the stairs. If you do have throw rugs, attach them to the floor with carpet tape. Make sure that you have a light switch at the top of the stairs and the bottom of the stairs. If you do not have them, ask someone to add them for you. What else can I do to help prevent falls? Wear shoes that: Do not have high heels. Have rubber bottoms. Are comfortable and fit you well. Are closed at the toe. Do not wear sandals. If you use a stepladder: Make sure that it is fully opened. Do not climb a closed stepladder. Make sure that both sides of the stepladder are locked into place. Ask someone to hold it for you, if possible. Clearly mark and make sure that you can see: Any grab bars or handrails. First and last steps. Where the edge of each step is. Use tools that help you move around (mobility aids) if they are needed. These include: Canes. Walkers. Scooters. Crutches. Turn on the lights when you go into a dark area. Replace any light bulbs as soon as they burn out. Set up your furniture so you have a clear path. Avoid moving your furniture around. If any of your floors are uneven, fix them. If there are any pets around you, be aware of where they are. Review your medicines with your doctor. Some medicines can make you feel dizzy. This can increase your chance of falling. Ask your doctor what other things that you can do to help prevent falls. This information is not intended to replace advice given to you by your health care provider. Make sure you discuss any questions you have with your health care provider. Document Released: 07/03/2009 Document Revised: 02/12/2016 Document Reviewed: 10/11/2014 Elsevier Interactive Patient Education  2017 [employer].

## 2022-12-27 NOTE — Progress Notes (Signed)
I connected with  Charlene Quillatricia Marlene Gambrell on 12/27/22 by a audio enabled telemedicine application and verified that I am speaking with the correct person using two identifiers.  Patient Location: Home  Provider Location: Office/Clinic  I discussed the limitations of evaluation and management by telemedicine. The patient expressed understanding and agreed to proceed.  Subjective:   Charlene Juarez is a 72 y.o. female who presents for Medicare Annual (Subsequent) preventive examination.  Review of Systems     Cardiac Risk Factors include: advanced age (>8855men, 66>65 women);diabetes mellitus;hypertension     Objective:    There were no vitals filed for this visit. There is no height or weight on file to calculate BMI.     12/27/2022   10:35 AM 12/09/2022    8:51 AM 05/23/2022    6:14 AM 05/22/2022   10:12 PM 01/06/2022   10:06 AM 11/17/2021   12:28 PM 11/17/2021   12:08 PM  Advanced Directives  Does Patient Have a Medical Advance Directive? No No No No No No No  Would patient like information on creating a medical advance directive? No - Patient declined  No - Patient declined No - Patient declined  No - Patient declined No - Patient declined    Current Medications (verified) Outpatient Encounter Medications as of 12/27/2022  Medication Sig   albuterol (VENTOLIN HFA) 108 (90 Base) MCG/ACT inhaler TAKE 2 PUFFS BY MOUTH EVERY 6 HOURS AS NEEDED FOR WHEEZE OR SHORTNESS OF BREATH (Patient taking differently: Inhale 2 puffs into the lungs every 6 (six) hours as needed for wheezing or shortness of breath. TAKE 2 PUFFS BY MOUTH EVERY 6 HOURS AS NEEDED FOR WHEEZE OR SHORTNESS OF BREATH)   Ascorbic Acid (VITAMIN C) 1000 MG tablet Take 1,000 mg by mouth daily.   Cholecalciferol (VITAMIN D3) 1000 units CAPS Take 1,000 Units by mouth at bedtime.   Continuous Blood Gluc Receiver (DEXCOM G7 RECEIVER) DEVI For continuous blood sugar monitoring as injecting insulin 4 times a day and over age 71.   Dx E11.9, Z79.4   Continuous Blood Gluc Receiver (DEXCOM G7 RECEIVER) DEVI For continuous blood sugar monitoring as injecting insulin 4 times a day and over age 71. Dx E11.9, Z79.4   Continuous Blood Gluc Sensor (DEXCOM G7 SENSOR) MISC For continuous blood sugar monitoring as injecting insulin 4 times a day and over age 71. Dx E11.9, Z79.4   gabapentin (NEURONTIN) 100 MG capsule Take 1 capsule (100 mg total) by mouth 3 (three) times daily. (Patient taking differently: Take 100 mg by mouth at bedtime.)   Ginkgo Biloba 40 MG TABS Take 1 tablet by mouth daily.   glucose blood (CONTOUR TEST) test strip USE TO TEST BLOOD SUGAR ONCE A DAY == DX E11.69   insulin aspart (NOVOLOG FLEXPEN) 100 UNIT/ML FlexPen Inject 10-12 Units into the skin 3 (three) times daily with meals.   insulin degludec (TRESIBA) 100 UNIT/ML FlexTouch Pen Inject 32 Units into the skin daily.   Insulin Pen Needle 31G X 8 MM MISC Use one needle daily to inject insulin.   JARDIANCE 25 MG TABS tablet TAKE 1 TABLET (25 MG TOTAL) BY MOUTH DAILY.   Magnesium 500 MG TABS Take 500 mg by mouth at bedtime.   metFORMIN (GLUCOPHAGE) 1000 MG tablet TAKE 1 TABLET (1,000 MG TOTAL) BY MOUTH TWICE A DAY WITH FOOD   Omega-3 Fatty Acids (FISH OIL) 1200 MG CAPS Take 1,200 mg by mouth daily.   pantoprazole (PROTONIX) 20 MG tablet TAKE 1 TABLET BY  MOUTH TWICE A DAY   polyethylene glycol (MIRALAX / GLYCOLAX) 17 g packet Take 17 g by mouth daily as needed for moderate constipation.   rosuvastatin (CRESTOR) 20 MG tablet TAKE 1 TABLET BY MOUTH EVERY DAY   SYMBICORT 160-4.5 MCG/ACT inhaler TAKE 2 PUFFS BY MOUTH TWICE A DAY (Patient taking differently: Inhale 2 puffs into the lungs 2 (two) times daily as needed (shortness of breath).)   telmisartan (MICARDIS) 80 MG tablet TAKE 1 TABLET BY MOUTH EVERY DAY   No facility-administered encounter medications on file as of 12/27/2022.    Allergies (verified) Mounjaro [tirzepatide], Ozempic (0.25 or 0.5 mg-dose)  [semaglutide(0.25 or 0.5mg -dos)], Codeine sulfate, and Lisinopril   History: Past Medical History:  Diagnosis Date   Arthritis    Bursitis    Cancer    SKIN   Diabetes mellitus without complication    GERD (gastroesophageal reflux disease)    Hyperlipidemia    Hypertension    Sleep apnea    Past Surgical History:  Procedure Laterality Date   cancer removal off nose     CATARACT EXTRACTION W/PHACO Right 01/13/2017   Procedure: CATARACT EXTRACTION PHACO AND INTRAOCULAR LENS PLACEMENT (IOC);  Surgeon: Nevada Crane, MD;  Location: [facility] ORS;  Service: Ophthalmology;  Laterality: Right;  [place] 31.1 AP% 0.0 CDE 1.78 Fluid Pack lot # [id number]   CATARACT EXTRACTION W/PHACO Left 02/24/2017   Procedure: CATARACT EXTRACTION PHACO AND INTRAOCULAR LENS PLACEMENT (IOC);  Surgeon: Nevada Crane, MD;  Location: [facility] ORS;  Service: Ophthalmology;  Laterality: Left;  [place]  00:45.5 AP  0.7 CDE   2.76  fluid pack lot # [id number]  exp. 08-19-2018   COLONOSCOPY WITH PROPOFOL N/A 12/09/2022   Procedure: COLONOSCOPY WITH PROPOFOL;  Surgeon: Wyline Mood, MD;  Location: [facility] ENDOSCOPY;  Service: Gastroenterology;  Laterality: N/A;   DILATION AND CURETTAGE OF UTERUS     ESOPHAGOGASTRODUODENOSCOPY (EGD) WITH PROPOFOL N/A 01/06/2022   Procedure: ESOPHAGOGASTRODUODENOSCOPY (EGD) WITH PROPOFOL;  Surgeon: Wyline Mood, MD;  Location: [facility] ENDOSCOPY;  Service: Gastroenterology;  Laterality: N/A;   EYE SURGERY     rotator cuff surgery     TUBAL LIGATION     Family History  Problem Relation Age of Onset   Alcohol abuse Mother    Heart disease Father    Alcohol abuse Father    Diabetes Sister    Hyperlipidemia Sister    Diabetes Brother    Hypertension Brother    Thyroid disease Daughter    Diabetes Maternal Grandmother    Hypertension Sister    Aneurysm Sister    Diabetes Sister    Diabetes Sister    Breast cancer Maternal Aunt    Social History   Socioeconomic History   Marital status: Married     Spouse name: Not on file   Number of children: 4   Years of education: Not on file   Highest education level: Associate degree: occupational, Scientist, product/process development, or vocational program  Occupational History   Occupation: retired Product manager  Tobacco Use   Smoking status: Never   Smokeless tobacco: Never  Vaping Use   Vaping Use: Never used  Substance and Sexual Activity   Alcohol use: No    Alcohol/week: 0.0 standard drinks of alcohol   Drug use: Never   Sexual activity: Yes  Other Topics Concern   Not on file  Social History Narrative   Not on file   Social Determinants of Health   Financial Resource Strain: Low Risk  (12/27/2022)   Overall  Financial Resource Strain (CARDIA)    Difficulty of Paying Living Expenses: Not hard at all  Recent Concern: Financial Resource Strain - Medium Risk (11/24/2022)   Overall Financial Resource Strain (CARDIA)    Difficulty of Paying Living Expenses: Somewhat hard  Food Insecurity: No Food Insecurity (12/27/2022)   Hunger Vital Sign    Worried About Running Out of Food in the Last Year: Never true    Ran Out of Food in the Last Year: Never true  Transportation Needs: No Transportation Needs (12/27/2022)   PRAPARE - Administrator, Civil Service (Medical): No    Lack of Transportation (Non-Medical): No  Physical Activity: Insufficiently Active (12/27/2022)   Exercise Vital Sign    Days of Exercise per Week: 6 days    Minutes of Exercise per Session: 20 min  Stress: No Stress Concern Present (12/27/2022)   [employer] of Occupational Health - Occupational Stress Questionnaire    Feeling of Stress : Not at all  Social Connections: Moderately Isolated (12/27/2022)   Social Connection and Isolation Panel [NHANES]    Frequency of Communication with Friends and Family: More than three times a week    Frequency of Social Gatherings with Friends and Family: More than three times a week    Attends Religious Services: Never    Database administrator  or Organizations: No    Attends Engineer, structural: Never    Marital Status: Married    Tobacco Counseling Counseling given: Not Answered   Clinical Intake:  Pre-visit preparation completed: Yes  Pain : No/denies pain     Nutritional Risks: None Diabetes: Yes CBG done?: No Did pt. bring in CBG monitor from home?: No  How often do you need to have someone help you when you read instructions, pamphlets, or other written materials from your doctor or pharmacy?: 1 - Never  Diabetic?yes Nutrition Risk Assessment:  Has the patient had any N/V/D within the last 2 months?  No  Does the patient have any non-healing wounds?  No  Has the patient had any unintentional weight loss or weight gain?  No   Diabetes:  Is the patient diabetic?  Yes  If diabetic, was a CBG obtained today?  No  Did the patient bring in their glucometer from home?  No  How often do you monitor your CBG's? continuous.   Financial Strains and Diabetes Management:  Are you having any financial strains with the device, your supplies or your medication? No .  Does the patient want to be seen by Chronic Care Management for management of their diabetes?  No  Would the patient like to be referred to a Nutritionist or for Diabetic Management?  No   Diabetic Exams:  Diabetic Eye Exam: Completed 12/30/21. Pt has been advised about the importance in completing this exam.  Diabetic Foot Exam: Completed 11/24/22. Pt has been advised about the importance in completing this exam.   Interpreter Needed?: No  Information entered by :: Kennedy Bucker, LPN   Activities of Daily Living    12/27/2022   10:36 AM 11/24/2022    9:41 AM  In your present state of health, do you have any difficulty performing the following activities:  Hearing? 1 0  Vision? 0 0  Difficulty concentrating or making decisions? 0 0  Walking or climbing stairs? 1 0  Comment hips & knees weak   Dressing or bathing? 0 0  Doing errands,  shopping? 0 0  Preparing Food and  eating ? N   Using the Toilet? N   In the past six months, have you accidently leaked urine? N   Do you have problems with loss of bowel control? N   Managing your Medications? N   Managing your Finances? N   Housekeeping or managing your Housekeeping? N     Patient Care Team: Marjie Skiff, NP as PCP - General (Nurse Practitioner) Debbrah Alar, MD (Dermatology) Marlowe Sax, RN as Case Manager (General Practice) Zettie Pho, [facility] (Pharmacist)  Indicate any recent Medical Services you may have received from other than [facility] providers in the past year (date may be approximate).     Assessment:   This is a routine wellness examination for Faiza.  Hearing/Vision screen Hearing Screening - Comments:: No aids Vision Screening - Comments:: Wears glasses- [facility]  Dietary issues and exercise activities discussed: Current Exercise Habits: Home exercise routine, Type of exercise: walking, Time (Minutes): 20, Frequency (Times/Week): 6, Weekly Exercise (Minutes/Week): 120, Intensity: Mild   Goals Addressed             This Visit's Progress    DIET - EAT MORE FRUITS AND VEGETABLES         Depression Screen    12/27/2022   10:34 AM 11/24/2022    8:42 AM 09/24/2022   10:29 AM 06/23/2022    8:34 AM 06/23/2022    8:09 AM 05/26/2022    9:43 AM 12/22/2021    8:18 AM  PHQ 2/9 Scores  PHQ - 2 Score 0 0 0 0 0 0 0  PHQ- 9 Score 0 0  0 0 2 0    Fall Risk    12/27/2022   10:36 AM 11/24/2022    8:42 AM 09/24/2022   10:33 AM 06/23/2022    8:09 AM 05/26/2022    9:43 AM  Fall Risk   Falls in the past year? 0 0 0 0 0  Number falls in past yr: 0 0 0 0 0  Injury with Fall? 0 0 0 0 0  Risk for fall due to : No Fall Risks No Fall Risks No Fall Risks No Fall Risks No Fall Risks  Follow up Falls prevention discussed;Falls evaluation completed Falls evaluation completed Falls evaluation completed;Education provided Falls evaluation completed Falls  evaluation completed    FALL RISK PREVENTION PERTAINING TO THE HOME:  Any stairs in or around the home? No  If so, are there any without handrails? No  Home free of loose throw rugs in walkways, pet beds, electrical cords, etc? Yes  Adequate lighting in your home to reduce risk of falls? Yes   ASSISTIVE DEVICES UTILIZED TO PREVENT FALLS:  Life alert? No  Use of a cane, walker or w/c? No  Grab bars in the bathroom? No  Shower chair or bench in shower? No  Elevated toilet seat or a handicapped toilet? Yes    Cognitive Function:        12/27/2022   10:42 AM 11/24/2022    9:42 AM 11/14/2020    1:56 PM 10/31/2019   11:12 AM  6CIT Screen  What Year? 0 points 0 points 0 points 0 points  What month? 0 points 0 points 0 points 0 points  What time? 0 points 0 points 0 points 0 points  Count back from 20 0 points 0 points 0 points 0 points  Months in reverse 0 points 0 points 0 points 0 points  Repeat phrase 0 points 0 points  0 points 0 points  Total Score 0 points 0 points 0 points 0 points    Immunizations Immunization History  Administered Date(s) Administered   Fluad Quad(high Dose 65+) 05/29/2019, 08/08/2020, 08/25/2021, 06/23/2022   Influenza, High Dose Seasonal PF 07/15/2017, 07/27/2018   Influenza-Unspecified 06/21/2016   Pneumococcal Conjugate-13 07/15/2017   Pneumococcal Polysaccharide-23 05/22/2014, 05/29/2019   Td 01/29/2020   Tdap 03/04/2008   Zoster Recombinat (Shingrix) 01/31/2018, 05/02/2018, 05/04/2019   Zoster, Live 02/26/2012    TDAP status: Up to date  Flu Vaccine status: Up to date  Pneumococcal vaccine status: Up to date  Covid-19 vaccine status: Declined, Education has been provided regarding the importance of this vaccine but patient still declined. Advised may receive this vaccine at local pharmacy or Health Dept.or vaccine clinic. Aware to provide a copy of the vaccination record if obtained from local pharmacy or Health Dept. Verbalized acceptance  and understanding.  Qualifies for Shingles Vaccine? Yes   Zostavax completed Yes   Shingrix Completed?: Yes  Screening Tests Health Maintenance  Topic Date Due   OPHTHALMOLOGY EXAM  12/31/2022   INFLUENZA VACCINE  04/21/2023   HEMOGLOBIN A1C  05/27/2023   MAMMOGRAM  08/06/2023   Diabetic kidney evaluation - eGFR measurement  11/24/2023   Diabetic kidney evaluation - Urine ACR  11/24/2023   FOOT EXAM  11/24/2023   Medicare Annual Wellness (AWV)  12/27/2023   DEXA SCAN  05/22/2024   DTaP/Tdap/Td (3 - Td or Tdap) 01/28/2030   COLONOSCOPY (Pts 45-85yrs Insurance coverage will need to be confirmed)  12/08/2032   Pneumonia Vaccine 37+ Years old  Completed   Hepatitis C Screening  Completed   Zoster Vaccines- Shingrix  Completed   HPV VACCINES  Aged Out   COVID-19 Vaccine  Discontinued    Health Maintenance  There are no preventive care reminders to display for this patient.  Colorectal cancer screening: Type of screening: Colonoscopy. Completed 12/09/22. Repeat every 10 years-aged out  Mammogram status: Completed 08/05/22. Repeat every year  Bone Density status: Completed 05/23/19. Results reflect: Bone density results: OSTEOPENIA. Repeat every 5 years.  Lung Cancer Screening: (Low Dose CT Chest recommended if Age 14-80 years, 30 pack-year currently smoking OR have quit w/in 15years.) does not qualify.   Additional Screening:  Hepatitis C Screening: does qualify; Completed 10/14/17  Vision Screening: Recommended annual ophthalmology exams for early detection of glaucoma and other disorders of the eye. Is the patient up to date with their annual eye exam?  Yes  Who is the provider or what is the name of the office in which the patient attends annual eye exams? [facility] If pt is not established with a provider, would they like to be referred to a provider to establish care? No .   Dental Screening: Recommended annual dental exams for proper oral hygiene  Community Resource  Referral / Chronic Care Management: CRR required this visit?  No   CCM required this visit?  No      Plan:     I have personally reviewed and noted the following in the patient's chart:   Medical and social history Use of alcohol, tobacco or illicit drugs  Current medications and supplements including opioid prescriptions. Patient is not currently taking opioid prescriptions. Functional ability and status Nutritional status Physical activity Advanced directives List of other physicians Hospitalizations, surgeries, and ER visits in previous 12 months Vitals Screenings to include cognitive, depression, and falls Referrals and appointments  In addition, I have reviewed and discussed with  patient certain preventive protocols, quality metrics, and best practice recommendations. A written personalized care plan for preventive services as well as general preventive health recommendations were provided to patient.     Hal Hope, LPN   09/25/1094   Nurse Notes: none

## 2022-12-30 ENCOUNTER — Encounter: Payer: Self-pay | Admitting: Nurse Practitioner

## 2022-12-30 ENCOUNTER — Other Ambulatory Visit: Payer: Self-pay | Admitting: Nurse Practitioner

## 2022-12-30 MED ORDER — INSULIN PEN NEEDLE 31G X 8 MM MISC: 4 refills | Status: DC

## 2022-12-30 NOTE — Telephone Encounter (Signed)
Unable to refill per protocol, Rx request is too soon. Last refill 11/19/22 for 90 days.  Requested Prescriptions  Pending Prescriptions Disp Refills   JARDIANCE 25 MG TABS tablet [Pharmacy Med Name: JARDIANCE 25 MG TABLET] 90 tablet 0    Sig: TAKE 1 TABLET (25 MG TOTAL) BY MOUTH DAILY.     Endocrinology:  Diabetes - SGLT2 Inhibitors Passed - 12/30/2022  8:44 AM      Passed - Cr in normal range and within 360 days    Creatinine, Ser  Date Value Ref Range Status  11/24/2022 0.95 0.57 - 1.00 mg/dL Final         Passed - HBA1C is between 0 and 7.9 and within 180 days    HB A1C (BAYER DCA - WAIVED)  Date Value Ref Range Status  11/24/2022 6.7 (H) 4.8 - 5.6 % Final    Comment:             Prediabetes: 5.7 - 6.4          Diabetes: >6.4          Glycemic control for adults with diabetes: <7.0          Passed - eGFR in normal range and within 360 days    GFR calc Af Amer  Date Value Ref Range Status  05/08/2020 95 >59 mL/min/1.73 Final    Comment:    **[facility] currently reports eGFR in compliance with the current**   recommendations of the [employer]. [facility] will   update reporting as new guidelines are published from the NKF-ASN   Task force.    GFR, Estimated  Date Value Ref Range Status  05/24/2022 >60 >60 mL/min Final    Comment:    (NOTE) Calculated using the CKD-EPI Creatinine Equation (2021)    eGFR  Date Value Ref Range Status  11/24/2022 64 >59 mL/min/1.73 Final         Passed - Valid encounter within last 6 months    Recent Outpatient Visits           1 month ago Medicare annual wellness visit, subsequent   [place] [facility] [place], Fair[place] T, NP   3 months ago Insulin dependent type 2 diabetes mellitus (HCC)   [place] [facility] [place], Raemon T, NP   4 months ago Type 2 diabetes mellitus with obesity (HCC)   [place] [facility] [employer], Remy T, NP   5 months ago Type 2 diabetes  mellitus with obesity (HCC)   [place] [facility] [place], [place] T, NP   6 months ago Type 2 diabetes mellitus with obesity (HCC)    [facility] [place], Dorie Rank, NP       Future Appointments             In 1 month [place], Dorie Rank, NP  [facility]

## 2023-01-14 ENCOUNTER — Telehealth: Payer: Medicare Other

## 2023-01-14 ENCOUNTER — Ambulatory Visit (INDEPENDENT_AMBULATORY_CARE_PROVIDER_SITE_OTHER): Payer: Medicare Other

## 2023-01-14 DIAGNOSIS — E1169 Type 2 diabetes mellitus with other specified complication: Secondary | ICD-10-CM

## 2023-01-14 DIAGNOSIS — E119 Type 2 diabetes mellitus without complications: Secondary | ICD-10-CM

## 2023-01-14 DIAGNOSIS — I152 Hypertension secondary to endocrine disorders: Secondary | ICD-10-CM

## 2023-01-14 NOTE — Chronic Care Management (AMB) (Signed)
Chronic Care Management   CCM RN Visit Note  01/14/2023 Name: Charlene Juarez MRN: [id number] DOB: 1951/05/05  Subjective: Charlene Juarez is a 72 y.o. year old female who is a primary care patient of Cannady, Dorie Rank, NP. The patient was referred to the Chronic Care Management team for assistance with care management needs subsequent to provider initiation of CCM services and plan of care.    Today's Visit:  Engaged with patient by telephone for follow up visit.        Goals Addressed             This Visit's Progress    CCM Expected Outcome:  Monitor, Self-Manage and Reduce Symptoms of Diabetes       Current Barriers:  Knowledge Deficits related to diabetes management education and benefits of controlled DM  Care Coordination needs related to denial of Dexcom and reordering of Dexcom meter, may need pharm D support in the future in a patient with DM  Chronic Disease Management support and education needs related to effective management of DM Lab Results  Component Value Date   HGBA1C 6.7 (H) 11/24/2022    Planned Interventions: Provided education to patient about basic DM disease process. The patient is concerned about her DM. She is hopeful to get the dexcom sensors approved for use and is waiting to hear back on this. She says it holds her more accountable because she can see what her blood sugars are doing on a consistent basis. Education and support given. Sensors were approved and she is thankful as this helps her be more accountable. She has improved her A1C and is happy about this. The biggest concern today is that her tinnitus is causing issues. Will attach Epley's exercises in the AVS for myChart; Reviewed medications with patient and discussed importance of medication adherence. The patient has been frustrated with all of the insurance changes and what the insurance will and will not pay for. The patient wants to ask the pcp about going back on Mounjaro  at a lower dose to see if she can tolerate it. She was having issues when she was at 7.5mg . Will collaborate with the pcp concerning the patient request. The patient has an upcoming appointment with the pcp on 11-24-2022 and encouraged the patient to discuss with the pcp this.  She feels like she can control her blood sugars better with having Mounjaro on board. The patient could not continue taking the [facility]. She states that she is taking [place] and it is working well for her. She says her sugars have leveled out. Education and support given.        Reviewed prescribed diet with patient heart healthy/ADA diet. Discussed making sure she eats a light snack at bedtime to help with regulation of sugars during sleep times. She is eating snacks at bedtime and this is helping keep her blood sugars stable during the night; Counseled on importance of regular laboratory monitoring as prescribed. The patient is under goal with A1C and is thankful for this. Review of maintaining good control of her blood sugars.    Discussed plans with patient for ongoing care management follow up and provided patient with direct contact information for care management team;      Provided patient with written educational materials related to hypo and hyperglycemia and importance of correct treatment. With the Dexcom meter she has had some lows in the less than 50 and highest she has seen is 229. The patient knows how  to effectively manage her hypo and hyperglycemia. Recommendations provided.     Reviewed scheduled/upcoming provider appointments including: 02-24-2023 with the pcp.        Advised patient, providing education and rationale, to check cbg when you have symptoms of low or high blood sugar and has a continuous glucose reader at this time  and record. Currently is doing finger sticks. The patient has her Dexcom G-7 and it is doing well for her. She is happy she has that. Will ask pcp if there is additional paperwork she has to  fill out for keeping the dexcom 7 call provider for findings outside established parameters;       Review of patient status, including review of consultants reports, relevant laboratory and other test results, and medications completed;       Advised patient to discuss changes in DM health and well being with provider;      Screening for signs and symptoms of depression related to chronic disease state;        Assessed social determinant of health barriers;    The patient is having some tinnitus in her ears that she has noticed more recently. She has a sound machine and that helps a lot most of the time but not all of the time. Will send information to the patient about the Epley maneuver to see if this possibly may help with her tinnitus she is experiencing. She states this has been going on for a while. Denies any safety concerns. She is having issues with sleep patterns at times with the tinnitus. Wants to ask the pcp about a prn dose of Ambien.  States melatonin is not helpful.  Symptom Management: Take medications as prescribed   Attend all scheduled provider appointments Call provider office for new concerns or questions  call the Suicide and Crisis Lifeline: 988 call the [place] National Suicide Prevention Lifeline: (646)739-0814 or TTY: 3180463301 TTY (571) 195-1167) to talk to a trained counselor call 1-800-273-TALK (toll free, 24 hour hotline) if experiencing a Mental Health or Behavioral Health Crisis  keep appointment with eye doctor check feet daily for cuts, sores or redness trim toenails straight across manage portion size wash and dry feet carefully every day wear comfortable, cotton socks wear comfortable, well-fitting shoes  Follow Up Plan: Telephone follow up appointment with care management team member scheduled for: 03-18-2023 at 1030 am       CCM Expected Outcome:  Monitor, Self-Manage and Reduce Symptoms of: HLD       Current Barriers:  Chronic Disease Management  support and education needs related to for effective management of HLD Lab Results  Component Value Date   CHOL 126 11/24/2022   HDL 53 11/24/2022   LDLCALC 50 11/24/2022   TRIG 136 11/24/2022     Planned Interventions: Provider established cholesterol goals reviewed. The patient is at goal.  Education and support given. ; Counseled on importance of regular laboratory monitoring as prescribed; Provided HLD educational materials; Reviewed role and benefits of statin for ASCVD risk reduction; Discussed strategies to manage statin-induced myalgias; Reviewed importance of limiting foods high in cholesterol; Reviewed exercise goals and target of 150 minutes per week; Screening for signs and symptoms of depression related to chronic disease state;  Assessed social determinant of health barriers;   Symptom Management: Take medications as prescribed   Attend all scheduled provider appointments Call provider office for new concerns or questions  call the Suicide and Crisis Lifeline: 988 call the [place] National Suicide Prevention Lifeline:  (508)270-7038 or TTY: (249)067-4642 TTY 954-573-8897) to talk to a trained counselor call 1-800-273-TALK (toll free, 24 hour hotline) if experiencing a Mental Health or Behavioral Health Crisis  - call for medicine refill 2 or 3 days before it runs out - take all medications exactly as prescribed - call doctor with any symptoms you believe are related to your medicine - call doctor when you experience any new symptoms - go to all doctor appointments as scheduled - adhere to prescribed diet: heart healthy/ADA diet  - develop an exercise routine  Follow Up Plan: Telephone follow up appointment with care management team member scheduled for: 03-18-2023 at 1030 am       CCM Expected Outcome:  Monitor, Self-Manage, and Reduce Symptoms of Hypertension       Current Barriers:  Knowledge Deficits related to the benefits for taking blood pressures on a regular  basis and recording Chronic Disease Management support and education needs related to effective management of HTN BP Readings from Last 3 Encounters:  12/09/22 123/74  11/24/22 127/74  09/23/22 133/76     Planned Interventions: Evaluation of current treatment plan related to hypertension self management and patient's adherence to plan as established by provider. The patient is doing well with management of her blood pressures.  Denies any acute changes in her HTN or heart health. Blood pressures are stable;   Provided education to patient re: stroke prevention, s/s of heart attack and stroke. Education and support; Reviewed prescribed diet heart healthy/ADA diet. The patient is mindful of her dietary intake. The patient watches what she eats. She is compliant with dietary restrictions.   Reviewed medications with patient and discussed importance of compliance. The patient is compliant with medications. Working with the pharm D for assistance with medications.;  Counseled on the importance of exercise goals with target of 150 minutes per week. The patient is active and loves to garden and do arrangements with flowers. Education on walking at least 15 minutes at a moderate pace can improve blood sugars, help with heart function and circulation. Encouraged increased activity.  Discussed plans with patient for ongoing care management follow up and provided patient with direct contact information for care management team; Advised patient, providing education and rationale, to monitor blood pressure daily and record, calling PCP for findings outside established parameters. The patient normally does not take blood pressures at home. Has a cuff. Education and support given on the benefits of checking blood pressures on a regular basis;  Reviewed scheduled/upcoming provider appointments including: 02-24-2023 at 1000 am with the pcp Advised patient to discuss changes in her blood pressures and heart health with  provider, reminder provided today; Provided education on prescribed diet heart healthy/ADA diet. Talked about eating smaller more frequent meals to keep her chronic conditions stable ;  Discussed complications of poorly controlled blood pressure such as heart disease, stroke, circulatory complications, vision complications, kidney impairment, sexual dysfunction;  Screening for signs and symptoms of depression related to chronic disease state;  Assessed social determinant of health barriers;   Symptom Management: Take medications as prescribed   Attend all scheduled provider appointments Call provider office for new concerns or questions  call the Suicide and Crisis Lifeline: 988 call the [place] National Suicide Prevention Lifeline: 564-191-7082 or TTY: 819-090-8785 TTY 437-119-4702) to talk to a trained counselor call 1-800-273-TALK (toll free, 24 hour hotline) if experiencing a Mental Health or Behavioral Health Crisis  check blood pressure weekly learn about high blood pressure call doctor for signs  and symptoms of high blood pressure develop an action plan for high blood pressure keep all doctor appointments take medications for blood pressure exactly as prescribed begin an exercise program report new symptoms to your doctor  Follow Up Plan: Telephone follow up appointment with care management team member scheduled for: 03-18-2023 at 1030 am          Plan:Telephone follow up appointment with care management team member scheduled for:  03-18-2023 at 1030 am  Alto Denver RN, MSN, CCM RN Care Manager  Chronic Care Management Direct Number: [phone]

## 2023-01-14 NOTE — Patient Instructions (Signed)
Please call the care guide team at [phone] if you need to cancel or reschedule your appointment.   If you are experiencing a Mental Health or Behavioral Health Crisis or need someone to talk to, please call the Suicide and Crisis Lifeline: 988 call the [place] National Suicide Prevention Lifeline: 254-479-3068 or TTY: 778-185-6009 TTY 629 373 9156) to talk to a trained counselor call 1-800-273-TALK (toll free, 24 hour hotline)   Following is a copy of the CCM Program Consent:  CCM service includes personalized support from designated clinical staff supervised by the physician, including individualized plan of care and coordination with other care providers 24/7 contact phone numbers for assistance for urgent and routine care needs. Service will only be billed when office clinical staff spend 20 minutes or more in a month to coordinate care. Only one practitioner may furnish and bill the service in a calendar month. The patient may stop CCM services at amy time (effective at the end of the month) by phone call to the office staff. The patient will be responsible for cost sharing (co-pay) or up to 20% of the service fee (after annual deductible is met)  Following is a copy of your full provider care plan:   Goals Addressed             This Visit's Progress    CCM Expected Outcome:  Monitor, Self-Manage and Reduce Symptoms of Diabetes       Current Barriers:  Knowledge Deficits related to diabetes management education and benefits of controlled DM  Care Coordination needs related to denial of Dexcom and reordering of Dexcom meter, may need pharm D support in the future in a patient with DM  Chronic Disease Management support and education needs related to effective management of DM Lab Results  Component Value Date   HGBA1C 6.7 (H) 11/24/2022    Planned Interventions: Provided education to patient about basic DM disease process. The patient is concerned about her DM. She is hopeful  to get the dexcom sensors approved for use and is waiting to hear back on this. She says it holds her more accountable because she can see what her blood sugars are doing on a consistent basis. Education and support given. Sensors were approved and she is thankful as this helps her be more accountable. She has improved her A1C and is happy about this. The biggest concern today is that her tinnitus is causing issues. Will attach Epley's exercises in the AVS for myChart; Reviewed medications with patient and discussed importance of medication adherence. The patient has been frustrated with all of the insurance changes and what the insurance will and will not pay for. The patient wants to ask the pcp about going back on Mounjaro at a lower dose to see if she can tolerate it. She was having issues when she was at 7.5mg . Will collaborate with the pcp concerning the patient request. The patient has an upcoming appointment with the pcp on 11-24-2022 and encouraged the patient to discuss with the pcp this.  She feels like she can control her blood sugars better with having Mounjaro on board. The patient could not continue taking the [place] [employer]. She states that she is taking [place] and it is working well for her. She says her sugars have leveled out. Education and support given.        Reviewed prescribed diet with patient heart healthy/ADA diet. Discussed making sure she eats a light snack at bedtime to help with regulation of sugars during sleep times.  She is eating snacks at bedtime and this is helping keep her blood sugars stable during the night; Counseled on importance of regular laboratory monitoring as prescribed. The patient is under goal with A1C and is thankful for this. Review of maintaining good control of her blood sugars.    Discussed plans with patient for ongoing care management follow up and provided patient with direct contact information for care management team;      Provided patient with written  educational materials related to hypo and hyperglycemia and importance of correct treatment. With the Dexcom meter she has had some lows in the less than 50 and highest she has seen is 229. The patient knows how to effectively manage her hypo and hyperglycemia. Recommendations provided.     Reviewed scheduled/upcoming provider appointments including: 02-24-2023 with the pcp.        Advised patient, providing education and rationale, to check cbg when you have symptoms of low or high blood sugar and has a continuous glucose reader at this time  and record. Currently is doing finger sticks. The patient has her Dexcom G-7 and it is doing well for her. She is happy she has that. Will ask pcp if there is additional paperwork she has to fill out for keeping the dexcom 7 call provider for findings outside established parameters;       Review of patient status, including review of consultants reports, relevant laboratory and other test results, and medications completed;       Advised patient to discuss changes in DM health and well being with provider;      Screening for signs and symptoms of depression related to chronic disease state;        Assessed social determinant of health barriers;    The patient is having some tinnitus in her ears that she has noticed more recently. She has a sound machine and that helps a lot most of the time but not all of the time. Will send information to the patient about the Epley maneuver to see if this possibly may help with her tinnitus she is experiencing. She states this has been going on for a while. Denies any safety concerns. She is having issues with sleep patterns at times with the tinnitus. Wants to ask the pcp about a prn dose of Ambien.  States melatonin is not helpful.  Symptom Management: Take medications as prescribed   Attend all scheduled provider appointments Call provider office for new concerns or questions  call the Suicide and Crisis Lifeline: 988 call  the [place] National Suicide Prevention Lifeline: 204-260-6222 or TTY: 262-151-0766 TTY [phone]) to talk to a trained counselor call 1-800-273-TALK (toll free, 24 hour hotline) if experiencing a Mental Health or Behavioral Health Crisis  keep appointment with eye doctor check feet daily for cuts, sores or redness trim toenails straight across manage portion size wash and dry feet carefully every day wear comfortable, cotton socks wear comfortable, well-fitting shoes  Follow Up Plan: Telephone follow up appointment with care management team member scheduled for: 03-18-2023 at 1030 am       CCM Expected Outcome:  Monitor, Self-Manage and Reduce Symptoms of: HLD       Current Barriers:  Chronic Disease Management support and education needs related to for effective management of HLD Lab Results  Component Value Date   CHOL 126 11/24/2022   HDL 53 11/24/2022   LDLCALC 50 11/24/2022   TRIG 136 11/24/2022     Planned Interventions: Provider established  cholesterol goals reviewed. The patient is at goal.  Education and support given. ; Counseled on importance of regular laboratory monitoring as prescribed; Provided HLD educational materials; Reviewed role and benefits of statin for ASCVD risk reduction; Discussed strategies to manage statin-induced myalgias; Reviewed importance of limiting foods high in cholesterol; Reviewed exercise goals and target of 150 minutes per week; Screening for signs and symptoms of depression related to chronic disease state;  Assessed social determinant of health barriers;   Symptom Management: Take medications as prescribed   Attend all scheduled provider appointments Call provider office for new concerns or questions  call the Suicide and Crisis Lifeline: 988 call the [place] National Suicide Prevention Lifeline: (260)012-6113 or TTY: 878-578-2533 TTY (847) 094-6598) to talk to a trained counselor call 1-800-273-TALK (toll free, 24 hour hotline) if  experiencing a Mental Health or Behavioral Health Crisis  - call for medicine refill 2 or 3 days before it runs out - take all medications exactly as prescribed - call doctor with any symptoms you believe are related to your medicine - call doctor when you experience any new symptoms - go to all doctor appointments as scheduled - adhere to prescribed diet: heart healthy/ADA diet  - develop an exercise routine  Follow Up Plan: Telephone follow up appointment with care management team member scheduled for: 03-18-2023 at 1030 am       CCM Expected Outcome:  Monitor, Self-Manage, and Reduce Symptoms of Hypertension       Current Barriers:  Knowledge Deficits related to the benefits for taking blood pressures on a regular basis and recording Chronic Disease Management support and education needs related to effective management of HTN BP Readings from Last 3 Encounters:  12/09/22 123/74  11/24/22 127/74  09/23/22 133/76     Planned Interventions: Evaluation of current treatment plan related to hypertension self management and patient's adherence to plan as established by provider. The patient is doing well with management of her blood pressures.  Denies any acute changes in her HTN or heart health. Blood pressures are stable;   Provided education to patient re: stroke prevention, s/s of heart attack and stroke. Education and support; Reviewed prescribed diet heart healthy/ADA diet. The patient is mindful of her dietary intake. The patient watches what she eats. She is compliant with dietary restrictions.   Reviewed medications with patient and discussed importance of compliance. The patient is compliant with medications. Working with the pharm D for assistance with medications.;  Counseled on the importance of exercise goals with target of 150 minutes per week. The patient is active and loves to garden and do arrangements with flowers. Education on walking at least 15 minutes at a moderate pace  can improve blood sugars, help with heart function and circulation. Encouraged increased activity.  Discussed plans with patient for ongoing care management follow up and provided patient with direct contact information for care management team; Advised patient, providing education and rationale, to monitor blood pressure daily and record, calling PCP for findings outside established parameters. The patient normally does not take blood pressures at home. Has a cuff. Education and support given on the benefits of checking blood pressures on a regular basis;  Reviewed scheduled/upcoming provider appointments including: 02-24-2023 at 1000 am with the pcp Advised patient to discuss changes in her blood pressures and heart health with provider, reminder provided today; Provided education on prescribed diet heart healthy/ADA diet. Talked about eating smaller more frequent meals to keep her chronic conditions stable ;  Discussed complications  of poorly controlled blood pressure such as heart disease, stroke, circulatory complications, vision complications, kidney impairment, sexual dysfunction;  Screening for signs and symptoms of depression related to chronic disease state;  Assessed social determinant of health barriers;   Symptom Management: Take medications as prescribed   Attend all scheduled provider appointments Call provider office for new concerns or questions  call the Suicide and Crisis Lifeline: 988 call the [place] National Suicide Prevention Lifeline: 401-248-5075 or TTY: (617)607-9804 TTY 251-533-9440) to talk to a trained counselor call 1-800-273-TALK (toll free, 24 hour hotline) if experiencing a Mental Health or Behavioral Health Crisis  check blood pressure weekly learn about high blood pressure call doctor for signs and symptoms of high blood pressure develop an action plan for high blood pressure keep all doctor appointments take medications for blood pressure exactly as  prescribed begin an exercise program report new symptoms to your doctor  Follow Up Plan: Telephone follow up appointment with care management team member scheduled for: 03-18-2023 at 1030 am          Patient verbalizes understanding of instructions and care plan provided today and agrees to view in MyChart. Active MyChart status and patient understanding of how to access instructions and care plan via MyChart confirmed with patient.  Telephone follow up appointment with care management team member scheduled for: 03-18-2023 at 1030 am

## 2023-01-18 DIAGNOSIS — Z794 Long term (current) use of insulin: Secondary | ICD-10-CM

## 2023-01-18 DIAGNOSIS — E785 Hyperlipidemia, unspecified: Secondary | ICD-10-CM

## 2023-01-18 DIAGNOSIS — I10 Essential (primary) hypertension: Secondary | ICD-10-CM

## 2023-01-18 DIAGNOSIS — E1159 Type 2 diabetes mellitus with other circulatory complications: Secondary | ICD-10-CM

## 2023-02-14 NOTE — Patient Instructions (Signed)
Diabetes Mellitus Basics  Diabetes mellitus, or diabetes, is a long-term (chronic) disease. It occurs when the body does not properly use sugar (glucose) that is released from food after you eat. Diabetes mellitus may be caused by one or both of these problems: Your pancreas does not make enough of a hormone called insulin. Your body does not react in a normal way to the insulin that it makes. Insulin lets glucose enter cells in your body. This gives you energy. If you have diabetes, glucose cannot get into cells. This causes high blood glucose (hyperglycemia). How to treat and manage diabetes You may need to take insulin or other diabetes medicines daily to keep your glucose in balance. If you are prescribed insulin, you will learn how to give yourself insulin by injection. You may need to adjust the amount of insulin you take based on the foods that you eat. You will need to check your blood glucose levels using a glucose monitor as told by your health care provider. The readings can help determine if you have low or high blood glucose. Generally, you should have these blood glucose levels: Before meals (preprandial): 80-130 mg/dL (4.4-7.2 mmol/L). After meals (postprandial): below 180 mg/dL (10 mmol/L). Hemoglobin A1c (HbA1c) level: less than 7%. Your health care provider will set treatment goals for you. Keep all follow-up visits. This is important. Follow these instructions at home: Diabetes medicines Take your diabetes medicines every day as told by your health care provider. List your diabetes medicines here: Name of medicine: ______________________________ Amount (dose): _______________ Time (a.m./p.m.): _______________ Notes: ___________________________________ Name of medicine: ______________________________ Amount (dose): _______________ Time (a.m./p.m.): _______________ Notes: ___________________________________ Name of medicine: ______________________________ Amount (dose):  _______________ Time (a.m./p.m.): _______________ Notes: ___________________________________ Insulin If you use insulin, list the types of insulin you use here: Insulin type: ______________________________ Amount (dose): _______________ Time (a.m./p.m.): _______________Notes: ___________________________________ Insulin type: ______________________________ Amount (dose): _______________ Time (a.m./p.m.): _______________ Notes: ___________________________________ Insulin type: ______________________________ Amount (dose): _______________ Time (a.m./p.m.): _______________ Notes: ___________________________________ Insulin type: ______________________________ Amount (dose): _______________ Time (a.m./p.m.): _______________ Notes: ___________________________________ Insulin type: ______________________________ Amount (dose): _______________ Time (a.m./p.m.): _______________ Notes: ___________________________________ Managing blood glucose  Check your blood glucose levels using a glucose monitor as told by your health care provider. Write down the times that you check your glucose levels here: Time: _______________ Notes: ___________________________________ Time: _______________ Notes: ___________________________________ Time: _______________ Notes: ___________________________________ Time: _______________ Notes: ___________________________________ Time: _______________ Notes: ___________________________________ Time: _______________ Notes: ___________________________________  Low blood glucose Low blood glucose (hypoglycemia) is when glucose is at or below 70 mg/dL (3.9 mmol/L). Symptoms may include: Feeling: Hungry. Sweaty and clammy. Irritable or easily upset. Dizzy. Sleepy. Having: A fast heartbeat. A headache. A change in your vision. Numbness around the mouth, lips, or tongue. Having trouble with: Moving (coordination). Sleeping. Treating low blood glucose To treat low blood  glucose, eat or drink something containing sugar right away. If you can think clearly and swallow safely, follow the 15:15 rule: Take 15 grams of a fast-acting carb (carbohydrate), as told by your health care provider. Some fast-acting carbs are: Glucose tablets: take 3-4 tablets. Hard candy: eat 3-5 pieces. Fruit juice: drink 4 oz (120 mL). Regular (not diet) soda: drink 4-6 oz (120-180 mL). Honey or sugar: eat 1 Tbsp (15 mL). Check your blood glucose levels 15 minutes after you take the carb. If your glucose is still at or below 70 mg/dL (3.9 mmol/L), take 15 grams of a carb again. If your glucose does not go above 70 mg/dL (3.9 mmol/L) after   3 tries, get help right away. After your glucose goes back to normal, eat a meal or a snack within 1 hour. Treating very low blood glucose If your glucose is at or below 54 mg/dL (3 mmol/L), you have very low blood glucose (severe hypoglycemia). This is an emergency. Do not wait to see if the symptoms will go away. Get medical help right away. Call your local emergency services (911 in the [place] Do not drive yourself to the hospital. Questions to ask your health care provider Should I talk with a diabetes educator? What equipment will I need to care for myself at home? What diabetes medicines do I need? When should I take them? How often do I need to check my blood glucose levels? What number can I call if I have questions? When is my follow-up visit? Where can I find a support group for people with diabetes? Where to find more information American Diabetes Association: www.diabetes.org Association of Diabetes Care and Education Specialists: www.diabeteseducator.org Contact a health care provider if: Your blood glucose is at or above 240 mg/dL (13.3 mmol/L) for 2 days in a row. You have been sick or have had a fever for 2 days or more, and you are not getting better. You have any of these problems for more than 6 hours: You cannot eat or  drink. You feel nauseous. You vomit. You have diarrhea. Get help right away if: Your blood glucose is lower than 54 mg/dL (3 mmol/L). You get confused. You have trouble thinking clearly. You have trouble breathing. These symptoms may represent a serious problem that is an emergency. Do not wait to see if the symptoms will go away. Get medical help right away. Call your local emergency services (911 in the [place] Do not drive yourself to the hospital. Summary Diabetes mellitus is a chronic disease that occurs when the body does not properly use sugar (glucose) that is released from food after you eat. Take insulin and diabetes medicines as told. Check your blood glucose every day, as often as told. Keep all follow-up visits. This is important. This information is not intended to replace advice given to you by your health care provider. Make sure you discuss any questions you have with your health care provider. Document Revised: 01/08/2020 Document Reviewed: 01/08/2020 Elsevier Patient Education  2024 Elsevier Inc.  

## 2023-02-17 ENCOUNTER — Other Ambulatory Visit: Payer: Self-pay | Admitting: Nurse Practitioner

## 2023-02-18 NOTE — Telephone Encounter (Signed)
Requested Prescriptions  Pending Prescriptions Disp Refills   JARDIANCE 25 MG TABS tablet [Pharmacy Med Name: JARDIANCE 25 MG TABLET] 90 tablet 0    Sig: TAKE 1 TABLET (25 MG TOTAL) BY MOUTH DAILY.     Endocrinology:  Diabetes - SGLT2 Inhibitors Passed - 02/17/2023  8:58 PM      Passed - Cr in normal range and within 360 days    Creatinine, Ser  Date Value Ref Range Status  11/24/2022 0.95 0.57 - 1.00 mg/dL Final         Passed - HBA1C is between 0 and 7.9 and within 180 days    HB A1C (BAYER DCA - WAIVED)  Date Value Ref Range Status  11/24/2022 6.7 (H) 4.8 - 5.6 % Final    Comment:             Prediabetes: 5.7 - 6.4          Diabetes: >6.4          Glycemic control for adults with diabetes: <7.0          Passed - eGFR in normal range and within 360 days    GFR calc Af Amer  Date Value Ref Range Status  05/08/2020 95 >59 mL/min/1.73 Final    Comment:    **[facility] currently reports eGFR in compliance with the current**   recommendations of the [employer]. [facility] will   update reporting as new guidelines are published from the NKF-ASN   Task force.    GFR, Estimated  Date Value Ref Range Status  05/24/2022 >60 >60 mL/min Final    Comment:    (NOTE) Calculated using the CKD-EPI Creatinine Equation (2021)    eGFR  Date Value Ref Range Status  11/24/2022 64 >59 mL/min/1.73 Final         Passed - Valid encounter within last 6 months    Recent Outpatient Visits           2 months ago Medicare annual wellness visit, subsequent   [place] [facility] [place], [place] T, NP   4 months ago Insulin dependent type 2 diabetes mellitus (HCC)   [place] [facility] [place], Aline T, NP   5 months ago Type 2 diabetes mellitus with obesity (HCC)   [facility] [place], [place] T, NP   6 months ago Type 2 diabetes mellitus with obesity (HCC)   [place] [facility] [place], [place] T, NP   8  months ago Type 2 diabetes mellitus with obesity (HCC)   [place] [facility] [place], Dorie Rank, NP       Future Appointments             In 6 days [place], Dorie Rank, NP  [facility]

## 2023-02-24 ENCOUNTER — Encounter: Payer: Self-pay | Admitting: Nurse Practitioner

## 2023-02-24 ENCOUNTER — Ambulatory Visit (INDEPENDENT_AMBULATORY_CARE_PROVIDER_SITE_OTHER): Payer: Medicare Other | Admitting: Nurse Practitioner

## 2023-02-24 VITALS — BP 127/74 | HR 71 | Temp 97.9°F | Ht 62.01 in | Wt 171.6 lb

## 2023-02-24 DIAGNOSIS — Z794 Long term (current) use of insulin: Secondary | ICD-10-CM

## 2023-02-24 DIAGNOSIS — E1159 Type 2 diabetes mellitus with other circulatory complications: Secondary | ICD-10-CM

## 2023-02-24 DIAGNOSIS — E785 Hyperlipidemia, unspecified: Secondary | ICD-10-CM

## 2023-02-24 DIAGNOSIS — E6609 Other obesity due to excess calories: Secondary | ICD-10-CM

## 2023-02-24 DIAGNOSIS — J4520 Mild intermittent asthma, uncomplicated: Secondary | ICD-10-CM

## 2023-02-24 DIAGNOSIS — E1169 Type 2 diabetes mellitus with other specified complication: Secondary | ICD-10-CM

## 2023-02-24 DIAGNOSIS — R7989 Other specified abnormal findings of blood chemistry: Secondary | ICD-10-CM

## 2023-02-24 DIAGNOSIS — E119 Type 2 diabetes mellitus without complications: Secondary | ICD-10-CM

## 2023-02-24 DIAGNOSIS — Z6830 Body mass index (BMI) 30.0-30.9, adult: Secondary | ICD-10-CM | POA: Diagnosis not present

## 2023-02-24 DIAGNOSIS — G4733 Obstructive sleep apnea (adult) (pediatric): Secondary | ICD-10-CM

## 2023-02-24 DIAGNOSIS — I152 Hypertension secondary to endocrine disorders: Secondary | ICD-10-CM | POA: Diagnosis not present

## 2023-02-24 DIAGNOSIS — K760 Fatty (change of) liver, not elsewhere classified: Secondary | ICD-10-CM | POA: Diagnosis not present

## 2023-02-24 DIAGNOSIS — H9313 Tinnitus, bilateral: Secondary | ICD-10-CM

## 2023-02-24 LAB — BAYER DCA HB A1C WAIVED: HB A1C (BAYER DCA - WAIVED): 5.7 % — ABNORMAL HIGH (ref 4.8–5.6)

## 2023-02-24 MED ORDER — TRAZODONE HCL 50 MG PO TABS: 50.0000 mg | ORAL_TABLET | Freq: Every evening | ORAL | 4 refills | Status: DC | PRN

## 2023-02-24 NOTE — Assessment & Plan Note (Addendum)
Chronic, stable.  Recheck B12 level next visit and continue supplement.

## 2023-02-24 NOTE — Assessment & Plan Note (Signed)
Chronic, stable.  BP at goal in office and at home.  Continue Telmisartan for kidney and remain off HCTZ as could diminish effects of insulin == may need to consider Amlodipine if BP trends up in future.  LABS: CMP.  Urine ALB 28 November 2022.  Recommend she continue to check BP at home on occasion and document for visits. Focus on DASH diet.  Return in 3 months.

## 2023-02-24 NOTE — Assessment & Plan Note (Signed)
Chronic, does not consistently wear CPAP.  Recommend 100% use. 

## 2023-02-24 NOTE — Assessment & Plan Note (Signed)
Continue current supplement and plan for DEXA scan repeat in 2025 due to osteopenia.Marland Kitchen

## 2023-02-24 NOTE — Assessment & Plan Note (Addendum)
Overall exam stable.  No cerumen present.  Will place referral to ENT for further assessment due to chronic nature of this, suspect some hearing loss presenting.  Trazodone sent in to help with sleep while having issues with tinnitus.

## 2023-02-24 NOTE — Assessment & Plan Note (Signed)
Chronic, noted on past imaging. At this time recommend heavy focus on diet and regular activity + working on diabetes control with goal A1c <7%. 

## 2023-02-24 NOTE — Progress Notes (Signed)
BP 127/74   Pulse 71   Temp 97.9 F (36.6 C) (Oral)   Ht 5' 2.01" (1.575 m)   Wt 171 lb 9.6 oz (77.8 kg)   LMP 07/13/2000 (Approximate)   SpO2 96%   BMI 31.38 kg/m    Subjective:    Patient ID: Charlene Juarez, female    DOB: 1950/12/05, 72 y.o.   MRN: [id number]  HPI: Charlene Juarez is a 72 y.o. female  Chief Complaint  Patient presents with   Diabetes   Hyperlipidemia   Hypertension   Buzzing    Patient states that she has had a buzzing in her head for the past few days and finding it hard to sleep because of it.   DIABETES A1c 6.7% March. Taking Tresiba 40 units, Jardiance 25 MG, Metformin 1000 MG BID, Novolog as needed before meals (12 units if needed).  Taking Gabapentin 100 MG BID for neuropathy. Can not take GLP1 or Mounjaro due to pancreatitis with these.  History of fatty liver noted on past imaging.  Continues on B12 and Vit D supplements daily. Hypoglycemic episodes:no Polydipsia/polyuria: no Visual disturbance: no Chest pain: no Paresthesias: no Glucose Monitoring: no             Accucheck frequency: daily - average over past 30 days = 115, 87% of time in range, 5% high, 5% low             Fasting glucose:              Post prandial:             Evening:              Before meals: Taking Insulin?: yes             Long acting insulin: 40 units             Short acting insulin: 5 to 12 units Blood Pressure Monitoring: yes Retinal Examination: Not Up To Date -- [facility], needs reschedule Foot Exam: Up to Date Pneumovax: Up to Date Influenza: Up to Date Aspirin: no   HYPERTENSION / HYPERLIPIDEMIA Continues on Telmisartan + Crestor.  HCTZ was discontinued due to use of insulin.  Is supposed to wear CPAP at home, but does not use consistently. Satisfied with current treatment? yes Duration of hypertension: chronic BP monitoring frequency: not often BP range:  BP medication side effects: no Duration of hyperlipidemia:  chronic Cholesterol medication side effects: no Cholesterol supplements: none Medication compliance: good compliance Aspirin: no Recent stressors: no Recurrent headaches: no Visual changes: no Palpitations: occasional rapid HR, coughs and goes away, was taught this in the past Dyspnea: no Chest pain: no Lower extremity edema: no Dizzy/lightheaded: no   ASTHMA Taking Symbicort and Albuterol used only as needed.  Uses inhalers occasionally. Asthma status: stable Satisfied with current treatment?: yes Albuterol/rescue inhaler frequency: once a year Dyspnea frequency: no Wheezing frequency: no Cough frequency: no Nocturnal symptom frequency: no Limitation of activity: no Current upper respiratory symptoms: no Aerochamber/spacer use: no Visits to ER or Urgent Care in past year: no Pneumovax: Up to Date Influenza: Up to Date   TINNITUS Has had since 2007, comes and goes.  But over last few weeks it has been worse and she can not sleep.  Has some Trazodone that is old at home, which she has used in past.  Has never seen ENT for this in past. In morning is stable, but during day it worsens. 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 Interim medical history since our last visit reviewed. Allergies and medications reviewed and  updated.  Review of Systems  Constitutional:  Negative for activity change, appetite change, diaphoresis, fatigue and fever.  HENT:  Positive for tinnitus. Negative for ear discharge and ear pain.   Respiratory:  Negative for cough, chest tightness, shortness of breath and wheezing.   Cardiovascular:  Negative for chest pain, palpitations and leg swelling.  Endocrine: Negative for polydipsia, polyphagia and polyuria.  Neurological: Negative.   Psychiatric/Behavioral: Negative.      Per HPI unless specifically indicated above     Objective:    BP 127/74   Pulse 71   Temp 97.9 F (36.6 C) (Oral)   Ht 5' 2.01" (1.575 m)   Wt 171 lb 9.6 oz (77.8 kg)   LMP 07/13/2000 (Approximate)   SpO2 96%   BMI 31.38 kg/m   Wt Readings from Last 3 Encounters:  02/24/23 171 lb 9.6 oz (77.8 kg)  12/27/22 169 lb (76.7 kg)  12/09/22 164 lb 3.2 oz (74.5 kg)    Physical Exam Vitals and nursing note reviewed.  Constitutional:      General: She is awake. She is not in acute distress.    Appearance: She is well-developed and well-groomed. She is obese. She is not ill-appearing or toxic-appearing.  HENT:     Head: Normocephalic.     Right Ear: Hearing, tympanic membrane, ear canal and external ear normal.     Left Ear: Hearing, tympanic membrane, ear canal and external ear normal.     Ears:     Comments: No cerumen impaction to bilateral ears noted. Eyes:     General: Lids are normal. No scleral icterus.    Pupils: Pupils are equal, round, and reactive to light.  Neck:     Vascular: No carotid bruit.  Cardiovascular:     Rate and Rhythm: Normal rate and regular rhythm.     Pulses: Normal pulses.     Heart sounds: Normal heart sounds. No murmur heard.    No gallop.  Pulmonary:     Effort: Pulmonary effort is normal. No accessory muscle usage or respiratory distress.     Breath sounds: Normal breath sounds.  Abdominal:     General: Bowel sounds are normal. There is no distension.      Palpations: Abdomen is soft.     Tenderness: There is no abdominal tenderness.  Musculoskeletal:     Cervical back: Full passive range of motion without pain.     Right lower leg: No edema.     Left lower leg: No edema.  Skin:    General: Skin is warm and dry.  Neurological:     Mental Status: She is alert.     Deep Tendon Reflexes: Reflexes are normal and symmetric.     Reflex Scores:      Bicep reflexes are 2+ on the right side and 2+ on the  left side.      Brachioradialis reflexes are 2+ on the right side and 2+ on the left side. Psychiatric:        Attention and Perception: Attention normal.        Mood and Affect: Mood normal.        Speech: Speech normal.        Behavior: Behavior is cooperative.        Thought Content: Thought content normal.     Results for orders placed or performed in visit on 12/10/22  TSH  Result Value Ref Range   TSH 1.690 0.450 - 4.500 uIU/mL  Thyroid peroxidase antibody  Result Value Ref Range   Thyroperoxidase Ab SerPl-aCnc <9 0 - 34 IU/mL  T4, free  Result Value Ref Range   Free T4 1.11 0.82 - 1.77 ng/dL  Sodium  Result Value Ref Range   Sodium 140 134 - 144 mmol/L      Assessment & Plan:   Problem List Items Addressed This Visit       Cardiovascular and Mediastinum   Hypertension associated with diabetes (HCC)    Chronic, stable.  BP at goal in office and at home.  Continue Telmisartan for kidney and remain off HCTZ as could diminish effects of insulin == may need to consider Amlodipine if BP trends up in future.  LABS: CMP.  Urine ALB 28 November 2022.  Recommend she continue to check BP at home on occasion and document for visits. Focus on DASH diet.  Return in 3 months.      Relevant Medications   glipiZIDE (GLUCOTROL) 5 MG tablet   Other Relevant Orders   Bayer DCA Hb A1c Waived   Comprehensive metabolic panel     Respiratory   Asthma, intermittent    Chronic, stable.  Continue current medication regimen and adjust as needed.   Will send refills when requested as minimally uses inhalers.      OSA (obstructive sleep apnea)    Chronic, does not consistently wear CPAP.  Recommend 100% use.        Digestive   Fatty liver    Chronic, noted on past imaging. At this time recommend heavy focus on diet and regular activity + working on diabetes control with goal A1c <7%.      Relevant Orders   Comprehensive metabolic panel     Endocrine   Hyperlipidemia associated with type 2 diabetes mellitus (HCC)    Chronic, ongoing.  Will continue Rosuvastatin daily and adjust dose as needed.  Lipid panel today.        Relevant Medications   glipiZIDE (GLUCOTROL) 5 MG tablet   Other Relevant Orders   Bayer DCA Hb A1c Waived   Comprehensive metabolic panel   Lipid Panel w/o Chol/HDL Ratio   Insulin dependent type 2 diabetes mellitus (HCC) - Primary    Chronic, ongoing.  Urine ALB 28 November 2022.  A1c 5.7% today which is trended down from previous 6.7%, pancreatitis presented with Mounjaro & Ozempic.  Had tolerated Ozempic well for years.  Uses Dexcom. - At this time will maintain off GLP1 or Mounjaro due to pancreatitis in past. - Continue Jardiance and Metformin. Continue Levemir 40 units, reduce this if morning BS consistently less then 130 or lows <70 present.  Continue Novolog pre-meals 5-8 units as needed only (not to take consistently), educated her on this and not to take if does not eat a meal or take after meals. - Dexcom continues.  Continue  to work with CCM. - Check blood sugar 4-5 times a day and document for provider.   - Eye and foot exam up to date. - Statin and ARB on board. - Flu shot and Pneumonia vaccines up to date. Return in 3 months.      Relevant Medications   glipiZIDE (GLUCOTROL) 5 MG tablet   Other Relevant Orders   Bayer DCA Hb A1c Waived     Other   Obesity    BMI 31.38.  Recommended eating smaller high protein, low fat meals more frequently and exercising 30 mins a day 5 times a week with  a goal of 10-15lb weight loss in the next 3 months. Patient voiced their understanding and motivation to adhere to these recommendations.       Relevant Medications   glipiZIDE (GLUCOTROL) 5 MG tablet   Tinnitus of both ears    Overall exam stable.  No cerumen present.  Will place referral to ENT for further assessment due to chronic nature of this, suspect some hearing loss presenting.  Trazodone sent in to help with sleep while having issues with tinnitus.      Relevant Orders   Ambulatory referral to ENT     Follow up plan: Return in about 4 weeks (around 03/24/2023) for Tinnitus.

## 2023-02-24 NOTE — Assessment & Plan Note (Signed)
Chronic, ongoing.  Will continue Rosuvastatin daily and adjust dose as needed.  Lipid panel today.   

## 2023-02-24 NOTE — Assessment & Plan Note (Signed)
Chronic, ongoing.  Urine ALB 28 November 2022.  A1c 5.7% today which is trended down from previous 6.7%, pancreatitis presented with Mounjaro & Ozempic.  Had tolerated Ozempic well for years.  Uses Dexcom. - At this time will maintain off GLP1 or Mounjaro due to pancreatitis in past. - Continue Jardiance and Metformin. Continue Levemir 40 units, reduce this if morning BS consistently less then 130 or lows <70 present.  Continue Novolog pre-meals 5-8 units as needed only (not to take consistently), educated her on this and not to take if does not eat a meal or take after meals. - Dexcom continues.  Continue to work with CCM. - Check blood sugar 4-5 times a day and document for provider.   - Eye and foot exam up to date. - Statin and ARB on board. - Flu shot and Pneumonia vaccines up to date. Return in 3 months.

## 2023-02-24 NOTE — Assessment & Plan Note (Signed)
Chronic, stable.  Continue current medication regimen and adjust as needed.  Will send refills when requested as minimally uses inhalers. 

## 2023-02-24 NOTE — Assessment & Plan Note (Signed)
BMI 31.38.  Recommended eating smaller high protein, low fat meals more frequently and exercising 30 mins a day 5 times a week with a goal of 10-15lb weight loss in the next 3 months. Patient voiced their understanding and motivation to adhere to these recommendations.  

## 2023-02-25 ENCOUNTER — Telehealth: Payer: Self-pay

## 2023-02-25 LAB — LIPID PANEL W/O CHOL/HDL RATIO
Cholesterol, Total: 141 mg/dL (ref 100–199)
HDL: 72 mg/dL (ref >39)
LDL Chol Calc (NIH): 53 mg/dL (ref 0–99)
Triglycerides: 84 mg/dL (ref 0–149)
VLDL Cholesterol Cal: 16 mg/dL (ref 5–40)

## 2023-02-25 LAB — COMPREHENSIVE METABOLIC PANEL
ALT: 58 IU/L — ABNORMAL HIGH (ref 0–32)
AST: 42 IU/L — ABNORMAL HIGH (ref 0–40)
Albumin/Globulin Ratio: 2 (ref 1.2–2.2)
Albumin: 4.5 g/dL (ref 3.8–4.8)
Alkaline Phosphatase: 61 IU/L (ref 44–121)
BUN/Creatinine Ratio: 25 (ref 12–28)
BUN: 19 mg/dL (ref 8–27)
Bilirubin Total: 0.3 mg/dL (ref 0.0–1.2)
CO2: 23 mmol/L (ref 20–29)
Calcium: 9.5 mg/dL (ref 8.7–10.3)
Chloride: 100 mmol/L (ref 96–106)
Creatinine, Ser: 0.77 mg/dL (ref 0.57–1.00)
Globulin, Total: 2.2 g/dL (ref 1.5–4.5)
Glucose: 99 mg/dL (ref 70–99)
Potassium: 4.3 mmol/L (ref 3.5–5.2)
Sodium: 138 mmol/L (ref 134–144)
Total Protein: 6.7 g/dL (ref 6.0–8.5)
eGFR: 82 mL/min/{1.73_m2} (ref >59)

## 2023-02-25 NOTE — Progress Notes (Signed)
Contacted via MyChart   Good morning Lurena, your labs have returned: - Kidney function, creatinine and eGFR, remains normal.  Liver function, AST and ALT, show some mild elevation which we will continue to monitor -- although much improved since stopping the Ozempic and Mounjaro.   - Cholesterol levels are at goal.  Continue all current medications.  Any questions? Keep being stellar!!  Thank you for allowing me to participate in your care.  I appreciate you. Kindest regards, Bentli Llorente

## 2023-02-25 NOTE — Progress Notes (Cosign Needed)
Care Management & Coordination Services Pharmacy Team  Reason for Encounter: Appointment Reminder  Contacted patient to confirm telephone appointment with Karyl Kinnier, PharmD on 02/28/23 at 3:30pm. Unsuccessful outreach. Left voicemail for patient to return call.  Rance Muir, RMA

## 2023-02-28 ENCOUNTER — Ambulatory Visit: Payer: Medicare Other

## 2023-02-28 ENCOUNTER — Telehealth: Payer: Self-pay

## 2023-02-28 NOTE — Telephone Encounter (Cosign Needed)
Patient called back but unable to do full visit. States she is doing well and visiting her daughter next week. No lows per patient.

## 2023-02-28 NOTE — Patient Outreach (Signed)
  Care Management   Follow Up Note   02/28/2023 Name: Charlene Juarez MRN: [id number] DOB: November 17, 1950   Referred by: Marjie Skiff, NP Reason for referral : Care Coordination   An unsuccessful telephone outreach was attempted today. The patient was referred to the case management team for assistance with care management and care coordination.   Follow Up Plan: The patient has been provided with contact information for the care management team and has been advised to call with any health related questions or concerns.   Artelia Laroche, Pharm.D. - [phone]

## 2023-03-05 ENCOUNTER — Encounter: Payer: Self-pay | Admitting: Nurse Practitioner

## 2023-03-08 ENCOUNTER — Encounter: Payer: Self-pay | Admitting: Nurse Practitioner

## 2023-03-13 ENCOUNTER — Other Ambulatory Visit: Payer: Self-pay | Admitting: Nurse Practitioner

## 2023-03-14 NOTE — Telephone Encounter (Signed)
Requested Prescriptions  Pending Prescriptions Disp Refills   MOUNJARO 7.5 MG/0.5ML Pen [Pharmacy Med Name: MOUNJARO 7.5 MG/0.5 ML PEN]  4    Sig: INJECT 7.5 MG SUBCUTANEOUSLY WEEKLY     Off-Protocol Failed - 03/13/2023  8:34 AM      Failed - Medication not assigned to a protocol, review manually.      Passed - Valid encounter within last 12 months    Recent Outpatient Visits           2 weeks ago Insulin dependent type 2 diabetes mellitus (HCC)   [place] [facility] [place], Corrie Dandy T, NP   3 months ago Medicare annual wellness visit, subsequent   [facility] [place], [place] T, NP   5 months ago Insulin dependent type 2 diabetes mellitus (HCC)   [place] [facility] [place], [place] T, NP   6 months ago Type 2 diabetes mellitus with obesity (HCC)   [facility] [place], [place] T, NP   7 months ago Type 2 diabetes mellitus with obesity (HCC)   [facility] [place], Dorie Rank, NP       Future Appointments             In 1 week Cannady, Dorie Rank, NP  [facility]

## 2023-03-16 NOTE — Telephone Encounter (Signed)
Contacted patient and advised that the duplicate AWV performed n April was an error. I also advised that a request was sent to the billing dept to have the $295 charge adjusted. Pt acknowledged understanding.

## 2023-03-18 ENCOUNTER — Ambulatory Visit (INDEPENDENT_AMBULATORY_CARE_PROVIDER_SITE_OTHER): Payer: Medicare Other

## 2023-03-18 ENCOUNTER — Telehealth: Payer: Self-pay | Admitting: Nurse Practitioner

## 2023-03-18 DIAGNOSIS — H9313 Tinnitus, bilateral: Secondary | ICD-10-CM

## 2023-03-18 DIAGNOSIS — E1159 Type 2 diabetes mellitus with other circulatory complications: Secondary | ICD-10-CM

## 2023-03-18 DIAGNOSIS — I152 Hypertension secondary to endocrine disorders: Secondary | ICD-10-CM

## 2023-03-18 DIAGNOSIS — E1169 Type 2 diabetes mellitus with other specified complication: Secondary | ICD-10-CM

## 2023-03-18 DIAGNOSIS — E669 Obesity, unspecified: Secondary | ICD-10-CM

## 2023-03-18 NOTE — Patient Instructions (Addendum)
Error in charting - duplicate ?

## 2023-03-18 NOTE — Chronic Care Management (AMB) (Signed)
Chronic Care Management   CCM RN Visit Note  03/18/2023 Name: Charlene Juarez MRN: [id number] DOB: Jan 15, 1951  Subjective: Charlene Juarez is a 72 y.o. year old female who is a primary care patient of Cannady, Dorie Rank, NP. The patient was referred to the Chronic Care Management team for assistance with care management needs subsequent to provider initiation of CCM services and plan of care.    Today's Visit:  Engaged with patient by telephone for follow up visit.        Goals Addressed             This Visit's Progress    CCM Expected Outcome:  Monitor, Self-Manage and Reduce Symptoms of Diabetes       Current Barriers:  Knowledge Deficits related to diabetes management education and benefits of controlled DM  Care Coordination needs related to denial of Dexcom and reordering of Dexcom meter, may need pharm D support in the future in a patient with DM  Chronic Disease Management support and education needs related to effective management of DM Lab Results  Component Value Date   HGBA1C 5.7 (H) 02/24/2023    Planned Interventions: Provided education to patient about basic DM disease process. The patient is doing well with the management of her DM. Her most recent A1C lower than previous and she is doing well with the current plan of care. She saw the pcp in early part of June and will see again in July. Denies any acute findings related to her DM. Is not having good response from trazodone and ask about something else to help with sleep. OTC products do not help with insomnia.  In basket message sent to the pcp for recommendations. Will monitor and call patient back with any recommendations from the pcp. Call made back to the patient after collaboration with the pcp and the patient will talk to the pcp about medication changes for sleep help at upcoming visit in July. The patient verbalized understanding.  Reviewed medications with patient and discussed importance of  medication adherence. The patient is compliant with her medications. She is on the Jardiance,  Metformin,  and Levimir for effective management of her DM. The patient cannot tolerate Mounjaro or Ozempic. The patient is doing well with the current plan of care. A1C is at target.  Reviewed prescribed diet with patient heart healthy/ADA diet. Discussed making sure she eats a light snack at bedtime to help with regulation of sugars during sleep times. She is eating snacks at bedtime and this is helping keep her blood sugars stable during the night; Counseled on importance of regular laboratory monitoring as prescribed. The patient is under goal with A1C and is thankful for this. Review of maintaining good control of her blood sugars.    Discussed plans with patient for ongoing care management follow up and provided patient with direct contact information for care management team;      Provided patient with written educational materials related to hypo and hyperglycemia and importance of correct treatment. She is using the Dexcom. The patient did not have long to talk today and did not provide numbers for the [facility]. The patient knows to call for any acute changes in her blood glucose readings. The patient knows how to effectively manage her hypo and hyperglycemia. Recommendations provided.     Reviewed scheduled/upcoming provider appointments including: 04-12-2023 with the pcp.        Advised patient, providing education and rationale, to check cbg when you have  symptoms of low or high blood sugar and has a continuous glucose reader at this time  and record. The patient has her Dexcom G-7 and it is doing well for her. She is happy she has that.  call provider for findings outside established parameters;       Review of patient status, including review of consultants reports, relevant laboratory and other test results, and medications completed;       Advised patient to discuss changes in DM health and well being  with provider;      Screening for signs and symptoms of depression related to chronic disease state;        Assessed social determinant of health barriers;    The patient is having some tinnitus in her ears that she has noticed more recently. She has a sound machine and that helps a lot most of the time but not all of the time. Will send information to the patient about the Epley maneuver to see if this possibly may help with her tinnitus she is experiencing. She states this has been going on for a while. Denies any safety concerns. She is having issues with sleep patterns at times with the tinnitus. States melatonin and other OTC products  are not helpful. The patient states that the pcp does not want to give her Ambien. Discussed the risk of Ambien. The patient has Trazadone but she states this is not helpful for her. An inbasket message sent to the pcp for recommendations. The patient is open for recommendations on helping with her insomnia. Also she has a referral in place for ENT for tinnitus. That appointment is April 04, 2023 with ENT.   Symptom Management: Take medications as prescribed   Attend all scheduled provider appointments Call provider office for new concerns or questions  call the Suicide and Crisis Lifeline: 988 call the [place] National Suicide Prevention Lifeline: 682-288-2166 or TTY: 226-692-0705 TTY 816-372-5729) to talk to a trained counselor call 1-800-273-TALK (toll free, 24 hour hotline) if experiencing a Mental Health or Behavioral Health Crisis  keep appointment with eye doctor check feet daily for cuts, sores or redness trim toenails straight across manage portion size wash and dry feet carefully every day wear comfortable, cotton socks wear comfortable, well-fitting shoes  Follow Up Plan: Telephone follow up appointment with care management team member scheduled for: 06-08-2023 at 9am          Plan:Telephone follow up appointment with care management team  member scheduled for:  06-08-2023 at 0900 am  Alto Denver RN, MSN, CCM RN Care Manager  Chronic Care Management Direct Number: [phone]

## 2023-03-18 NOTE — Patient Instructions (Addendum)
Please call the care guide team at [phone] if you need to cancel or reschedule your appointment.   If you are experiencing a Mental Health or Behavioral Health Crisis or need someone to talk to, please call the Suicide and Crisis Lifeline: 988 call the [place] National Suicide Prevention Lifeline: 985-565-7055 or TTY: (306) 442-0775 TTY 480-603-6813) to talk to a trained counselor call 1-800-273-TALK (toll free, 24 hour hotline)   Following is a copy of the CCM Program Consent:  CCM service includes personalized support from designated clinical staff supervised by the physician, including individualized plan of care and coordination with other care providers 24/7 contact phone numbers for assistance for urgent and routine care needs. Service will only be billed when office clinical staff spend 20 minutes or more in a month to coordinate care. Only one practitioner may furnish and bill the service in a calendar month. The patient may stop CCM services at amy time (effective at the end of the month) by phone call to the office staff. The patient will be responsible for cost sharing (co-pay) or up to 20% of the service fee (after annual deductible is met)  Following is a copy of your full provider care plan:   Goals Addressed             This Visit's Progress    CCM Expected Outcome:  Monitor, Self-Manage and Reduce Symptoms of Diabetes       Current Barriers:  Knowledge Deficits related to diabetes management education and benefits of controlled DM  Care Coordination needs related to denial of Dexcom and reordering of Dexcom meter, may need pharm D support in the future in a patient with DM  Chronic Disease Management support and education needs related to effective management of DM Lab Results  Component Value Date   HGBA1C 5.7 (H) 02/24/2023    Planned Interventions: Provided education to patient about basic DM disease process. The patient is doing well with the management of her  DM. Her most recent A1C lower than previous and she is doing well with the current plan of care. She saw the pcp in early part of June and will see again in July. Denies any acute findings related to her DM. Is not having good response from trazodone and ask about something else to help with sleep. OTC products do not help with insomnia.  In basket message sent to the pcp for recommendations. Will monitor and call patient back with any recommendations from the pcp. Call made back to the patient after collaboration with the pcp and the patient will talk to the pcp about medication changes for sleep help at upcoming visit in July. The patient verbalized understanding.  Reviewed medications with patient and discussed importance of medication adherence. The patient is compliant with her medications. She is on the Jardiance,  Metformin,  and Levimir for effective management of her DM. The patient cannot tolerate Mounjaro or Ozempic. The patient is doing well with the current plan of care. A1C is at target.  Reviewed prescribed diet with patient heart healthy/ADA diet. Discussed making sure she eats a light snack at bedtime to help with regulation of sugars during sleep times. She is eating snacks at bedtime and this is helping keep her blood sugars stable during the night; Counseled on importance of regular laboratory monitoring as prescribed. The patient is under goal with A1C and is thankful for this. Review of maintaining good control of her blood sugars.    Discussed plans with patient for  ongoing care management follow up and provided patient with direct contact information for care management team;      Provided patient with written educational materials related to hypo and hyperglycemia and importance of correct treatment. She is using the Dexcom. The patient did not have long to talk today and did not provide numbers for the [facility]. The patient knows to call for any acute changes in her blood glucose readings.  The patient knows how to effectively manage her hypo and hyperglycemia. Recommendations provided.     Reviewed scheduled/upcoming provider appointments including: 04-12-2023 with the pcp.        Advised patient, providing education and rationale, to check cbg when you have symptoms of low or high blood sugar and has a continuous glucose reader at this time  and record. The patient has her Dexcom G-7 and it is doing well for her. She is happy she has that.  call provider for findings outside established parameters;       Review of patient status, including review of consultants reports, relevant laboratory and other test results, and medications completed;       Advised patient to discuss changes in DM health and well being with provider;      Screening for signs and symptoms of depression related to chronic disease state;        Assessed social determinant of health barriers;    The patient is having some tinnitus in her ears that she has noticed more recently. She has a sound machine and that helps a lot most of the time but not all of the time. Will send information to the patient about the Epley maneuver to see if this possibly may help with her tinnitus she is experiencing. She states this has been going on for a while. Denies any safety concerns. She is having issues with sleep patterns at times with the tinnitus. States melatonin and other OTC products  are not helpful. The patient states that the pcp does not want to give her Ambien. Discussed the risk of Ambien. The patient has Trazadone but she states this is not helpful for her. An inbasket message sent to the pcp for recommendations. The patient is open for recommendations on helping with her insomnia. Also she has a referral in place for ENT for tinnitus. That appointment is April 04, 2023 with ENT.   Symptom Management: Take medications as prescribed   Attend all scheduled provider appointments Call provider office for new concerns or  questions  call the Suicide and Crisis Lifeline: 988 call the [place] National Suicide Prevention Lifeline: (438) 505-6850 or TTY: 781 207 5149 TTY 647-224-6409) to talk to a trained counselor call 1-800-273-TALK (toll free, 24 hour hotline) if experiencing a Mental Health or Behavioral Health Crisis  keep appointment with eye doctor check feet daily for cuts, sores or redness trim toenails straight across manage portion size wash and dry feet carefully every day wear comfortable, cotton socks wear comfortable, well-fitting shoes  Follow Up Plan: Telephone follow up appointment with care management team member scheduled for: 06-08-2023 at 9am          Patient verbalizes understanding of instructions and care plan provided today and agrees to view in MyChart. Active MyChart status and patient understanding of how to access instructions and care plan via MyChart confirmed with patient.  Telephone follow up appointment with care management team member scheduled for: 06-08-2023 at 0900 am Insomnia Insomnia is a sleep disorder that makes it difficult to fall asleep or  stay asleep. Insomnia can cause fatigue, low energy, difficulty concentrating, mood swings, and poor performance at work or school. There are three different ways to classify insomnia: Difficulty falling asleep. Difficulty staying asleep. Waking up too early in the morning. Any type of insomnia can be long-term (chronic) or short-term (acute). Both are common. Short-term insomnia usually lasts for 3 months or less. Chronic insomnia occurs at least three times a week for longer than 3 months. What are the causes? Insomnia may be caused by another condition, situation, or substance, such as: Having certain mental health conditions, such as anxiety and depression. Using caffeine, alcohol, tobacco, or drugs. Having gastrointestinal conditions, such as gastroesophageal reflux disease (GERD). Having certain medical conditions. These  include: Asthma. Alzheimer's disease. Stroke. Chronic pain. An overactive thyroid gland (hyperthyroidism). Other sleep disorders, such as restless legs syndrome and sleep apnea. Menopause. Sometimes, the cause of insomnia may not be known. What increases the risk? Risk factors for insomnia include: Gender. Females are affected more often than males. Age. Insomnia is more common as people get older. Stress and certain medical and mental health conditions. Lack of exercise. Having an irregular work schedule. This may include working night shifts and traveling between different time zones. What are the signs or symptoms? If you have insomnia, the main symptom is having trouble falling asleep or having trouble staying asleep. This may lead to other symptoms, such as: Feeling tired or having low energy. Feeling nervous about going to sleep. Not feeling rested in the morning. Having trouble concentrating. Feeling irritable, anxious, or depressed. How is this diagnosed? This condition may be diagnosed based on: Your symptoms and medical history. Your health care provider may ask about: Your sleep habits. Any medical conditions you have. Your mental health. A physical exam. How is this treated? Treatment for insomnia depends on the cause. Treatment may focus on treating an underlying condition that is causing the insomnia. Treatment may also include: Medicines to help you sleep. Counseling or therapy. Lifestyle adjustments to help you sleep better. Follow these instructions at home: Eating and drinking  Limit or avoid alcohol, caffeinated beverages, and products that contain nicotine and tobacco, especially close to bedtime. These can disrupt your sleep. Do not eat a large meal or eat spicy foods right before bedtime. This can lead to digestive discomfort that can make it hard for you to sleep. Sleep habits  Keep a sleep diary to help you and your health care provider figure out  what could be causing your insomnia. Write down: When you sleep. When you wake up during the night. How well you sleep and how rested you feel the next day. Any side effects of medicines you are taking. What you eat and drink. Make your bedroom a dark, comfortable place where it is easy to fall asleep. Put up shades or blackout curtains to block light from outside. Use a white noise machine to block noise. Keep the temperature cool. Limit screen use before bedtime. This includes: Not watching TV. Not using your smartphone, tablet, or computer. Stick to a routine that includes going to bed and waking up at the same times every day and night. This can help you fall asleep faster. Consider making a quiet activity, such as reading, part of your nighttime routine. Try to avoid taking naps during the day so that you sleep better at night. Get out of bed if you are still awake after 15 minutes of trying to sleep. Keep the lights down, but try reading  or doing a quiet activity. When you feel sleepy, go back to bed. General instructions Take over-the-counter and prescription medicines only as told by your health care provider. Exercise regularly as told by your health care provider. However, avoid exercising in the hours right before bedtime. Use relaxation techniques to manage stress. Ask your health care provider to suggest some techniques that may work well for you. These may include: Breathing exercises. Routines to release muscle tension. Visualizing peaceful scenes. Make sure that you drive carefully. Do not drive if you feel very sleepy. Keep all follow-up visits. This is important. Contact a health care provider if: You are tired throughout the day. You have trouble in your daily routine due to sleepiness. You continue to have sleep problems, or your sleep problems get worse. Get help right away if: You have thoughts about hurting yourself or someone else. Get help right away if you  feel like you may hurt yourself or others, or have thoughts about taking your own life. Go to your nearest emergency room or: Call 911. Call the National Suicide Prevention Lifeline at 254-681-8799 or 988. This is open 24 hours a day. Text the Crisis Text Line at 8087920554. Summary Insomnia is a sleep disorder that makes it difficult to fall asleep or stay asleep. Insomnia can be long-term (chronic) or short-term (acute). Treatment for insomnia depends on the cause. Treatment may focus on treating an underlying condition that is causing the insomnia. Keep a sleep diary to help you and your health care provider figure out what could be causing your insomnia. This information is not intended to replace advice given to you by your health care provider. Make sure you discuss any questions you have with your health care provider. Document Revised: 08/17/2021 Document Reviewed: 08/17/2021 Elsevier Patient Education  2024 [employer]. Healthy Living: Sleep In this video, you will learn why sleep is an important part of a healthy lifestyle. To view the content, go to this web address: [email or web address]  This video will expire on: 11/17/2024. If you need access to this video following this date, please reach out to the healthcare provider who assigned it to you. This information is not intended to replace advice given to you by your health care provider. Make sure you discuss any questions you have with your health care provider. Elsevier Patient Education  2024 [employer].

## 2023-03-18 NOTE — Telephone Encounter (Unsigned)
Copied from CRM [id number]. Topic: General - Inquiry >> Mar 18, 2023 10:36 AM De Blanch wrote: Reason for CRM: Pt asked if an appointment with care coordinators is mandatory, as she has things to do and does not have time to sit around and wait for people to call her.  Please advise.

## 2023-03-18 NOTE — Chronic Care Management (AMB) (Deleted)
Chronic Care Management   CCM RN Visit Note  03/18/2023 Name: Charlene Juarez MRN: [id number] DOB: June 15, 1951  Subjective: Charlene Juarez is a 72 y.o. year old female who is a primary care patient of Cannady, Dorie Rank, NP. The patient was referred to the Chronic Care Management team for assistance with care management needs subsequent to provider initiation of CCM services and plan of care.    Today's Visit:  Engaged with patient by telephone for follow up visit.        Goals Addressed             This Visit's Progress    CCM Expected Outcome:  Monitor, Self-Manage and Reduce Symptoms of Diabetes       Current Barriers:  Knowledge Deficits related to diabetes management education and benefits of controlled DM  Care Coordination needs related to denial of Dexcom and reordering of Dexcom meter, may need pharm D support in the future in a patient with DM  Chronic Disease Management support and education needs related to effective management of DM Lab Results  Component Value Date   HGBA1C 5.7 (H) 02/24/2023    Planned Interventions: Provided education to patient about basic DM disease process. The patient is doing well with the management of her DM. Her most recent A1C lower than previous and she is doing well with the current plan of care. She saw the pcp in early part of June and will see again in July. Denies any acute findings related to her DM. Is not having good response from trazodone and ask about something else to help with sleep. OTC products do not help with insomnia.  In basket message sent to the pcp for recommendations. Will monitor and call patient back with any recommendations from the pcp. Call made back to the patient after collaboration with the pcp and the patient will talk to the pcp about medication changes for sleep help at upcoming visit in July. The patient verbalized understanding.  Reviewed medications with patient and discussed importance of  medication adherence. The patient is compliant with her medications. She is on the Jardiance,  Metformin,  and Levimir for effective management of her DM. The patient cannot tolerate Mounjaro or Ozempic. The patient is doing well with the current plan of care. A1C is at target.  Reviewed prescribed diet with patient heart healthy/ADA diet. Discussed making sure she eats a light snack at bedtime to help with regulation of sugars during sleep times. She is eating snacks at bedtime and this is helping keep her blood sugars stable during the night; Counseled on importance of regular laboratory monitoring as prescribed. The patient is under goal with A1C and is thankful for this. Review of maintaining good control of her blood sugars.    Discussed plans with patient for ongoing care management follow up and provided patient with direct contact information for care management team;      Provided patient with written educational materials related to hypo and hyperglycemia and importance of correct treatment. She is using the Dexcom. The patient did not have long to talk today and did not provide numbers for the [facility]. The patient knows to call for any acute changes in her blood glucose readings. The patient knows how to effectively manage her hypo and hyperglycemia. Recommendations provided.     Reviewed scheduled/upcoming provider appointments including: 04-12-2023 with the pcp.        Advised patient, providing education and rationale, to check cbg when you have  symptoms of low or high blood sugar and has a continuous glucose reader at this time  and record. The patient has her Dexcom G-7 and it is doing well for her. She is happy she has that.  call provider for findings outside established parameters;       Review of patient status, including review of consultants reports, relevant laboratory and other test results, and medications completed;       Advised patient to discuss changes in DM health and well being  with provider;      Screening for signs and symptoms of depression related to chronic disease state;        Assessed social determinant of health barriers;    The patient is having some tinnitus in her ears that she has noticed more recently. She has a sound machine and that helps a lot most of the time but not all of the time. Will send information to the patient about the Epley maneuver to see if this possibly may help with her tinnitus she is experiencing. She states this has been going on for a while. Denies any safety concerns. She is having issues with sleep patterns at times with the tinnitus. States melatonin and other OTC products  are not helpful. The patient states that the pcp does not want to give her Ambien. Discussed the risk of Ambien. The patient has Trazadone but she states this is not helpful for her. An inbasket message sent to the pcp for recommendations. The patient is open for recommendations on helping with her insomnia. Also she has a referral in place for ENT for tinnitus. That appointment is April 04, 2023 with ENT.   Symptom Management: Take medications as prescribed   Attend all scheduled provider appointments Call provider office for new concerns or questions  call the Suicide and Crisis Lifeline: 988 call the [place] National Suicide Prevention Lifeline: 256-164-2757 or TTY: (513)545-4894 TTY 907-475-4479) to talk to a trained counselor call 1-800-273-TALK (toll free, 24 hour hotline) if experiencing a Mental Health or Behavioral Health Crisis  keep appointment with eye doctor check feet daily for cuts, sores or redness trim toenails straight across manage portion size wash and dry feet carefully every day wear comfortable, cotton socks wear comfortable, well-fitting shoes  Follow Up Plan: Telephone follow up appointment with care management team member scheduled for: 06-08-2023 at 9am       CCM Expected Outcome:  Monitor, Self-Manage and Reduce Symptoms of: HLD        Current Barriers:  Chronic Disease Management support and education needs related to for effective management of HLD Lab Results  Component Value Date   CHOL 141 02/24/2023   HDL 72 02/24/2023   LDLCALC 53 02/24/2023   TRIG 84 02/24/2023     Planned Interventions: Provider established cholesterol goals reviewed. The patient is at goal and doing well with management of her HLD. Education and support given. ; Counseled on importance of regular laboratory monitoring as prescribed. Has labs on a regular basis.; Provided HLD educational materials; Reviewed role and benefits of statin for ASCVD risk reduction; Discussed strategies to manage statin-induced myalgias; Reviewed importance of limiting foods high in cholesterol; Reviewed exercise goals and target of 150 minutes per week. Is active in her garden and busy during the summer with her family. ; Screening for signs and symptoms of depression related to chronic disease state;  Assessed social determinant of health barriers;   Symptom Management: Take medications as prescribed   Attend  all scheduled provider appointments Call provider office for new concerns or questions  call the Suicide and Crisis Lifeline: 988 call the [place] National Suicide Prevention Lifeline: 909-123-8690 or TTY: (818) 223-5817 TTY 603-810-5656) to talk to a trained counselor call 1-800-273-TALK (toll free, 24 hour hotline) if experiencing a Mental Health or Behavioral Health Crisis  - call for medicine refill 2 or 3 days before it runs out - take all medications exactly as prescribed - call doctor with any symptoms you believe are related to your medicine - call doctor when you experience any new symptoms - go to all doctor appointments as scheduled - adhere to prescribed diet: heart healthy/ADA diet  - develop an exercise routine  Follow Up Plan: Telephone follow up appointment with care management team member scheduled for: 06-08-2023 at 0900 am        CCM Expected Outcome:  Monitor, Self-Manage, and Reduce Symptoms of Hypertension       Current Barriers:  Knowledge Deficits related to the benefits for taking blood pressures on a regular basis and recording Chronic Disease Management support and education needs related to effective management of HTN BP Readings from Last 3 Encounters:  02/24/23 127/74  12/09/22 123/74  11/24/22 127/74     Planned Interventions: Evaluation of current treatment plan related to hypertension self management and patient's adherence to plan as established by provider. The patient is doing well with management of her blood pressures and her blood pressures are stable at this time.  Denies any acute changes in her HTN or heart health. Blood pressures are stable;   Provided education to patient re: stroke prevention, s/s of heart attack and stroke. Education and support; Reviewed prescribed diet heart healthy/ADA diet. The patient is mindful of her dietary intake. The patient watches what she eats. She is compliant with dietary restrictions.   Reviewed medications with patient and discussed importance of compliance. The patient is compliant with medications. Review of pharm D support and to let the [facility] know if she needs support from pharm D.   Counseled on the importance of exercise goals with target of 150 minutes per week. The patient is active and loves to garden and do arrangements with flowers. Education on walking at least 15 minutes at a moderate pace can improve blood sugars, help with heart function and circulation. Encouraged increased activity.  Discussed plans with patient for ongoing care management follow up and provided patient with direct contact information for care management team; Advised patient, providing education and rationale, to monitor blood pressure daily and record, calling PCP for findings outside established parameters. The patient normally does not take blood pressures at home. Has a cuff.  Education and support given on the benefits of checking blood pressures on a regular basis;  Reviewed scheduled/upcoming provider appointments including: 04-12-2023 at 0840 am with the pcp Advised patient to discuss changes in her blood pressures and heart health with provider, reminder provided today; Provided education on prescribed diet heart healthy/ADA diet. Talked about eating smaller more frequent meals to keep her chronic conditions stable ;  Discussed complications of poorly controlled blood pressure such as heart disease, stroke, circulatory complications, vision complications, kidney impairment, sexual dysfunction;  Screening for signs and symptoms of depression related to chronic disease state;  Assessed social determinant of health barriers;   Symptom Management: Take medications as prescribed   Attend all scheduled provider appointments Call provider office for new concerns or questions  call the Suicide and Crisis Lifeline: 988 call the [place]  National Suicide Prevention Lifeline: 313-397-8543 or TTY: (952)672-0108 TTY (364) 273-5319) to talk to a trained counselor call 1-800-273-TALK (toll free, 24 hour hotline) if experiencing a Mental Health or Behavioral Health Crisis  check blood pressure weekly learn about high blood pressure call doctor for signs and symptoms of high blood pressure develop an action plan for high blood pressure keep all doctor appointments take medications for blood pressure exactly as prescribed begin an exercise program report new symptoms to your doctor  Follow Up Plan: Telephone follow up appointment with care management team member scheduled for: 06-08-2023 at 0900 am          Plan:Telephone follow up appointment with care management team member scheduled for:  06-08-2023 at 0900 am  Alto [place] RN, MSN, CCM RN Care Manager  Chronic Care Management Direct Number: [phone]

## 2023-03-18 NOTE — Chronic Care Management (AMB) (Signed)
   03/18/2023  Charlene Juarez 1951-03-01 [id number]   Error- delete

## 2023-03-20 DIAGNOSIS — I152 Hypertension secondary to endocrine disorders: Secondary | ICD-10-CM

## 2023-03-20 DIAGNOSIS — E669 Obesity, unspecified: Secondary | ICD-10-CM

## 2023-03-20 DIAGNOSIS — E1169 Type 2 diabetes mellitus with other specified complication: Secondary | ICD-10-CM

## 2023-03-20 DIAGNOSIS — E1159 Type 2 diabetes mellitus with other circulatory complications: Secondary | ICD-10-CM

## 2023-03-20 DIAGNOSIS — E785 Hyperlipidemia, unspecified: Secondary | ICD-10-CM

## 2023-03-23 ENCOUNTER — Ambulatory Visit: Payer: Medicare Other | Admitting: Nurse Practitioner

## 2023-04-03 ENCOUNTER — Other Ambulatory Visit: Payer: Self-pay | Admitting: Nurse Practitioner

## 2023-04-04 DIAGNOSIS — H90A22 Sensorineural hearing loss, unilateral, left ear, with restricted hearing on the contralateral side: Secondary | ICD-10-CM | POA: Diagnosis not present

## 2023-04-04 DIAGNOSIS — H9313 Tinnitus, bilateral: Secondary | ICD-10-CM | POA: Diagnosis not present

## 2023-04-04 DIAGNOSIS — H7403 Tympanosclerosis, bilateral: Secondary | ICD-10-CM | POA: Diagnosis not present

## 2023-04-04 NOTE — Telephone Encounter (Signed)
Requested medication (s) are due for refill today: No  Requested medication (s) are on the active medication list: Yes  Last refill:  02/24/23  Future visit scheduled: Yes  Notes to clinic:  Pharmacy request 90 day supply     Requested Prescriptions  Pending Prescriptions Disp Refills   traZODone (DESYREL) 50 MG tablet [Pharmacy Med Name: TRAZODONE 50 MG TABLET] 180 tablet 3    Sig: TAKE 1-2 TABLETS BY MOUTH AT BEDTIME AS NEEDED FOR SLEEP.     Psychiatry: Antidepressants - Serotonin Modulator Passed - 04/03/2023  2:32 PM      Passed - Valid encounter within last 6 months    Recent Outpatient Visits           1 month ago Insulin dependent type 2 diabetes mellitus (HCC)   [place] [facility] [place], Corrie Dandy T, NP   4 months ago Medicare annual wellness visit, subsequent   [place] [facility] [place], [place] T, NP   6 months ago Insulin dependent type 2 diabetes mellitus (HCC)   [place] [facility] [place], [place] T, NP   7 months ago Type 2 diabetes mellitus with obesity (HCC)   [place] [facility] [place], [place] T, NP   8 months ago Type 2 diabetes mellitus with obesity (HCC)   [place] [facility] [place], Dorie Rank, NP       Future Appointments             In 1 week Cannady, Dorie Rank, NP [place] [facility]

## 2023-04-05 ENCOUNTER — Other Ambulatory Visit: Payer: Self-pay | Admitting: Otolaryngology

## 2023-04-05 DIAGNOSIS — H9042 Sensorineural hearing loss, unilateral, left ear, with unrestricted hearing on the contralateral side: Secondary | ICD-10-CM

## 2023-04-08 ENCOUNTER — Telehealth: Payer: Self-pay | Admitting: Nurse Practitioner

## 2023-04-08 NOTE — Telephone Encounter (Signed)
Called patient to confirm appointment, stated that she went to the ENT but she still doesn't know anything.  She is scheduled to have a MRI on Aug 1st.  So she was wondering if she should keep her appointment on Tuesday, April 12, 2023 or reschedule it after she gets the results back from the MRI once she goes and gets it done? 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  She stated that the ENT says it could possibly be a small tumor.  Please advise.

## 2023-04-10 ENCOUNTER — Other Ambulatory Visit: Payer: Self-pay | Admitting: Nurse Practitioner

## 2023-04-11 NOTE — Telephone Encounter (Signed)
Called patient and scheduled on 05/03/2023 @ 8:40 am.

## 2023-04-12 ENCOUNTER — Ambulatory Visit: Payer: Medicare Other | Admitting: Nurse Practitioner

## 2023-04-12 NOTE — Telephone Encounter (Signed)
Unable to refill per protocol, Rx expired. Discontinued 05/24/22.  Requested Prescriptions  Pending Prescriptions Disp Refills   MOUNJARO 7.5 MG/0.5ML Pen [Pharmacy Med Name: MOUNJARO 7.5 MG/0.5 ML PEN]  4    Sig: INJECT 7.5 MG SUBCUTANEOUSLY WEEKLY     Off-Protocol Failed - 04/10/2023 11:43 AM      Failed - Medication not assigned to a protocol, review manually.      Passed - Valid encounter within last 12 months    Recent Outpatient Visits           1 month ago Insulin dependent type 2 diabetes mellitus (HCC)   [place] [facility] [place], [place] T, NP   4 months ago Medicare annual wellness visit, subsequent   [place], [place] T, NP   6 months ago Insulin dependent type 2 diabetes mellitus (HCC)   [place] [facility] [place], [place] T, NP   7 months ago Type 2 diabetes mellitus with obesity (HCC)   [place] [facility] [place], [place] T, NP   8 months ago Type 2 diabetes mellitus with obesity (HCC)   [place] [facility] [place], Dorie Rank, NP       Future Appointments             In 1 month Cannady, Dorie Rank, NP [place] [facility]

## 2023-04-13 ENCOUNTER — Encounter: Payer: Self-pay | Admitting: Nurse Practitioner

## 2023-04-13 MED ORDER — GABAPENTIN 100 MG PO CAPS: 100.0000 mg | ORAL_CAPSULE | Freq: Three times a day (TID) | ORAL | 12 refills | Status: DC

## 2023-04-21 ENCOUNTER — Ambulatory Visit
Admission: RE | Admit: 2023-04-21 | Discharge: 2023-04-21 | Disposition: A | Discharge status: Home / Self Care | Payer: Medicare Other | Source: Ambulatory Visit | Attending: Otolaryngology | Admitting: Otolaryngology

## 2023-04-21 DIAGNOSIS — R531 Weakness: Secondary | ICD-10-CM | POA: Diagnosis not present

## 2023-04-21 DIAGNOSIS — H9042 Sensorineural hearing loss, unilateral, left ear, with unrestricted hearing on the contralateral side: Secondary | ICD-10-CM

## 2023-04-21 DIAGNOSIS — H9192 Unspecified hearing loss, left ear: Secondary | ICD-10-CM | POA: Diagnosis not present

## 2023-04-21 DIAGNOSIS — R202 Paresthesia of skin: Secondary | ICD-10-CM | POA: Diagnosis not present

## 2023-04-21 MED ORDER — GADOPICLENOL 0.5 MMOL/ML IV SOLN
8.0000 mL | Freq: Once | INTRAVENOUS | Status: AC | PRN
  Administered 2023-04-21: 8 mL via INTRAVENOUS

## 2023-05-03 ENCOUNTER — Ambulatory Visit: Payer: Medicare Other | Admitting: Nurse Practitioner

## 2023-05-08 NOTE — Patient Instructions (Signed)
Be Involved in Caring For Your Health:  Taking Medications When medications are taken as directed, they can greatly improve your health. But if they are not taken as prescribed, they may not work. In some cases, not taking them correctly can be harmful. To help ensure your treatment remains effective and safe, understand your medications and how to take them. Bring your medications to each visit for review by your provider.  Your lab results, notes, and after visit summary will be available on My Chart. We strongly encourage you to use this feature. If lab results are abnormal the clinic will contact you with the appropriate steps. If the clinic does not contact you assume the results are satisfactory. You can always view your results on My Chart. If you have questions regarding your health or results, please contact the clinic during office hours. You can also ask questions on My Chart.  We at [facility] are grateful that you chose [place] to provide your care. We strive to provide evidence-based and compassionate care and are always looking for feedback. If you get a survey from the clinic please complete this so we can hear your opinions.  Tinnitus Tinnitus is when you hear a sound that there's no actual source for. It may sound like ringing in your ears or something else. The sound may be loud, soft, or somewhere in between. It can last for a few seconds or be constant for days. It can come and go. Almost everyone has tinnitus at some point. It's not the same as hearing loss. But you may need to see a health care provider if: It lasts for a long time. It comes back often. You have trouble sleeping and focusing. What are the causes? The cause of tinnitus is often unknown. In some cases, you may get it if: You're around loud noises, such as from machines or music. An object gets stuck in your ear. Earwax builds up in Landscape architect. You drink a lot of alcohol or caffeine. You take certain  medicines. You start to lose your hearing. You may also get it from some medical conditions. These may include: Ear or sinus infections. Heart diseases. High blood pressure. Allergies. Mnire's disease. Problems with your thyroid. A tumor. This is a growth of cells that isn't normal. A weak, bulging blood vessel called an aneurysm near your ear. What increases the risk? You may be more likely to get tinnitus if: You're around loud noises a lot. You're older. You drink alcohol. You smoke. What are the signs or symptoms? The main symptom is hearing a sound that there's no source for. It may sound like ringing. It may also sound like: Buzzing. Sizzling. Blowing air. Hissing. Whistling. Other sounds may include: Roaring. Running water. A musical note. Tapping. Humming. You may have symptoms in one ear or both ears. How is this diagnosed? Tinnitus is diagnosed based on your symptoms, your medical history, and an exam. Your provider may do a full hearing test if your tinnitus: Is in just one ear. Makes it hard for you to hear. Lasts 6 months or longer. You may also need to see an expert in hearing disorders called an audiologist. They may ask you about your symptoms and how tinnitus affects your daily life. You may have other tests done. These may include: A CT scan. An MRI. An angiogram. This shows how blood flows through your blood vessels. How is this treated? Treatment may include: Therapy to help you manage the stress  of living with tinnitus. Finding ways to mask or cover the sound of tinnitus. These include: Sound or white noise machines. Devices that fit in your ear and play sounds or music. A loud humidifier. Acoustic neural stimulation. This is when you use headphones to listen to music that has a special signal in it. Over time, this signal may change some of the pathways in your brain. This can make you less sensitive to tinnitus. This treatment is used for very  severe cases. Using hearing aids or cochlear implants if your tinnitus is from hearing loss. If your tinnitus is caused by a medical condition, treating the condition may make it go away.  Follow these instructions at home: Managing symptoms     Try to avoid being in loud places or around loud noises. Wear earplugs or headphones when you're around loud noises. Find ways to reduce stress. These may include meditation, yoga, or deep breathing. Sleep with your head slightly raised. General instructions Take over-the-counter and prescription medicines only as told by your provider. Track the things that cause symptoms (triggers). Try to avoid these things. To stop your tinnitus from getting worse: Do not drink alcohol. Do not have caffeine. Do not use any products that contain nicotine or tobacco. These products include cigarettes, chewing tobacco, and vaping devices, such as e-cigarettes. If you need help quitting, ask your provider. Avoid using too much salt. Get enough sleep each night. Where to find more information American Tinnitus Association: https://www.johnson-hamilton.org/ Contact a health care provider if: Your symptoms last for 3 weeks or longer without stopping. You have sudden hearing loss. Your symptoms get worse or don't get better with home care. You can't manage the stress of living with tinnitus. Get help right away if: You get tinnitus after a head injury. You have tinnitus and: Dizziness. Nausea and vomiting. Loss of balance. A sudden, severe headache. Changes to your eyesight. Weakness in your face, arms, or legs. These symptoms may be an emergency. Get help right away. Call 911. Do not wait to see if the symptoms will go away. Do not drive yourself to the hospital. This information is not intended to replace advice given to you by your health care provider. Make sure you discuss any questions you have with your health care provider. Document Revised: 12/13/2022 Document Reviewed:  12/13/2022 Elsevier Patient Education  2024 [employer].

## 2023-05-12 ENCOUNTER — Ambulatory Visit (INDEPENDENT_AMBULATORY_CARE_PROVIDER_SITE_OTHER): Payer: Medicare Other | Admitting: Nurse Practitioner

## 2023-05-12 ENCOUNTER — Encounter: Payer: Self-pay | Admitting: Nurse Practitioner

## 2023-05-12 VITALS — BP 136/70 | HR 78 | Temp 97.6°F | Ht 62.0 in | Wt 170.6 lb

## 2023-05-12 DIAGNOSIS — H9313 Tinnitus, bilateral: Secondary | ICD-10-CM

## 2023-05-12 DIAGNOSIS — R011 Cardiac murmur, unspecified: Secondary | ICD-10-CM

## 2023-05-12 DIAGNOSIS — R002 Palpitations: Secondary | ICD-10-CM

## 2023-05-12 NOTE — Assessment & Plan Note (Addendum)
Suspect some underlying SVT.  No past cardiology notes to review at this time.  Recent episode one week ago.  Past EKG reassuring and Eko EKG reassuring today.  She is able to improve on own with coughing.  Will get into cardiology for further assessment and order Echo for heart murmur noted.

## 2023-05-12 NOTE — Assessment & Plan Note (Signed)
Noted on exam.  Will obtain EKG to further assess.

## 2023-05-12 NOTE — Assessment & Plan Note (Signed)
Overall exam stable.  No cerumen present.  She saw ENT and is going to get assessed for hearing aides.

## 2023-05-12 NOTE — Progress Notes (Signed)
BP 136/70   Pulse 78   Temp 97.6 F (36.4 C) (Oral)   Ht 5\' 2"  (1.575 m)   Wt 170 lb 9.6 oz (77.4 kg)   LMP 07/13/2000 (Approximate)   SpO2 97%   BMI 31.20 kg/m    Subjective:    Patient ID: Charlene Juarez, female    DOB: 02-22-1951, 72 y.o.   MRN: [id number]  HPI: Charlene Juarez is a 72 y.o. female  Chief Complaint  Patient presents with   Tinnitus    4 week f/up- pt states she has been to ENT and that the ringing in her ears is still there. States it is better, but still there.    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  They had no recommendations.  MRI performed, mild age related volume loss.  No acute findings.    Continues to having ringing in ears.  This is worse when she feels her heart beating fast.  Reports when this happens she will cough and it will slow down her HR slowly.  These episodes are intermittent and will last seconds to minutes -- often they come and go away fast.  Sometimes will have for a few days on and off.  Does not know what is trigger for this, other then heat.  Has been outside and had episodes of this.  With the palpitations does not get CP, but will get some SOB and ringing will get louder in ears.  Has not seen cardiology in past.  No palpitations today.  Recent EKG in September was reassuring. 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 Interim medical history since our last visit reviewed. Allergies and medications reviewed and updated.  Review of Systems  Constitutional:  Negative for activity change, appetite change, diaphoresis, fatigue and fever.  HENT:  Positive for tinnitus.   Respiratory:  Positive for shortness of breath (only with episodes of palpitations at times). Negative for cough, chest tightness and wheezing.   Cardiovascular:  Positive for palpitations. Negative for chest pain and leg swelling.  Gastrointestinal: Negative.   Endocrine: Negative for polydipsia, polyphagia and polyuria.  Neurological: Negative.   Psychiatric/Behavioral: Negative.      Per HPI unless specifically indicated above     Objective:    BP 136/70   Pulse 78   Temp 97.6 F (36.4 C) (Oral)   Ht 5\' 2"  (1.575 m)   Wt 170 lb 9.6 oz (77.4 kg)   LMP 07/13/2000 (Approximate)   SpO2 97%   BMI 31.20 kg/m   Wt Readings from Last 3 Encounters:  05/12/23 170 lb 9.6 oz (77.4 kg)  02/24/23 171 lb 9.6 oz (77.8 kg)  12/27/22 169 lb (76.7 kg)    Physical Exam Vitals and nursing note reviewed.  Constitutional:      General: She is awake. She is not in acute distress.    Appearance: She is well-developed and well-groomed. She is obese. She is not ill-appearing or toxic-appearing.  HENT:     Head: Normocephalic.     Right Ear: Hearing, tympanic membrane, ear canal and external ear normal.     Left Ear: Hearing, tympanic membrane, ear canal and external ear normal.     Ears:     Comments: No cerumen impaction to bilateral ears noted. Eyes:     General: Lids are normal. No scleral icterus.    Pupils: Pupils are equal, round, and reactive to light.  Neck:     Vascular: No carotid bruit.  Cardiovascular:     Rate and Rhythm: Normal rate and regular rhythm.     Pulses: Normal pulses.     Heart sounds: Murmur heard.     Systolic murmur is present with a grade of 2/6.     No gallop.   Pulmonary:     Effort: Pulmonary effort is normal. No accessory muscle usage or respiratory distress.     Breath sounds: Normal breath sounds.  Abdominal:     General: Bowel sounds are normal. There is no distension.     Palpations: Abdomen is soft.     Tenderness: There is no abdominal tenderness.  Musculoskeletal:     Cervical back: Full passive range of motion without pain.     Right lower leg: No edema.     Left lower leg: No edema.  Skin:    General: Skin is warm and dry.  Neurological:     Mental Status: She is alert.     Deep Tendon Reflexes: Reflexes are normal and symmetric.     Reflex Scores:      Bicep reflexes are 2+ on the right side and 2+ on the left side.      Brachioradialis reflexes are 2+ on the right side and 2+ on the left side. Psychiatric:        Attention and Perception: Attention normal.        Mood and Affect: Mood normal.        Speech: Speech normal.        Behavior: Behavior is cooperative.        Thought Content: Thought content normal.    Results for orders placed or performed in visit on 02/24/23  Bayer DCA Hb A1c Waived  Result Value Ref Range   HB A1C (BAYER DCA - WAIVED) 5.7 (H) 4.8 - 5.6 %  Comprehensive metabolic panel  Result Value Ref Range   Glucose 99 70 - 99 mg/dL   BUN 19 8 - 27 mg/dL   Creatinine, Ser 1.09 0.57 - 1.00 mg/dL   eGFR 82 >32 TF/TDD/2.20   BUN/Creatinine Ratio 25 12 - 28   Sodium 138 134 - 144 mmol/L   Potassium 4.3 3.5 - 5.2 mmol/L   Chloride 100 96 - 106 mmol/L   CO2 23 20 - 29 mmol/L   Calcium 9.5 8.7 - 10.3 mg/dL   Total Protein 6.7 6.0 - 8.5 g/dL   Albumin 4.5 3.8 - 4.8 g/dL   Globulin, Total 2.2 1.5 - 4.5 g/dL   Albumin/Globulin Ratio 2.0 1.2 - 2.2   Bilirubin Total 0.3 0.0 - 1.2 mg/dL   Alkaline Phosphatase 61 44 - 121 IU/L   AST 42 (H) 0 - 40 IU/L   ALT 58 (H) 0 - 32 IU/L  Lipid Panel w/o Chol/HDL Ratio  Result Value Ref Range   Cholesterol, Total 141 100 - 199 mg/dL   Triglycerides 84 0 - 149  mg/dL   HDL 72 >25 mg/dL   VLDL  Cholesterol Cal 16 5 - 40 mg/dL   LDL Chol Calc (NIH) 53 0 - 99 mg/dL      Assessment & Plan:   Problem List Items Addressed This Visit       Other   Intermittent palpitations    Suspect some underlying SVT.  No past cardiology notes to review at this time.  Recent episode one week ago.  Past EKG reassuring and Eko EKG reassuring today.  She is able to improve on own with coughing.  Will get into cardiology for further assessment and order Echo for heart murmur noted.      Relevant Orders   Ambulatory referral to Cardiology   Systolic murmur    Noted on exam.  Will obtain EKG to further assess.      Relevant Orders   ECHOCARDIOGRAM COMPLETE   Tinnitus of both ears - Primary    Overall exam stable.  No cerumen present.  She saw ENT and is going to get assessed for hearing aides.        Follow up plan: Return in about 27 days (around 06/08/2023) for T2DM, HTN/HLD.

## 2023-05-15 ENCOUNTER — Other Ambulatory Visit: Payer: Self-pay | Admitting: Nurse Practitioner

## 2023-05-15 ENCOUNTER — Other Ambulatory Visit: Payer: Self-pay | Admitting: Gastroenterology

## 2023-05-16 ENCOUNTER — Encounter: Payer: Self-pay | Admitting: Nurse Practitioner

## 2023-05-16 ENCOUNTER — Other Ambulatory Visit: Payer: Self-pay | Admitting: Nurse Practitioner

## 2023-05-16 MED ORDER — HYDROCHLOROTHIAZIDE 25 MG PO TABS: 25.0000 mg | ORAL_TABLET | Freq: Every day | ORAL | 3 refills | Status: DC

## 2023-05-17 NOTE — Telephone Encounter (Signed)
Requested Prescriptions  Pending Prescriptions Disp Refills   CONTOUR TEST test strip [Pharmacy Med Name: CONTOUR TEST STRIP] 100 strip 4    Sig: USE TO TEST BLOOD SUGAR ONCE A DAY == DX E11.69     Endocrinology: Diabetes - Testing Supplies Passed - 05/16/2023  1:08 AM      Passed - Valid encounter within last 12 months    Recent Outpatient Visits           5 days ago Tinnitus of both ears   [place] [facility] [place], Quanah T, NP   2 months ago Insulin dependent type 2 diabetes mellitus (HCC)   [place] [facility] [place], Corrie Dandy T, NP   5 months ago Medicare annual wellness visit, subsequent   [place] [facility], [place] T, NP   7 months ago Insulin dependent type 2 diabetes mellitus (HCC)   [place] [facility] [place], [place] T, NP   8 months ago Type 2 diabetes mellitus with obesity (HCC)   [place] [facility] [place], Dorie Rank, NP       Future Appointments             In 3 weeks Cannady, Dorie Rank, NP  [facility], [facility]   In 2 months Gollan, Tollie Pizza, MD [facility]

## 2023-05-17 NOTE — Telephone Encounter (Signed)
Requested Prescriptions  Pending Prescriptions Disp Refills   telmisartan (MICARDIS) 80 MG tablet [Pharmacy Med Name: TELMISARTAN 80 MG TABLET] 90 tablet 1    Sig: TAKE 1 TABLET BY MOUTH EVERY DAY     Cardiovascular:  Angiotensin Receptor Blockers Passed - 05/15/2023  8:43 AM      Passed - Cr in normal range and within 180 days    Creatinine, Ser  Date Value Ref Range Status  02/24/2023 0.77 0.57 - 1.00 mg/dL Final         Passed - K in normal range and within 180 days    Potassium  Date Value Ref Range Status  02/24/2023 4.3 3.5 - 5.2 mmol/L Final         Passed - Patient is not pregnant      Passed - Last BP in normal range    BP Readings from Last 1 Encounters:  05/12/23 136/70         Passed - Valid encounter within last 6 months    Recent Outpatient Visits           5 days ago Tinnitus of both ears   [place] [facility] [place], [place] T, NP   2 months ago Insulin dependent type 2 diabetes mellitus (HCC)   [place] [facility] [place], Seaman T, NP   5 months ago Medicare annual wellness visit, subsequent   [place] [facility] [place], Pal[place] T, NP   7 months ago Insulin dependent type 2 diabetes mellitus (HCC)   [place] [facility] [place], Jolene T, NP   8 months ago Type 2 diabetes mellitus with obesity (HCC)   [place] [facility] [place], [place] T, NP       Future Appointments             In 3 weeks Cannady, Dorie Rank, NP [place] [facility], [facility]   In 2 months Gollan, Tollie Pizza, MD [place] [facility]             metFORMIN (GLUCOPHAGE) 1000 MG tablet [Pharmacy Med Name: METFORMIN HCL 1,000 MG TABLET] 180 tablet 1    Sig: TAKE 1 TABLET (1,000 MG TOTAL) BY MOUTH TWICE A DAY WITH FOOD     Endocrinology:  Diabetes - Biguanides Passed - 05/15/2023  8:43 AM      Passed - Cr in normal range and within 360 days    Creatinine, Ser  Date Value  Ref Range Status  02/24/2023 0.77 0.57 - 1.00 mg/dL Final         Passed - HBA1C is between 0 and 7.9 and within 180 days    HB A1C (BAYER DCA - WAIVED)  Date Value Ref Range Status  02/24/2023 5.7 (H) 4.8 - 5.6 % Final    Comment:             Prediabetes: 5.7 - 6.4          Diabetes: >6.4          Glycemic control for adults with diabetes: <7.0          Passed - eGFR in normal range and within 360 days    GFR calc Af Amer  Date Value Ref Range Status  05/08/2020 95 >59 mL/min/1.73 Final    Comment:    **[facility] currently reports eGFR in compliance with the current**   recommendations of the [employer]. [facility] will   update reporting as  new guidelines are published from the NKF-ASN   Task force.    GFR, Estimated  Date Value Ref Range Status  05/24/2022 >60 >60 mL/min Final    Comment:    (NOTE) Calculated using the CKD-EPI Creatinine Equation (2021)    eGFR  Date Value Ref Range Status  02/24/2023 82 >59 mL/min/1.73 Final         Passed - B12 Level in normal range and within 720 days    Vitamin B-12  Date Value Ref Range Status  11/24/2022 722 232 - 1,245 pg/mL Final         Passed - Valid encounter within last 6 months    Recent Outpatient Visits           5 days ago Tinnitus of both ears   [place] [facility] [place], [place] T, NP   2 months ago Insulin dependent type 2 diabetes mellitus (HCC)   [place] [facility] [place], Kilgore T, NP   5 months ago Medicare annual wellness visit, subsequent   [place] [facility] [place], Gaylord T, NP   7 months ago Insulin dependent type 2 diabetes mellitus (HCC)   [place] [facility] [place], [place] T, NP   8 months ago Type 2 diabetes mellitus with obesity (HCC)   [place] [facility] [place], Corrie Dandy T, NP       Future Appointments             In 3 weeks Cannady, Dorie Rank, NP [place] [facility], [facility]   In 2 months Gollan, Tollie Pizza, MD [place] [facility] - CBC within normal limits and completed in the last 12 months    WBC  Date Value Ref Range Status  11/24/2022 6.4 3.4 - 10.8 x10E3/uL Final  05/24/2022 7.3 4.0 - 10.5 K/uL Final   RBC  Date Value Ref Range Status  11/24/2022 4.86 3.77 - 5.28 x10E6/uL Final  05/24/2022 4.38 3.87 - 5.11 MIL/uL Final   Hemoglobin  Date Value Ref Range Status  11/24/2022 14.1 11.1 - 15.9 g/dL Final   Hematocrit  Date Value Ref Range Status  11/24/2022 42.4 34.0 - 46.6 % Final   MCHC  Date Value Ref Range Status  11/24/2022 33.3 31.5 - 35.7 g/dL Final  08/65/7846 96.2 30.0 - 36.0 g/dL Final   [facility]  Date Value Ref Range Status  11/24/2022 29.0 26.6 - 33.0 pg Final  05/24/2022 29.2 26.0 - 34.0 pg Final   MCV  Date Value Ref Range Status  11/24/2022 87 79 - 97 fL Final   No results found for: "PLTCOUNTKUC", "LABPLAT", "POCPLA" RDW  Date Value Ref Range Status  11/24/2022 13.5 11.7 - 15.4 % Final

## 2023-05-21 ENCOUNTER — Other Ambulatory Visit: Payer: Self-pay | Admitting: Nurse Practitioner

## 2023-05-24 NOTE — Telephone Encounter (Signed)
Requested by interface surescripts. Future visit in 2 weeks .  Requested Prescriptions  Pending Prescriptions Disp Refills   rosuvastatin (CRESTOR) 20 MG tablet [Pharmacy Med Name: ROSUVASTATIN CALCIUM 20 MG TAB] 90 tablet 0    Sig: TAKE 1 TABLET BY MOUTH EVERY DAY     Cardiovascular:  Antilipid - Statins 2 Failed - 05/21/2023  8:29 AM      Failed - Lipid Panel in normal range within the last 12 months    Cholesterol, Total  Date Value Ref Range Status  02/24/2023 141 100 - 199 mg/dL Final   LDL Chol Calc (NIH)  Date Value Ref Range Status  02/24/2023 53 0 - 99 mg/dL Final   HDL  Date Value Ref Range Status  02/24/2023 72 >39 mg/dL Final   Triglycerides  Date Value Ref Range Status  02/24/2023 84 0 - 149 mg/dL Final         Passed - Cr in normal range and within 360 days    Creatinine, Ser  Date Value Ref Range Status  02/24/2023 0.77 0.57 - 1.00 mg/dL Final         Passed - Patient is not pregnant      Passed - Valid encounter within last 12 months    Recent Outpatient Visits           1 week ago Tinnitus of both ears   [place] [facility] [place], [place] T, NP   2 months ago Insulin dependent type 2 diabetes mellitus (HCC)   [place] [facility] [place], [place] T, NP   6 months ago Medicare annual wellness visit, subsequent   [place] [facility] [place], [place] T, NP   8 months ago Insulin dependent type 2 diabetes mellitus (HCC)   [place] [facility] [place], [place] T, NP   9 months ago Type 2 diabetes mellitus with obesity (HCC)   [place] [facility] [place], Dorie Rank, NP       Future Appointments             In 2 weeks Cannady, Dorie Rank, NP [facility], [facility]   In 2 months Gollan, Tollie Pizza, MD [place] [facility] 25 MG TABS tablet [Pharmacy Med Name: JARDIANCE 25 MG TABLET] 90 tablet 0    Sig: TAKE 1  TABLET (25 MG TOTAL) BY MOUTH DAILY.     Endocrinology:  Diabetes - SGLT2 Inhibitors Passed - 05/21/2023  8:29 AM      Passed - Cr in normal range and within 360 days    Creatinine, Ser  Date Value Ref Range Status  02/24/2023 0.77 0.57 - 1.00 mg/dL Final         Passed - HBA1C is between 0 and 7.9 and within 180 days    HB A1C (BAYER DCA - WAIVED)  Date Value Ref Range Status  02/24/2023 5.7 (H) 4.8 - 5.6 % Final    Comment:             Prediabetes: 5.7 - 6.4          Diabetes: >6.4          Glycemic control for adults with diabetes: <7.0          Passed - eGFR in normal range and within 360 days    GFR calc Af Denyse Dago  Date Value Ref Range Status  05/08/2020 95 >59 mL/min/1.73 Final    Comment:    **[facility] currently reports eGFR in compliance with the current**   recommendations of the [employer]. [facility] will   update reporting as new guidelines are published from the NKF-ASN   Task force.    GFR, Estimated  Date Value Ref Range Status  05/24/2022 >60 >60 mL/min Final    Comment:    (NOTE) Calculated using the CKD-EPI Creatinine Equation (2021)    eGFR  Date Value Ref Range Status  02/24/2023 82 >59 mL/min/1.73 Final         Passed - Valid encounter within last 6 months    Recent Outpatient Visits           1 week ago Tinnitus of both ears   [place] [facility] [place], [place] T, NP   2 months ago Insulin dependent type 2 diabetes mellitus (HCC)   [place] [facility] [place], Corrie Dandy T, NP   6 months ago Medicare annual wellness visit, subsequent   [place] [facility] [place], [place] T, NP   8 months ago Insulin dependent type 2 diabetes mellitus (HCC)   [place] [facility] [place], [place] T, NP   9 months ago Type 2 diabetes mellitus with obesity (HCC)   [place] [facility] [place], Dorie Rank, NP       Future Appointments             In 2 weeks Cannady,  Dorie Rank, NP [place] [facility], [facility]   In 2 months Gollan, Tollie Pizza, MD [facility]

## 2023-06-05 NOTE — Patient Instructions (Signed)
Be Involved in Caring For Your Health:  Taking Medications When medications are taken as directed, they can greatly improve your health. But if they are not taken as prescribed, they may not work. In some cases, not taking them correctly can be harmful. To help ensure your treatment remains effective and safe, understand your medications and how to take them. Bring your medications to each visit for review by your provider.  Your lab results, notes, and after visit summary will be available on My Chart. We strongly encourage you to use this feature. If lab results are abnormal the clinic will contact you with the appropriate steps. If the clinic does not contact you assume the results are satisfactory. You can always view your results on My Chart. If you have questions regarding your health or results, please contact the clinic during office hours. You can also ask questions on My Chart.  We at [facility] are grateful that you chose [place] to provide your care. We strive to provide evidence-based and compassionate care and are always looking for feedback. If you get a survey from the clinic please complete this so we can hear your opinions.  Diabetes Mellitus Action Plan Following a diabetes action plan is a way for you to manage your diabetes (diabetes mellitus) symptoms. The plan is color-coded to help you understand what actions you need to take based on any symptoms you are having. If you have symptoms in the red zone, you need medical care right away. If you have symptoms in the yellow zone, you are having problems. If you have symptoms in the green zone, you are doing well. Learning about and understanding diabetes can take time. Follow the plan that you develop with your health care provider. Know the target range for your blood sugar (glucose) level, and review your treatment plan with your health care provider at each visit. The target range for my blood sugar level is  __________________________ mg/dL. Red zone Get medical help right away if you have any of the following symptoms: A blood sugar test result that is below 54 mg/dL (3 mmol/L). A blood sugar test result that is at or above 240 mg/dL (46.9 mmol/L) for 2 days in a row. Confusion or trouble thinking clearly. Difficulty breathing. Sickness or a fever for 2 or more days that is not getting better. Moderate or large ketone levels in your urine. Feeling tired or having no energy. If you have any red zone symptoms, do not wait to see if the symptoms will go away. Get medical help right away. Call your local emergency services (911 in the [place] Do not drive yourself to the hospital. If you have severely low blood sugar (severe hypoglycemia) and you cannot eat or drink, you may need glucagon. Make sure a family member or close friend knows how to check your blood sugar and how to give you glucagon. You may need to be treated in a hospital for this condition. Yellow zone If you have any of the following symptoms, your diabetes is not under control and you may need to make some changes: A blood sugar test result that is at or above 240 mg/dL (62.9 mmol/L) for 2 days in a row. Blood sugar test results that are below 70 mg/dL (3.9 mmol/L). Other symptoms of hypoglycemia, such as: Shaking or feeling light-headed. Confusion or irritability. Feeling hungry. Having a fast heartbeat. If you have any yellow zone symptoms: Treat your hypoglycemia by eating or drinking 15 grams of  a rapid-acting carbohydrate. Follow the 15:15 rule: Take 15 grams of a rapid-acting carbohydrate, such as: 1 tube of glucose gel. 4 glucose pills. 4 oz (120 mL) of fruit juice. 4 oz (120 mL) of regular (not diet) soda. Check your blood sugar 15 minutes after you take the carbohydrate. If the repeat blood sugar test is still at or below 70 mg/dL (3.9 mmol/L), take 15 grams of a carbohydrate again. If your blood sugar does not  increase above 70 mg/dL (3.9 mmol/L) after 3 tries, get medical help right away. After your blood sugar returns to normal, eat a meal or a snack within 1 hour. Keep taking your daily medicines as told by your health care provider. Check your blood sugar more often than you normally would. Write down your results. Call your health care provider if you have trouble keeping your blood sugar in your target range.  Green zone These signs mean you are doing well and you can continue what you are doing to manage your diabetes: Your blood sugar is within your personal target range. For most people, a blood sugar level before a meal (preprandial) should be 80-130 mg/dL (0.8-6.5 mmol/L). You feel well, and you are able to do daily activities. If you are in the green zone, continue to manage your diabetes as told by your health care provider. To do this: Eat a healthy diet. Exercise regularly. Check your blood sugar as told by your health care provider. Take your medicines as told by your health care provider.  Where to find more information American Diabetes Association (ADA): diabetes.org Association of Diabetes Care & Education Specialists (ADCES): diabeteseducator.org Summary Following a diabetes action plan is a way for you to manage your diabetes symptoms. The plan is color-coded to help you understand what actions you need to take based on any symptoms you are having. Follow the plan that you develop with your health care provider. Make sure you know your personal target blood sugar level. Review your treatment plan with your health care provider at each visit. This information is not intended to replace advice given to you by your health care provider. Make sure you discuss any questions you have with your health care provider. Document Revised: 03/13/2020 Document Reviewed: 03/13/2020 Elsevier Patient Education  2024 [employer].

## 2023-06-08 ENCOUNTER — Other Ambulatory Visit: Payer: Self-pay | Admitting: Nurse Practitioner

## 2023-06-08 ENCOUNTER — Telehealth: Payer: Medicare Other

## 2023-06-08 ENCOUNTER — Other Ambulatory Visit: Payer: Medicare Other

## 2023-06-08 ENCOUNTER — Other Ambulatory Visit: Payer: Self-pay

## 2023-06-08 DIAGNOSIS — Z1231 Encounter for screening mammogram for malignant neoplasm of breast: Secondary | ICD-10-CM

## 2023-06-08 NOTE — Patient Instructions (Addendum)
Visit Information  Thank you for taking time to visit with me today. Please don't hesitate to contact me if I can be of assistance to you before our next scheduled telephone appointment.  Following are the goals we discussed today:   Goals Addressed             This Visit's Progress    RNCM  Care Management Expected Outcome:  Monitor, Self-Manage and Reduce Symptoms of Diabetes       Current Barriers:  Knowledge Deficits related to diabetes management education and benefits of controlled DM  Care Coordination needs related to denial of Dexcom and reordering of Dexcom meter, may need pharm D support in the future in a patient with DM  Chronic Disease Management support and education needs related to effective management of DM Lab Results  Component Value Date   HGBA1C 5.7 (H) 02/24/2023    Planned Interventions: Provided education to patient about basic DM disease process. The patient is doing well with the management of her DM. Her most recent A1C lower than previous and she is doing well with the current plan of care.  Denies any acute findings related to her DM. The patient is doing well with the management of her DM.  Reviewed medications with patient and discussed importance of medication adherence. The patient is compliant with her medications. She is on the Jardiance,  Metformin,  and Levimir for effective management of her DM. The patient cannot tolerate Mounjaro or Ozempic. The patient is doing well with the current plan of care. A1C is at target.  Reviewed prescribed diet with patient heart healthy/ADA diet. Discussed making sure she eats a light snack at bedtime to help with regulation of sugars during sleep times. She is eating snacks at bedtime and this is helping keep her blood sugars stable during the night; Counseled on importance of regular laboratory monitoring as prescribed. The patient is under goal with A1C and is thankful for this. Review of maintaining good control of  her blood sugars.    Discussed plans with patient for ongoing care management follow up and provided patient with direct contact information for care management team;      Provided patient with written educational materials related to hypo and hyperglycemia and importance of correct treatment. She is using the Dexcom. The patient knows to call for any acute changes in her blood glucose readings. The patient knows how to effectively manage her hypo and hyperglycemia. The lowest she has seen is 59. Recommendations provided.     Reviewed scheduled/upcoming provider appointments including: 06-09-2023 with the pcp.        Advised patient, providing education and rationale, to check cbg when you have symptoms of low or high blood sugar and has a continuous glucose reader at this time  and record. The patient has her Dexcom G-7 and it is doing well for her. She is happy she has that.  call provider for findings outside established parameters;       Review of patient status, including review of consultants reports, relevant laboratory and other test results, and medications completed;       Advised patient to discuss changes in DM health and well being with provider;      Screening for signs and symptoms of depression related to chronic disease state;        Assessed social determinant of health barriers;    The patient is having some tinnitus in her ears that she has noticed more recently.  She has a sound machine and that helps a lot most of the time but not all of the time. Will send information to the patient about the Epley maneuver to see if this possibly may help with her tinnitus she is experiencing. She states this has been going on for a while. Denies any safety concerns. She is having issues with sleep patterns at times with the tinnitus. States melatonin and other OTC products  are not helpful. The patient states that the pcp does not want to give her Ambien. Discussed the risk of Ambien. The patient has  Trazadone but she states this is not helpful for her. An inbasket message sent to the pcp for recommendations. The patient is open for recommendations on helping with her insomnia. Symptom Management: Take medications as prescribed   Attend all scheduled provider appointments Call provider office for new concerns or questions  call the Suicide and Crisis Lifeline: 988 call the [place] National Suicide Prevention Lifeline: 610 716 0721 or TTY: 760-566-4759 TTY 660-331-8508) to talk to a trained counselor call 1-800-273-TALK (toll free, 24 hour hotline) if experiencing a Mental Health or Behavioral Health Crisis  keep appointment with eye doctor check feet daily for cuts, sores or redness trim toenails straight across manage portion size wash and dry feet carefully every day wear comfortable, cotton socks wear comfortable, well-fitting shoes  Follow Up Plan: Telephone follow up appointment with care management team member scheduled for: 08-10-2023 at [facility] Care Management Expected Outcome:  Monitor, Self-Manage and Reduce Symptoms of: HLD       Current Barriers:  Chronic Disease Management support and education needs related to for effective management of HLD Lab Results  Component Value Date   CHOL 141 02/24/2023   HDL 72 02/24/2023   LDLCALC 53 02/24/2023   TRIG 84 02/24/2023     Planned Interventions: Provider established cholesterol goals reviewed. The patient is at goal and doing well with management of her HLD. Doing well. Denies any acute changes in her heart health Education and support given. ; Counseled on importance of regular laboratory monitoring as prescribed. Has labs on a regular basis.; Provided HLD educational materials; Reviewed role and benefits of statin for ASCVD risk reduction; Discussed strategies to manage statin-induced myalgias; Reviewed importance of limiting foods high in cholesterol; Reviewed exercise goals and target of 150 minutes per week.  Is active in her garden and busy during the summer with her family. States she feels good and is doing well. Loves to crochet and make things. ; Screening for signs and symptoms of depression related to chronic disease state;  Assessed social determinant of health barriers;   Symptom Management: Take medications as prescribed   Attend all scheduled provider appointments Call provider office for new concerns or questions  call the Suicide and Crisis Lifeline: 988 call the [place] National Suicide Prevention Lifeline: 435-813-5254 or TTY: 631 313 2321 TTY 208 339 6899) to talk to a trained counselor call 1-800-273-TALK (toll free, 24 hour hotline) if experiencing a Mental Health or Behavioral Health Crisis  - call for medicine refill 2 or 3 days before it runs out - take all medications exactly as prescribed - call doctor with any symptoms you believe are related to your medicine - call doctor when you experience any new symptoms - go to all doctor appointments as scheduled - adhere to prescribed diet: heart healthy/ADA diet  - develop an exercise routine  Follow Up Plan: Telephone follow up appointment with care management team  member scheduled for: 08-10-2023 at 0900 am       RNCM Care Management:  Expected Outcome:  Monitor, Self-Manage, and Reduce Symptoms of Hypertension       Current Barriers:  Knowledge Deficits related to the benefits for taking blood pressures on a regular basis and recording Chronic Disease Management support and education needs related to effective management of HTN BP Readings from Last 3 Encounters:  05/12/23 136/70  02/24/23 127/74  12/09/22 123/74     Planned Interventions: Evaluation of current treatment plan related to hypertension self management and patient's adherence to plan as established by provider. The patient is doing well with management of her blood pressures and her blood pressures are stable at this time.  Denies any acute changes in her  HTN or heart health. Blood pressures are stable;   Provided education to patient re: stroke prevention, s/s of heart attack and stroke. Education and support; Reviewed prescribed diet heart healthy/ADA diet. The patient is mindful of her dietary intake. The patient watches what she eats. She is compliant with dietary restrictions.   Reviewed medications with patient and discussed importance of compliance. The patient is compliant with medications. Review of pharm D support and to let the [facility] know if she needs support from pharm D.   Counseled on the importance of exercise goals with target of 150 minutes per week. The patient is active and loves to garden and do arrangements with flowers. Education on walking at least 15 minutes at a moderate pace can improve blood sugars, help with heart function and circulation. Encouraged increased activity.  Discussed plans with patient for ongoing care management follow up and provided patient with direct contact information for care management team; Advised patient, providing education and rationale, to monitor blood pressure daily and record, calling PCP for findings outside established parameters. The patient normally does not take blood pressures at home. Has a cuff. Education and support given on the benefits of checking blood pressures on a regular basis;  Reviewed scheduled/upcoming provider appointments including: 06-09-2023 at 0840 am with the pcp Advised patient to discuss changes in her blood pressures and heart health with provider, reminder provided today; Provided education on prescribed diet heart healthy/ADA diet. Talked about eating smaller more frequent meals to keep her chronic conditions stable ;  Discussed complications of poorly controlled blood pressure such as heart disease, stroke, circulatory complications, vision complications, kidney impairment, sexual dysfunction;  Screening for signs and symptoms of depression related to chronic disease  state;  Assessed social determinant of health barriers;   Symptom Management: Take medications as prescribed   Attend all scheduled provider appointments Call provider office for new concerns or questions  call the Suicide and Crisis Lifeline: 988 call the [place] National Suicide Prevention Lifeline: 775-344-8109 or TTY: 727-610-7050 TTY 507-298-7282) to talk to a trained counselor call 1-800-273-TALK (toll free, 24 hour hotline) if experiencing a Mental Health or Behavioral Health Crisis  check blood pressure weekly learn about high blood pressure call doctor for signs and symptoms of high blood pressure develop an action plan for high blood pressure keep all doctor appointments take medications for blood pressure exactly as prescribed begin an exercise program report new symptoms to your doctor  Follow Up Plan: Telephone follow up appointment with care management team member scheduled for: 08-10-2023 at 0900 am           Our next appointment is by telephone on 08-10-2023 at 9 am  Please call the care guide team  at [phone] if you need to cancel or reschedule your appointment.   If you are experiencing a Mental Health or Behavioral Health Crisis or need someone to talk to, please call the Suicide and Crisis Lifeline: 988 call the [place] National Suicide Prevention Lifeline: 952-084-7912 or TTY: 760-639-8136 TTY [phone]) to talk to a trained counselor call 1-800-273-TALK (toll free, 24 hour hotline)   Patient verbalizes understanding of instructions and care plan provided today and agrees to view in MyChart. Active MyChart status and patient understanding of how to access instructions and care plan via MyChart confirmed with patient.     Telephone follow up appointment with care management team member scheduled for: 08-10-2023 at 0900 am  Alto Denver RN, MSN, CCM RN Care Manager  [facility]  Ambulatory Care Management  Direct Number: [phone]   How to Perform  the Epley Maneuver The Epley maneuver is an exercise that relieves symptoms of vertigo. Vertigo is the feeling that you or your surroundings are moving when they are not. When you feel vertigo, you may feel like the room is spinning and may have trouble walking. The Epley maneuver is used for a type of vertigo caused by a calcium deposit in a part of the inner ear. The maneuver involves changing head positions to help the deposit move out of the area. You can do this maneuver at home whenever you have symptoms of vertigo. You can repeat it in 24 hours if your vertigo has not gone away. Even though the Epley maneuver may relieve your vertigo for a few weeks, it is possible that your symptoms will return. This maneuver relieves vertigo, but it does not relieve dizziness. What are the risks? If it is done correctly, the Epley maneuver is considered safe. Sometimes it can lead to dizziness or nausea that goes away after a short time. If you develop other symptoms--such as changes in vision, weakness, or numbness--stop doing the maneuver and call your health care provider. Supplies needed: A bed or table. A pillow. How to do the Epley maneuver     Sit on the edge of a bed or table with your back straight and your legs extended or hanging over the edge of the bed or table. Turn your head halfway toward the affected ear or side as told by your health care provider. Lie backward quickly with your head turned until you are lying flat on your back. Your head should dangle (head-hanging position). You may want to position a pillow under your shoulders. Hold this position for at least 30 seconds. If you feel dizzy or have symptoms of vertigo, continue to hold the position until the symptoms stop. Turn your head to the opposite direction until your unaffected ear is facing down. Your head should continue to dangle. Hold this position for at least 30 seconds. If you feel dizzy or have symptoms of vertigo,  continue to hold the position until the symptoms stop. Turn your whole body to the same side as your head so that you are positioned on your side. Your head will now be nearly facedown and no longer needs to dangle. Hold for at least 30 seconds. If you feel dizzy or have symptoms of vertigo, continue to hold the position until the symptoms stop. Sit back up. You can repeat the maneuver in 24 hours if your vertigo does not go away. Follow these instructions at home: For 24 hours after doing the Epley maneuver: Keep your head in an upright position. When lying  down to sleep or rest, keep your head raised (elevated) with two or more pillows. Avoid excessive neck movements. Activity Do not drive or use machinery if you feel dizzy. After doing the Epley maneuver, return to your normal activities as told by your health care provider. Ask your health care provider what activities are safe for you. General instructions Drink enough fluid to keep your urine pale yellow. Do not drink alcohol. Take over-the-counter and prescription medicines only as told by your health care provider. Keep all follow-up visits. This is important. Preventing vertigo symptoms Ask your health care provider if there is anything you should do at home to prevent vertigo. He or she may recommend that you: Keep your head elevated with two or more pillows while you sleep. Do not sleep on the side of your affected ear. Get up slowly from bed. Avoid sudden movements during the day. Avoid extreme head positions or movement, such as looking up or bending over. Contact a health care provider if: Your vertigo gets worse. You have other symptoms, including: Nausea. Vomiting. Headache. Get help right away if you: Have vision changes. Have a headache or neck pain that is severe or getting worse. Cannot stop vomiting. Have new numbness or weakness in any part of your body. These symptoms may represent a serious problem that is  an emergency. Do not wait to see if the symptoms will go away. Get medical help right away. Call your local emergency services (911 in the [place] Do not drive yourself to the hospital. Summary Vertigo is the feeling that you or your surroundings are moving when they are not. The Epley maneuver is an exercise that relieves symptoms of vertigo. If the Epley maneuver is done correctly, it is considered safe. This information is not intended to replace advice given to you by your health care provider. Make sure you discuss any questions you have with your health care provider. Document Revised: 08/06/2020 Document Reviewed: 08/06/2020 Elsevier Patient Education  2024 [employer].

## 2023-06-08 NOTE — Patient Outreach (Signed)
Care Management   Visit Note  06/08/2023 Name: Charlene Juarez MRN: [id number] DOB: December 22, 1950  Subjective: Charlene Juarez is a 72 y.o. year old female who is a primary care patient of Cannady, Dorie Rank, NP. The Care Management team was consulted for assistance.      Engaged with patient spoke with patient by telephone.    Goals Addressed             This Visit's Progress    RNCM  Care Management Expected Outcome:  Monitor, Self-Manage and Reduce Symptoms of Diabetes       Current Barriers:  Knowledge Deficits related to diabetes management education and benefits of controlled DM  Care Coordination needs related to denial of Dexcom and reordering of Dexcom meter, may need pharm D support in the future in a patient with DM  Chronic Disease Management support and education needs related to effective management of DM Lab Results  Component Value Date   HGBA1C 5.7 (H) 02/24/2023    Planned Interventions: Provided education to patient about basic DM disease process. The patient is doing well with the management of her DM. Her most recent A1C lower than previous and she is doing well with the current plan of care.  Denies any acute findings related to her DM. The patient is doing well with the management of her DM.  Reviewed medications with patient and discussed importance of medication adherence. The patient is compliant with her medications. She is on the Jardiance,  Metformin,  and Levimir for effective management of her DM. The patient cannot tolerate Mounjaro or Ozempic. The patient is doing well with the current plan of care. A1C is at target.  Reviewed prescribed diet with patient heart healthy/ADA diet. Discussed making sure she eats a light snack at bedtime to help with regulation of sugars during sleep times. She is eating snacks at bedtime and this is helping keep her blood sugars stable during the night; Counseled on importance of regular laboratory monitoring  as prescribed. The patient is under goal with A1C and is thankful for this. Review of maintaining good control of her blood sugars.    Discussed plans with patient for ongoing care management follow up and provided patient with direct contact information for care management team;      Provided patient with written educational materials related to hypo and hyperglycemia and importance of correct treatment. She is using the Dexcom. The patient knows to call for any acute changes in her blood glucose readings. The patient knows how to effectively manage her hypo and hyperglycemia. The lowest she has seen is 59. Recommendations provided.     Reviewed scheduled/upcoming provider appointments including: 06-09-2023 with the pcp.        Advised patient, providing education and rationale, to check cbg when you have symptoms of low or high blood sugar and has a continuous glucose reader at this time  and record. The patient has her Dexcom G-7 and it is doing well for her. She is happy she has that.  call provider for findings outside established parameters;       Review of patient status, including review of consultants reports, relevant laboratory and other test results, and medications completed;       Advised patient to discuss changes in DM health and well being with provider;      Screening for signs and symptoms of depression related to chronic disease state;        Assessed social determinant of  health barriers;    The patient is having some tinnitus in her ears that she has noticed more recently. She has a sound machine and that helps a lot most of the time but not all of the time. Will send information to the patient about the Epley maneuver to see if this possibly may help with her tinnitus she is experiencing. She states this has been going on for a while. Denies any safety concerns. She is having issues with sleep patterns at times with the tinnitus. States melatonin and other OTC products  are not  helpful. The patient states that the pcp does not want to give her Ambien. Discussed the risk of Ambien. The patient has Trazadone but she states this is not helpful for her. An inbasket message sent to the pcp for recommendations. The patient is open for recommendations on helping with her insomnia. Symptom Management: Take medications as prescribed   Attend all scheduled provider appointments Call provider office for new concerns or questions  call the Suicide and Crisis Lifeline: 988 call the [place] National Suicide Prevention Lifeline: (308)658-1307 or TTY: (806)260-2226 TTY 731-031-8358) to talk to a trained counselor call 1-800-273-TALK (toll free, 24 hour hotline) if experiencing a Mental Health or Behavioral Health Crisis  keep appointment with eye doctor check feet daily for cuts, sores or redness trim toenails straight across manage portion size wash and dry feet carefully every day wear comfortable, cotton socks wear comfortable, well-fitting shoes  Follow Up Plan: Telephone follow up appointment with care management team member scheduled for: 08-10-2023 at [facility] Care Management Expected Outcome:  Monitor, Self-Manage and Reduce Symptoms of: HLD       Current Barriers:  Chronic Disease Management support and education needs related to for effective management of HLD Lab Results  Component Value Date   CHOL 141 02/24/2023   HDL 72 02/24/2023   LDLCALC 53 02/24/2023   TRIG 84 02/24/2023     Planned Interventions: Provider established cholesterol goals reviewed. The patient is at goal and doing well with management of her HLD. Doing well. Denies any acute changes in her heart health Education and support given. ; Counseled on importance of regular laboratory monitoring as prescribed. Has labs on a regular basis.; Provided HLD educational materials; Reviewed role and benefits of statin for ASCVD risk reduction; Discussed strategies to manage statin-induced  myalgias; Reviewed importance of limiting foods high in cholesterol; Reviewed exercise goals and target of 150 minutes per week. Is active in her garden and busy during the summer with her family. States she feels good and is doing well. Loves to crochet and make things. ; Screening for signs and symptoms of depression related to chronic disease state;  Assessed social determinant of health barriers;   Symptom Management: Take medications as prescribed   Attend all scheduled provider appointments Call provider office for new concerns or questions  call the Suicide and Crisis Lifeline: 988 call the [place] National Suicide Prevention Lifeline: (941)060-0973 or TTY: 760 491 4300 TTY (573) 515-5568) to talk to a trained counselor call 1-800-273-TALK (toll free, 24 hour hotline) if experiencing a Mental Health or Behavioral Health Crisis  - call for medicine refill 2 or 3 days before it runs out - take all medications exactly as prescribed - call doctor with any symptoms you believe are related to your medicine - call doctor when you experience any new symptoms - go to all doctor appointments as scheduled - adhere to prescribed diet: heart  healthy/ADA diet  - develop an exercise routine  Follow Up Plan: Telephone follow up appointment with care management team member scheduled for: 08-10-2023 at 0900 am       RNCM Care Management:  Expected Outcome:  Monitor, Self-Manage, and Reduce Symptoms of Hypertension       Current Barriers:  Knowledge Deficits related to the benefits for taking blood pressures on a regular basis and recording Chronic Disease Management support and education needs related to effective management of HTN BP Readings from Last 3 Encounters:  05/12/23 136/70  02/24/23 127/74  12/09/22 123/74     Planned Interventions: Evaluation of current treatment plan related to hypertension self management and patient's adherence to plan as established by provider. The patient is  doing well with management of her blood pressures and her blood pressures are stable at this time.  Denies any acute changes in her HTN or heart health. Blood pressures are stable;   Provided education to patient re: stroke prevention, s/s of heart attack and stroke. Education and support; Reviewed prescribed diet heart healthy/ADA diet. The patient is mindful of her dietary intake. The patient watches what she eats. She is compliant with dietary restrictions.   Reviewed medications with patient and discussed importance of compliance. The patient is compliant with medications. Review of pharm D support and to let the [facility] know if she needs support from pharm D.   Counseled on the importance of exercise goals with target of 150 minutes per week. The patient is active and loves to garden and do arrangements with flowers. Education on walking at least 15 minutes at a moderate pace can improve blood sugars, help with heart function and circulation. Encouraged increased activity.  Discussed plans with patient for ongoing care management follow up and provided patient with direct contact information for care management team; Advised patient, providing education and rationale, to monitor blood pressure daily and record, calling PCP for findings outside established parameters. The patient normally does not take blood pressures at home. Has a cuff. Education and support given on the benefits of checking blood pressures on a regular basis;  Reviewed scheduled/upcoming provider appointments including: 06-09-2023 at 0840 am with the pcp Advised patient to discuss changes in her blood pressures and heart health with provider, reminder provided today; Provided education on prescribed diet heart healthy/ADA diet. Talked about eating smaller more frequent meals to keep her chronic conditions stable ;  Discussed complications of poorly controlled blood pressure such as heart disease, stroke, circulatory complications,  vision complications, kidney impairment, sexual dysfunction;  Screening for signs and symptoms of depression related to chronic disease state;  Assessed social determinant of health barriers;   Symptom Management: Take medications as prescribed   Attend all scheduled provider appointments Call provider office for new concerns or questions  call the Suicide and Crisis Lifeline: 988 call the [place] National Suicide Prevention Lifeline: (938) 736-6855 or TTY: 305-795-7886 TTY (940)610-7998) to talk to a trained counselor call 1-800-273-TALK (toll free, 24 hour hotline) if experiencing a Mental Health or Behavioral Health Crisis  check blood pressure weekly learn about high blood pressure call doctor for signs and symptoms of high blood pressure develop an action plan for high blood pressure keep all doctor appointments take medications for blood pressure exactly as prescribed begin an exercise program report new symptoms to your doctor  Follow Up Plan: Telephone follow up appointment with care management team member scheduled for: 08-10-2023 at 0900 am  Consent to Services:  Patient was given information about care management services, agreed to services, and gave verbal consent to participate.   Plan: Telephone follow up appointment with care management team member scheduled for: 08-10-2023 at 0900 am  Alto Denver RN, MSN, CCM RN Care Manager  [facility]  Ambulatory Care Management  Direct Number: [phone]

## 2023-06-09 ENCOUNTER — Ambulatory Visit (INDEPENDENT_AMBULATORY_CARE_PROVIDER_SITE_OTHER): Payer: Medicare Other | Admitting: Nurse Practitioner

## 2023-06-09 ENCOUNTER — Encounter: Payer: Self-pay | Admitting: Nurse Practitioner

## 2023-06-09 VITALS — BP 115/65 | HR 71 | Temp 97.8°F | Ht 62.0 in | Wt 173.4 lb

## 2023-06-09 DIAGNOSIS — R7989 Other specified abnormal findings of blood chemistry: Secondary | ICD-10-CM | POA: Diagnosis not present

## 2023-06-09 DIAGNOSIS — Z6830 Body mass index (BMI) 30.0-30.9, adult: Secondary | ICD-10-CM

## 2023-06-09 DIAGNOSIS — E1159 Type 2 diabetes mellitus with other circulatory complications: Secondary | ICD-10-CM

## 2023-06-09 DIAGNOSIS — Z794 Long term (current) use of insulin: Secondary | ICD-10-CM | POA: Diagnosis not present

## 2023-06-09 DIAGNOSIS — Z23 Encounter for immunization: Secondary | ICD-10-CM

## 2023-06-09 DIAGNOSIS — R011 Cardiac murmur, unspecified: Secondary | ICD-10-CM | POA: Diagnosis not present

## 2023-06-09 DIAGNOSIS — E6609 Other obesity due to excess calories: Secondary | ICD-10-CM | POA: Diagnosis not present

## 2023-06-09 DIAGNOSIS — E119 Type 2 diabetes mellitus without complications: Secondary | ICD-10-CM | POA: Diagnosis not present

## 2023-06-09 DIAGNOSIS — I152 Hypertension secondary to endocrine disorders: Secondary | ICD-10-CM

## 2023-06-09 DIAGNOSIS — E785 Hyperlipidemia, unspecified: Secondary | ICD-10-CM | POA: Diagnosis not present

## 2023-06-09 DIAGNOSIS — E1169 Type 2 diabetes mellitus with other specified complication: Secondary | ICD-10-CM

## 2023-06-09 DIAGNOSIS — K760 Fatty (change of) liver, not elsewhere classified: Secondary | ICD-10-CM

## 2023-06-09 NOTE — Assessment & Plan Note (Signed)
Chronic, ongoing.  Urine ALB 28 November 2022.  A1c 5.7% last visit which is trended down from previous 6.7%, recheck today and adjust regimen as needed.  Pancreatitis presented with Mounjaro & Ozempic.  Had tolerated Ozempic well for years.  Uses Dexcom. - At this time will maintain off GLP1 or Mounjaro due to pancreatitis in past. - Continue Jardiance and Metformin. Continue Levemir 46 units, reduce this if morning BS consistently less then 130 or lows <70 present.  Continue Novolog pre-meals 5-12 units as needed only (not to take consistently), educated her on this and not to take if does not eat a meal or take after meals. - Dexcom continues.  Continue to work with CCM. - Check blood sugar 4-5 times a day and document for provider.   - Foot exam up to date.  Needs eye exam, but cost is concern. - Statin and ARB on board. - Flu shot and Pneumonia vaccines up to date. Return in 3 months.

## 2023-06-09 NOTE — Assessment & Plan Note (Signed)
BMI 31.72.  Recommended eating smaller high protein, low fat meals more frequently and exercising 30 mins a day 5 times a week with a goal of 10-15lb weight loss in the next 3 months. Patient voiced their understanding and motivation to adhere to these recommendations.

## 2023-06-09 NOTE — Assessment & Plan Note (Signed)
Chronic, noted on past imaging. At this time recommend heavy focus on diet and regular activity + working on diabetes control with goal A1c <7%.

## 2023-06-09 NOTE — Assessment & Plan Note (Signed)
Chronic, ongoing.  Will continue Rosuvastatin daily and adjust dose as needed.  Lipid panel today.

## 2023-06-09 NOTE — Assessment & Plan Note (Signed)
Chronic, stable.  BP at goal in office and at home.  Continue Telmisartan for kidney and hydrochlorothiazide for fluid retention, she is aware this medication could diminish effects of insulin == may need to consider Amlodipine if BP trends up in future.  LABS: CMP.  Urine ALB 28 November 2022.  Recommend she continue to check BP at home on occasion and document for visits. Focus on DASH diet.  Return in 3 months.

## 2023-06-09 NOTE — Progress Notes (Signed)
BP 115/65   Pulse 71   Temp 97.8 F (36.6 C) (Oral)   Ht 5\' 2"  (1.575 m)   Wt 173 lb 6.4 oz (78.7 kg)   LMP 07/13/2000 (Approximate)   SpO2 97%   BMI 31.72 kg/m    Subjective:    Patient ID: Charlene Juarez, female    DOB: December 26, 1950, 72 y.o.   MRN: [id number]  HPI: Charlene Juarez is a 72 y.o. female  Chief Complaint  Patient presents with   Diabetes   Hyperlipidemia   Hypertension   DIABETES June A1c was 5.7%. Taking Tresiba 46 units, Jardiance 25 MG, Metformin 1000 MG BID, Novolog as needed before meals (12 units if needed).  Taking Gabapentin 100 MG BID for neuropathy.   Can not take GLP1 or Mounjaro due to pancreatitis with these.  History of fatty liver noted on past imaging. Uses Dexcom = average over past 90 days has been 121, in range 87% of the time, 7% high, and 4% low. Hypoglycemic episodes:no Polydipsia/polyuria: no Visual disturbance: no Chest pain: no Paresthesias: no Glucose Monitoring: no             Accucheck frequency: daily              Fasting glucose:              Post prandial:             Evening:              Before meals: Taking Insulin?: yes             Long acting insulin: 46 units             Short acting insulin: 5 to 12 units Blood Pressure Monitoring: yes Retinal Examination: Not Up To Date -- [facility], needs to schedule Foot Exam: Up to Date Pneumovax: Up to Date Influenza: Up to Date Aspirin: no   HYPERTENSION / HYPERLIPIDEMIA Continues on Telmisartan + Crestor.  HCTZ was discontinued months back due to use of insulin -- we restarted this as she was concerned about swelling to ankles.   Satisfied with current treatment? yes Duration of hypertension: chronic BP monitoring frequency: not often BP range:  BP medication side effects: no Duration of hyperlipidemia: chronic Cholesterol medication side effects: no Cholesterol supplements: none Medication compliance: good compliance Aspirin: no Recent  stressors: no Recurrent headaches: no Visual changes: no Palpitations: occasional episodes of fast HR -- is scheduled to see Dr. Mariah Milling on 08/01/23 Dyspnea: no Chest pain: no Lower extremity edema: no Dizzy/lightheaded: no   Relevant past medical, surgical, family and social history reviewed and updated as indicated. Interim medical history since our last visit reviewed. Allergies and medications reviewed and updated.  Review of Systems  Constitutional:  Negative for activity change, appetite change, diaphoresis, fatigue and fever.  HENT:  Negative for ear discharge and ear pain.   Respiratory:  Negative for cough, chest tightness, shortness of breath and wheezing.   Cardiovascular:  Negative for chest pain, palpitations and leg swelling.  Endocrine: Negative for polydipsia, polyphagia and polyuria.  Neurological: Negative.   Psychiatric/Behavioral: Negative.      Per HPI unless specifically indicated above     Objective:    BP 115/65   Pulse 71   Temp 97.8 F (36.6 C) (Oral)   Ht 5\' 2"  (1.575 m)   Wt 173 lb 6.4 oz (78.7 kg)   LMP 07/13/2000 (Approximate)   SpO2 97%  BMI 31.72 kg/m   Wt Readings from Last 3 Encounters:  06/09/23 173 lb 6.4 oz (78.7 kg)  05/12/23 170 lb 9.6 oz (77.4 kg)  02/24/23 171 lb 9.6 oz (77.8 kg)    Physical Exam Vitals and nursing note reviewed. Exam conducted with a chaperone present.  Constitutional:      General: She is awake. She is not in acute distress.    Appearance: She is well-developed and well-groomed. She is obese. She is not ill-appearing or toxic-appearing.  HENT:     Head: Normocephalic and atraumatic.     Right Ear: Hearing, tympanic membrane, ear canal and external ear normal. No drainage.     Left Ear: Hearing, tympanic membrane, ear canal and external ear normal. No drainage.     Nose: Nose normal.     Right Sinus: No maxillary sinus tenderness or frontal sinus tenderness.     Left Sinus: No maxillary sinus tenderness or  frontal sinus tenderness.     Mouth/Throat:     Mouth: Mucous membranes are moist.     Pharynx: Oropharynx is clear. Uvula midline. No pharyngeal swelling, oropharyngeal exudate or posterior oropharyngeal erythema.  Eyes:     General: Lids are normal.        Right eye: No discharge.        Left eye: No discharge.     Extraocular Movements: Extraocular movements intact.     Conjunctiva/sclera: Conjunctivae normal.     Pupils: Pupils are equal, round, and reactive to light.     Visual Fields: Right eye visual fields normal and left eye visual fields normal.  Neck:     Thyroid: No thyromegaly.     Vascular: No carotid bruit.     Trachea: Trachea normal.  Cardiovascular:     Rate and Rhythm: Normal rate and regular rhythm.     Heart sounds: Murmur heard.     Systolic murmur is present with a grade of 2/6.     No gallop.  Pulmonary:     Effort: Pulmonary effort is normal. No accessory muscle usage or respiratory distress.     Breath sounds: Normal breath sounds.  Chest:  Breasts:    Right: Normal.     Left: Normal.  Abdominal:     General: Bowel sounds are normal.     Palpations: Abdomen is soft. There is no hepatomegaly or splenomegaly.     Tenderness: There is no abdominal tenderness.  Musculoskeletal:        General: Normal range of motion.     Cervical back: Normal range of motion and neck supple.     Right lower leg: No edema.     Left lower leg: No edema.  Lymphadenopathy:     Head:     Right side of head: No submental, submandibular, tonsillar, preauricular or posterior auricular adenopathy.     Left side of head: No submental, submandibular, tonsillar, preauricular or posterior auricular adenopathy.     Cervical: No cervical adenopathy.     Upper Body:     Right upper body: No supraclavicular, axillary or pectoral adenopathy.     Left upper body: No supraclavicular, axillary or pectoral adenopathy.  Skin:    General: Skin is warm and dry.     Capillary Refill:  Capillary refill takes less than 2 seconds.     Findings: No rash.  Neurological:     Mental Status: She is alert and oriented to person, place, and time.     Gait:  Gait is intact.     Deep Tendon Reflexes: Reflexes are normal and symmetric.     Reflex Scores:      Brachioradialis reflexes are 2+ on the right side and 2+ on the left side.      Patellar reflexes are 2+ on the right side and 2+ on the left side. Psychiatric:        Attention and Perception: Attention normal.        Mood and Affect: Mood normal.        Speech: Speech normal.        Behavior: Behavior normal. Behavior is cooperative.        Thought Content: Thought content normal.        Judgment: Judgment normal.    Results for orders placed or performed in visit on 02/24/23  Bayer DCA Hb A1c Waived  Result Value Ref Range   HB A1C (BAYER DCA - WAIVED) 5.7 (H) 4.8 - 5.6 %  Comprehensive metabolic panel  Result Value Ref Range   Glucose 99 70 - 99 mg/dL   BUN 19 8 - 27 mg/dL   Creatinine, Ser 1.61 0.57 - 1.00 mg/dL   eGFR 82 >09 UE/AVW/0.98   BUN/Creatinine Ratio 25 12 - 28   Sodium 138 134 - 144 mmol/L   Potassium 4.3 3.5 - 5.2 mmol/L   Chloride 100 96 - 106 mmol/L   CO2 23 20 - 29 mmol/L   Calcium 9.5 8.7 - 10.3 mg/dL   Total Protein 6.7 6.0 - 8.5 g/dL   Albumin 4.5 3.8 - 4.8 g/dL   Globulin, Total 2.2 1.5 - 4.5 g/dL   Albumin/Globulin Ratio 2.0 1.2 - 2.2   Bilirubin Total 0.3 0.0 - 1.2 mg/dL   Alkaline Phosphatase 61 44 - 121 IU/L   AST 42 (H) 0 - 40 IU/L   ALT 58 (H) 0 - 32 IU/L  Lipid Panel w/o Chol/HDL Ratio  Result Value Ref Range   Cholesterol, Total 141 100 - 199 mg/dL   Triglycerides 84 0 - 149 mg/dL   HDL 72 >11 mg/dL   VLDL Cholesterol Cal 16 5 - 40 mg/dL   LDL Chol Calc (NIH) 53 0 - 99 mg/dL      Assessment & Plan:   Problem List Items Addressed This Visit       Cardiovascular and Mediastinum   Hypertension associated with diabetes (HCC)    Chronic, stable.  BP at goal in office and  at home.  Continue Telmisartan for kidney and hydrochlorothiazide for fluid retention, she is aware this medication could diminish effects of insulin == may need to consider Amlodipine if BP trends up in future.  LABS: CMP.  Urine ALB 28 November 2022.  Recommend she continue to check BP at home on occasion and document for visits. Focus on DASH diet.  Return in 3 months.        Digestive   Fatty liver    Chronic, noted on past imaging. At this time recommend heavy focus on diet and regular activity + working on diabetes control with goal A1c <7%.      Relevant Orders   Comprehensive metabolic panel     Endocrine   Hyperlipidemia associated with type 2 diabetes mellitus (HCC)    Chronic, ongoing.  Will continue Rosuvastatin daily and adjust dose as needed.  Lipid panel today.        Relevant Orders   Comprehensive metabolic panel   Lipid Panel w/o Chol/HDL Ratio  Insulin dependent type 2 diabetes mellitus (HCC) - Primary    Chronic, ongoing.  Urine ALB 28 November 2022.  A1c 5.7% last visit which is trended down from previous 6.7%, recheck today and adjust regimen as needed.  Pancreatitis presented with Mounjaro & Ozempic.  Had tolerated Ozempic well for years.  Uses Dexcom. - At this time will maintain off GLP1 or Mounjaro due to pancreatitis in past. - Continue Jardiance and Metformin. Continue Levemir 46 units, reduce this if morning BS consistently less then 130 or lows <70 present.  Continue Novolog pre-meals 5-12 units as needed only (not to take consistently), educated her on this and not to take if does not eat a meal or take after meals. - Dexcom continues.  Continue to work with CCM. - Check blood sugar 4-5 times a day and document for provider.   - Foot exam up to date.  Needs eye exam, but cost is concern. - Statin and ARB on board. - Flu shot and Pneumonia vaccines up to date. Return in 3 months.      Relevant Orders   HgB A1c     Other   Elevated LFTs   Relevant Orders    Comprehensive metabolic panel   Obesity    BMI 31.72.  Recommended eating smaller high protein, low fat meals more frequently and exercising 30 mins a day 5 times a week with a goal of 10-15lb weight loss in the next 3 months. Patient voiced their understanding and motivation to adhere to these recommendations.       Systolic murmur    Noted on exam.  Asymptomatic at this time.  Is scheduled to see cardiology upcoming.      Other Visit Diagnoses     Flu vaccine need       Flu vaccine due and provided in office today, educated patient and consent obtained.   Relevant Orders   Flu Vaccine Trivalent High Dose (Fluad) (Completed)        Follow up plan: Return in about 3 months (around 09/08/2023) for T2DM, HTN/HLD, ASTHMA, GERD.

## 2023-06-09 NOTE — Assessment & Plan Note (Signed)
Noted on exam.  Asymptomatic at this time.  Is scheduled to see cardiology upcoming.

## 2023-06-10 LAB — LIPID PANEL W/O CHOL/HDL RATIO
Cholesterol, Total: 114 mg/dL (ref 100–199)
HDL: 55 mg/dL (ref >39)
LDL Chol Calc (NIH): 42 mg/dL (ref 0–99)
Triglycerides: 85 mg/dL (ref 0–149)
VLDL Cholesterol Cal: 17 mg/dL (ref 5–40)

## 2023-06-10 LAB — COMPREHENSIVE METABOLIC PANEL WITH GFR
ALT: 56 [IU]/L — ABNORMAL HIGH (ref 0–32)
AST: 38 [IU]/L (ref 0–40)
Albumin: 4.5 g/dL (ref 3.8–4.8)
Alkaline Phosphatase: 54 [IU]/L (ref 44–121)
BUN/Creatinine Ratio: 26 (ref 12–28)
BUN: 20 mg/dL (ref 8–27)
Bilirubin Total: 0.4 mg/dL (ref 0.0–1.2)
CO2: 23 mmol/L (ref 20–29)
Calcium: 9.7 mg/dL (ref 8.7–10.3)
Chloride: 98 mmol/L (ref 96–106)
Creatinine, Ser: 0.76 mg/dL (ref 0.57–1.00)
Globulin, Total: 2.6 g/dL (ref 1.5–4.5)
Glucose: 72 mg/dL (ref 70–99)
Potassium: 4.2 mmol/L (ref 3.5–5.2)
Sodium: 138 mmol/L (ref 134–144)
Total Protein: 7.1 g/dL (ref 6.0–8.5)
eGFR: 84 mL/min/{1.73_m2}

## 2023-06-10 LAB — HEMOGLOBIN A1C
Est. average glucose Bld gHb Est-mCnc: 126 mg/dL
Hgb A1c MFr Bld: 6 % — ABNORMAL HIGH (ref 4.8–5.6)

## 2023-06-10 NOTE — Progress Notes (Signed)
Contacted via MyChart   Good afternoon Charlene Juarez, your labs have returned: - Kidney function, creatinine and eGFR, remains normal.  Liver function, AST and ALT, improving -- only mild elevation in ALT. - A1c 6%, try to cut back long acting insulin if you can -- just a little. - Cholesterol labs look great!!  Any questions? Keep being wonderful!!  Thank you for allowing me to participate in your care.  I appreciate you. Kindest regards, Jalaila Caradonna

## 2023-06-17 ENCOUNTER — Other Ambulatory Visit: Payer: Self-pay | Admitting: Nurse Practitioner

## 2023-06-17 NOTE — Telephone Encounter (Signed)
Requested Prescriptions  Refused Prescriptions Disp Refills   glipiZIDE (GLUCOTROL) 5 MG tablet [Pharmacy Med Name: GLIPIZIDE 5 MG TABLET] 90 tablet 4    Sig: TAKE 1 TABLET BY MOUTH DAILY BEFORE BREAKFAST.     Endocrinology:  Diabetes - Sulfonylureas Passed - 06/17/2023  1:35 AM      Passed - HBA1C is between 0 and 7.9 and within 180 days    HB A1C (BAYER DCA - WAIVED)  Date Value Ref Range Status  02/24/2023 5.7 (H) 4.8 - 5.6 % Final    Comment:             Prediabetes: 5.7 - 6.4          Diabetes: >6.4          Glycemic control for adults with diabetes: <7.0    Hgb A1c MFr Bld  Date Value Ref Range Status  06/09/2023 6.0 (H) 4.8 - 5.6 % Final    Comment:             Prediabetes: 5.7 - 6.4          Diabetes: >6.4          Glycemic control for adults with diabetes: <7.0          Passed - Cr in normal range and within 360 days    Creatinine, Ser  Date Value Ref Range Status  06/09/2023 0.76 0.57 - 1.00 mg/dL Final         Passed - Valid encounter within last 6 months    Recent Outpatient Visits           1 week ago Insulin dependent type 2 diabetes mellitus (HCC)   [place] [facility] [place], Dorie Rank, NP   1 month ago Tinnitus of both ears   [place] [facility] [place], [place] T, NP   3 months ago Insulin dependent type 2 diabetes mellitus (HCC)   [place] [facility] [place], Corrie Dandy T, NP   6 months ago Medicare annual wellness visit, subsequent   [place] [facility] [place], F[place] T, NP   8 months ago Insulin dependent type 2 diabetes mellitus (HCC)   [place] [facility] [place], Dorie Rank, NP       Future Appointments             In 1 month Gollan, Tollie Pizza, MD  [facility] [place]   In 2 months Cannady, Dorie Rank, NP  [facility], [facility]

## 2023-07-15 ENCOUNTER — Encounter: Payer: Self-pay | Admitting: Nurse Practitioner

## 2023-07-16 ENCOUNTER — Other Ambulatory Visit: Payer: Self-pay | Admitting: Nurse Practitioner

## 2023-07-16 ENCOUNTER — Other Ambulatory Visit: Payer: Self-pay | Admitting: Gastroenterology

## 2023-07-18 NOTE — Telephone Encounter (Signed)
Requested Prescriptions  Refused Prescriptions Disp Refills   glipiZIDE (GLUCOTROL) 5 MG tablet [Pharmacy Med Name: GLIPIZIDE 5 MG TABLET] 90 tablet 4    Sig: TAKE 1 TABLET BY MOUTH DAILY BEFORE BREAKFAST.     Endocrinology:  Diabetes - Sulfonylureas Passed - 07/16/2023  8:28 AM      Passed - HBA1C is between 0 and 7.9 and within 180 days    HB A1C (BAYER DCA - WAIVED)  Date Value Ref Range Status  02/24/2023 5.7 (H) 4.8 - 5.6 % Final    Comment:             Prediabetes: 5.7 - 6.4          Diabetes: >6.4          Glycemic control for adults with diabetes: <7.0    Hgb A1c MFr Bld  Date Value Ref Range Status  06/09/2023 6.0 (H) 4.8 - 5.6 % Final    Comment:             Prediabetes: 5.7 - 6.4          Diabetes: >6.4          Glycemic control for adults with diabetes: <7.0          Passed - Cr in normal range and within 360 days    Creatinine, Ser  Date Value Ref Range Status  06/09/2023 0.76 0.57 - 1.00 mg/dL Final         Passed - Valid encounter within last 6 months    Recent Outpatient Visits           1 month ago Insulin dependent type 2 diabetes mellitus (HCC)   [place] [facility] [place], Corrie Dandy T, NP   2 months ago Tinnitus of both ears   [place] [facility] [place], [place] T, NP   4 months ago Insulin dependent type 2 diabetes mellitus (HCC)   [place] [facility] [place], Corrie Dandy T, NP   7 months ago Medicare annual wellness visit, subsequent   [place] [facility] [place], Alb[place] T, NP   9 months ago Insulin dependent type 2 diabetes mellitus (HCC)   [place] [facility] [place], Dorie Rank, NP       Future Appointments             In 2 weeks Gollan, Tollie Pizza, MD [place] [facility] [place]   In 1 month Cannady, Dorie Rank, NP [place] [facility], [facility]             JARDIANCE 25 MG TABS tablet [Pharmacy Med Name: JARDIANCE 25 MG TABLET] 90 tablet 0     Sig: TAKE 1 TABLET (25 MG TOTAL) BY MOUTH DAILY.     Endocrinology:  Diabetes - SGLT2 Inhibitors Passed - 07/16/2023  8:28 AM      Passed - Cr in normal range and within 360 days    Creatinine, Ser  Date Value Ref Range Status  06/09/2023 0.76 0.57 - 1.00 mg/dL Final         Passed - HBA1C is between 0 and 7.9 and within 180 days    HB A1C (BAYER DCA - WAIVED)  Date Value Ref Range Status  02/24/2023 5.7 (H) 4.8 - 5.6 % Final    Comment:             Prediabetes: 5.7 - 6.4          Diabetes: >6.4  Glycemic control for adults with diabetes: <7.0    Hgb A1c MFr Bld  Date Value Ref Range Status  06/09/2023 6.0 (H) 4.8 - 5.6 % Final    Comment:             Prediabetes: 5.7 - 6.4          Diabetes: >6.4          Glycemic control for adults with diabetes: <7.0          Passed - eGFR in normal range and within 360 days    GFR calc Af Amer  Date Value Ref Range Status  05/08/2020 95 >59 mL/min/1.73 Final    Comment:    **[facility] currently reports eGFR in compliance with the current**   recommendations of the [employer]. [facility] will   update reporting as new guidelines are published from the NKF-ASN   Task force.    GFR, Estimated  Date Value Ref Range Status  05/24/2022 >60 >60 mL/min Final    Comment:    (NOTE) Calculated using the CKD-EPI Creatinine Equation (2021)    eGFR  Date Value Ref Range Status  06/09/2023 84 >59 mL/min/1.73 Final         Passed - Valid encounter within last 6 months    Recent Outpatient Visits           1 month ago Insulin dependent type 2 diabetes mellitus (HCC)   [place] [facility] [place], Hancock T, NP   2 months ago Tinnitus of both ears   [place] [facility] [place], [place] T, NP   4 months ago Insulin dependent type 2 diabetes mellitus (HCC)   [place] [facility] [place], Corrie Dandy T, NP   7 months ago Medicare annual wellness visit, subsequent   [facility] [place], [place] T, NP   9 months ago Insulin dependent type 2 diabetes mellitus (HCC)   [place] [facility] [place], Dorie Rank, NP       Future Appointments             In 2 weeks Gollan, Tollie Pizza, MD [place] [facility] [place]   In 1 month Cannady, Dorie Rank, NP [place] [facility], [facility]             MOUNJARO 7.5 MG/0.5ML Pen [Pharmacy Med Name: MOUNJARO 7.5 MG/0.5 ML PEN]  4    Sig: INJECT 7.5 MG SUBCUTANEOUSLY WEEKLY     Off-Protocol Failed - 07/16/2023  8:28 AM      Failed - Medication not assigned to a protocol, review manually.      Passed - Valid encounter within last 12 months    Recent Outpatient Visits           1 month ago Insulin dependent type 2 diabetes mellitus (HCC)   [place] [facility] [place], Tom[place] T, NP   2 months ago Tinnitus of both ears   [facility] [place], [place] T, NP   4 months ago Insulin dependent type 2 diabetes mellitus (HCC)   [place] [facility] [place], Corrie Dandy T, NP   7 months ago Medicare annual wellness visit, subsequent   [place] [facility] [place], [place] T, NP   9 months ago Insulin dependent type 2 diabetes mellitus (HCC)   [place] [facility] Marjie Skiff, NP       Future Appointments  In 2 weeks Gollan, Tollie Pizza, MD [place] [facility] [place]   In 1 month [place], Dorie Rank, NP [place] [facility], [facility]             rosuvastatin (CRESTOR) 20 MG tablet [Pharmacy Med Name: ROSUVASTATIN CALCIUM 20 MG TAB] 90 tablet 0    Sig: TAKE 1 TABLET BY MOUTH EVERY DAY     Cardiovascular:  Antilipid - Statins 2 Failed - 07/16/2023  8:28 AM      Failed - Lipid Panel in normal range within the last 12 months    Cholesterol, Total  Date Value Ref Range Status  06/09/2023 114 100 - 199 mg/dL Final   LDL Chol Calc (NIH)  Date  Value Ref Range Status  06/09/2023 42 0 - 99 mg/dL Final   HDL  Date Value Ref Range Status  06/09/2023 55 >39 mg/dL Final   Triglycerides  Date Value Ref Range Status  06/09/2023 85 0 - 149 mg/dL Final         Passed - Cr in normal range and within 360 days    Creatinine, Ser  Date Value Ref Range Status  06/09/2023 0.76 0.57 - 1.00 mg/dL Final         Passed - Patient is not pregnant      Passed - Valid encounter within last 12 months    Recent Outpatient Visits           1 month ago Insulin dependent type 2 diabetes mellitus (HCC)   [place] [facility] [place], Corrie Dandy T, NP   2 months ago Tinnitus of both ears   [place] [facility] [place], Addison T, NP   4 months ago Insulin dependent type 2 diabetes mellitus (HCC)   [place] [facility] [place], Dorie Rank, NP   7 months ago Medicare annual wellness visit, subsequent   [place] [facility] [place], [place] T, NP   9 months ago Insulin dependent type 2 diabetes mellitus (HCC)   [place] [facility] [place], Dorie Rank, NP       Future Appointments             In 2 weeks Gollan, Tollie Pizza, MD [place] [facility] [place]   In 1 month [place], Dorie Rank, NP [place] [facility], [facility]

## 2023-07-24 ENCOUNTER — Other Ambulatory Visit: Payer: Self-pay | Admitting: Nurse Practitioner

## 2023-07-25 NOTE — Telephone Encounter (Signed)
Requested medication (s) are due for refill today: yes  Requested medication (s) are on the active medication list:historical med  Last refill:  11/01/22  Future visit scheduled: yes  Notes to clinic:  historical provider   Requested Prescriptions  Pending Prescriptions Disp Refills   TRESIBA FLEXTOUCH 100 UNIT/ML FlexTouch Pen [Pharmacy Med Name: TRESIBA FLEXTOUCH 100 UNIT/ML]  8    Sig: INJECT 50 UNITS INTO THE SKIN AT BEDTIME.     Endocrinology:  Diabetes - Insulins Passed - 07/24/2023  8:48 AM      Passed - HBA1C is between 0 and 7.9 and within 180 days    HB A1C (BAYER DCA - WAIVED)  Date Value Ref Range Status  02/24/2023 5.7 (H) 4.8 - 5.6 % Final    Comment:             Prediabetes: 5.7 - 6.4          Diabetes: >6.4          Glycemic control for adults with diabetes: <7.0    Hgb A1c MFr Bld  Date Value Ref Range Status  06/09/2023 6.0 (H) 4.8 - 5.6 % Final    Comment:             Prediabetes: 5.7 - 6.4          Diabetes: >6.4          Glycemic control for adults with diabetes: <7.0          Passed - Valid encounter within last 6 months    Recent Outpatient Visits           1 month ago Insulin dependent type 2 diabetes mellitus (HCC)   [place] [facility] [place], [place] T, NP   2 months ago Tinnitus of both ears   [place] [facility] [place], [place] T, NP   5 months ago Insulin dependent type 2 diabetes mellitus (HCC)   [place] [facility] [place], Dorie Rank, NP   8 months ago Medicare annual wellness visit, subsequent   [place] [facility] [place], [place] T, NP   10 months ago Insulin dependent type 2 diabetes mellitus (HCC)   [place] [facility] [place], Dorie Rank, NP       Future Appointments             In 1 week Gollan, Tollie Pizza, MD [place] [facility] [employer]   In 1 month [place], Dorie Rank, NP [place] [facility], [facility]

## 2023-07-26 ENCOUNTER — Telehealth: Payer: Self-pay | Admitting: *Deleted

## 2023-07-26 NOTE — Telephone Encounter (Signed)
 LMOVM to verify card hx

## 2023-07-31 NOTE — Progress Notes (Unsigned)
Cardiology Office Note  Date:  08/01/2023   ID:  Charlene Juarez, DOB 1950/11/08, MRN [id number]  PCP:  Charlene Skiff, NP   Chief Complaint  Patient presents with   New Patient (Initial Visit)    Ref by Charlene Dials, NP for palpitations. Patient c/o shortness of breath with exertion and palpitations. Medications reviewed by the patient verbally.     HPI:  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, required adenosine GERD Hyperlipidemia Mild aortic atherosclerosis on CT scan September 2023 Who presents by referral from [facility] for palpitations  Active at baseline Reports that in the summer when it is hot especially if she overdoes it, she will have paroxysmal tachycardia  Feels it is similar to prior SVT which she had in 2010 requiring adenosine  In the cooler weather, less tachycardia spells Has been less active in the cooler weather, doing more crocheting  Denies chest pain concerning for angina No significant lower extremity edema  Echo pending, ordered in August No event monitor available  CT scan abdomen pelvis September 2023 images pulled up and reviewed Mild aortic atherosclerosis  Lab work reviewed A1c 6.0 down from 8.3 Total cholesterol 114 LDL 42 on Crestor  EKG personally reviewed by myself on todays visit EKG Interpretation Date/Time:  Monday August 01 2023 11:18:08 EST Ventricular Rate:  80 PR Interval:  176 QRS Duration:  96 QT Interval:  408 QTC Calculation: 470 R Axis:   -1  Text Interpretation: Normal sinus rhythm Cannot rule out Inferior infarct , age undetermined When compared with ECG of 23-May-2022 00:55, No significant change was found Confirmed by Charlene Juarez [phone]) on 08/01/2023 11:24:41 AM    PMH:   has a past medical history of Arthritis, Bursitis, Cancer (HCC), Diabetes mellitus without complication (HCC), GERD (gastroesophageal  reflux disease), Hyperlipidemia, Hypertension, and Sleep apnea.  PSH:    Past Surgical History:  Procedure Laterality Date   cancer removal off nose     CATARACT EXTRACTION W/PHACO Right 01/13/2017   Procedure: CATARACT EXTRACTION PHACO AND INTRAOCULAR LENS PLACEMENT (IOC);  Surgeon: Charlene Crane, MD;  Location: [facility] ORS;  Service: Ophthalmology;  Laterality: Right;  [place] 31.1 AP% 0.0 CDE 1.78 Fluid Pack lot # [id number]   CATARACT EXTRACTION W/PHACO Left 02/24/2017   Procedure: CATARACT EXTRACTION PHACO AND INTRAOCULAR LENS PLACEMENT (IOC);  Surgeon: Charlene Crane, MD;  Location: [facility] ORS;  Service: Ophthalmology;  Laterality: Left;  [place]  00:45.5 AP  0.7 CDE   2.76  fluid pack lot # [id number]  exp. 08-19-2018   COLONOSCOPY WITH PROPOFOL N/A 12/09/2022   Procedure: COLONOSCOPY WITH PROPOFOL;  Surgeon: Charlene Mood, MD;  Location: [facility] ENDOSCOPY;  Service: Gastroenterology;  Laterality: N/A;   DILATION AND CURETTAGE OF UTERUS     ESOPHAGOGASTRODUODENOSCOPY (EGD) WITH PROPOFOL N/A 01/06/2022   Procedure: ESOPHAGOGASTRODUODENOSCOPY (EGD) WITH PROPOFOL;  Surgeon: Charlene Mood, MD;  Location: [facility] ENDOSCOPY;  Service: Gastroenterology;  Laterality: N/A;   EYE SURGERY     rotator cuff surgery     TUBAL LIGATION      Current Outpatient Medications  Medication Sig Dispense Refill   albuterol (VENTOLIN HFA) 108 (90 Base) MCG/ACT inhaler TAKE 2 PUFFS BY MOUTH EVERY 6 HOURS AS NEEDED FOR WHEEZE OR SHORTNESS OF BREATH 18 each 2   Ascorbic Acid (VITAMIN C) 1000 MG tablet Take 1,000 mg by mouth daily.     Cholecalciferol (VITAMIN D3) 1000 units CAPS  Take 1,000 Units by mouth at bedtime.     Continuous Blood Gluc Receiver (DEXCOM G7 RECEIVER) DEVI For continuous blood sugar monitoring as injecting insulin 4 times a day and over age 43.  Dx E11.9, Z79.4 1 each 12   Continuous Blood Gluc Sensor (DEXCOM G7 SENSOR) MISC For continuous blood sugar monitoring as injecting insulin 4 times a day and over  age 66. Dx E11.9, Z79.4 1 each 12   CONTOUR TEST test strip USE TO TEST BLOOD SUGAR ONCE A DAY == DX E11.69 100 strip 4   gabapentin (NEURONTIN) 100 MG capsule Take 1 capsule (100 mg total) by mouth 3 (three) times daily. 90 capsule 12   Ginkgo Biloba 40 MG TABS Take 1 tablet by mouth daily.     hydrochlorothiazide (HYDRODIURIL) 25 MG tablet Take 1 tablet (25 mg total) by mouth daily. 90 tablet 3   insulin aspart (NOVOLOG FLEXPEN) 100 UNIT/ML FlexPen Inject 10-12 Units into the skin 3 (three) times daily with meals. 15 mL 11   insulin degludec (TRESIBA FLEXTOUCH) 100 UNIT/ML FlexTouch Pen INJECT 50 UNITS INTO THE SKIN AT BEDTIME. 9 mL 8   Insulin Pen Needle 31G X 8 MM MISC Use one needle daily to inject insulin 4-5 times a day. 300 each 4   JARDIANCE 25 MG TABS tablet TAKE 1 TABLET (25 MG TOTAL) BY MOUTH DAILY. 90 tablet 0   Magnesium 500 MG TABS Take 500 mg by mouth at bedtime.     metFORMIN (GLUCOPHAGE) 1000 MG tablet TAKE 1 TABLET (1,000 MG TOTAL) BY MOUTH TWICE A DAY WITH FOOD 180 tablet 1   Omega-3 Fatty Acids (FISH OIL) 1200 MG CAPS Take 1,200 mg by mouth daily.     pantoprazole (PROTONIX) 20 MG tablet Take 1 tablet (20 mg total) by mouth 2 (two) times daily. 180 tablet 0   polyethylene glycol (MIRALAX / GLYCOLAX) 17 g packet Take 17 g by mouth daily as needed for moderate constipation. 14 each 0   rosuvastatin (CRESTOR) 20 MG tablet TAKE 1 TABLET BY MOUTH EVERY DAY 90 tablet 0   SYMBICORT 160-4.5 MCG/ACT inhaler TAKE 2 PUFFS BY MOUTH TWICE A DAY 30.6 each 3   telmisartan (MICARDIS) 80 MG tablet TAKE 1 TABLET BY MOUTH EVERY DAY 90 tablet 1   traZODone (DESYREL) 50 MG tablet TAKE 1-2 TABLETS BY MOUTH AT BEDTIME AS NEEDED FOR SLEEP. 180 tablet 3   Continuous Blood Gluc Receiver (DEXCOM G7 RECEIVER) DEVI For continuous blood sugar monitoring as injecting insulin 4 times a day and over age 62. Dx E11.9, Z79.4 1 each 12   No current facility-administered medications for this visit.      Allergies:   Mounjaro [tirzepatide], Ozempic (0.25 or 0.5 mg-dose) [semaglutide(0.25 or 0.5mg -dos)], Codeine sulfate, and Lisinopril   Social History:  The patient  reports that she has never smoked. She has never used smokeless tobacco. She reports that she does not drink alcohol and does not use drugs.   Family History:   family history includes Alcohol abuse in her father and mother; Aneurysm in her sister; Breast cancer in her maternal aunt; Diabetes in her brother, maternal grandmother, sister, sister, and sister; Heart disease in her father; Hyperlipidemia in her sister; Hypertension in her brother and sister; Thyroid disease in her daughter.    Review of Systems: Review of Systems  Constitutional: Negative.   HENT: Negative.    Respiratory: Negative.    Cardiovascular:  Positive for palpitations.  Gastrointestinal: Negative.   Musculoskeletal:  Negative.   Neurological: Negative.   Psychiatric/Behavioral: Negative.    All other systems reviewed and are negative.   PHYSICAL EXAM: VS:  BP 124/60 (BP Location: Right Arm, Patient Position: Sitting, Cuff Size: Normal)   Pulse 80   Ht 5\' 2"  (1.575 m)   Wt 178 lb 8 oz (81 kg)   LMP 07/13/2000 (Approximate)   SpO2 97%   BMI 32.65 kg/m  , BMI Body mass index is 32.65 kg/m. Constitutional:  oriented to person, place, and time. No distress.  HENT:  Head: Grossly normal Eyes:  no discharge. No scleral icterus.  Neck: No JVD, no carotid bruits  Cardiovascular: Regular rate and rhythm, no murmurs appreciated Pulmonary/Chest: Clear to auscultation bilaterally, no wheezes or rails Abdominal: Soft.  no distension.  no tenderness.  Musculoskeletal: Normal range of motion Neurological:  normal muscle tone. Coordination normal. No atrophy Skin: Skin warm and dry Psychiatric: normal affect, pleasant  Recent Labs: 11/24/2022: Hemoglobin 14.1; Magnesium 2.1; Platelets 208 12/10/2022: TSH 1.690 06/09/2023: ALT 56; BUN 20; Creatinine,  Ser 0.76; Potassium 4.2; Sodium 138    Lipid Panel Lab Results  Component Value Date   CHOL 114 06/09/2023   HDL 55 06/09/2023   LDLCALC 42 06/09/2023   TRIG 85 06/09/2023     Wt Readings from Last 3 Encounters:  08/01/23 178 lb 8 oz (81 kg)  06/09/23 173 lb 6.4 oz (78.7 kg)  05/12/23 170 lb 9.6 oz (77.4 kg)     ASSESSMENT AND PLAN:  Problem List Items Addressed This Visit       Cardiology Problems   Hypertension associated with diabetes (HCC) - Primary   Relevant Orders   EKG 12-Lead (Completed)   Hyperlipidemia associated with type 2 diabetes mellitus (HCC)     Other   Systolic murmur   Relevant Orders   EKG 12-Lead (Completed)   Intermittent palpitations   Relevant Orders   EKG 12-Lead (Completed)   Paroxysmal tachycardia/SVT Prior history SVT requiring adenosine 2010, EKGs in the system were reviewed Rate at that time 190 bpm Reports having paroxysmal tachycardia of unclear etiology worse in the hot weather and when she overdoes it Reports symptoms similar to when she had SVT though only lasting several minutes at a time Recommend Zio monitor for further evaluation, need to exclude atrial fibrillation Echocardiogram ordered by primary care Recommend she take diltiazem 60 mg as needed for prolonged episodes of tachycardia For episodes of SVT on Zio monitor, potentially could use long-acting beta-blockers versus calcium channel blockers  Murmur Likely aortic valve sclerosis without significant stenosis Echocardiogram pending  Essential hypertension Blood pressure is well controlled on today's visit. No changes made to the medications.  Diabetes type 2 A1c significantly improved down to 6.0  Hyperlipidemia Cholesterol is at goal on the current lipid regimen. No changes to the medications were made.   Signed, Dossie Arbour, M.D., Ph.D. [facility] [place], [place] [phone]

## 2023-08-01 ENCOUNTER — Encounter: Payer: Self-pay | Admitting: Cardiovascular Disease

## 2023-08-01 ENCOUNTER — Ambulatory Visit: Payer: Medicare Other | Attending: Cardiovascular Disease

## 2023-08-01 ENCOUNTER — Ambulatory Visit: Payer: Medicare Other | Attending: Cardiovascular Disease | Admitting: Cardiovascular Disease

## 2023-08-01 VITALS — BP 124/60 | HR 80 | Ht 62.0 in | Wt 178.5 lb

## 2023-08-01 DIAGNOSIS — I152 Hypertension secondary to endocrine disorders: Secondary | ICD-10-CM

## 2023-08-01 DIAGNOSIS — E1169 Type 2 diabetes mellitus with other specified complication: Secondary | ICD-10-CM | POA: Diagnosis not present

## 2023-08-01 DIAGNOSIS — E785 Hyperlipidemia, unspecified: Secondary | ICD-10-CM | POA: Diagnosis not present

## 2023-08-01 DIAGNOSIS — I471 Supraventricular tachycardia, unspecified: Secondary | ICD-10-CM

## 2023-08-01 DIAGNOSIS — E1159 Type 2 diabetes mellitus with other circulatory complications: Secondary | ICD-10-CM

## 2023-08-01 DIAGNOSIS — R002 Palpitations: Secondary | ICD-10-CM

## 2023-08-01 DIAGNOSIS — I479 Paroxysmal tachycardia, unspecified: Secondary | ICD-10-CM | POA: Diagnosis not present

## 2023-08-01 DIAGNOSIS — R011 Cardiac murmur, unspecified: Secondary | ICD-10-CM | POA: Diagnosis not present

## 2023-08-01 MED ORDER — DILTIAZEM HCL 60 MG PO TABS: 60.0000 mg | ORAL_TABLET | Freq: Three times a day (TID) | ORAL | 3 refills | Status: DC | PRN

## 2023-08-01 NOTE — Patient Instructions (Signed)
Medication Instructions:  Diltiazem 60 mg pill up to three times a day as needed for tachycardia  If you need a refill on your cardiac medications before your next appointment, please call your pharmacy.   Lab work: No new labs needed  Testing/Procedures: Heart Monitor:  Your physician has requested you wear a ZIO monitor for 14  days.  Your monitor will be mailed to your home address within 3-5 business days. This is sent via [employer] from [employer]. However, if you have not received your monitor after 5 business days please send [place] a MyChart message or call the office at [phone], so we may follow up on this for you.   This monitor is a medical device (single patch monitor) that records the heart's electrical activity. Doctors most often use these monitors to diagnose arrhythmias. Arrhythmias are problems with the speed or rhythm of the heartbeat.   [employer] supplies 1 patch per enrollment. Additional stickers are not available.  Please DO NOT apply the patch if you will be having a Nuclear Stress Test, Echocardiogram, Cardiac CT, Cardiac MRI, Chest X-ray during the period you would be wearing the monitor. The patch cannot be worn during these tests.  You cannot remove and re-apply the ZIO patch monitor.   Applying the Monitor: Once you receive your monitor, this will include a small razor, abrader, and 4 alcohol pads. Shave hair from upper left chest Rub abrader disc in 40 strokes over the left upper chest as indicated in your monitor instructions Clean area with 4 enclosed alcohol pads (there may be a mild & brief stinging sensation over the newly abraded area, but this is normal). Let dry Apply patch as indicated in monitor instructions. Patch will be placed under collarbone on the left side of the chest with arrow pointing upward. Rub adhesive wings for 2 minutes. Remove white label marked "1". Remove the white label marked "2". Rub patch adhesive wings for an 2  minutes.  While looking in a mirror, press and release button in the center of the patch. You may hear a "click". A small green light will flash 4-6 times and then stop. This will be your indicator that the monitor has been turned on.  Wearing the Monitor: Avoid showering during the first 24 hours of wearing the monitor.  After 24 hours you may shower with the patch on. Take brief showers with your back facing the shower head.  Avoid excessive sweating to help maximize wear time. Do not submerge the device, no hot tubs, and no swimming pools. Keep any lotions or oils away from the patch. Press the button if you feel a symptom. You will hear a small click. Record date, time, and symptoms in the Patient Logbook or App.  Monitor Issues: Call [employer] Customer Care at [phone] if you have questions regarding your Zio Patch Monitor. Call them immediately if you see an orange/ amber colored light blinking on your monitor. If your monitor falls off and you cannot get this reapplied or if you need suggestions for securing your monitor call [employer] at [phone].   Returning the Monitor: Once you have completed wearing your monitor, follow instructions on the last 2 pages of the Patient Logbook. Stick monitor patch on to the last page of the Patient Logbook.  Place Patient Logbook with monitor in the return box provided. Use locking tab on box and tape box closed securely. The return box has pre-paid postage on it.  Place the return  box in the regular [place] Mail box as soon as possible It will take anywhere from 1-2 weeks for your provider to receive and review your results once you mail this back. If for some reason you have misplaced your return box then call our office and we can provide another box and/or mail it off for you.   Billing  and Patient Assistance Program Information: We have supplied [employer] with any of your insurance information on file for billing  purposes. [employer] offers a sliding scale Patient Assistance Program for patients that do not have insurance, or whose insurance does not completely cover the cost of the ZIO monitor. You must apply for the Patient Assistance Program to qualify for this discounted rate. To apply, please call [employer] at [phone], select option 1, ask to apply for the Patient Assistance Program. [employer] will ask your household income, and how many people are in your household. They will quote your out-of-pocket cost based on that information. [employer] will also be able to set up for a 63-month, interest-free payment plan if needed.     Follow-Up: At [facility], you and your health needs are our priority.  As part of our continuing mission to provide you with exceptional heart care, we have created designated Provider Care Teams.  These Care Teams include your primary Cardiologist (physician) and Advanced Practice Providers (APPs -  Physician Assistants and Nurse Practitioners) who all work together to provide you with the care you need, when you need it.  You will need a follow up appointment as needed  Providers on your designated Care Team:   Nicolasa Ducking, NP Eula Listen, PA-C Cadence Fransico Michael, [place]  COVID-19 Vaccine Information can be found at: [email or web address] For questions related to vaccine distribution or appointments, please email [email or web address] or call [phone].

## 2023-08-02 ENCOUNTER — Ambulatory Visit: Payer: Medicare Other | Attending: Nurse Practitioner

## 2023-08-02 DIAGNOSIS — R011 Cardiac murmur, unspecified: Secondary | ICD-10-CM | POA: Insufficient documentation

## 2023-08-02 LAB — ECHOCARDIOGRAM COMPLETE
AR max vel: 1.76 cm2
AV Area VTI: 1.72 cm2
AV Area mean vel: 1.76 cm2
AV Mean grad: 7 mm[Hg]
AV Peak grad: 12.3 mm[Hg]
Ao pk vel: 1.75 m/s
Area-P 1/2: 3.53 cm2
Calc EF: 53.4 %
S' Lateral: 3.1 cm
Single Plane A2C EF: 53.4 %
Single Plane A4C EF: 54 %

## 2023-08-02 NOTE — Progress Notes (Signed)
Contacted via MyChart   Good evening Charlene Juarez, your echo has returned and overall pump is good.  Your left ventricle is enlarged some, this can be seen with high blood pressure and chronic disease processes.  For this we want to keep blood pressure under control.  Overall valves are stable with exception of some plaque build up on aortic valve.  Continue statin daily.  Any questions? Keep being amazing!!  Thank you for allowing me to participate in your care.  I appreciate you. Kindest regards, Joene Gelder

## 2023-08-03 DIAGNOSIS — E119 Type 2 diabetes mellitus without complications: Secondary | ICD-10-CM | POA: Diagnosis not present

## 2023-08-03 DIAGNOSIS — Z961 Presence of intraocular lens: Secondary | ICD-10-CM | POA: Diagnosis not present

## 2023-08-03 LAB — HM DIABETES EYE EXAM

## 2023-08-04 ENCOUNTER — Encounter: Payer: Self-pay | Admitting: Nurse Practitioner

## 2023-08-04 DIAGNOSIS — I479 Paroxysmal tachycardia, unspecified: Secondary | ICD-10-CM

## 2023-08-04 DIAGNOSIS — I471 Supraventricular tachycardia, unspecified: Secondary | ICD-10-CM | POA: Diagnosis not present

## 2023-08-04 MED ORDER — DILTIAZEM HCL 60 MG PO TABS: 60.0000 mg | ORAL_TABLET | Freq: Three times a day (TID) | ORAL | 3 refills | Status: DC | PRN

## 2023-08-10 ENCOUNTER — Other Ambulatory Visit: Payer: Medicare Other

## 2023-08-10 ENCOUNTER — Ambulatory Visit: Payer: Self-pay | Admitting: *Deleted

## 2023-08-10 NOTE — Patient Outreach (Signed)
Care Coordination   Follow Up Visit Note   08/10/2023 Name: Charlene Juarez MRN: [id number] DOB: 02/13/1951  Charlene Juarez is a 72 y.o. year old female who sees Aura Dials T, NP for primary care. I spoke with  [name] by phone today.  What matters to the patients health and wellness today?  Decrease weight and keep chronic conditions managed. Does not wish to have further calls, but state she will call with any questions/concerns.    Goals Addressed             This Visit's Progress    COMPLETED: RNCM  Care Management Expected Outcome:  Monitor, Self-Manage and Reduce Symptoms of Diabetes   On track    Current Barriers:  Knowledge Deficits related to diabetes management education and benefits of controlled DM  Care Coordination needs related to denial of Dexcom and reordering of Dexcom meter, may need pharm D support in the future in a patient with DM  Chronic Disease Management support and education needs related to effective management of DM Lab Results  Component Value Date   HGBA1C 6.0 (H) 06/09/2023    Planned Interventions: Provided education to patient about basic DM disease process. The patient is doing well with the management of her DM. Her most recent A1C lower than previous and she is doing well with the current plan of care.  Denies any acute findings related to her DM. The patient is doing well with the management of her DM.  Reviewed medications with patient and discussed importance of medication adherence. The patient is compliant with her medications. She is on the Jardiance,  Metformin,  and Levimir for effective management of her DM. The patient cannot tolerate Mounjaro or Ozempic. The patient is doing well with the current plan of care. A1C is at target.  Reviewed prescribed diet with patient heart healthy/ADA diet. Discussed making sure she eats a light snack at bedtime to help with regulation of sugars during sleep times.  She is eating snacks at bedtime and this is helping keep her blood sugars stable during the night; Counseled on importance of regular laboratory monitoring as prescribed. The patient is under goal with A1C and is thankful for this. Review of maintaining good control of her blood sugars.    Discussed plans with patient for ongoing care management follow up and provided patient with direct contact information for care management team;      Provided patient with written educational materials related to hypo and hyperglycemia and importance of correct treatment. She is using the Dexcom. The patient knows to call for any acute changes in her blood glucose readings. The patient knows how to effectively manage her hypo and hyperglycemia. The lowest she has seen is 59. Recommendations provided.     Reviewed scheduled/upcoming provider appointments including: 06-09-2023 with the pcp.        Advised patient, providing education and rationale, to check cbg when you have symptoms of low or high blood sugar and has a continuous glucose reader at this time  and record. The patient has her Dexcom G-7 and it is doing well for her. She is happy she has that.  call provider for findings outside established parameters;       Review of patient status, including review of consultants reports, relevant laboratory and other test results, and medications completed;       Advised patient to discuss changes in DM health and well being with provider;  Screening for signs and symptoms of depression related to chronic disease state;        Assessed social determinant of health barriers;    The patient is having some tinnitus in her ears that she has noticed more recently. She has a sound machine and that helps a lot most of the time but not all of the time. Will send information to the patient about the Epley maneuver to see if this possibly may help with her tinnitus she is experiencing. She states this has been going on for a  while. Denies any safety concerns. She is having issues with sleep patterns at times with the tinnitus. States melatonin and other OTC products  are not helpful. The patient states that the pcp does not want to give her Ambien. Discussed the risk of Ambien. The patient has Trazadone but she states this is not helpful for her. An inbasket message sent to the pcp for recommendations. The patient is open for recommendations on helping with her insomnia. Symptom Management: Take medications as prescribed   Attend all scheduled provider appointments Call provider office for new concerns or questions  call the Suicide and Crisis Lifeline: 988 call the [place] National Suicide Prevention Lifeline: 580 637 3000 or TTY: (514) 009-0424 TTY [phone]) to talk to a trained counselor call 1-800-273-TALK (toll free, 24 hour hotline) if experiencing a Mental Health or Behavioral Health Crisis  keep appointment with eye doctor check feet daily for cuts, sores or redness trim toenails straight across manage portion size wash and dry feet carefully every day wear comfortable, cotton socks wear comfortable, well-fitting shoes  Follow Up Plan: Telephone follow up appointment with care management team member scheduled for: 08-10-2023 at 9am       COMPLETED: [facility] Care Management Expected Outcome:  Monitor, Self-Manage and Reduce Symptoms of: HLD   On track    Current Barriers:  Chronic Disease Management support and education needs related to for effective management of HLD Lab Results  Component Value Date   CHOL 114 06/09/2023   HDL 55 06/09/2023   LDLCALC 42 06/09/2023   TRIG 85 06/09/2023     Planned Interventions: Provider established cholesterol goals reviewed. The patient is at goal and doing well with management of her HLD. Doing well. Denies any acute changes in her heart health Education and support given. ; Counseled on importance of regular laboratory monitoring as prescribed. Has labs on a  regular basis.; Provided HLD educational materials; Reviewed role and benefits of statin for ASCVD risk reduction; Discussed strategies to manage statin-induced myalgias; Reviewed importance of limiting foods high in cholesterol; Reviewed exercise goals and target of 150 minutes per week. Is active in her garden and busy during the summer with her family. States she feels good and is doing well. Loves to crochet and make things. ; Screening for signs and symptoms of depression related to chronic disease state;  Assessed social determinant of health barriers;   Symptom Management: Take medications as prescribed   Attend all scheduled provider appointments Call provider office for new concerns or questions  call the Suicide and Crisis Lifeline: 988 call the [place] National Suicide Prevention Lifeline: (505) 023-5286 or TTY: 450-400-3149 TTY 347-588-3344) to talk to a trained counselor call 1-800-273-TALK (toll free, 24 hour hotline) if experiencing a Mental Health or Behavioral Health Crisis  - call for medicine refill 2 or 3 days before it runs out - take all medications exactly as prescribed - call doctor with any symptoms you believe are related to  your medicine - call doctor when you experience any new symptoms - go to all doctor appointments as scheduled - adhere to prescribed diet: heart healthy/ADA diet  - develop an exercise routine  Follow Up Plan: Telephone follow up appointment with care management team member scheduled for: 08-10-2023 at 0900 am       COMPLETED: RNCM Care Management:  Expected Outcome:  Monitor, Self-Manage, and Reduce Symptoms of Hypertension   On track    Current Barriers:  Knowledge Deficits related to the benefits for taking blood pressures on a regular basis and recording Chronic Disease Management support and education needs related to effective management of HTN BP Readings from Last 3 Encounters:  08/01/23 124/60  06/09/23 115/65  05/12/23 136/70      Planned Interventions: Evaluation of current treatment plan related to hypertension self management and patient's adherence to plan as established by provider. The patient is doing well with management of her blood pressures and her blood pressures are stable at this time.  Denies any acute changes in her HTN or heart health. Blood pressures are stable;   Provided education to patient re: stroke prevention, s/s of heart attack and stroke. Education and support; Reviewed prescribed diet heart healthy/ADA diet. The patient is mindful of her dietary intake. The patient watches what she eats. She is compliant with dietary restrictions.   Reviewed medications with patient and discussed importance of compliance. The patient is compliant with medications. Review of pharm D support and to let the [facility] know if she needs support from pharm D.   Counseled on the importance of exercise goals with target of 150 minutes per week. The patient is active and loves to garden and do arrangements with flowers. Education on walking at least 15 minutes at a moderate pace can improve blood sugars, help with heart function and circulation. Encouraged increased activity.  Discussed plans with patient for ongoing care management follow up and provided patient with direct contact information for care management team; Advised patient, providing education and rationale, to monitor blood pressure daily and record, calling PCP for findings outside established parameters. The patient normally does not take blood pressures at home. Has a cuff. Education and support given on the benefits of checking blood pressures on a regular basis;  Reviewed scheduled/upcoming provider appointments including: 06-09-2023 at 0840 am with the pcp Advised patient to discuss changes in her blood pressures and heart health with provider, reminder provided today; Provided education on prescribed diet heart healthy/ADA diet. Talked about eating smaller more  frequent meals to keep her chronic conditions stable ;  Discussed complications of poorly controlled blood pressure such as heart disease, stroke, circulatory complications, vision complications, kidney impairment, sexual dysfunction;  Screening for signs and symptoms of depression related to chronic disease state;  Assessed social determinant of health barriers;   Symptom Management: Take medications as prescribed   Attend all scheduled provider appointments Call provider office for new concerns or questions  call the Suicide and Crisis Lifeline: 988 call the [place] National Suicide Prevention Lifeline: 6391418560 or TTY: 682-327-0462 TTY 573-454-7154) to talk to a trained counselor call 1-800-273-TALK (toll free, 24 hour hotline) if experiencing a Mental Health or Behavioral Health Crisis  check blood pressure weekly learn about high blood pressure call doctor for signs and symptoms of high blood pressure develop an action plan for high blood pressure keep all doctor appointments take medications for blood pressure exactly as prescribed begin an exercise program report new symptoms to your doctor  Follow Up Plan: Telephone follow up appointment with care management team member scheduled for: 08-10-2023 at 0900 am          SDOH assessments and interventions completed:  No     Care Coordination Interventions:  Yes, provided   Interventions Today    Flowsheet Row Most Recent Value  Chronic Disease   Chronic disease during today's visit Diabetes, Atrial Fibrillation (AFib), Hypertension (HTN)  General Interventions   General Interventions Discussed/Reviewed General Interventions Reviewed, Doctor Visits, Health Screening, Durable Medical Equipment (DME)  Doctor Visits Discussed/Reviewed Doctor Visits Reviewed, PCP, Specialist, Annual Wellness Visits  [upcoming PCP 12/20 and AWV 4/14]  Health Screening Mammogram  [mammogram 12/10]  Durable Medical Equipment (DME) Glucomoter   Mosetta Pigeon G7 monitor]  PCP/Specialist Visits Compliance with follow-up visit  [recent visit with cardiology, now has Zio monitor for 14 days, will come off on 11/28]  Exercise Interventions   Exercise Discussed/Reviewed Weight Managment  Weight Management Weight loss  [working to decrease weight back to her normal of 165-170 pounds.  Currently 177 pounds]  Education Interventions   Education Provided Provided Education  Provided Verbal Education On Nutrition, Blood Sugar Monitoring, Medication, When to see the doctor  [No longer taking Ozempic due to recent pancreatitis. Aware of how to maintain Zio Monitor, will follow up with cardiology once results are in]       Follow up plan: No further intervention required.   Encounter Outcome:  Patient Visit Completed   Rodney Langton, RN, MSN, CCM [place]  [facility], [facility] RN Care Coordinator Direct Dial: [phone] / Main [phone] Fax [phone] Email: [email or web address]

## 2023-08-17 ENCOUNTER — Other Ambulatory Visit: Payer: Self-pay | Admitting: Nurse Practitioner

## 2023-08-22 ENCOUNTER — Telehealth: Payer: Self-pay | Admitting: Nurse Practitioner

## 2023-08-22 ENCOUNTER — Encounter: Payer: Self-pay | Admitting: Nurse Practitioner

## 2023-08-22 NOTE — Telephone Encounter (Signed)
Charlene Juarez, the pt, calling back to follow up on the Dex Com G7.  Pt states she is getting aggravated and is going out of town on 11/15.   Please call pt .

## 2023-08-22 NOTE — Telephone Encounter (Signed)
Requested Prescriptions  Pending Prescriptions Disp Refills   metFORMIN (GLUCOPHAGE) 1000 MG tablet [Pharmacy Med Name: METFORMIN HCL 1,000 MG TABLET] 180 tablet 1    Sig: TAKE 1 TABLET (1,000 MG TOTAL) BY MOUTH TWICE A DAY WITH FOOD     Endocrinology:  Diabetes - Biguanides Passed - 08/17/2023  8:13 PM      Passed - Cr in normal range and within 360 days    Creatinine, Ser  Date Value Ref Range Status  06/09/2023 0.76 0.57 - 1.00 mg/dL Final         Passed - HBA1C is between 0 and 7.9 and within 180 days    HB A1C (BAYER DCA - WAIVED)  Date Value Ref Range Status  02/24/2023 5.7 (H) 4.8 - 5.6 % Final    Comment:             Prediabetes: 5.7 - 6.4          Diabetes: >6.4          Glycemic control for adults with diabetes: <7.0    Hgb A1c MFr Bld  Date Value Ref Range Status  06/09/2023 6.0 (H) 4.8 - 5.6 % Final    Comment:             Prediabetes: 5.7 - 6.4          Diabetes: >6.4          Glycemic control for adults with diabetes: <7.0          Passed - eGFR in normal range and within 360 days    GFR calc Af Amer  Date Value Ref Range Status  05/08/2020 95 >59 mL/min/1.73 Final    Comment:    **[facility] currently reports eGFR in compliance with the current**   recommendations of the [employer]. [facility] will   update reporting as new guidelines are published from the NKF-ASN   Task force.    GFR, Estimated  Date Value Ref Range Status  05/24/2022 >60 >60 mL/min Final    Comment:    (NOTE) Calculated using the CKD-EPI Creatinine Equation (2021)    eGFR  Date Value Ref Range Status  06/09/2023 84 >59 mL/min/1.73 Final         Passed - B12 Level in normal range and within 720 days    Vitamin B-12  Date Value Ref Range Status  11/24/2022 722 232 - 1,245 pg/mL Final         Passed - Valid encounter within last 6 months    Recent Outpatient Visits           2 months ago Insulin dependent type 2 diabetes mellitus (HCC)   [place] [facility] [place], [place] T, NP   3 months ago Tinnitus of both ears   [place] [facility] [place], [place] T, NP   5 months ago Insulin dependent type 2 diabetes mellitus (HCC)   [place] [facility] [place], Dorie Rank, NP   9 months ago Medicare annual wellness visit, subsequent   [place] [facility] [place], [place] T, NP   11 months ago Insulin dependent type 2 diabetes mellitus (HCC)   [place] [facility] [place], Dorie Rank, NP       Future Appointments             In 1 week [place], Dorie Rank, NP [place] [facility], [facility]   In 2 weeks [place], Dorie Rank, NP  [place] [facility], [facility]            Passed - CBC within normal limits and completed in the last 12 months    WBC  Date Value Ref Range Status  11/24/2022 6.4 3.4 - 10.8 x10E3/uL Final  05/24/2022 7.3 4.0 - 10.5 K/uL Final   RBC  Date Value Ref Range Status  11/24/2022 4.86 3.77 - 5.28 x10E6/uL Final  05/24/2022 4.38 3.87 - 5.11 MIL/uL Final   Hemoglobin  Date Value Ref Range Status  11/24/2022 14.1 11.1 - 15.9 g/dL Final   Hematocrit  Date Value Ref Range Status  11/24/2022 42.4 34.0 - 46.6 % Final   MCHC  Date Value Ref Range Status  11/24/2022 33.3 31.5 - 35.7 g/dL Final  40/98/1191 47.8 30.0 - 36.0 g/dL Final   [facility]  Date Value Ref Range Status  11/24/2022 29.0 26.6 - 33.0 pg Final  05/24/2022 29.2 26.0 - 34.0 pg Final   MCV  Date Value Ref Range Status  11/24/2022 87 79 - 97 fL Final   No results found for: "PLTCOUNTKUC", "LABPLAT", "POCPLA" RDW  Date Value Ref Range Status  11/24/2022 13.5 11.7 - 15.4 % Final          telmisartan (MICARDIS) 80 MG tablet [Pharmacy Med Name: TELMISARTAN 80 MG TABLET] 90 tablet 1    Sig: TAKE 1 TABLET BY MOUTH EVERY DAY     Cardiovascular:  Angiotensin Receptor Blockers Passed - 08/17/2023  8:13 PM      Passed - Cr in normal range and within 180 days     Creatinine, Ser  Date Value Ref Range Status  06/09/2023 0.76 0.57 - 1.00 mg/dL Final         Passed - K in normal range and within 180 days    Potassium  Date Value Ref Range Status  06/09/2023 4.2 3.5 - 5.2 mmol/L Final         Passed - Patient is not pregnant      Passed - Last BP in normal range    BP Readings from Last 1 Encounters:  08/01/23 124/60         Passed - Valid encounter within last 6 months    Recent Outpatient Visits           2 months ago Insulin dependent type 2 diabetes mellitus (HCC)   [place] [facility] [place], Corrie Dandy T, NP   3 months ago Tinnitus of both ears   [place] [facility] [place], [place] T, NP   5 months ago Insulin dependent type 2 diabetes mellitus (HCC)   [place] [facility] [place], Dorie Rank, NP   9 months ago Medicare annual wellness visit, subsequent   [place] [facility] [facility] [place], [place] T, NP   11 months ago Insulin dependent type 2 diabetes mellitus (HCC)   [place] [facility] [place], Dorie Rank, NP       Future Appointments             In 1 week Cann[place], Dorie Rank, NP [facility], [facility]   In 2 weeks [place], Dorie Rank, NP [place] [facility], [facility]

## 2023-08-22 NOTE — Telephone Encounter (Signed)
Kylie with [facility] is calling in to check on the status of a fax that was sent over. Says the paperwork has been faxed multiple times and they haven't received anything back. Please follow up with [facility]  [phone] option [phone]

## 2023-08-22 NOTE — Telephone Encounter (Signed)
See mychart message with the patient.

## 2023-08-22 NOTE — Telephone Encounter (Signed)
Called and spoke with Tobi Bastos from [facility]. She is re-faxing the paperwork that they are needing [place] to complete. Confirmed faxed number with Tobi Bastos.   Messaged patient back in her mychart message and let her know the above information and apologized to the patient for this.

## 2023-08-22 NOTE — Telephone Encounter (Signed)
Patient called stated there are some papers beng sent from [facility] that Dr Harvest Dark needs to complete and send back or she will be out of what she needs with the Dexcom G7. Please f/u with patient

## 2023-08-23 NOTE — Telephone Encounter (Signed)
Fax received, signed by Corrie Dandy, and faxed back along with office visit notes as requested.   Called and notified patient that this was done for her.

## 2023-08-28 NOTE — Patient Instructions (Signed)
Be Involved in Caring For Your Health:  Taking Medications When medications are taken as directed, they can greatly improve your health. But if they are not taken as prescribed, they may not work. In some cases, not taking them correctly can be harmful. To help ensure your treatment remains effective and safe, understand your medications and how to take them. Bring your medications to each visit for review by your provider.  Your lab results, notes, and after visit summary will be available on My Chart. We strongly encourage you to use this feature. If lab results are abnormal the clinic will contact you with the appropriate steps. If the clinic does not contact you assume the results are satisfactory. You can always view your results on My Chart. If you have questions regarding your health or results, please contact the clinic during office hours. You can also ask questions on My Chart.  We at [facility] are grateful that you chose [place] to provide your care. We strive to provide evidence-based and compassionate care and are always looking for feedback. If you get a survey from the clinic please complete this so we can hear your opinions.  Diabetes Mellitus and Foot Care Diabetes, also called diabetes mellitus, may cause problems with your feet and legs because of poor blood flow (circulation). Poor circulation may make your skin: Become thinner and drier. Break more easily. Heal more slowly. Peel and crack. You may also have nerve damage (neuropathy). This can cause decreased feeling in your legs and feet. This means that you may not notice minor injuries to your feet that could lead to more serious problems. Finding and treating problems early is the best way to prevent future foot problems. How to care for your feet Foot hygiene  Wash your feet daily with warm water and mild soap. Do not use hot water. Then, pat your feet and the areas between your toes until they are fully dry. Do  not soak your feet. This can dry your skin. Trim your toenails straight across. Do not dig under them or around the cuticle. File the edges of your nails with an emery board or nail file. Apply a moisturizing lotion or petroleum jelly to the skin on your feet and to dry, brittle toenails. Use lotion that does not contain alcohol and is unscented. Do not apply lotion between your toes. Shoes and socks Wear clean socks or stockings every day. Make sure they are not too tight. Do not wear knee-high stockings. These may decrease blood flow to your legs. Wear shoes that fit well and have enough cushioning. Always look in your shoes before you put them on to be sure there are no objects inside. To break in new shoes, wear them for just a few hours a day. This prevents injuries on your feet. Wounds, scrapes, corns, and calluses  Check your feet daily for blisters, cuts, bruises, sores, and redness. If you cannot see the bottom of your feet, use a mirror or ask someone for help. Do not cut off corns or calluses or try to remove them with medicine. If you find a minor scrape, cut, or break in the skin on your feet, keep it and the skin around it clean and dry. You may clean these areas with mild soap and water. Do not clean the area with peroxide, alcohol, or iodine. If you have a wound, scrape, corn, or callus on your foot, look at it several times a day to make sure it  is healing and not infected. Check for: Redness, swelling, or pain. Fluid or blood. Warmth. Pus or a bad smell. General tips Do not cross your legs. This may decrease blood flow to your feet. Do not use heating pads or hot water bottles on your feet. They may burn your skin. If you have lost feeling in your feet or legs, you may not know this is happening until it is too late. Protect your feet from hot and cold by wearing shoes, such as at the beach or on hot pavement. Schedule a complete foot exam at least once a year or more often if  you have foot problems. Report any cuts, sores, or bruises to your health care provider right away. Where to find more information American Diabetes Association: diabetes.org Association of Diabetes Care & Education Specialists: diabeteseducator.org Contact a health care provider if: You have a condition that increases your risk of infection, and you have any cuts, sores, or bruises on your feet. You have an injury that is not healing. You have redness on your legs or feet. You feel burning or tingling in your legs or feet. You have pain or cramps in your legs and feet. Your legs or feet are numb. Your feet always feel cold. You have pain around any toenails. Get help right away if: You have a wound, scrape, corn, or callus on your foot and: You have signs of infection. You have a fever. You have a red line going up your leg. This information is not intended to replace advice given to you by your health care provider. Make sure you discuss any questions you have with your health care provider. Document Revised: 03/10/2022 Document Reviewed: 03/10/2022 Elsevier Patient Education  2024 [employer].

## 2023-08-30 ENCOUNTER — Ambulatory Visit
Admission: RE | Admit: 2023-08-30 | Discharge: 2023-08-30 | Disposition: A | Discharge status: Home / Self Care | Payer: Medicare Other | Source: Ambulatory Visit | Attending: Nurse Practitioner | Admitting: Nurse Practitioner

## 2023-08-30 ENCOUNTER — Ambulatory Visit: Payer: Medicare Other | Admitting: Nurse Practitioner

## 2023-08-30 DIAGNOSIS — Z1231 Encounter for screening mammogram for malignant neoplasm of breast: Secondary | ICD-10-CM | POA: Diagnosis not present

## 2023-08-31 ENCOUNTER — Ambulatory Visit (INDEPENDENT_AMBULATORY_CARE_PROVIDER_SITE_OTHER): Payer: Medicare Other | Admitting: Nurse Practitioner

## 2023-08-31 ENCOUNTER — Encounter: Payer: Self-pay | Admitting: Nurse Practitioner

## 2023-08-31 VITALS — BP 128/69 | HR 82 | Temp 97.7°F | Ht 62.0 in | Wt 181.2 lb

## 2023-08-31 DIAGNOSIS — R7989 Other specified abnormal findings of blood chemistry: Secondary | ICD-10-CM | POA: Diagnosis not present

## 2023-08-31 DIAGNOSIS — R011 Cardiac murmur, unspecified: Secondary | ICD-10-CM

## 2023-08-31 DIAGNOSIS — E785 Hyperlipidemia, unspecified: Secondary | ICD-10-CM

## 2023-08-31 DIAGNOSIS — Z794 Long term (current) use of insulin: Secondary | ICD-10-CM | POA: Diagnosis not present

## 2023-08-31 DIAGNOSIS — Z6830 Body mass index (BMI) 30.0-30.9, adult: Secondary | ICD-10-CM

## 2023-08-31 DIAGNOSIS — E66811 Obesity, class 1: Secondary | ICD-10-CM

## 2023-08-31 DIAGNOSIS — E6609 Other obesity due to excess calories: Secondary | ICD-10-CM | POA: Diagnosis not present

## 2023-08-31 DIAGNOSIS — E1159 Type 2 diabetes mellitus with other circulatory complications: Secondary | ICD-10-CM

## 2023-08-31 DIAGNOSIS — G4733 Obstructive sleep apnea (adult) (pediatric): Secondary | ICD-10-CM

## 2023-08-31 DIAGNOSIS — J4520 Mild intermittent asthma, uncomplicated: Secondary | ICD-10-CM

## 2023-08-31 DIAGNOSIS — E119 Type 2 diabetes mellitus without complications: Secondary | ICD-10-CM | POA: Diagnosis not present

## 2023-08-31 DIAGNOSIS — I152 Hypertension secondary to endocrine disorders: Secondary | ICD-10-CM

## 2023-08-31 DIAGNOSIS — E1169 Type 2 diabetes mellitus with other specified complication: Secondary | ICD-10-CM

## 2023-08-31 LAB — BAYER DCA HB A1C WAIVED: HB A1C (BAYER DCA - WAIVED): 6.2 % — ABNORMAL HIGH (ref 4.8–5.6)

## 2023-08-31 NOTE — Progress Notes (Signed)
BP 128/69   Pulse 82   Temp 97.7 F (36.5 C) (Oral)   Ht 5\' 2"  (1.575 m)   Wt 181 lb 3.2 oz (82.2 kg)   LMP 07/13/2000 (Approximate)   SpO2 98%   BMI 33.14 kg/m    Subjective:    Patient ID: Charlene Juarez, female    DOB: 1951-06-05, 72 y.o.   MRN: [id number]  HPI: Charlene Juarez is a 72 y.o. female  Chief Complaint  Patient presents with   Diabetes   Hyperlipidemia   Hypertension   DIABETES September A1c was 6%. Using Tresiba 46 units, Jardiance 25 MG, Metformin 1000 MG BID, Novolog as needed before meals (takes 12 units for every meal).  Did take one dose of her leftover Glipizide due to eating a lot of candy one day.  Taking Gabapentin 100 MG BID for neuropathy. Uses Dexcom = average over past 90 days has been 133, in range 76% of the time, 15% high, and 5% low.  Can not take GLP1 or Mounjaro due to pancreatitis with these.  History of fatty liver noted on past imaging.  Hypoglycemic episodes:no Polydipsia/polyuria: no Visual disturbance: no Chest pain: no Paresthesias: no Glucose Monitoring: no             Accucheck frequency: daily              Fasting glucose:              Post prandial:             Evening:              Before meals: Taking Insulin?: yes             Long acting insulin: 46 units             Short acting insulin: 12 units Blood Pressure Monitoring: yes Retinal Examination: Up To Date -- [place] Foot Exam: Up to Date Pneumovax: Up to Date Influenza: Up to Date Aspirin: no   HYPERTENSION / HYPERLIPIDEMIA Taking Telmisartan + Crestor.  HCTZ was discontinued months back due to use of insulin -- we restarted this as she was concerned about swelling to ankles.  Saw cardiology on 08/01/23 due to palpitations and overall reassuring work-up. Satisfied with current treatment? yes Duration of hypertension: chronic BP monitoring frequency: not often BP range:  BP medication side effects: no Duration of hyperlipidemia:  chronic Cholesterol medication side effects: no Cholesterol supplements: fish oil Medication compliance: good compliance Aspirin: no Recent stressors: no Recurrent headaches: no Visual changes: no Palpitations: no Dyspnea: no Chest pain: no Lower extremity edema: no Dizzy/lightheaded: no  The ASCVD Risk score (Arnett DK, et al., 2019) failed to calculate for the following reasons:   The valid total cholesterol range is 130 to 320 mg/dL  Relevant past medical, surgical, family and social history reviewed and updated as indicated. Interim medical history since our last visit reviewed. Allergies and medications reviewed and updated.  Review of Systems  Constitutional:  Negative for activity change, appetite change, diaphoresis, fatigue and fever.  HENT:  Negative for ear discharge and ear pain.   Respiratory:  Negative for cough, chest tightness, shortness of breath and wheezing.   Cardiovascular:  Negative for chest pain, palpitations and leg swelling.  Endocrine: Negative for polydipsia, polyphagia and polyuria.  Neurological: Negative.   Psychiatric/Behavioral: Negative.     Per HPI unless specifically indicated above     Objective:    BP 128/69  Pulse 82   Temp 97.7 F (36.5 C) (Oral)   Ht 5\' 2"  (1.575 m)   Wt 181 lb 3.2 oz (82.2 kg)   LMP 07/13/2000 (Approximate)   SpO2 98%   BMI 33.14 kg/m   Wt Readings from Last 3 Encounters:  08/31/23 181 lb 3.2 oz (82.2 kg)  08/01/23 178 lb 8 oz (81 kg)  06/09/23 173 lb 6.4 oz (78.7 kg)    Physical Exam Vitals and nursing note reviewed.  Constitutional:      General: She is awake. She is not in acute distress.    Appearance: Normal appearance. She is well-developed and well-groomed. She is obese. She is not ill-appearing or toxic-appearing.  HENT:     Head: Normocephalic.     Right Ear: Hearing and external ear normal.     Left Ear: Hearing and external ear normal.  Eyes:     General: Lids are normal.        Right  eye: No discharge.        Left eye: No discharge.     Conjunctiva/sclera: Conjunctivae normal.     Pupils: Pupils are equal, round, and reactive to light.  Neck:     Thyroid: No thyromegaly.     Vascular: No carotid bruit.  Cardiovascular:     Rate and Rhythm: Normal rate and regular rhythm.     Heart sounds: Murmur heard.     Systolic murmur is present with a grade of 2/6.     No gallop.  Pulmonary:     Effort: Pulmonary effort is normal. No accessory muscle usage or respiratory distress.     Breath sounds: Normal breath sounds.  Abdominal:     General: Bowel sounds are normal. There is no distension.     Palpations: Abdomen is soft.     Tenderness: There is no abdominal tenderness.  Musculoskeletal:     Cervical back: Normal range of motion and neck supple.     Right lower leg: No edema.     Left lower leg: No edema.  Lymphadenopathy:     Cervical: No cervical adenopathy.  Skin:    General: Skin is warm and dry.  Neurological:     Mental Status: She is alert and oriented to person, place, and time.     Deep Tendon Reflexes: Reflexes are normal and symmetric.     Reflex Scores:      Brachioradialis reflexes are 2+ on the right side and 2+ on the left side.      Patellar reflexes are 2+ on the right side and 2+ on the left side. Psychiatric:        Attention and Perception: Attention normal.        Mood and Affect: Mood normal.        Speech: Speech normal.        Behavior: Behavior normal. Behavior is cooperative.        Thought Content: Thought content normal.    Results for orders placed or performed in visit on 08/23/23  HM DIABETES EYE EXAM  Result Value Ref Range   HM Diabetic Eye Exam No Retinopathy No Retinopathy      Assessment & Plan:   Problem List Items Addressed This Visit       Cardiovascular and Mediastinum   Hypertension associated with diabetes (HCC)    Chronic, stable.  BP at goal in office and at home.  Continue Telmisartan for kidney and  hydrochlorothiazide for fluid retention, she is  aware this medication could diminish effects of insulin == may need to consider Amlodipine if BP trends up in future.  LABS: CMP.  Urine ALB 28 November 2022.  Recommend she continue to check BP at home on occasion and document for visits. Focus on DASH diet.  Return in 3 months.      Relevant Orders   Bayer DCA Hb A1c Waived   Basic metabolic panel     Endocrine   Hyperlipidemia associated with type 2 diabetes mellitus (HCC)    Chronic, ongoing.  Will continue Rosuvastatin daily and adjust dose as needed.  Lipid panel today.        Relevant Orders   Bayer DCA Hb A1c Waived   Basic metabolic panel   Lipid Panel w/o Chol/HDL Ratio   Insulin dependent type 2 diabetes mellitus (HCC) - Primary    Chronic, ongoing.  Urine ALB 28 November 2022.  A1c 6.2% today, remaining stable.  Pancreatitis presented with Mounjaro & Ozempic.  Had tolerated Ozempic well for years.  Uses Dexcom. - At this time will maintain off GLP1 or Mounjaro due to pancreatitis in past. - Continue Jardiance and Metformin. Continue Tresiba 46 units, reduce this if morning BS consistently less then 130 or lows <70 present - discussed at length with her.  Continue Novolog pre-meals 5-12 units as needed only (not to take consistently), educated her on this and not to take if does not eat a meal or take after meals. - Dexcom continues.  Continue to work with [facility] on assistance programs. - Check blood sugar 4-5 times a day and document for provider.   - Foot and eye exams up to date - Statin and ARB on board. - Flu shot and Pneumonia vaccines up to date. Return in 3 months.      Relevant Orders   Bayer DCA Hb A1c Waived     Other   Elevated LFTs    Recheck CMP today.  Recent had improved.      Obesity    BMI 33.14, slight gain due to dietary indiscretions.  Recommended eating smaller high protein, low fat meals more frequently and exercising 30 mins a day 5 times a week  with a goal of 10-15lb weight loss in the next 3 months. Patient voiced their understanding and motivation to adhere to these recommendations.       Systolic murmur    Noted on exam.  Asymptomatic at this time.  Reassuring work-up with cardiology recently.        Follow up plan: Return in about 3 months (around 11/29/2023) for T2DM, HTN/HLD, ASTHMA, OSA.

## 2023-08-31 NOTE — Assessment & Plan Note (Signed)
Chronic, stable.  BP at goal in office and at home.  Continue Telmisartan for kidney and hydrochlorothiazide for fluid retention, she is aware this medication could diminish effects of insulin == may need to consider Amlodipine if BP trends up in future.  LABS: CMP.  Urine ALB 28 November 2022.  Recommend she continue to check BP at home on occasion and document for visits. Focus on DASH diet.  Return in 3 months.

## 2023-08-31 NOTE — Assessment & Plan Note (Signed)
Chronic, ongoing.  Urine ALB 28 November 2022.  A1c 6.2% today, remaining stable.  Pancreatitis presented with Mounjaro & Ozempic.  Had tolerated Ozempic well for years.  Uses Dexcom. - At this time will maintain off GLP1 or Mounjaro due to pancreatitis in past. - Continue Jardiance and Metformin. Continue Tresiba 46 units, reduce this if morning BS consistently less then 130 or lows <70 present - discussed at length with her.  Continue Novolog pre-meals 5-12 units as needed only (not to take consistently), educated her on this and not to take if does not eat a meal or take after meals. - Dexcom continues.  Continue to work with [facility] PharmD on assistance programs. - Check blood sugar 4-5 times a day and document for provider.   - Foot and eye exams up to date - Statin and ARB on board. - Flu shot and Pneumonia vaccines up to date. Return in 3 months.

## 2023-08-31 NOTE — Assessment & Plan Note (Signed)
Noted on exam.  Asymptomatic at this time.  Reassuring work-up with cardiology recently.

## 2023-08-31 NOTE — Assessment & Plan Note (Signed)
BMI 33.14, slight gain due to dietary indiscretions.  Recommended eating smaller high protein, low fat meals more frequently and exercising 30 mins a day 5 times a week with a goal of 10-15lb weight loss in the next 3 months. Patient voiced their understanding and motivation to adhere to these recommendations.

## 2023-08-31 NOTE — Assessment & Plan Note (Signed)
Chronic, ongoing.  Will continue Rosuvastatin daily and adjust dose as needed.  Lipid panel today.

## 2023-08-31 NOTE — Assessment & Plan Note (Signed)
Recheck CMP today.  Recent had improved.

## 2023-09-01 DIAGNOSIS — I471 Supraventricular tachycardia, unspecified: Secondary | ICD-10-CM | POA: Diagnosis not present

## 2023-09-01 DIAGNOSIS — I479 Paroxysmal tachycardia, unspecified: Secondary | ICD-10-CM | POA: Diagnosis not present

## 2023-09-01 LAB — BASIC METABOLIC PANEL
BUN/Creatinine Ratio: 15 (ref 12–28)
BUN: 15 mg/dL (ref 8–27)
CO2: 22 mmol/L (ref 20–29)
Calcium: 9 mg/dL (ref 8.7–10.3)
Chloride: 101 mmol/L (ref 96–106)
Creatinine, Ser: 1 mg/dL (ref 0.57–1.00)
Glucose: 108 mg/dL — ABNORMAL HIGH (ref 70–99)
Potassium: 4.1 mmol/L (ref 3.5–5.2)
Sodium: 140 mmol/L (ref 134–144)
eGFR: 60 mL/min/{1.73_m2} (ref >59)

## 2023-09-01 LAB — LIPID PANEL W/O CHOL/HDL RATIO
Cholesterol, Total: 138 mg/dL (ref 100–199)
HDL: 43 mg/dL (ref >39)
LDL Chol Calc (NIH): 60 mg/dL (ref 0–99)
Triglycerides: 215 mg/dL — ABNORMAL HIGH (ref 0–149)
VLDL Cholesterol Cal: 35 mg/dL (ref 5–40)

## 2023-09-01 NOTE — Progress Notes (Signed)
Contacted via MyChart   Normal mammogram, may repeat in one year:)

## 2023-09-01 NOTE — Progress Notes (Signed)
Contacted via MyChart   Good morning Charlene Juarez, your labs have returned and are overall stable: - Kidney function, creatinine and eGFR, remains normal. - Lipid panel shows stable LDL, but triglycerides mildly elevated.  Continue all medications and focus on diet at home. Any questions? Keep being stellar!!  Thank you for allowing me to participate in your care.  I appreciate you. Kindest regards, Jujhar Everett

## 2023-09-09 ENCOUNTER — Ambulatory Visit: Payer: Medicare Other | Admitting: Nurse Practitioner

## 2023-09-10 ENCOUNTER — Other Ambulatory Visit: Payer: Self-pay | Admitting: Gastroenterology

## 2023-09-10 ENCOUNTER — Encounter: Payer: Self-pay | Admitting: Nurse Practitioner

## 2023-09-10 ENCOUNTER — Other Ambulatory Visit: Payer: Self-pay | Admitting: Nurse Practitioner

## 2023-09-12 MED ORDER — GABAPENTIN 100 MG PO CAPS: 100.0000 mg | ORAL_CAPSULE | Freq: Three times a day (TID) | ORAL | 4 refills | Status: DC

## 2023-09-12 NOTE — Telephone Encounter (Signed)
Requested Prescriptions  Pending Prescriptions Disp Refills   rosuvastatin (CRESTOR) 20 MG tablet [Pharmacy Med Name: ROSUVASTATIN CALCIUM 20 MG TAB] 90 tablet 0    Sig: TAKE 1 TABLET BY MOUTH EVERY DAY     Cardiovascular:  Antilipid - Statins 2 Failed - 09/12/2023  1:26 PM      Failed - Lipid Panel in normal range within the last 12 months    Cholesterol, Total  Date Value Ref Range Status  08/31/2023 138 100 - 199 mg/dL Final   LDL Chol Calc (NIH)  Date Value Ref Range Status  08/31/2023 60 0 - 99 mg/dL Final   HDL  Date Value Ref Range Status  08/31/2023 43 >39 mg/dL Final   Triglycerides  Date Value Ref Range Status  08/31/2023 215 (H) 0 - 149 mg/dL Final         Passed - Cr in normal range and within 360 days    Creatinine, Ser  Date Value Ref Range Status  08/31/2023 1.00 0.57 - 1.00 mg/dL Final         Passed - Patient is not pregnant      Passed - Valid encounter within last 12 months    Recent Outpatient Visits           1 week ago Insulin dependent type 2 diabetes mellitus (HCC)   [place] [facility] [place], [place] T, NP   3 months ago Insulin dependent type 2 diabetes mellitus (HCC)   [place] [facility] [place], [place] T, NP   4 months ago Tinnitus of both ears   [place] [facility] [place], [place] T, NP   6 months ago Insulin dependent type 2 diabetes mellitus (HCC)   [place] [facility] [place], Dorie Rank, NP   9 months ago Medicare annual wellness visit, subsequent   [place] [facility] [place], Dorie Rank, NP       Future Appointments             In 2 months Cannady, Dorie Rank, NP  [facility]             JARDIANCE 25 MG TABS tablet [Pharmacy Med Name: JARDIANCE 25 MG TABLET] 90 tablet 0    Sig: TAKE 1 TABLET (25 MG TOTAL) BY MOUTH DAILY.     Endocrinology:  Diabetes - SGLT2 Inhibitors Passed - 09/12/2023  1:26 PM      Passed  - Cr in normal range and within 360 days    Creatinine, Ser  Date Value Ref Range Status  08/31/2023 1.00 0.57 - 1.00 mg/dL Final         Passed - HBA1C is between 0 and 7.9 and within 180 days    HB A1C (BAYER DCA - WAIVED)  Date Value Ref Range Status  08/31/2023 6.2 (H) 4.8 - 5.6 % Final    Comment:             Prediabetes: 5.7 - 6.4          Diabetes: >6.4          Glycemic control for adults with diabetes: <7.0          Passed - eGFR in normal range and within 360 days    GFR calc Af Amer  Date Value Ref Range Status  05/08/2020 95 >59 mL/min/1.73 Final    Comment:    **[facility] currently reports eGFR in compliance with the current**  recommendations of the [employer]. [facility] will   update reporting as new guidelines are published from the NKF-ASN   Task force.    GFR, Estimated  Date Value Ref Range Status  05/24/2022 >60 >60 mL/min Final    Comment:    (NOTE) Calculated using the CKD-EPI Creatinine Equation (2021)    eGFR  Date Value Ref Range Status  08/31/2023 60 >59 mL/min/1.73 Final         Passed - Valid encounter within last 6 months    Recent Outpatient Visits           1 week ago Insulin dependent type 2 diabetes mellitus (HCC)   [place] [facility] [place], [place] T, NP   3 months ago Insulin dependent type 2 diabetes mellitus (HCC)   [place] [facility] [place], [place] T, NP   4 months ago Tinnitus of both ears   [place] [facility] [place], [place] T, NP   6 months ago Insulin dependent type 2 diabetes mellitus (HCC)   [place] [facility] [place], Dorie Rank, NP   9 months ago Medicare annual wellness visit, subsequent   [place] [facility] [place], Dorie Rank, NP       Future Appointments             In 2 months Cannady, Dorie Rank, NP [place] [facility]            Refused Prescriptions Disp Refills   glipiZIDE  (GLUCOTROL) 5 MG tablet [Pharmacy Med Name: GLIPIZIDE 5 MG TABLET] 90 tablet 4    Sig: TAKE 1 TABLET BY MOUTH DAILY BEFORE BREAKFAST.     Endocrinology:  Diabetes - Sulfonylureas Passed - 09/12/2023  1:26 PM      Passed - HBA1C is between 0 and 7.9 and within 180 days    HB A1C (BAYER DCA - WAIVED)  Date Value Ref Range Status  08/31/2023 6.2 (H) 4.8 - 5.6 % Final    Comment:             Prediabetes: 5.7 - 6.4          Diabetes: >6.4          Glycemic control for adults with diabetes: <7.0          Passed - Cr in normal range and within 360 days    Creatinine, Ser  Date Value Ref Range Status  08/31/2023 1.00 0.57 - 1.00 mg/dL Final         Passed - Valid encounter within last 6 months    Recent Outpatient Visits           1 week ago Insulin dependent type 2 diabetes mellitus (HCC)   [place] [facility] [place], [place] T, NP   3 months ago Insulin dependent type 2 diabetes mellitus (HCC)   [place] [facility] [place], [place] T, NP   4 months ago Tinnitus of both ears   [place] [facility] [place], [place] T, NP   6 months ago Insulin dependent type 2 diabetes mellitus (HCC)   [place] [facility] [place], Dorie Rank, NP   9 months ago Medicare annual wellness visit, subsequent   [place] [facility] [place], Dorie Rank, NP       Future Appointments             In 2 months Marjie Skiff, NP [facility]  [facility]

## 2023-09-27 ENCOUNTER — Encounter: Payer: Self-pay | Admitting: Emergency Medicine

## 2023-09-27 ENCOUNTER — Encounter: Payer: Self-pay | Admitting: Cardiovascular Disease

## 2023-09-27 ENCOUNTER — Emergency Department: Payer: Medicare Other

## 2023-09-27 ENCOUNTER — Emergency Department
Admission: EM | Admit: 2023-09-27 | Discharge: 2023-09-28 | Disposition: A | Discharge status: Home / Self Care | Payer: Medicare Other | Attending: Emergency Medicine | Admitting: Emergency Medicine

## 2023-09-27 DIAGNOSIS — I471 Supraventricular tachycardia, unspecified: Secondary | ICD-10-CM | POA: Diagnosis not present

## 2023-09-27 DIAGNOSIS — I10 Essential (primary) hypertension: Secondary | ICD-10-CM | POA: Diagnosis not present

## 2023-09-27 DIAGNOSIS — R739 Hyperglycemia, unspecified: Secondary | ICD-10-CM | POA: Diagnosis not present

## 2023-09-27 DIAGNOSIS — R079 Chest pain, unspecified: Secondary | ICD-10-CM | POA: Diagnosis not present

## 2023-09-27 DIAGNOSIS — I499 Cardiac arrhythmia, unspecified: Secondary | ICD-10-CM | POA: Diagnosis not present

## 2023-09-27 DIAGNOSIS — R002 Palpitations: Secondary | ICD-10-CM

## 2023-09-27 DIAGNOSIS — E119 Type 2 diabetes mellitus without complications: Secondary | ICD-10-CM | POA: Diagnosis not present

## 2023-09-27 DIAGNOSIS — I700 Atherosclerosis of aorta: Secondary | ICD-10-CM | POA: Diagnosis not present

## 2023-09-27 DIAGNOSIS — I472 Ventricular tachycardia, unspecified: Secondary | ICD-10-CM | POA: Diagnosis not present

## 2023-09-27 DIAGNOSIS — R000 Tachycardia, unspecified: Secondary | ICD-10-CM | POA: Diagnosis not present

## 2023-09-27 NOTE — ED Provider Notes (Addendum)
 [facility] Provider Note    Event Date/Time   First MD Initiated Contact with Patient 09/27/23 2347     (approximate)   History   Palpitations   HPI  Charlene Juarez is a 73 y.o. female   Past medical history of SVT, diabetes, GERD, hypertension hyperlipidemia presents to the emergency department with palpitations tonight.  Several episodes lasting minutes at a time reminiscent of her SVT in the past.  She took her as needed diltiazem  60 mg x 2 and called EMS.  EMS found her in SVT and converted after 6 mg of adenosine.  She is asymptomatic currently.  During her episode she has some burning sensation in her chest.  She denies chest pain currently.  Last several days have been unremarkable.  She has had a mild cough.  She denies chest pain.  She has had no significant caffeine intake, and she has been compliant with all medications.   External Medical Documents Reviewed: Dr. Tresia cardiology notes including telephone encounter today regarding her palpitations intermittent with a plan to adjust medications if continuing to have episodes      Physical Exam   Triage Vital Signs: ED Triage Vitals  Encounter Vitals Group     BP --      Systolic BP Percentile --      Diastolic BP Percentile --      Pulse Rate 09/27/23 2351 (!) 105     Resp 09/27/23 2351 20     Temp 09/27/23 2351 98.2 F (36.8 C)     Temp Source 09/27/23 2351 Oral     SpO2 09/27/23 2349 96 %     Weight 09/27/23 2347 175 lb (79.4 kg)     Height 09/27/23 2347 5' 2 (1.575 m)     Head Circumference --      Peak Flow --      Pain Score 09/27/23 2347 0     Pain Loc --      Pain Education --      Exclude from Growth Chart --     Most recent vital signs: Vitals:   09/27/23 2349 09/27/23 2351  Pulse:  (!) 105  Resp:  20  Temp:  98.2 F (36.8 C)  SpO2: 96% 98%    General: Awake, no distress.  CV:  Good peripheral perfusion.  Resp:  Normal effort.  Abd:  No  distention.  Other:  Awake alert comfortable appearing tachycardic sinus rhythm on monitor in the low 100s.  Normotensive.  Awake alert and oriented.  Clear lung sounds, and normal heart sounds.  Appears euvolemic.   ED Results / Procedures / Treatments   Labs (all labs ordered are listed, but only abnormal results are displayed) Labs Reviewed  CBC - Abnormal; Notable for the following components:      Result Value   WBC 10.6 (*)    RBC 5.35 (*)    Hemoglobin 15.2 (*)    HCT 46.5 (*)    All other components within normal limits  BASIC METABOLIC PANEL  MAGNESIUM  TSH  T4, FREE  TROPONIN I (HIGH SENSITIVITY)     I ordered and reviewed the above labs they are notable for cell counts, electrolytes, troponin unremarkable.  EKG  ED ECG REPORT I, Ginnie Shams, the attending physician, personally viewed and interpreted this ECG.   Date: 09/27/2023  EKG Time: 2357  Rate: 98  Rhythm: sinus  Axis: nl  Intervals:none  ST&T Change: no stemi  RADIOLOGY I independently reviewed and interpreted chest x-ray and I see no obvious focality or pneumothorax I also reviewed radiologist's formal read.   PROCEDURES:  Critical Care performed: No  Procedures   MEDICATIONS ORDERED IN ED: Medications - No data to display  IMPRESSION / MDM / ASSESSMENT AND PLAN / ED COURSE  I reviewed the triage vital signs and the nursing notes.                                Patient's presentation is most consistent with acute presentation with potential threat to life or bodily function.  Differential diagnosis includes, but is not limited to, SVT, dysrhythmia, electrolyte disturbance, thyroid  dysfunction, ACS   The patient is on the cardiac monitor to evaluate for evidence of arrhythmia and/or significant heart rate changes.  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  No obvious inciting event.  Converted with 6 mg of adenosine and patient has taken a total of 120  of diltiazem  already for her as needed home dosing per cardiology.  Will defer further pharmacologic treatment at this time as she is completely asymptomatic and in normal sinus rhythm with normal blood pressure.  Check thyroid  function, electrolytes, troponin.  If normal workup/labs and remains asymptomatic plan will be for discharge home and close follow-up with Dr. Gollan of cardiology for medication adjustments as planned.  - Another very short-lived approximately 1 minute episode of SVT with narrow complex tachycardia approximately 190 bpm Cardiac Monitor.  She Was Normotensive.  She Felt Palpitations and Felt Anxious during This Event.  As We Got Ready to Perform a Vagal Maneuver She Spontaneously Converted to Normal Sinus Rhythm Once More.  Per Dr. Tresia Note Telephone Encounter Earlier Today the Plan Was to Add 25 Mg Daily Metoprolol  If Palpitations Continue to Be a Bother.  I Will Give That Dose Now and Continue to Monitor for Recurrence.  Shortly after had another episode of SVT just after taking the metoprolol  orally so gave an IV dose of 5 mg metoprolol  and quickly converted to normal sinus rhythm 80 bpm.  After making these medication adjustments will continue to monitor in Emergency Department for recurrence.  She is eager to go home and would like to avoid hospitalization if possible.  She does know that if she continues to trip and to episodes of SVT even after medication adjustments, plan will be for admission on cardiac monitor and cardiology consultation for further medication adjustments as needed.  Otherwise if after observation in the emergency department with no recurrence plan will be for discharge home she will follow-up with cardiology in the morning.   Able in normal sinus rhythm 60s 80s and normotensive for approximately 5 hours observation in the emergency department.  Patient eager to go home.  Given a prescription for metoprolol  and follow-up with Dr. Tresia office       FINAL CLINICAL IMPRESSION(S) / ED DIAGNOSES   Final diagnoses:  Palpitations  SVT (supraventricular tachycardia) (HCC)     Rx / DC Orders   ED Discharge Orders     None        Note:  This document was prepared using Dragon voice recognition software and may include unintentional dictation errors.    Cyrena Mylar, MD 09/28/23 JANESE    Cyrena Mylar, MD 09/28/23 [phone]

## 2023-09-28 ENCOUNTER — Telehealth: Payer: Self-pay | Admitting: Cardiovascular Disease

## 2023-09-28 DIAGNOSIS — I472 Ventricular tachycardia, unspecified: Secondary | ICD-10-CM | POA: Diagnosis not present

## 2023-09-28 DIAGNOSIS — I471 Supraventricular tachycardia, unspecified: Secondary | ICD-10-CM | POA: Diagnosis not present

## 2023-09-28 DIAGNOSIS — I700 Atherosclerosis of aorta: Secondary | ICD-10-CM | POA: Diagnosis not present

## 2023-09-28 LAB — MAGNESIUM: Magnesium: 2.4 mg/dL (ref 1.7–2.4)

## 2023-09-28 LAB — TROPONIN I (HIGH SENSITIVITY)
Troponin I (High Sensitivity): 13 ng/L (ref <18)
Troponin I (High Sensitivity): 20 ng/L — ABNORMAL HIGH (ref <18)

## 2023-09-28 LAB — CBC
HCT: 46.5 % — ABNORMAL HIGH (ref 36.0–46.0)
Hemoglobin: 15.2 g/dL — ABNORMAL HIGH (ref 12.0–15.0)
MCH: 28.4 pg (ref 26.0–34.0)
MCHC: 32.7 g/dL (ref 30.0–36.0)
MCV: 86.9 fL (ref 80.0–100.0)
Platelets: 266 10*3/uL (ref 150–400)
RBC: 5.35 MIL/uL — ABNORMAL HIGH (ref 3.87–5.11)
RDW: 13.4 % (ref 11.5–15.5)
WBC: 10.6 10*3/uL — ABNORMAL HIGH (ref 4.0–10.5)
nRBC: 0 % (ref 0.0–0.2)

## 2023-09-28 LAB — BASIC METABOLIC PANEL
Anion gap: 14 (ref 5–15)
BUN: 31 mg/dL — ABNORMAL HIGH (ref 8–23)
CO2: 22 mmol/L (ref 22–32)
Calcium: 9.4 mg/dL (ref 8.9–10.3)
Chloride: 98 mmol/L (ref 98–111)
Creatinine, Ser: 0.61 mg/dL (ref 0.44–1.00)
GFR, Estimated: >60 mL/min (ref >60)
Glucose, Bld: 177 mg/dL — ABNORMAL HIGH (ref 70–99)
Potassium: 3.5 mmol/L (ref 3.5–5.1)
Sodium: 134 mmol/L — ABNORMAL LOW (ref 135–145)

## 2023-09-28 LAB — TSH: TSH: 5.929 u[IU]/mL — ABNORMAL HIGH (ref 0.350–4.500)

## 2023-09-28 LAB — T4, FREE: Free T4: 1.35 ng/dL — ABNORMAL HIGH (ref 0.61–1.12)

## 2023-09-28 MED ORDER — SODIUM CHLORIDE 0.9 % IV BOLUS
1000.0000 mL | Freq: Once | INTRAVENOUS | Status: AC
  Administered 2023-09-28: 1000 mL via INTRAVENOUS

## 2023-09-28 MED ORDER — METOPROLOL SUCCINATE ER 25 MG PO TB24: 25.0000 mg | ORAL_TABLET | Freq: Every day | ORAL | 11 refills | Status: DC

## 2023-09-28 MED ORDER — METOPROLOL TARTRATE 25 MG PO TABS
25.0000 mg | ORAL_TABLET | Freq: Once | ORAL | Status: AC
  Administered 2023-09-28: 25 mg via ORAL
  Filled 2023-09-28: qty 1

## 2023-09-28 MED ORDER — METOPROLOL TARTRATE 5 MG/5ML IV SOLN
INTRAVENOUS | Status: AC
  Administered 2023-09-28: 5 mg
  Filled 2023-09-28: qty 5

## 2023-09-28 NOTE — Discharge Instructions (Addendum)
 Call Dr. Tresia office in the morning let them know about your hospital stay and the new prescription for 25 mg daily of metoprolol .  Thank you for choosing us  for your health care today!  Please see your primary doctor this week for a follow up appointment.   If you have any new, worsening, or unexpected symptoms call your doctor right away or come back to the emergency department for reevaluation.  It was my pleasure to care for you today.   Ginnie EDISON Cyrena, MD

## 2023-09-28 NOTE — Telephone Encounter (Signed)
 Returned the call to the patient. She stated that she went to the ED yesterday for an SVT episode. She was able to be converted and was advised to take her Metoprolol  Succinate at home. She was prescribed this previously but never took it.   She was calling to update Dr. Gollan. She has been advised to take her medication as prescribed and call back if anything further is needed.

## 2023-09-28 NOTE — Telephone Encounter (Signed)
 Patient calling to see if Dr. Mariah Milling would like to see her after she has been to the ER. Please advise

## 2023-09-28 NOTE — ED Triage Notes (Signed)
 Pt arrived via [facility] from home with c/o chest pain intermittent throughout last 4-5 hours and worsening 1 hour prior to EMS arrival. EMS reports initial HR 198 in SVT. Pt received 6mg  Adenosine IV @ 2335 with conversion to sinus tach at a rate of 102bpm. Pt arrived A&O x4. Hx/o V-Tach with no reported implanted intervention.

## 2023-09-29 ENCOUNTER — Telehealth: Payer: Self-pay | Admitting: Cardiovascular Disease

## 2023-09-29 NOTE — Telephone Encounter (Signed)
 Pt c/o medication issue:  1. Name of Medication: metoprolol  succinate (TOPROL  XL) 25 MG 24 hr tablet telmisartan  (MICARDIS ) 80 MG tablet hydrochlorothiazide  (HYDRODIURIL ) 25 MG tablet  2. How are you currently taking this medication (dosage and times per day)? As written  3. Are you having a reaction (difficulty breathing--STAT)? No  4. What is your medication issue? Patient is calling to know if she should take the Metoprolol  while taking the Telmisartan  or Hydrochlorothiazide . Please advise.

## 2023-09-29 NOTE — Telephone Encounter (Signed)
 Left detailed message on patient's voicemail per DPR stating: "Please take all medications as prescribed as they work differently on the cardiovascular system."

## 2023-10-19 ENCOUNTER — Other Ambulatory Visit: Payer: Self-pay | Admitting: Gastroenterology

## 2023-10-19 ENCOUNTER — Other Ambulatory Visit: Payer: Self-pay | Admitting: Nurse Practitioner

## 2023-10-21 NOTE — Telephone Encounter (Signed)
Requested Prescriptions  Pending Prescriptions Disp Refills   Insulin Pen Needle (B-D ULTRAFINE III SHORT PEN) 31G X 8 MM MISC [Pharmacy Med Name: BD UF SHORT PEN NEEDLE 8MMX31G] 300 each 0    Sig: USE ONE NEEDLE DAILY TO INJECT INSULIN 4-5 TIMES A DAY.     Endocrinology: Diabetes - Testing Supplies Passed - 10/21/2023  8:06 AM      Passed - Valid encounter within last 12 months    Recent Outpatient Visits           1 month ago Insulin dependent type 2 diabetes mellitus (HCC)   [place] [facility] [place], Martha T, NP   4 months ago Insulin dependent type 2 diabetes mellitus (HCC)   [place] [facility] [place], Dorie Rank, NP   5 months ago Tinnitus of both ears   [place] [facility] [employer], [place] T, NP   7 months ago Insulin dependent type 2 diabetes mellitus (HCC)   [facility] [place], Dorie Rank, NP   11 months ago Medicare annual wellness visit, subsequent   [place] [facility] [place], Dorie Rank, NP       Future Appointments             In 1 month Cannady, Dorie Rank, NP [place] [facility], [facility]

## 2023-10-30 ENCOUNTER — Other Ambulatory Visit: Payer: Self-pay | Admitting: Nurse Practitioner

## 2023-10-31 NOTE — Telephone Encounter (Signed)
 Requested medications are due for refill today.  unsure  Requested medications are on the active medications list.  yes  Last refill. 07/25/2023  Future visit scheduled.   yes  Notes to clinic.  Pharmacy comment: Alternative Requested:THE PRESCRIBED MEDICATION IS NOT COVERED BY INSURANCE. PLEASE CONSIDER CHANGING TO ONE OF THE SUGGESTED COVERED ALTERNATIVES.    Requested Prescriptions  Pending Prescriptions Disp Refills   Insulin  Glargine (BASAGLAR KWIKPEN) 100 UNIT/ML [Pharmacy Med Name: BASAGLAR 100 UNIT/ML KWIKPEN]  0     Endocrinology:  Diabetes - Insulins Passed - 10/31/2023  5:12 PM      Passed - HBA1C is between 0 and 7.9 and within 180 days    HB A1C (BAYER DCA - WAIVED)  Date Value Ref Range Status  08/31/2023 6.2 (H) 4.8 - 5.6 % Final    Comment:             Prediabetes: 5.7 - 6.4          Diabetes: >6.4          Glycemic control for adults with diabetes: <7.0          Passed - Valid encounter within last 6 months    Recent Outpatient Visits           2 months ago Insulin  dependent type 2 diabetes mellitus (HCC)   [place] [facility] [place], [place] T, NP   4 months ago Insulin  dependent type 2 diabetes mellitus (HCC)   [place] [facility] [place], Lavelle Posey, NP   5 months ago Tinnitus of both ears   [place] [facility] [place], [place] T, NP   8 months ago Insulin  dependent type 2 diabetes mellitus (HCC)   [place] [facility] [place], Lavelle Posey, NP   11 months ago Medicare annual wellness visit, subsequent   [place] [facility] [place], Lavelle Posey, NP       Future Appointments             In 1 month Cannady, Lavelle Posey, NP [place] [employer], [facility]

## 2023-11-03 ENCOUNTER — Encounter: Payer: Self-pay | Admitting: Nurse Practitioner

## 2023-11-04 ENCOUNTER — Encounter: Payer: Self-pay | Admitting: Cardiovascular Disease

## 2023-11-27 NOTE — Patient Instructions (Signed)
 Be Involved in Caring For Your Health:  Taking Medications When medications are taken as directed, they can greatly improve your health. But if they are not taken as prescribed, they may not work. In some cases, not taking them correctly can be harmful. To help ensure your treatment remains effective and safe, understand your medications and how to take them. Bring your medications to each visit for review by your provider.  Your lab results, notes, and after visit summary will be available on My Chart. We strongly encourage you to use this feature. If lab results are abnormal the clinic will contact you with the appropriate steps. If the clinic does not contact you assume the results are satisfactory. You can always view your results on My Chart. If you have questions regarding your health or results, please contact the clinic during office hours. You can also ask questions on My Chart.  We at [facility] are grateful that you chose [place] to provide your care. We strive to provide evidence-based and compassionate care and are always looking for feedback. If you get a survey from the clinic please complete this so we can hear your opinions.  Diabetes Mellitus and Foot Care Diabetes, also called diabetes mellitus, may cause problems with your feet and legs because of poor blood flow (circulation). Poor circulation may make your skin: Become thinner and drier. Break more easily. Heal more slowly. Peel and crack. You may also have nerve damage (neuropathy). This can cause decreased feeling in your legs and feet. This means that you may not notice minor injuries to your feet that could lead to more serious problems. Finding and treating problems early is the best way to prevent future foot problems. How to care for your feet Foot hygiene  Wash your feet daily with warm water and mild soap. Do not use hot water. Then, pat your feet and the areas between your toes until they are fully dry. Do  not soak your feet. This can dry your skin. Trim your toenails straight across. Do not dig under them or around the cuticle. File the edges of your nails with an emery board or nail file. Apply a moisturizing lotion or petroleum jelly to the skin on your feet and to dry, brittle toenails. Use lotion that does not contain alcohol and is unscented. Do not apply lotion between your toes. Shoes and socks Wear clean socks or stockings every day. Make sure they are not too tight. Do not wear knee-high stockings. These may decrease blood flow to your legs. Wear shoes that fit well and have enough cushioning. Always look in your shoes before you put them on to be sure there are no objects inside. To break in new shoes, wear them for just a few hours a day. This prevents injuries on your feet. Wounds, scrapes, corns, and calluses  Check your feet daily for blisters, cuts, bruises, sores, and redness. If you cannot see the bottom of your feet, use a mirror or ask someone for help. Do not cut off corns or calluses or try to remove them with medicine. If you find a minor scrape, cut, or break in the skin on your feet, keep it and the skin around it clean and dry. You may clean these areas with mild soap and water. Do not clean the area with peroxide, alcohol, or iodine. If you have a wound, scrape, corn, or callus on your foot, look at it several times a day to make sure it  is healing and not infected. Check for: Redness, swelling, or pain. Fluid or blood. Warmth. Pus or a bad smell. General tips Do not cross your legs. This may decrease blood flow to your feet. Do not use heating pads or hot water bottles on your feet. They may burn your skin. If you have lost feeling in your feet or legs, you may not know this is happening until it is too late. Protect your feet from hot and cold by wearing shoes, such as at the beach or on hot pavement. Schedule a complete foot exam at least once a year or more often if  you have foot problems. Report any cuts, sores, or bruises to your health care provider right away. Where to find more information American Diabetes Association: diabetes.org Association of Diabetes Care & Education Specialists: diabeteseducator.org Contact a health care provider if: You have a condition that increases your risk of infection, and you have any cuts, sores, or bruises on your feet. You have an injury that is not healing. You have redness on your legs or feet. You feel burning or tingling in your legs or feet. You have pain or cramps in your legs and feet. Your legs or feet are numb. Your feet always feel cold. You have pain around any toenails. Get help right away if: You have a wound, scrape, corn, or callus on your foot and: You have signs of infection. You have a fever. You have a red line going up your leg. This information is not intended to replace advice given to you by your health care provider. Make sure you discuss any questions you have with your health care provider. Document Revised: 03/10/2022 Document Reviewed: 03/10/2022 Elsevier Patient Education  2024 [employer].

## 2023-11-30 ENCOUNTER — Ambulatory Visit (INDEPENDENT_AMBULATORY_CARE_PROVIDER_SITE_OTHER): Payer: Self-pay | Admitting: Nurse Practitioner

## 2023-11-30 ENCOUNTER — Encounter: Payer: Self-pay | Admitting: Nurse Practitioner

## 2023-11-30 VITALS — BP 131/65 | HR 74 | Temp 97.7°F | Ht 62.0 in | Wt 174.4 lb

## 2023-11-30 DIAGNOSIS — R011 Cardiac murmur, unspecified: Secondary | ICD-10-CM

## 2023-11-30 DIAGNOSIS — J4520 Mild intermittent asthma, uncomplicated: Secondary | ICD-10-CM | POA: Diagnosis not present

## 2023-11-30 DIAGNOSIS — K219 Gastro-esophageal reflux disease without esophagitis: Secondary | ICD-10-CM | POA: Diagnosis not present

## 2023-11-30 DIAGNOSIS — E1169 Type 2 diabetes mellitus with other specified complication: Secondary | ICD-10-CM | POA: Diagnosis not present

## 2023-11-30 DIAGNOSIS — E1159 Type 2 diabetes mellitus with other circulatory complications: Secondary | ICD-10-CM | POA: Diagnosis not present

## 2023-11-30 DIAGNOSIS — R7989 Other specified abnormal findings of blood chemistry: Secondary | ICD-10-CM

## 2023-11-30 DIAGNOSIS — G4733 Obstructive sleep apnea (adult) (pediatric): Secondary | ICD-10-CM

## 2023-11-30 DIAGNOSIS — E559 Vitamin D deficiency, unspecified: Secondary | ICD-10-CM | POA: Diagnosis not present

## 2023-11-30 DIAGNOSIS — E119 Type 2 diabetes mellitus without complications: Secondary | ICD-10-CM

## 2023-11-30 DIAGNOSIS — I479 Paroxysmal tachycardia, unspecified: Secondary | ICD-10-CM | POA: Diagnosis not present

## 2023-11-30 DIAGNOSIS — E6609 Other obesity due to excess calories: Secondary | ICD-10-CM

## 2023-11-30 DIAGNOSIS — E785 Hyperlipidemia, unspecified: Secondary | ICD-10-CM | POA: Diagnosis not present

## 2023-11-30 DIAGNOSIS — E538 Deficiency of other specified B group vitamins: Secondary | ICD-10-CM

## 2023-11-30 DIAGNOSIS — I152 Hypertension secondary to endocrine disorders: Secondary | ICD-10-CM | POA: Diagnosis not present

## 2023-11-30 DIAGNOSIS — M85851 Other specified disorders of bone density and structure, right thigh: Secondary | ICD-10-CM

## 2023-11-30 DIAGNOSIS — Z794 Long term (current) use of insulin: Secondary | ICD-10-CM | POA: Diagnosis not present

## 2023-11-30 LAB — MICROALBUMIN, URINE WAIVED
Creatinine, Urine Waived: 100 mg/dL (ref 10–300)
Microalb, Ur Waived: 30 mg/L — ABNORMAL HIGH (ref 0–19)

## 2023-11-30 LAB — BAYER DCA HB A1C WAIVED: HB A1C (BAYER DCA - WAIVED): 6.4 % — ABNORMAL HIGH (ref 4.8–5.6)

## 2023-11-30 MED ORDER — TELMISARTAN 80 MG PO TABS: 80.0000 mg | ORAL_TABLET | Freq: Every day | ORAL | 3 refills | Status: AC

## 2023-11-30 MED ORDER — ROSUVASTATIN CALCIUM 20 MG PO TABS: 20.0000 mg | ORAL_TABLET | Freq: Every day | ORAL | 3 refills | Status: DC

## 2023-11-30 MED ORDER — PANTOPRAZOLE SODIUM 20 MG PO TBEC
20.0000 mg | DELAYED_RELEASE_TABLET | Freq: Two times a day (BID) | ORAL | 2 refills | Status: DC
Start: 2023-11-30 — End: 2024-06-11

## 2023-11-30 NOTE — Assessment & Plan Note (Signed)
Chronic, stable.  Continue current medication regimen and adjust as needed.  Will send refills when requested as minimally uses inhalers. 

## 2023-11-30 NOTE — Assessment & Plan Note (Addendum)
 Noted on exam.  Asymptomatic at this time.  Reassuring work-up with cardiology in past.

## 2023-11-30 NOTE — Assessment & Plan Note (Signed)
Chronic, ongoing.  Will continue Rosuvastatin daily and adjust dose as needed.  Lipid panel today.

## 2023-11-30 NOTE — Assessment & Plan Note (Signed)
 BMI 31.90 with some loss presenting, praised for this.  Recommended eating smaller high protein, low fat meals more frequently and exercising 30 mins a day 5 times a week with a goal of 10-15lb weight loss in the next 3 months. Patient voiced their understanding and motivation to adhere to these recommendations.

## 2023-11-30 NOTE — Assessment & Plan Note (Signed)
 Ongoing.  Continue current supplement and plan for DEXA scan repeat in 2025 due to osteopenia. Recheck level today.

## 2023-11-30 NOTE — Assessment & Plan Note (Signed)
Chronic, does not consistently wear CPAP.  Recommend 100% use. 

## 2023-11-30 NOTE — Assessment & Plan Note (Signed)
 Chronic, stable with Protonix.  Continue this regimen and adjust as needed.  Check Mag level annually. Risks of PPI use were discussed with patient including bone loss, C. Diff diarrhea, pneumonia, infections, CKD, electrolyte abnormalities.  Verbalizes understanding and chooses to continue the medication.

## 2023-11-30 NOTE — Assessment & Plan Note (Signed)
Chronic, stable.  Recheck B12 level today and continue supplement. 

## 2023-11-30 NOTE — Progress Notes (Signed)
 BP 131/65   Pulse 74   Temp 97.7 F (36.5 C) (Oral)   Ht 5\' 2"  (1.575 m)   Wt 174 lb 6.4 oz (79.1 kg)   LMP 07/13/2000 (Approximate)   SpO2 97%   BMI 31.90 kg/m    Subjective:    Patient ID: Charlene Juarez, female    DOB: 04/02/1951, 73 y.o.   MRN: [id number]  HPI: Charlene Juarez is a 73 y.o. female  Chief Complaint  Patient presents with   Diabetes   Gastroesophageal Reflux   Hyperlipidemia   Hypertension   DIABETES In December A1c was 6.2%. Is taking Basaglar 40 units, Jardiance 25 MG, Metformin 1000 MG BID, Novolog as needed before meals (takes 10 units if needed before a meal). Continues Gabapentin 100 MG BID for neuropathy and B12 supplement. Dexcom = average over past 90 days has been 147, in range 75% of the time, 17% high, and 2% low.  Has lost 6 pounds since December.  History: Cannot take GLP1 or Mounjaro due to pancreatitis with these.  History of fatty liver noted on past imaging.  Hypoglycemic episodes:no Polydipsia/polyuria: no Visual disturbance: no Chest pain: no Paresthesias: no Glucose Monitoring: no             Accucheck frequency: daily              Fasting glucose:              Post prandial:             Evening:              Before meals: Taking Insulin?: yes             Long acting insulin: 46 units             Short acting insulin: 12 units Blood Pressure Monitoring: yes Retinal Examination: Up To Date -- [facility] Foot Exam: Up to Date Pneumovax: Up to Date Influenza: Up to Date Aspirin: no   HYPERTENSION / HYPERLIPIDEMIA Continues Telmisartan, HCTZ, and Crestor.  HCTZ was discontinued in past due to use of insulin, however she self restarted due to concern for ankle swelling a few months back.  Last visit with cardiology on 08/01/23 due to palpitations, overall reassuring work-up.  Went into ER on 09/27/23 for palpitations, was started Diltiazem and Metoprolol.  No consistent use of CPAP at home. Satisfied with  current treatment? yes Duration of hypertension: chronic BP monitoring frequency: not often BP range:  BP medication side effects: no Duration of hyperlipidemia: chronic Cholesterol medication side effects: no Cholesterol supplements: fish oil Medication compliance: good compliance Aspirin: no Recent stressors: no Recurrent headaches: no Visual changes: no Palpitations: yes, a couple light episodes after forgetting to take Diltiazem Dyspnea: no Chest pain: no Lower extremity edema: no Dizzy/lightheaded: yes if gets up too quickly The 10-year ASCVD risk score (Arnett DK, et al., 2019) is: 27.6%   Values used to calculate the score:     Age: 47 years     Sex: Female     Is Non-Hispanic African American: No     Diabetic: Yes     Tobacco smoker: No     Systolic Blood Pressure: 131 mmHg     Is BP treated: Yes     HDL Cholesterol: 43 mg/dL     Total Cholesterol: 138 mg/dL  ASTHMA Has Symbicort and Albuterol at home - was sick recently and used inhalers.  Takes Protonix BID for heart  burn with benefit. Asthma status: stable Satisfied with current treatment?: yes Albuterol/rescue inhaler frequency: used a bit recently due to illness Dyspnea frequency: no Wheezing frequency: no Cough frequency: no Nocturnal symptom frequency: no Limitation of activity: no Current upper respiratory symptoms:  improved Aerochamber/spacer use: no Visits to ER or Urgent Care in past year: yes Pneumovax: Up to Date Influenza: Up to Date   OSTEOPENIA Takes Vitamin D daily.   Satisfied with current treatment?: yes Adequate calcium & vitamin D: yes Weight bearing exercises: yes   Relevant past medical, surgical, family and social history reviewed and updated as indicated. Interim medical history since our last visit reviewed. Allergies and medications reviewed and updated.  Review of Systems  Constitutional:  Negative for activity change, appetite change, diaphoresis, fatigue and fever.  HENT:   Negative for ear discharge and ear pain.   Respiratory:  Negative for cough, chest tightness, shortness of breath and wheezing.   Cardiovascular:  Negative for chest pain, palpitations and leg swelling.  Endocrine: Negative for polydipsia, polyphagia and polyuria.  Neurological: Negative.   Psychiatric/Behavioral: Negative.     Per HPI unless specifically indicated above     Objective:    BP 131/65   Pulse 74   Temp 97.7 F (36.5 C) (Oral)   Ht 5\' 2"  (1.575 m)   Wt 174 lb 6.4 oz (79.1 kg)   LMP 07/13/2000 (Approximate)   SpO2 97%   BMI 31.90 kg/m   Wt Readings from Last 3 Encounters:  11/30/23 174 lb 6.4 oz (79.1 kg)  09/27/23 175 lb (79.4 kg)  08/31/23 181 lb 3.2 oz (82.2 kg)    Physical Exam Vitals and nursing note reviewed.  Constitutional:      General: She is awake. She is not in acute distress.    Appearance: Normal appearance. She is well-developed and well-groomed. She is obese. She is not ill-appearing or toxic-appearing.  HENT:     Head: Normocephalic.     Right Ear: Hearing and external ear normal.     Left Ear: Hearing and external ear normal.  Eyes:     General: Lids are normal.        Right eye: No discharge.        Left eye: No discharge.     Conjunctiva/sclera: Conjunctivae normal.     Pupils: Pupils are equal, round, and reactive to light.  Neck:     Thyroid: No thyromegaly.     Vascular: No carotid bruit.  Cardiovascular:     Rate and Rhythm: Normal rate and regular rhythm.     Heart sounds: Murmur heard.     Systolic murmur is present with a grade of 2/6.     No gallop.  Pulmonary:     Effort: Pulmonary effort is normal. No accessory muscle usage or respiratory distress.     Breath sounds: Normal breath sounds.  Abdominal:     General: Bowel sounds are normal. There is no distension.     Palpations: Abdomen is soft.     Tenderness: There is no abdominal tenderness.  Musculoskeletal:     Cervical back: Normal range of motion and neck  supple.     Right lower leg: No edema.     Left lower leg: No edema.  Lymphadenopathy:     Cervical: No cervical adenopathy.  Skin:    General: Skin is warm and dry.  Neurological:     Mental Status: She is alert and oriented to person, place, and time.  Deep Tendon Reflexes: Reflexes are normal and symmetric.     Reflex Scores:      Brachioradialis reflexes are 2+ on the right side and 2+ on the left side.      Patellar reflexes are 2+ on the right side and 2+ on the left side. Psychiatric:        Attention and Perception: Attention normal.        Mood and Affect: Mood normal.        Speech: Speech normal.        Behavior: Behavior normal. Behavior is cooperative.        Thought Content: Thought content normal.    Diabetic Foot Exam - Simple   Simple Foot Form Visual Inspection No deformities, no ulcerations, no other skin breakdown bilaterally: Yes Sensation Testing Intact to touch and monofilament testing bilaterally: Yes Pulse Check Posterior Tibialis and Dorsalis pulse intact bilaterally: Yes Comments     Results for orders placed or performed during the hospital encounter of 09/27/23  Basic metabolic panel   Collection Time: 09/27/23 11:52 PM  Result Value Ref Range   Sodium 134 (L) 135 - 145 mmol/L   Potassium 3.5 3.5 - 5.1 mmol/L   Chloride 98 98 - 111 mmol/L   CO2 22 22 - 32 mmol/L   Glucose, Bld 177 (H) 70 - 99 mg/dL   BUN 31 (H) 8 - 23 mg/dL   Creatinine, Ser 0.98 0.44 - 1.00 mg/dL   Calcium 9.4 8.9 - 11.9 mg/dL   GFR, Estimated >14 >78 mL/min   Anion gap 14 5 - 15  CBC   Collection Time: 09/27/23 11:52 PM  Result Value Ref Range   WBC 10.6 (H) 4.0 - 10.5 K/uL   RBC 5.35 (H) 3.87 - 5.11 MIL/uL   Hemoglobin 15.2 (H) 12.0 - 15.0 g/dL   HCT 29.5 (H) 62.1 - 30.8 %   MCV 86.9 80.0 - 100.0 fL   MCH 28.4 26.0 - 34.0 pg   MCHC 32.7 30.0 - 36.0 g/dL   RDW 65.7 84.6 - 96.2 %   Platelets 266 150 - 400 K/uL   nRBC 0.0 0.0 - 0.2 %  Magnesium   Collection  Time: 09/27/23 11:52 PM  Result Value Ref Range   Magnesium 2.4 1.7 - 2.4 mg/dL  TSH   Collection Time: 09/27/23 11:52 PM  Result Value Ref Range   TSH 5.929 (H) 0.350 - 4.500 uIU/mL  T4, free   Collection Time: 09/27/23 11:52 PM  Result Value Ref Range   Free T4 1.35 (H) 0.61 - 1.12 ng/dL  Troponin I (High Sensitivity)   Collection Time: 09/27/23 11:52 PM  Result Value Ref Range   Troponin I (High Sensitivity) 13 <18 ng/L  Troponin I (High Sensitivity)   Collection Time: 09/28/23  1:53 AM  Result Value Ref Range   Troponin I (High Sensitivity) 20 (H) <18 ng/L      Assessment & Plan:   Problem List Items Addressed This Visit       Cardiovascular and Mediastinum   Hypertension associated with diabetes (HCC)   Chronic, stable.  BP at goal in office and at home.  Continue Telmisartan for kidney and hydrochlorothiazide for fluid retention, she is aware this medication could diminish effects of insulin == may need to consider Amlodipine if BP trends up in future.  LABS: CBC, CMP.  Urine ALB 18 December 2023.  Recommend she continue to check BP at home on occasion and document for visits. Focus  on DASH diet.  Return in 3 months.      Relevant Medications   rosuvastatin (CRESTOR) 20 MG tablet   telmisartan (MICARDIS) 80 MG tablet   Other Relevant Orders   Bayer DCA Hb A1c Waived   Microalbumin, Urine Waived   Comprehensive metabolic panel   Paroxysmal tachycardia (HCC)   Chronic, stable at present with current medication regimen.  Continue this regimen and recommend she schedule a follow-up with cardiology.      Relevant Medications   rosuvastatin (CRESTOR) 20 MG tablet   telmisartan (MICARDIS) 80 MG tablet     Respiratory   Asthma, intermittent   Chronic, stable.  Continue current medication regimen and adjust as needed.  Will send refills when requested as minimally uses inhalers.      Relevant Orders   CBC with Differential/Platelet   OSA (obstructive sleep apnea)    Chronic, does not consistently wear CPAP.  Recommend 100% use.      Relevant Orders   Comprehensive metabolic panel     Digestive   GERD (gastroesophageal reflux disease)   Chronic, stable with Protonix.  Continue this regimen and adjust as needed.  Check Mag level annually. Risks of PPI use were discussed with patient including bone loss, C. Diff diarrhea, pneumonia, infections, CKD, electrolyte abnormalities.  Verbalizes understanding and chooses to continue the medication.       Relevant Medications   pantoprazole (PROTONIX) 20 MG tablet   Other Relevant Orders   Magnesium     Endocrine   Hyperlipidemia associated with type 2 diabetes mellitus (HCC)   Chronic, ongoing.  Will continue Rosuvastatin daily and adjust dose as needed.  Lipid panel today.        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  Urine ALB 18 December 2023.  A1c 6.4% today, remaining stable.  Pancreatitis presented with Mounjaro & Ozempic.  Had tolerated Ozempic well for years.  Uses Dexcom. - At this time will maintain off GLP1 or Mounjaro due to pancreatitis in past. - Continue Jardiance and Metformin. Continue Basaglar 40 units, reduce this if morning BS consistently less then 130 or lows <70 present - discussed at length with her.  Continue Novolog pre-meals 5-12 units as needed only (not to take consistently), educated her on this and not to take if does not eat a meal or take after meals. - Dexcom continues.  Continue to work with [facility] on assistance programs. - Check blood sugar 4-5 times a day and document for provider.   - Foot and eye exams up to date - Statin and ARB on board. - Flu shot and Pneumonia vaccines up to date. Return in 3 months.       Relevant Medications   rosuvastatin (CRESTOR) 20 MG tablet   telmisartan (MICARDIS) 80 MG tablet   Other Relevant Orders   Bayer DCA Hb A1c Waived   Microalbumin, Urine Waived     Musculoskeletal and Integument   Osteopenia of neck of right femur   Chronic.  Noted on Dexa in 2020.  Repeat Dexa 2025.  Continue daily Vit D and calcium. Check levels today.      Relevant Orders   VITAMIN D 25 Hydroxy (Vit-D Deficiency, Fractures)  Other   B12 deficiency   Chronic, stable.  Recheck B12 level today and continue supplement.      Relevant Orders   Vitamin B12   Elevated LFTs   Due to past GLP1 use.  Recheck CMP today.  Recent had improved.      Obesity   BMI 31.90 with some loss presenting, praised for this.  Recommended eating smaller high protein, low fat meals more frequently and exercising 30 mins a day 5 times a week with a goal of 10-15lb weight loss in the next 3 months. Patient voiced their understanding and motivation to adhere to these recommendations.       Systolic murmur   Noted on exam.  Asymptomatic at this time.  Reassuring work-up with cardiology in past.        Follow up plan: Return in about 3 months (around 03/01/2024) for T2DM, HTN/HLD.

## 2023-11-30 NOTE — Assessment & Plan Note (Signed)
 Due to past GLP1 use.  Recheck CMP today.  Recent had improved.

## 2023-11-30 NOTE — Assessment & Plan Note (Signed)
 Chronic, ongoing.  Urine ALB 18 December 2023.  A1c 6.4% today, remaining stable.  Pancreatitis presented with Mounjaro & Ozempic.  Had tolerated Ozempic well for years.  Uses Dexcom. - At this time will maintain off GLP1 or Mounjaro due to pancreatitis in past. - Continue Jardiance and Metformin. Continue Basaglar 40 units, reduce this if morning BS consistently less then 130 or lows <70 present - discussed at length with her.  Continue Novolog pre-meals 5-12 units as needed only (not to take consistently), educated her on this and not to take if does not eat a meal or take after meals. - Dexcom continues.  Continue to work with [facility] Pharm[facility] on assistance programs. - Check blood sugar 4-5 times a day and document for provider.   - Foot and eye exams up to date - Statin and ARB on board. - Flu shot and Pneumonia vaccines up to date. Return in 3 months.

## 2023-11-30 NOTE — Assessment & Plan Note (Signed)
Chronic.  Noted on Dexa in 2020.  Repeat Dexa 2025.  Continue daily Vit D and calcium. Check levels today. 

## 2023-11-30 NOTE — Assessment & Plan Note (Signed)
 Chronic, stable.  BP at goal in office and at home.  Continue Telmisartan for kidney and hydrochlorothiazide for fluid retention, she is aware this medication could diminish effects of insulin == may need to consider Amlodipine if BP trends up in future.  LABS: CBC, CMP.  Urine ALB 18 December 2023.  Recommend she continue to check BP at home on occasion and document for visits. Focus on DASH diet.  Return in 3 months.

## 2023-11-30 NOTE — Assessment & Plan Note (Signed)
 Chronic, stable at present with current medication regimen.  Continue this regimen and recommend she schedule a follow-up with cardiology.

## 2023-12-01 ENCOUNTER — Encounter: Payer: Self-pay | Admitting: Nurse Practitioner

## 2023-12-01 LAB — VITAMIN B12: Vitamin B-12: 583 pg/mL (ref 232–1245)

## 2023-12-01 LAB — COMPREHENSIVE METABOLIC PANEL
ALT: 23 IU/L (ref 0–32)
AST: 21 IU/L (ref 0–40)
Albumin: 4.5 g/dL (ref 3.8–4.8)
Alkaline Phosphatase: 60 IU/L (ref 44–121)
BUN/Creatinine Ratio: 21 (ref 12–28)
BUN: 16 mg/dL (ref 8–27)
Bilirubin Total: 0.5 mg/dL (ref 0.0–1.2)
CO2: 25 mmol/L (ref 20–29)
Calcium: 9.8 mg/dL (ref 8.7–10.3)
Chloride: 94 mmol/L — ABNORMAL LOW (ref 96–106)
Creatinine, Ser: 0.78 mg/dL (ref 0.57–1.00)
Globulin, Total: 2.5 g/dL (ref 1.5–4.5)
Glucose: 147 mg/dL — ABNORMAL HIGH (ref 70–99)
Potassium: 3.8 mmol/L (ref 3.5–5.2)
Sodium: 135 mmol/L (ref 134–144)
Total Protein: 7 g/dL (ref 6.0–8.5)
eGFR: 81 mL/min/{1.73_m2} (ref >59)

## 2023-12-01 LAB — CBC WITH DIFFERENTIAL/PLATELET
Basophils Absolute: 0 10*3/uL (ref 0.0–0.2)
Basos: 0 %
EOS (ABSOLUTE): 0.2 10*3/uL (ref 0.0–0.4)
Eos: 3 %
Hematocrit: 45.4 % (ref 34.0–46.6)
Hemoglobin: 14.9 g/dL (ref 11.1–15.9)
Immature Grans (Abs): 0 10*3/uL (ref 0.0–0.1)
Immature Granulocytes: 0 %
Lymphocytes Absolute: 2.3 10*3/uL (ref 0.7–3.1)
Lymphs: 29 %
MCH: 28.5 pg (ref 26.6–33.0)
MCHC: 32.8 g/dL (ref 31.5–35.7)
MCV: 87 fL (ref 79–97)
Monocytes Absolute: 0.8 10*3/uL (ref 0.1–0.9)
Monocytes: 10 %
Neutrophils Absolute: 4.5 10*3/uL (ref 1.4–7.0)
Neutrophils: 58 %
Platelets: 268 10*3/uL (ref 150–450)
RBC: 5.23 x10E6/uL (ref 3.77–5.28)
RDW: 14.1 % (ref 11.7–15.4)
WBC: 7.8 10*3/uL (ref 3.4–10.8)

## 2023-12-01 LAB — LIPID PANEL W/O CHOL/HDL RATIO
Cholesterol, Total: 268 mg/dL — ABNORMAL HIGH (ref 100–199)
HDL: 33 mg/dL — ABNORMAL LOW (ref >39)
LDL Chol Calc (NIH): 178 mg/dL — ABNORMAL HIGH (ref 0–99)
Triglycerides: 292 mg/dL — ABNORMAL HIGH (ref 0–149)
VLDL Cholesterol Cal: 57 mg/dL — ABNORMAL HIGH (ref 5–40)

## 2023-12-01 LAB — MAGNESIUM: Magnesium: 2 mg/dL (ref 1.6–2.3)

## 2023-12-01 LAB — VITAMIN D 25 HYDROXY (VIT D DEFICIENCY, FRACTURES): Vit D, 25-Hydroxy: 66 ng/mL (ref 30.0–100.0)

## 2023-12-01 NOTE — Progress Notes (Signed)
 Contacted via MyChart   Good morning Charlene Juarez, your labs have returned: - Kidney function, creatinine and eGFR, remains normal, as is liver function, AST and ALT.  - Your lipid panel is showing significant elevations from previous.  Are you taking Rosuvastatin every day? Were you fasting?  Please let me know.  If you are taking medication we may need to increase dose. - Remainder of labs look great.  Any questions? Keep being amazing!!  Thank you for allowing me to participate in your care.  I appreciate you. Kindest regards, Genisis Sonnier

## 2023-12-02 MED ORDER — ROSUVASTATIN CALCIUM 40 MG PO TABS: 40.0000 mg | ORAL_TABLET | Freq: Every day | ORAL | 3 refills | Status: AC

## 2023-12-10 ENCOUNTER — Other Ambulatory Visit: Payer: Self-pay | Admitting: Nurse Practitioner

## 2023-12-12 NOTE — Telephone Encounter (Signed)
 Requested Prescriptions  Pending Prescriptions Disp Refills   JARDIANCE 25 MG TABS tablet [Pharmacy Med Name: JARDIANCE 25 MG TABLET] 90 tablet 0    Sig: TAKE 1 TABLET (25 MG TOTAL) BY MOUTH DAILY.     Endocrinology:  Diabetes - SGLT2 Inhibitors Passed - 12/12/2023  2:11 PM      Passed - Cr in normal range and within 360 days    Creatinine, Ser  Date Value Ref Range Status  11/30/2023 0.78 0.57 - 1.00 mg/dL Final         Passed - HBA1C is between 0 and 7.9 and within 180 days    HB A1C (BAYER DCA - WAIVED)  Date Value Ref Range Status  11/30/2023 6.4 (H) 4.8 - 5.6 % Final    Comment:             Prediabetes: 5.7 - 6.4          Diabetes: >6.4          Glycemic control for adults with diabetes: <7.0          Passed - eGFR in normal range and within 360 days    GFR calc Af Amer  Date Value Ref Range Status  05/08/2020 95 >59 mL/min/1.73 Final    Comment:    **[facility] currently reports eGFR in compliance with the current**   recommendations of the [employer]. [facility] will   update reporting as new guidelines are published from the NKF-ASN   Task force.    GFR, Estimated  Date Value Ref Range Status  09/27/2023 >60 >60 mL/min Final    Comment:    (NOTE) Calculated using the CKD-EPI Creatinine Equation (2021)    eGFR  Date Value Ref Range Status  11/30/2023 81 >59 mL/min/1.73 Final         Passed - Valid encounter within last 6 months    Recent Outpatient Visits           3 months ago Insulin dependent type 2 diabetes mellitus (HCC)   [place] [facility] [place], [place] T, NP   6 months ago Insulin dependent type 2 diabetes mellitus (HCC)   [place] [facility] [place], Corrie Dandy T, NP   7 months ago Tinnitus of both ears   [place] [facility] [place], [place] T, NP   9 months ago Insulin dependent type 2 diabetes mellitus (HCC)   [place] [facility] [place], Corrie Dandy T, NP   1 year ago  Medicare annual wellness visit, subsequent   [facility] [place], [place] T, NP               insulin aspart (NOVOLOG FLEXPEN) 100 UNIT/ML FlexPen [Pharmacy Med Name: NOVOLOG 100 UNIT/ML FLEXPEN] 15 mL 3    Sig: INJECT 10-12 UNITS INTO THE SKIN 3 (THREE) TIMES DAILY WITH MEALS.     Endocrinology:  Diabetes - Insulins Passed - 12/12/2023  2:11 PM      Passed - HBA1C is between 0 and 7.9 and within 180 days    HB A1C (BAYER DCA - WAIVED)  Date Value Ref Range Status  11/30/2023 6.4 (H) 4.8 - 5.6 % Final    Comment:             Prediabetes: 5.7 - 6.4          Diabetes: >6.4          Glycemic control for adults with diabetes: <7.0  Passed - Valid encounter within last 6 months    Recent Outpatient Visits           3 months ago Insulin dependent type 2 diabetes mellitus (HCC)   [place] [facility] [place], [place] T, NP   6 months ago Insulin dependent type 2 diabetes mellitus (HCC)   [place] [facility] [place], Corrie Dandy T, NP   7 months ago Tinnitus of both ears    [facility] [place], [place] T, NP   9 months ago Insulin dependent type 2 diabetes mellitus (HCC)    [facility] [place], Corrie Dandy T, NP   1 year ago Medicare annual wellness visit, subsequent    [place] [facility] [place], Dorie Rank, NP

## 2023-12-29 ENCOUNTER — Ambulatory Visit (INDEPENDENT_AMBULATORY_CARE_PROVIDER_SITE_OTHER): Payer: Self-pay | Admitting: Emergency Medicine

## 2023-12-29 VITALS — Ht 62.0 in | Wt 175.0 lb

## 2023-12-29 DIAGNOSIS — Z1231 Encounter for screening mammogram for malignant neoplasm of breast: Secondary | ICD-10-CM

## 2023-12-29 DIAGNOSIS — Z0000 Encounter for general adult medical examination without abnormal findings: Secondary | ICD-10-CM

## 2023-12-29 DIAGNOSIS — Z780 Asymptomatic menopausal state: Secondary | ICD-10-CM | POA: Diagnosis not present

## 2023-12-29 NOTE — Patient Instructions (Addendum)
 Charlene Juarez , Thank you for taking time to come for your Medicare Wellness Visit. I appreciate your ongoing commitment to your health goals. Please review the following plan we discussed and let me know if I can assist you in the future.   Referrals/Orders/Follow-Ups/Clinician Recommendations:   You may take 2 of the  20mg  tablets of Rosuvastatin to equal 40mg  total.  I have placed orders for a mammogram (due 08/29/24) and bone density test. Call [facility] @ [phone] to schedule at your convenience.   This is a list of the screening recommended for you and due dates:  Health Maintenance  Topic Date Due   Flu Shot  04/20/2024   DEXA scan (bone density measurement)  05/22/2024   Hemoglobin A1C  06/01/2024   Eye exam for diabetics  08/02/2024   Mammogram  08/29/2024   Yearly kidney function blood test for diabetes  11/29/2024   Yearly kidney health urinalysis for diabetes  11/29/2024   Complete foot exam   11/29/2024   Medicare Annual Wellness Visit  12/28/2024   DTaP/Tdap/Td vaccine (3 - Td or Tdap) 01/28/2030   Colon Cancer Screening  12/08/2032   Pneumonia Vaccine  Completed   Hepatitis C Screening  Completed   Zoster (Shingles) Vaccine  Completed   HPV Vaccine  Aged Out   Meningitis B Vaccine  Aged Out   COVID-19 Vaccine  Discontinued    Advanced directives: (ACP Link)Information on Advanced Care Planning can be found at [facility] [place] Advance Health Care Directives Advance Health Care Directives. [email or web address] You may also get these forms at your doctor's office.  Once you have completed the forms, please bring a copy of your health care power of attorney and living will to the office to be added to your chart at your convenience.   Next Medicare Annual Wellness Visit scheduled for next year: Yes, 01/08/25 @ 9:20am (phone visit)

## 2023-12-29 NOTE — Progress Notes (Signed)
 Subjective:   Charlene Juarez is a 73 y.o. who presents for a Medicare Wellness preventive visit.  Visit Complete: Virtual I connected with  [name] on 12/29/23 by a audio enabled telemedicine application and verified that I am speaking with the correct person using two identifiers.  Patient Location: Home  Provider Location: Home Office  I discussed the limitations of evaluation and management by telemedicine. The patient expressed understanding and agreed to proceed.  Vital Signs: Because this visit was a virtual/telehealth visit, some criteria may be missing or patient reported. Any vitals not documented were not able to be obtained and vitals that have been documented are patient reported.  VideoDeclined- This patient declined Librarian, academic. Therefore the visit was completed with audio only.  Persons Participating in Visit: Patient.  AWV Questionnaire: No: Patient Medicare AWV questionnaire was not completed prior to this visit.  Cardiac Risk Factors include: advanced age (>61men, >16 women);hypertension;diabetes mellitus;dyslipidemia;obesity (BMI >30kg/m2);Other (see comment), Risk factor comments: OSA (has cpap, but doesn't use)     Objective:    Today's Vitals   12/29/23 1612  Weight: 175 lb (79.4 kg)  Height: 5\' 2"  (1.575 m)   Body mass index is 32.01 kg/m.     12/29/2023    4:37 PM 09/27/2023   11:48 PM 12/27/2022   10:35 AM 12/09/2022    8:51 AM 05/23/2022    6:14 AM 05/22/2022   10:12 PM 01/06/2022   10:06 AM  Advanced Directives  Does Patient Have a Medical Advance Directive? No No No No No No No  Would patient like information on creating a medical advance directive? Yes (MAU/Ambulatory/Procedural Areas - Information given) No - Patient declined No - Patient declined  No - Patient declined No - Patient declined     Current Medications (verified) Outpatient Encounter Medications as of 12/29/2023  Medication  Sig   albuterol (VENTOLIN HFA) 108 (90 Base) MCG/ACT inhaler TAKE 2 PUFFS BY MOUTH EVERY 6 HOURS AS NEEDED FOR WHEEZE OR SHORTNESS OF BREATH   Ascorbic Acid (VITAMIN C) 1000 MG tablet Take 1,000 mg by mouth daily.   Cholecalciferol (VITAMIN D3) 1000 units CAPS Take 1,000 Units by mouth at bedtime.   Continuous Blood Gluc Receiver (DEXCOM G7 RECEIVER) DEVI For continuous blood sugar monitoring as injecting insulin 4 times a day and over age 56.  Dx E11.9, Z79.4   CONTOUR TEST test strip USE TO TEST BLOOD SUGAR ONCE A DAY == DX E11.69   diltiazem (CARDIZEM) 60 MG tablet Take 1 tablet (60 mg total) by mouth 3 (three) times daily as needed. For tachycardia   gabapentin (NEURONTIN) 100 MG capsule Take 1 capsule (100 mg total) by mouth 3 (three) times daily.   Ginkgo Biloba 40 MG TABS Take 1 tablet by mouth daily.   hydrochlorothiazide (HYDRODIURIL) 25 MG tablet Take 1 tablet (25 mg total) by mouth daily.   insulin aspart (NOVOLOG FLEXPEN) 100 UNIT/ML FlexPen INJECT 10-12 UNITS INTO THE SKIN 3 (THREE) TIMES DAILY WITH MEALS.   Insulin Glargine (BASAGLAR KWIKPEN) 100 UNIT/ML Inject 50 Units into the skin at bedtime.   Insulin Pen Needle (B-D ULTRAFINE III SHORT PEN) 31G X 8 MM MISC USE ONE NEEDLE DAILY TO INJECT INSULIN 4-5 TIMES A DAY.   JARDIANCE 25 MG TABS tablet TAKE 1 TABLET (25 MG TOTAL) BY MOUTH DAILY.   Magnesium 500 MG TABS Take 500 mg by mouth at bedtime.   metFORMIN (GLUCOPHAGE) 1000 MG tablet TAKE 1 TABLET (  1,000 MG TOTAL) BY MOUTH TWICE A DAY WITH FOOD   metoprolol succinate (TOPROL XL) 25 MG 24 hr tablet Take 1 tablet (25 mg total) by mouth daily. Take with or immediately following a meal.   Omega-3 Fatty Acids (FISH OIL) 1200 MG CAPS Take 1,200 mg by mouth daily.   pantoprazole (PROTONIX) 20 MG tablet Take 1 tablet (20 mg total) by mouth 2 (two) times daily.   polyethylene glycol (MIRALAX / GLYCOLAX) 17 g packet Take 17 g by mouth daily as needed for moderate constipation.   rosuvastatin  (CRESTOR) 40 MG tablet Take 1 tablet (40 mg total) by mouth daily.   SYMBICORT 160-4.5 MCG/ACT inhaler TAKE 2 PUFFS BY MOUTH TWICE A DAY   telmisartan (MICARDIS) 80 MG tablet Take 1 tablet (80 mg total) by mouth daily.   Continuous Blood Gluc Sensor (DEXCOM G7 SENSOR) MISC For continuous blood sugar monitoring as injecting insulin 4 times a day and over age 16. Dx E11.9, Z79.4 (Patient not taking: Reported on 12/29/2023)   rosuvastatin (CRESTOR) 20 MG tablet Take 20 mg by mouth daily. (Patient not taking: Reported on 12/29/2023)   No facility-administered encounter medications on file as of 12/29/2023.    Allergies (verified) Mounjaro [tirzepatide], Ozempic (0.25 or 0.5 mg-dose) [semaglutide(0.25 or 0.5mg -dos)], Codeine sulfate, and Lisinopril   History: Past Medical History:  Diagnosis Date   Arthritis    Bursitis    Cancer (HCC)    SKIN   Diabetes mellitus without complication (HCC)    GERD (gastroesophageal reflux disease)    Hyperlipidemia    Hypertension    Sleep apnea    Past Surgical History:  Procedure Laterality Date   cancer removal off nose     CATARACT EXTRACTION W/PHACO Right 01/13/2017   Procedure: CATARACT EXTRACTION PHACO AND INTRAOCULAR LENS PLACEMENT (IOC);  Surgeon: Nevada Crane, MD;  Location: [facility] ORS;  Service: Ophthalmology;  Laterality: Right;  [place] 31.1 AP% 0.0 CDE 1.78 Fluid Pack lot # [id number]   CATARACT EXTRACTION W/PHACO Left 02/24/2017   Procedure: CATARACT EXTRACTION PHACO AND INTRAOCULAR LENS PLACEMENT (IOC);  Surgeon: Nevada Crane, MD;  Location: [facility] ORS;  Service: Ophthalmology;  Laterality: Left;  [place]  00:45.5 AP  0.7 CDE   2.76  fluid pack lot # [id number]  exp. 08-19-2018   COLONOSCOPY WITH PROPOFOL N/A 12/09/2022   Procedure: COLONOSCOPY WITH PROPOFOL;  Surgeon: Wyline Mood, MD;  Location: [facility];  Service: Gastroenterology;  Laterality: N/A;   DILATION AND CURETTAGE OF UTERUS     ESOPHAGOGASTRODUODENOSCOPY (EGD) WITH PROPOFOL  N/A 01/06/2022   Procedure: ESOPHAGOGASTRODUODENOSCOPY (EGD) WITH PROPOFOL;  Surgeon: Wyline Mood, MD;  Location: [facility] ENDOSCOPY;  Service: Gastroenterology;  Laterality: N/A;   EYE SURGERY     rotator cuff surgery     TUBAL LIGATION     Family History  Problem Relation Age of Onset   Alcohol abuse Mother    Heart disease Father    Alcohol abuse Father    Diabetes Sister    Hyperlipidemia Sister    Diabetes Brother    Hypertension Brother    Thyroid disease Daughter    Diabetes Maternal Grandmother    Hypertension Sister    Aneurysm Sister    Diabetes Sister    Diabetes Sister    Breast cancer Maternal Aunt    Social History   Socioeconomic History   Marital status: Married    Spouse name: Maisie Fus   Number of children: 4   Years of education: Not on  file   Highest education level: Associate degree: occupational, Scientist, product/process development, or vocational program  Occupational History   Occupation: retired Product manager  Tobacco Use   Smoking status: Never    Passive exposure: Never   Smokeless tobacco: Never  Vaping Use   Vaping status: Never Used  Substance and Sexual Activity   Alcohol use: No    Alcohol/week: 0.0 standard drinks of alcohol   Drug use: Never   Sexual activity: Yes  Other Topics Concern   Not on file  Social History Narrative   Not on file   Social Drivers of Health   Financial Resource Strain: Low Risk  (12/29/2023)   Overall Financial Resource Strain (CARDIA)    Difficulty of Paying Living Expenses: Not hard at all  Food Insecurity: No Food Insecurity (12/29/2023)   Hunger Vital Sign    Worried About Running Out of Food in the Last Year: Never true    Ran Out of Food in the Last Year: Never true  Transportation Needs: No Transportation Needs (12/29/2023)   PRAPARE - Administrator, Civil Service (Medical): No    Lack of Transportation (Non-Medical): No  Physical Activity: Sufficiently Active (12/29/2023)   Exercise Vital Sign    Days of Exercise per  Week: 5 days    Minutes of Exercise per Session: 30 min  Recent Concern: Physical Activity - Insufficiently Active (11/29/2023)   Exercise Vital Sign    Days of Exercise per Week: 3 days    Minutes of Exercise per Session: 10 min  Stress: No Stress Concern Present (12/29/2023)   [employer] of Occupational Health - Occupational Stress Questionnaire    Feeling of Stress : Only a little  Social Connections: Moderately Integrated (12/29/2023)   Social Connection and Isolation Panel [NHANES]    Frequency of Communication with Friends and Family: More than three times a week    Frequency of Social Gatherings with Friends and Family: More than three times a week    Attends Religious Services: 1 to 4 times per year    Active Member of [employer] or Organizations: No    Attends Engineer, structural: Never    Marital Status: Married    Tobacco Counseling Counseling given: Not Answered    Clinical Intake:  Pre-visit preparation completed: Yes  Pain : No/denies pain     BMI - recorded: 32.01 Nutritional Status: BMI > 30  Obese Nutritional Risks: None Diabetes: Yes CBG done?: No (FBS 93 per patient) Did pt. bring in CBG monitor from home?: No  Lab Results  Component Value Date   HGBA1C 6.4 (H) 11/30/2023   HGBA1C 6.2 (H) 08/31/2023   HGBA1C 6.0 (H) 06/09/2023     How often do you need to have someone help you when you read instructions, pamphlets, or other written materials from your doctor or pharmacy?: 1 - Never  Interpreter Needed?: No  Information entered by :: Tora Kindred, CMA   Activities of Daily Living     12/29/2023    4:15 PM  In your present state of health, do you have any difficulty performing the following activities:  Hearing? 1  Comment "sometimes a little bit" needs hearing aids, but can't afford. Bought OTC hearing aids, but doesn't wear them  Vision? 0  Difficulty concentrating or making decisions? 0  Walking or climbing stairs? 0  Dressing  or bathing? 0  Doing errands, shopping? 1  Comment husband Insurance claims handler and eating ? N  Using the  Toilet? N  In the past six months, have you accidently leaked urine? N  Do you have problems with loss of bowel control? N  Managing your Medications? N  Managing your Finances? N  Housekeeping or managing your Housekeeping? N    Patient Care Team: Marjie Skiff, NP as PCP - General (Nurse Practitioner) Debbrah Alar, MD (Dermatology) Sallee Lange, MD (Ophthalmology) Wyline Mood, MD as Consulting Physician (Gastroenterology) Antonieta Iba, MD as Consulting Physician (Cardiology)  Indicate any recent Medical Services you may have received from other than [facility] providers in the past year (date may be approximate).     Assessment:   This is a routine wellness examination for [place].  Hearing/Vision screen Hearing Screening - Comments:: &quot;sometimes a little bit&quot; needs hearing aids, but can't afford. Bought OTC hearing aids, but doesn't wear them Vision Screening - Comments:: Gets DM eye exams, Dr. Dellie Burns, [facility]   Goals Addressed             This Visit's Progress    Weight (lb) < 165 lb (74.8 kg)   175 lb (79.4 kg)      Depression Screen     12/29/2023    4:34 PM 11/30/2023    8:37 AM 08/31/2023   11:18 AM 06/09/2023    8:34 AM 05/12/2023    8:22 AM 02/24/2023    9:58 AM 12/27/2022   10:34 AM  PHQ 2/9 Scores  PHQ - 2 Score 0 0 0 0 0 0 0  PHQ- 9 Score 1 0 0 0 0 4 0    Fall Risk     12/29/2023    4:39 PM 11/30/2023    8:37 AM 08/31/2023   11:18 AM 06/09/2023    8:34 AM 05/12/2023    8:22 AM  Fall Risk   Falls in the past year? 0 0 0 0 0  Number falls in past yr: 0 0 0 0 0  Injury with Fall? 0 0 0 0 0  Risk for fall due to : No Fall Risks No Fall Risks No Fall Risks No Fall Risks No Fall Risks  Follow up Falls prevention discussed;Falls evaluation completed Falls evaluation completed Falls evaluation completed Falls  evaluation completed Falls evaluation completed    MEDICARE RISK AT HOME:  Medicare Risk at Home Any stairs in or around the home?: Yes If so, are there any without handrails?: No Home free of loose throw rugs in walkways, pet beds, electrical cords, etc?: Yes Adequate lighting in your home to reduce risk of falls?: Yes Life alert?: No Use of a cane, walker or w/c?: No Grab bars in the bathroom?: No Shower chair or bench in shower?: No Elevated toilet seat or a handicapped toilet?: No  TIMED UP AND GO:  Was the test performed?  No  Cognitive Function: 6CIT completed        12/29/2023    4:40 PM 12/27/2022   10:42 AM 11/24/2022    9:42 AM 11/14/2020    1:56 PM 10/31/2019   11:12 AM  6CIT Screen  What Year? 0 points 0 points 0 points 0 points 0 points  What month? 0 points 0 points 0 points 0 points 0 points  What time? 0 points 0 points 0 points 0 points 0 points  Count back from 20 0 points 0 points 0 points 0 points 0 points  Months in reverse 0 points 0 points 0 points 0 points 0 points  Repeat phrase 0 points 0  points 0 points 0 points 0 points  Total Score 0 points 0 points 0 points 0 points 0 points    Immunizations Immunization History  Administered Date(s) Administered   Fluad Quad(high Dose 65+) 05/29/2019, 08/08/2020, 08/25/2021, 06/23/2022   Fluad Trivalent(High Dose 65+) 06/09/2023   Influenza, High Dose Seasonal PF 07/15/2017, 07/27/2018   Influenza-Unspecified 06/21/2016   Pneumococcal Conjugate-13 07/15/2017   Pneumococcal Polysaccharide-23 05/22/2014, 05/29/2019   Td 01/29/2020   Tdap 03/04/2008   Zoster Recombinant(Shingrix) 01/31/2018, 05/02/2018, 05/04/2019   Zoster, Live 02/26/2012    Screening Tests Health Maintenance  Topic Date Due   INFLUENZA VACCINE  04/20/2024   DEXA SCAN  05/22/2024   HEMOGLOBIN A1C  06/01/2024   OPHTHALMOLOGY EXAM  08/02/2024   MAMMOGRAM  08/29/2024   Diabetic kidney evaluation - eGFR measurement  11/29/2024    Diabetic kidney evaluation - Urine ACR  11/29/2024   FOOT EXAM  11/29/2024   Medicare Annual Wellness (AWV)  12/28/2024   DTaP/Tdap/Td (3 - Td or Tdap) 01/28/2030   Colonoscopy  12/08/2032   Pneumonia Vaccine 49+ Years old  Completed   Hepatitis C Screening  Completed   Zoster Vaccines- Shingrix  Completed   HPV VACCINES  Aged Out   Meningococcal B Vaccine  Aged Out   COVID-19 Vaccine  Discontinued    Health Maintenance  There are no preventive care reminders to display for this patient.  Health Maintenance Items Addressed: Mammogram ordered, DEXA ordered, See Nurse Notes  Additional Screening:  Vision Screening: Recommended annual ophthalmology exams for early detection of glaucoma and other disorders of the eye.  Dental Screening: Recommended annual dental exams for proper oral hygiene  Community Resource Referral / Chronic Care Management: CRR required this visit?  No   CCM required this visit?  No     Plan:     I have personally reviewed and noted the following in the patient's chart:   Medical and social history Use of alcohol, tobacco or illicit drugs  Current medications and supplements including opioid prescriptions. Patient is not currently taking opioid prescriptions. Functional ability and status Nutritional status Physical activity Advanced directives List of other physicians Hospitalizations, surgeries, and ER visits in previous 12 months Vitals Screenings to include cognitive, depression, and falls Referrals and appointments  In addition, I have reviewed and discussed with patient certain preventive protocols, quality metrics, and best practice recommendations. A written personalized care plan for preventive services as well as general preventive health recommendations were provided to patient.     Tora Kindred, CMA   12/29/2023   After Visit Summary: (MyChart) Due to this being a telephonic visit, the after visit summary with patients  personalized plan was offered to patient via MyChart   Notes:  FBS 93 this morning per patient Placed order for MMG (due ~ 08/29/24) and DEXA scan. Declined DM & Nutrition education

## 2024-03-03 NOTE — Patient Instructions (Signed)
Be Involved in Caring For Your Health:  Taking Medications When medications are taken as directed, they can greatly improve your health. But if they are not taken as prescribed, they may not work. In some cases, not taking them correctly can be harmful. To help ensure your treatment remains effective and safe, understand your medications and how to take them. Bring your medications to each visit for review by your provider.  Your lab results, notes, and after visit summary will be available on My Chart. We strongly encourage you to use this feature. If lab results are abnormal the clinic will contact you with the appropriate steps. If the clinic does not contact you assume the results are satisfactory. You can always view your results on My Chart. If you have questions regarding your health or results, please contact the clinic during office hours. You can also ask questions on My Chart.  We at [facility] are grateful that you chose [place] to provide your care. We strive to provide evidence-based and compassionate care and are always looking for feedback. If you get a survey from the clinic please complete this so we can hear your opinions.  Diabetes Mellitus and Exercise Regular exercise is important for your health, especially if you have diabetes mellitus. Exercise is not just about losing weight. It can also help you increase muscle strength and bone density and reduce body fat and stress. This can help your level of endurance and make you more fit and flexible. Why should I exercise if I have diabetes? Exercise has many benefits for people with diabetes. It can: Help lower and control your blood sugar (glucose). Help your body respond better and become more sensitive to the hormone insulin. Reduce how much insulin your body needs. Lower your risk for heart disease by: Lowering how much "bad" cholesterol and triglycerides you have in your body. Increasing how much "good" cholesterol  you have in your body. Lowering your blood pressure. Lowering your blood glucose levels. What is my activity plan? Your health care provider or an expert trained in diabetes care (certified diabetes educator) can help you make an activity plan. This plan can help you find the type of exercise that works for you. It may also tell you how often to exercise and for how long. Be sure to: Get at least 150 minutes of medium-intensity or high-intensity exercise each week. This may involve brisk walking, biking, or water aerobics. Do stretching and strengthening exercises at least 2 times a week. This may involve yoga or weight lifting. Spread out your activity over at least 3 days of the week. Get some form of physical activity each day. Do not go more than 2 days in a row without some kind of activity. Avoid being inactive for more than 30 minutes at a time. Take frequent breaks to walk or stretch. Choose activities that you enjoy. Set goals that you know you can accomplish. Start slowly and increase the intensity of your exercise over time. How do I manage my diabetes during exercise?  Monitor your blood glucose Check your blood glucose before and after you exercise. If your blood glucose is 240 mg/dL (40.9 mmol/L) or higher before you exercise, check your urine for ketones. These are chemicals created by the liver. If you have ketones in your urine, do not exercise until your blood glucose returns to normal. If your blood glucose is 100 mg/dL (5.6 mmol/L) or lower, eat a snack that has 15-20 grams of carbohydrate in  it. Check your blood glucose 15 minutes after the snack to make sure that your level is above 100 mg/dL (5.6 mmol/L) before you start to exercise. Your risk for low blood glucose (hypoglycemia) goes up during and after exercise. Know the symptoms of this condition and how to treat it. Follow these instructions at home: Keep a carbohydrate snack on hand for use before, during, and after  exercise. This can help prevent or treat hypoglycemia. Avoid injecting insulin into parts of your body that are going to be used during exercise. This may include: Your arms, when you are going to play tennis. Your legs, when you are about to go jogging. Keep track of your exercise habits. This can help you and your health care provider watch and adjust your activity plan. Write down: What you eat before and after you exercise. Blood glucose levels before and after you exercise. The type and amount of exercise you do. Talk to your health care provider before you start a new activity. They may need to: Make sure that the activity is safe for you. Adjust your insulin, other medicines, and food that you eat. Drink water while you exercise. This can stop you from losing too much water (dehydration). It can also prevent problems caused by having a lot of heat in your body (heat stroke). Where to find more information American Diabetes Association: diabetes.org Association of Diabetes Care & Education Specialists: diabeteseducator.org This information is not intended to replace advice given to you by your health care provider. Make sure you discuss any questions you have with your health care provider. Document Revised: 02/24/2022 Document Reviewed: 02/24/2022 Elsevier Patient Education  2024 [employer].

## 2024-03-04 ENCOUNTER — Other Ambulatory Visit: Payer: Self-pay | Admitting: Nurse Practitioner

## 2024-03-06 NOTE — Telephone Encounter (Signed)
 Requested medications are due for refill today.  unsure  Requested medications are on the active medications list.  no  Last refill. never  Future visit scheduled.   yes  Notes to clinic.  Please review.    Requested Prescriptions  Pending Prescriptions Disp Refills   glipiZIDE  (GLUCOTROL ) 5 MG tablet [Pharmacy Med Name: GLIPIZIDE  5 MG TABLET] 90 tablet 4    Sig: TAKE 1 TABLET BY MOUTH DAILY BEFORE BREAKFAST.     Endocrinology:  Diabetes - Sulfonylureas Passed - 03/06/2024  4:30 PM      Passed - HBA1C is between 0 and 7.9 and within 180 days    HB A1C (BAYER DCA - WAIVED)  Date Value Ref Range Status  11/30/2023 6.4 (H) 4.8 - 5.6 % Final    Comment:             Prediabetes: 5.7 - 6.4          Diabetes: >6.4          Glycemic control for adults with diabetes: <7.0          Passed - Cr in normal range and within 360 days    Creatinine, Ser  Date Value Ref Range Status  11/30/2023 0.78 0.57 - 1.00 mg/dL Final         Passed - Valid encounter within last 6 months    Recent Outpatient Visits           3 months ago Insulin  dependent type 2 diabetes mellitus (HCC)   [place] [facility] [place], Pierce T, NP              Signed Prescriptions Disp Refills   metFORMIN  (GLUCOPHAGE ) 1000 MG tablet 180 tablet 1    Sig: TAKE 1 TABLET (1,000 MG TOTAL) BY MOUTH TWICE A DAY WITH FOOD     Endocrinology:  Diabetes - Biguanides Passed - 03/06/2024  4:30 PM      Passed - Cr in normal range and within 360 days    Creatinine, Ser  Date Value Ref Range Status  11/30/2023 0.78 0.57 - 1.00 mg/dL Final         Passed - HBA1C is between 0 and 7.9 and within 180 days    HB A1C (BAYER DCA - WAIVED)  Date Value Ref Range Status  11/30/2023 6.4 (H) 4.8 - 5.6 % Final    Comment:             Prediabetes: 5.7 - 6.4          Diabetes: >6.4          Glycemic control for adults with diabetes: <7.0          Passed - eGFR in normal range and within 360 days    GFR calc  Af Amer  Date Value Ref Range Status  05/08/2020 95 >59 mL/min/1.73 Final    Comment:    **[facility] currently reports eGFR in compliance with the current**   recommendations of the [employer]. [facility] will   update reporting as new guidelines are published from the NKF-ASN   Task force.    GFR, Estimated  Date Value Ref Range Status  09/27/2023 >60 >60 mL/min Final    Comment:    (NOTE) Calculated using the CKD-EPI Creatinine Equation (2021)    eGFR  Date Value Ref Range Status  11/30/2023 81 >59 mL/min/1.73 Final         Passed - B12 Level in normal range and  within 720 days    Vitamin B-12  Date Value Ref Range Status  11/30/2023 583 232 - 1,245 pg/mL Final         Passed - Valid encounter within last 6 months    Recent Outpatient Visits           3 months ago Insulin  dependent type 2 diabetes mellitus (HCC)   [place] [facility] [place], [place] T, NP              Passed - CBC within normal limits and completed in the last 12 months    WBC  Date Value Ref Range Status  11/30/2023 7.8 3.4 - 10.8 x10E3/uL Final  09/27/2023 10.6 (H) 4.0 - 10.5 K/uL Final   RBC  Date Value Ref Range Status  11/30/2023 5.23 3.77 - 5.28 x10E6/uL Final  09/27/2023 5.35 (H) 3.87 - 5.11 MIL/uL Final   Hemoglobin  Date Value Ref Range Status  11/30/2023 14.9 11.1 - 15.9 g/dL Final   Hematocrit  Date Value Ref Range Status  11/30/2023 45.4 34.0 - 46.6 % Final   MCHC  Date Value Ref Range Status  11/30/2023 32.8 31.5 - 35.7 g/dL Final  09/81/1914 78.2 30.0 - 36.0 g/dL Final   [place] [facility]  Date Value Ref Range Status  11/30/2023 28.5 26.6 - 33.0 pg Final  09/27/2023 28.4 26.0 - 34.0 pg Final   MCV  Date Value Ref Range Status  11/30/2023 87 79 - 97 fL Final   No results found for: PLTCOUNTKUC, LABPLAT, POCPLA RDW  Date Value Ref Range Status  11/30/2023 14.1 11.7 - 15.4 % Final          JARDIANCE  25 MG TABS tablet 90 tablet 0     Sig: TAKE 1 TABLET (25 MG TOTAL) BY MOUTH DAILY.     Endocrinology:  Diabetes - SGLT2 Inhibitors Passed - 03/06/2024  4:30 PM      Passed - Cr in normal range and within 360 days    Creatinine, Ser  Date Value Ref Range Status  11/30/2023 0.78 0.57 - 1.00 mg/dL Final         Passed - HBA1C is between 0 and 7.9 and within 180 days    HB A1C (BAYER DCA - WAIVED)  Date Value Ref Range Status  11/30/2023 6.4 (H) 4.8 - 5.6 % Final    Comment:             Prediabetes: 5.7 - 6.4          Diabetes: >6.4          Glycemic control for adults with diabetes: <7.0          Passed - eGFR in normal range and within 360 days    GFR calc Af Amer  Date Value Ref Range Status  05/08/2020 95 >59 mL/min/1.73 Final    Comment:    **[facility] currently reports eGFR in compliance with the current**   recommendations of the [employer]. [facility] will   update reporting as new guidelines are published from the NKF-ASN   Task force.    GFR, Estimated  Date Value Ref Range Status  09/27/2023 >60 >60 mL/min Final    Comment:    (NOTE) Calculated using the CKD-EPI Creatinine Equation (2021)    eGFR  Date Value Ref Range Status  11/30/2023 81 >59 mL/min/1.73 Final         Passed - Valid encounter within last 6 months    Recent Outpatient  Visits           3 months ago Insulin  dependent type 2 diabetes mellitus (HCC)   [place] [facility] [place], Jolene T, NP               hydrochlorothiazide  (HYDRODIURIL ) 25 MG tablet 90 tablet 0    Sig: TAKE 1 TABLET (25 MG TOTAL) BY MOUTH DAILY.     Cardiovascular: Diuretics - Thiazide Passed - 03/06/2024  4:30 PM      Passed - Cr in normal range and within 180 days    Creatinine, Ser  Date Value Ref Range Status  11/30/2023 0.78 0.57 - 1.00 mg/dL Final         Passed - K in normal range and within 180 days    Potassium  Date Value Ref Range Status  11/30/2023 3.8 3.5 - 5.2 mmol/L Final         Passed - Na in  normal range and within 180 days    Sodium  Date Value Ref Range Status  11/30/2023 135 134 - 144 mmol/L Final         Passed - Last BP in normal range    BP Readings from Last 1 Encounters:  11/30/23 131/65         Passed - Valid encounter within last 6 months    Recent Outpatient Visits           3 months ago Insulin  dependent type 2 diabetes mellitus (HCC)   [place] [facility] [place], [place] T, NP              Refused Prescriptions Disp Refills   Insulin  Glargine (BASAGLAR KWIKPEN) 100 UNIT/ML [Pharmacy Med Name: BASAGLAR 100 UNIT/ML KWIKPEN]  1    Sig: INJECT 50 UNITS INTO THE SKIN AT BEDTIME.     Endocrinology:  Diabetes - Insulins Passed - 03/06/2024  4:30 PM      Passed - HBA1C is between 0 and 7.9 and within 180 days    HB A1C (BAYER DCA - WAIVED)  Date Value Ref Range Status  11/30/2023 6.4 (H) 4.8 - 5.6 % Final    Comment:             Prediabetes: 5.7 - 6.4          Diabetes: >6.4          Glycemic control for adults with diabetes: <7.0          Passed - Valid encounter within last 6 months    Recent Outpatient Visits           3 months ago Insulin  dependent type 2 diabetes mellitus (HCC)   [facility] [place], Lavelle Posey, NP

## 2024-03-06 NOTE — Telephone Encounter (Signed)
 Requested Prescriptions  Pending Prescriptions Disp Refills   metFORMIN  (GLUCOPHAGE ) 1000 MG tablet [Pharmacy Med Name: METFORMIN  HCL 1,000 MG TABLET] 180 tablet 1    Sig: TAKE 1 TABLET (1,000 MG TOTAL) BY MOUTH TWICE A DAY WITH FOOD     Endocrinology:  Diabetes - Biguanides Passed - 03/06/2024  4:30 PM      Passed - Cr in normal range and within 360 days    Creatinine, Ser  Date Value Ref Range Status  11/30/2023 0.78 0.57 - 1.00 mg/dL Final         Passed - HBA1C is between 0 and 7.9 and within 180 days    HB A1C (BAYER DCA - WAIVED)  Date Value Ref Range Status  11/30/2023 6.4 (H) 4.8 - 5.6 % Final    Comment:             Prediabetes: 5.7 - 6.4          Diabetes: >6.4          Glycemic control for adults with diabetes: <7.0          Passed - eGFR in normal range and within 360 days    GFR calc Af Amer  Date Value Ref Range Status  05/08/2020 95 >59 mL/min/1.73 Final    Comment:    **[facility] currently reports eGFR in compliance with the current**   recommendations of the [employer]. [facility] will   update reporting as new guidelines are published from the NKF-ASN   Task force.    GFR, Estimated  Date Value Ref Range Status  09/27/2023 >60 >60 mL/min Final    Comment:    (NOTE) Calculated using the CKD-EPI Creatinine Equation (2021)    eGFR  Date Value Ref Range Status  11/30/2023 81 >59 mL/min/1.73 Final         Passed - B12 Level in normal range and within 720 days    Vitamin B-12  Date Value Ref Range Status  11/30/2023 583 232 - 1,245 pg/mL Final         Passed - Valid encounter within last 6 months    Recent Outpatient Visits           3 months ago Insulin  dependent type 2 diabetes mellitus (HCC)   [place] [facility] [place], [place] T, NP              Passed - CBC within normal limits and completed in the last 12 months    WBC  Date Value Ref Range Status  11/30/2023 7.8 3.4 - 10.8 x10E3/uL Final  09/27/2023  10.6 (H) 4.0 - 10.5 K/uL Final   RBC  Date Value Ref Range Status  11/30/2023 5.23 3.77 - 5.28 x10E6/uL Final  09/27/2023 5.35 (H) 3.87 - 5.11 MIL/uL Final   Hemoglobin  Date Value Ref Range Status  11/30/2023 14.9 11.1 - 15.9 g/dL Final   Hematocrit  Date Value Ref Range Status  11/30/2023 45.4 34.0 - 46.6 % Final   MCHC  Date Value Ref Range Status  11/30/2023 32.8 31.5 - 35.7 g/dL Final  16/06/9603 54.0 30.0 - 36.0 g/dL Final   [facility]  Date Value Ref Range Status  11/30/2023 28.5 26.6 - 33.0 pg Final  09/27/2023 28.4 26.0 - 34.0 pg Final   MCV  Date Value Ref Range Status  11/30/2023 87 79 - 97 fL Final   No results found for: PLTCOUNTKUC, LABPLAT, POCPLA RDW  Date Value Ref Range Status  11/30/2023  14.1 11.7 - 15.4 % Final          JARDIANCE  25 MG TABS tablet [Pharmacy Med Name: JARDIANCE  25 MG TABLET] 90 tablet 0    Sig: TAKE 1 TABLET (25 MG TOTAL) BY MOUTH DAILY.     Endocrinology:  Diabetes - SGLT2 Inhibitors Passed - 03/06/2024  4:30 PM      Passed - Cr in normal range and within 360 days    Creatinine, Ser  Date Value Ref Range Status  11/30/2023 0.78 0.57 - 1.00 mg/dL Final         Passed - HBA1C is between 0 and 7.9 and within 180 days    HB A1C (BAYER DCA - WAIVED)  Date Value Ref Range Status  11/30/2023 6.4 (H) 4.8 - 5.6 % Final    Comment:             Prediabetes: 5.7 - 6.4          Diabetes: >6.4          Glycemic control for adults with diabetes: <7.0          Passed - eGFR in normal range and within 360 days    GFR calc Af Amer  Date Value Ref Range Status  05/08/2020 95 >59 mL/min/1.73 Final    Comment:    **[facility] currently reports eGFR in compliance with the current**   recommendations of the [employer]. [facility] will   update reporting as new guidelines are published from the [employer]F-ASN   Task force.    GFR, Estimated  Date Value Ref Range Status  09/27/2023 >60 >60 mL/min Final    Comment:     (NOTE) Calculated using the CKD-EPI Creatinine Equation (2021)    eGFR  Date Value Ref Range Status  11/30/2023 81 >59 mL/min/1.73 Final         Passed - Valid encounter within last 6 months    Recent Outpatient Visits           3 months ago Insulin  dependent type 2 diabetes mellitus (HCC)   [place] [facility] [place], Jolene T, NP               glipiZIDE  (GLUCOTROL ) 5 MG tablet [Pharmacy Med Name: GLIPIZIDE  5 MG TABLET] 90 tablet 4    Sig: TAKE 1 TABLET BY MOUTH DAILY BEFORE BREAKFAST.     Endocrinology:  Diabetes - Sulfonylureas Passed - 03/06/2024  4:30 PM      Passed - HBA1C is between 0 and 7.9 and within 180 days    HB A1C (BAYER DCA - WAIVED)  Date Value Ref Range Status  11/30/2023 6.4 (H) 4.8 - 5.6 % Final    Comment:             Prediabetes: 5.7 - 6.4          Diabetes: >6.4          Glycemic control for adults with diabetes: <7.0          Passed - Cr in normal range and within 360 days    Creatinine, Ser  Date Value Ref Range Status  11/30/2023 0.78 0.57 - 1.00 mg/dL Final         Passed - Valid encounter within last 6 months    Recent Outpatient Visits           3 months ago Insulin  dependent type 2 diabetes mellitus [facility])   [place] [facility] [place], Vincent T,  NP               hydrochlorothiazide  (HYDRODIURIL ) 25 MG tablet [Pharmacy Med Name: HYDROCHLOROTHIAZIDE  25 MG TAB] 90 tablet 0    Sig: TAKE 1 TABLET (25 MG TOTAL) BY MOUTH DAILY.     Cardiovascular: Diuretics - Thiazide Passed - 03/06/2024  4:30 PM      Passed - Cr in normal range and within 180 days    Creatinine, Ser  Date Value Ref Range Status  11/30/2023 0.78 0.57 - 1.00 mg/dL Final         Passed - K in normal range and within 180 days    Potassium  Date Value Ref Range Status  11/30/2023 3.8 3.5 - 5.2 mmol/L Final         Passed - Na in normal range and within 180 days    Sodium  Date Value Ref Range Status  11/30/2023 135 134 -  144 mmol/L Final         Passed - Last BP in normal range    BP Readings from Last 1 Encounters:  11/30/23 131/65         Passed - Valid encounter within last 6 months    Recent Outpatient Visits           3 months ago Insulin  dependent type 2 diabetes mellitus (HCC)   [place] [facility] [place], Dolores T, NP              Refused Prescriptions Disp Refills   Insulin  Glargine (BASAGLAR KWIKPEN) 100 UNIT/ML [Pharmacy Med Name: BASAGLAR 100 UNIT/ML KWIKPEN]  1    Sig: INJECT 50 UNITS INTO THE SKIN AT BEDTIME.     Endocrinology:  Diabetes - Insulins Passed - 03/06/2024  4:30 PM      Passed - HBA1C is between 0 and 7.9 and within 180 days    HB A1C (BAYER DCA - WAIVED)  Date Value Ref Range Status  11/30/2023 6.4 (H) 4.8 - 5.6 % Final    Comment:             Prediabetes: 5.7 - 6.4          Diabetes: >6.4          Glycemic control for adults with diabetes: <7.0          Passed - Valid encounter within last 6 months    Recent Outpatient Visits           3 months ago Insulin  dependent type 2 diabetes mellitus (HCC)   [place] [facility] [place], Lavelle Posey, NP

## 2024-03-07 ENCOUNTER — Ambulatory Visit (INDEPENDENT_AMBULATORY_CARE_PROVIDER_SITE_OTHER): Admitting: Nurse Practitioner

## 2024-03-07 VITALS — BP 119/64 | HR 67 | Temp 97.6°F | Ht 62.0 in | Wt 174.0 lb

## 2024-03-07 DIAGNOSIS — E6609 Other obesity due to excess calories: Secondary | ICD-10-CM | POA: Diagnosis not present

## 2024-03-07 DIAGNOSIS — R002 Palpitations: Secondary | ICD-10-CM | POA: Diagnosis not present

## 2024-03-07 DIAGNOSIS — I152 Hypertension secondary to endocrine disorders: Secondary | ICD-10-CM | POA: Diagnosis not present

## 2024-03-07 DIAGNOSIS — R011 Cardiac murmur, unspecified: Secondary | ICD-10-CM

## 2024-03-07 DIAGNOSIS — I479 Paroxysmal tachycardia, unspecified: Secondary | ICD-10-CM

## 2024-03-07 DIAGNOSIS — E119 Type 2 diabetes mellitus without complications: Secondary | ICD-10-CM

## 2024-03-07 DIAGNOSIS — G4733 Obstructive sleep apnea (adult) (pediatric): Secondary | ICD-10-CM | POA: Diagnosis not present

## 2024-03-07 DIAGNOSIS — E785 Hyperlipidemia, unspecified: Secondary | ICD-10-CM

## 2024-03-07 DIAGNOSIS — E1159 Type 2 diabetes mellitus with other circulatory complications: Secondary | ICD-10-CM

## 2024-03-07 DIAGNOSIS — E1169 Type 2 diabetes mellitus with other specified complication: Secondary | ICD-10-CM

## 2024-03-07 DIAGNOSIS — E66811 Obesity, class 1: Secondary | ICD-10-CM | POA: Diagnosis not present

## 2024-03-07 DIAGNOSIS — Z794 Long term (current) use of insulin: Secondary | ICD-10-CM | POA: Diagnosis not present

## 2024-03-07 LAB — BAYER DCA HB A1C WAIVED: HB A1C (BAYER DCA - WAIVED): 6.3 % — ABNORMAL HIGH (ref 4.8–5.6)

## 2024-03-07 MED ORDER — EMPAGLIFLOZIN 25 MG PO TABS: 25.0000 mg | ORAL_TABLET | Freq: Every day | ORAL | 3 refills | Status: AC

## 2024-03-07 NOTE — Assessment & Plan Note (Signed)
Chronic, does not consistently wear CPAP.  Recommend 100% use. 

## 2024-03-07 NOTE — Assessment & Plan Note (Signed)
Chronic, ongoing.  Will continue Rosuvastatin daily and adjust dose as needed.  Lipid panel today.

## 2024-03-07 NOTE — Assessment & Plan Note (Signed)
 Chronic, stable at present with current medication regimen.  Continue this regimen and collaboration with cardiology.

## 2024-03-07 NOTE — Assessment & Plan Note (Signed)
 Chronic, ongoing.  Urine ALB 18 December 2023.  A1c 6.3% today, remaining stable.  Pancreatitis presented with Mounjaro  & Ozempic .  Had tolerated Ozempic  well for years.  Uses Dexcom. - At this time will maintain off GLP1 or Mounjaro  due to pancreatitis in past. - Continue Jardiance  and Metformin . Continue Basaglar 46 units, reduce this if morning BS consistently less then 130 or lows <70 present - discussed at length with her.  Continue Novolog  pre-meals 5-12 units as needed only (not to take consistently), educated her on this and not to take if does not eat a meal or take after meals. She has Glipizide  at home but rarely takes it either and is aware not to take daily. - Dexcom continues.  Continue to work with [facility] on assistance programs. - Check blood sugar 4-5 times a day and document for provider.   - Foot and eye exams up to date - Statin and ARB on board. - Flu shot and Pneumonia vaccines up to date. Return in 3 months.

## 2024-03-07 NOTE — Assessment & Plan Note (Signed)
 Noted on exam.  Asymptomatic at this time.  Reassuring work-up with cardiology in past.

## 2024-03-07 NOTE — Assessment & Plan Note (Signed)
 Suspect some underlying SVT.  Continue collaboration with cardiology as needed + medications as ordered by them.

## 2024-03-07 NOTE — Assessment & Plan Note (Signed)
BMI 31.83.  Recommended eating smaller high protein, low fat meals more frequently and exercising 30 mins a day 5 times a week with a goal of 10-15lb weight loss in the next 3 months. Patient voiced their understanding and motivation to adhere to these recommendations.  

## 2024-03-07 NOTE — Progress Notes (Signed)
 BP 119/64   Pulse 67   Temp 97.6 F (36.4 C) (Oral)   Ht 5' 2 (1.575 m)   Wt 174 lb (78.9 kg)   LMP 07/13/2000 (Approximate)   SpO2 97%   BMI 31.83 kg/m    Subjective:    Patient ID: Charlene Juarez, female    DOB: [birth year], 73 y.o.   MRN: [id number]  HPI: Charlene Juarez is a 73 y.o. female  Chief Complaint  Patient presents with   Diabetes   Hyperlipidemia   Hypertension   DIABETES A1c March 6.4%. Continues Basaglar 46 units (currently is using up her [place]), Jardiance  25 MG, Metformin  1000 MG BID, Novolog  as needed before meals (very rarely takes) and takes a rare Glipizide . Taking Gabapentin  100 MG BID for neuropathy and B12 supplement. Dexcom = average over past 90 days has been 143, in range 75% of the time, 21% high, and 4% low.    History: Cannot take GLP1 or Mounjaro  due to pancreatitis with these. Fatty liver noted on past imaging.  Hypoglycemic episodes:no Polydipsia/polyuria: no Visual disturbance: no Chest pain: no Paresthesias: no Glucose Monitoring: no             Accucheck frequency: daily              Fasting glucose:              Post prandial:             Evening:              Before meals: Taking Insulin ?: yes             Long acting insulin : 46 units             Short acting insulin : rarely Blood Pressure Monitoring: yes Retinal Examination: Up To Date -- [facility] Foot Exam: Up to Date Pneumovax: Up to Date Influenza: Up to Date Aspirin: no   HYPERTENSION / HYPERLIPIDEMIA Taking Telmisartan , HCTZ, Metoprolol  and Crestor .  Last visit with cardiology on 08/01/23 due to palpitations, overall reassuring work-up.  Does take Diltiazem  as needed for palpitations.  Does not consistently use CPAP at home Satisfied with current treatment? yes Duration of hypertension: chronic BP monitoring frequency: not often BP range:  BP medication side effects: no Duration of hyperlipidemia: chronic Cholesterol medication side  effects: no Cholesterol supplements: fish oil Medication compliance: good compliance Aspirin: no Recent stressors: no Recurrent headaches: no Visual changes: no Palpitations: yes, a couple light episodes after forgetting to take Diltiazem  Dyspnea: no Chest pain: no Lower extremity edema: no Dizzy/lightheaded: yes if gets up too quickly The 10-year ASCVD risk score (Arnett DK, et al., 2019) is: 27.2%   Values used to calculate the score:     Age: 64 years     Clincally relevant sex: Female     Is Non-Hispanic African American: No     Diabetic: Yes     Tobacco smoker: No     Systolic Blood Pressure: 119 mmHg     Is BP treated: Yes     HDL Cholesterol: 33 mg/dL     Total Cholesterol: 268 mg/dL  Relevant past medical, surgical, family and social history reviewed and updated as indicated. Interim medical history since our last visit reviewed. Allergies and medications reviewed and updated.  Review of Systems  Constitutional:  Negative for activity change, appetite change, diaphoresis, fatigue and fever.  HENT:  Negative for ear discharge and ear pain.   Respiratory:  Negative  for cough, chest tightness, shortness of breath and wheezing.   Cardiovascular:  Negative for chest pain, palpitations and leg swelling.  Endocrine: Negative for polydipsia, polyphagia and polyuria.  Neurological: Negative.   Psychiatric/Behavioral: Negative.     Per HPI unless specifically indicated above     Objective:    BP 119/64   Pulse 67   Temp 97.6 F (36.4 C) (Oral)   Ht 5' 2 (1.575 m)   Wt 174 lb (78.9 kg)   LMP 07/13/2000 (Approximate)   SpO2 97%   BMI 31.83 kg/m   Wt Readings from Last 3 Encounters:  03/07/24 174 lb (78.9 kg)  12/29/23 175 lb (79.4 kg)  11/30/23 174 lb 6.4 oz (79.1 kg)    Physical Exam Vitals and nursing note reviewed.  Constitutional:      General: She is awake. She is not in acute distress.    Appearance: Normal appearance. She is well-developed and  well-groomed. She is obese. She is not ill-appearing or toxic-appearing.  HENT:     Head: Normocephalic.     Right Ear: Hearing and external ear normal.     Left Ear: Hearing and external ear normal.   Eyes:     General: Lids are normal.        Right eye: No discharge.        Left eye: No discharge.     Conjunctiva/sclera: Conjunctivae normal.     Pupils: Pupils are equal, round, and reactive to light.   Neck:     Thyroid : No thyromegaly.     Vascular: No carotid bruit.   Cardiovascular:     Rate and Rhythm: Normal rate and regular rhythm.     Heart sounds: Murmur heard.     Systolic murmur is present with a grade of 2/6.     No gallop.  Pulmonary:     Effort: Pulmonary effort is normal. No accessory muscle usage or respiratory distress.     Breath sounds: Normal breath sounds.  Abdominal:     General: Bowel sounds are normal. There is no distension.     Palpations: Abdomen is soft.     Tenderness: There is no abdominal tenderness.   Musculoskeletal:     Cervical back: Normal range of motion and neck supple.     Right lower leg: No edema.     Left lower leg: No edema.  Lymphadenopathy:     Cervical: No cervical adenopathy.   Skin:    General: Skin is warm and dry.   Neurological:     Mental Status: She is alert and oriented to person, place, and time.     Deep Tendon Reflexes: Reflexes are normal and symmetric.     Reflex Scores:      Brachioradialis reflexes are 2+ on the right side and 2+ on the left side.      Patellar reflexes are 2+ on the right side and 2+ on the left side.  Psychiatric:        Attention and Perception: Attention normal.        Mood and Affect: Mood normal.        Speech: Speech normal.        Behavior: Behavior normal. Behavior is cooperative.        Thought Content: Thought content normal.    Results for orders placed or performed in visit on 11/30/23  Bayer DCA Hb A1c Waived   Collection Time: 11/30/23  8:44 AM  Result Value Ref  Range  HB A1C (BAYER DCA - WAIVED) 6.4 (H) 4.8 - 5.6 %  Microalbumin, Urine Waived   Collection Time: 11/30/23  8:44 AM  Result Value Ref Range   Microalb, Ur Waived 30 (H) 0 - 19 mg/L   Creatinine, Urine Waived 100 10 - 300 mg/dL   Microalb/Creat Ratio 30-300 (H) <30 mg/g  Comprehensive metabolic panel   Collection Time: 11/30/23  8:45 AM  Result Value Ref Range   Glucose 147 (H) 70 - 99 mg/dL   BUN 16 8 - 27 mg/dL   Creatinine, Ser 9.62 0.57 - 1.00 mg/dL   eGFR 81 >95 MW/UXL/2.44   BUN/Creatinine Ratio 21 12 - 28   Sodium 135 134 - 144 mmol/L   Potassium 3.8 3.5 - 5.2 mmol/L   Chloride 94 (L) 96 - 106 mmol/L   CO2 25 20 - 29 mmol/L   Calcium  9.8 8.7 - 10.3 mg/dL   Total Protein 7.0 6.0 - 8.5 g/dL   Albumin 4.5 3.8 - 4.8 g/dL   Globulin, Total 2.5 1.5 - 4.5 g/dL   Bilirubin Total 0.5 0.0 - 1.2 mg/dL   Alkaline Phosphatase 60 44 - 121 IU/L   AST 21 0 - 40 IU/L   ALT 23 0 - 32 IU/L  Lipid Panel w/o Chol/HDL Ratio   Collection Time: 11/30/23  8:45 AM  Result Value Ref Range   Cholesterol, Total 268 (H) 100 - 199 mg/dL   Triglycerides 010 (H) 0 - 149 mg/dL   HDL 33 (L) >27 mg/dL   VLDL Cholesterol Cal 57 (H) 5 - 40 mg/dL   LDL Chol Calc (NIH) 253 (H) 0 - 99 mg/dL  Magnesium   Collection Time: 11/30/23  8:45 AM  Result Value Ref Range   Magnesium 2.0 1.6 - 2.3 mg/dL  VITAMIN D  25 Hydroxy (Vit-D Deficiency, Fractures)   Collection Time: 11/30/23  8:45 AM  Result Value Ref Range   Vit D, 25-Hydroxy 66.0 30.0 - 100.0 ng/mL  Vitamin B12   Collection Time: 11/30/23  8:45 AM  Result Value Ref Range   Vitamin B-12 583 232 - 1,245 pg/mL  CBC with Differential/Platelet   Collection Time: 11/30/23  8:45 AM  Result Value Ref Range   WBC 7.8 3.4 - 10.8 x10E3/uL   RBC 5.23 3.77 - 5.28 x10E6/uL   Hemoglobin 14.9 11.1 - 15.9 g/dL   Hematocrit 66.4 40.3 - 46.6 %   MCV 87 79 - 97 fL   MCH 28.5 26.6 - 33.0 pg   MCHC 32.8 31.5 - 35.7 g/dL   RDW 47.4 25.9 - 56.3 %   Platelets 268  150 - 450 x10E3/uL   Neutrophils 58 Not Estab. %   Lymphs 29 Not Estab. %   Monocytes 10 Not Estab. %   Eos 3 Not Estab. %   Basos 0 Not Estab. %   Neutrophils Absolute 4.5 1.4 - 7.0 x10E3/uL   Lymphocytes Absolute 2.3 0.7 - 3.1 x10E3/uL   Monocytes Absolute 0.8 0.1 - 0.9 x10E3/uL   EOS (ABSOLUTE) 0.2 0.0 - 0.4 x10E3/uL   Basophils Absolute 0.0 0.0 - 0.2 x10E3/uL   Immature Granulocytes 0 Not Estab. %   Immature Grans (Abs) 0.0 0.0 - 0.1 x10E3/uL      Assessment & Plan:   Problem List Items Addressed This Visit       Cardiovascular and Mediastinum   Paroxysmal tachycardia (HCC)   Chronic, stable at present with current medication regimen.  Continue this regimen and collaboration with cardiology.  Hypertension associated with diabetes (HCC)   Chronic, stable.  BP at goal in office and at home.  Continue Telmisartan  for kidney and hydrochlorothiazide  for fluid retention, she is aware this medication could diminish effects of insulin  == may need to consider Amlodipine if BP trends up in future.  LABS: CMP.  Urine ALB 18 December 2023.  Recommend she continue to check BP at home on occasion and document for visits. Focus on DASH diet.  Return in 3 months.      Relevant Medications   empagliflozin  (JARDIANCE ) 25 MG TABS tablet   Other Relevant Orders   Bayer DCA Hb A1c Waived     Respiratory   OSA (obstructive sleep apnea)   Chronic, does not consistently wear CPAP.  Recommend 100% use.        Endocrine   Insulin  dependent type 2 diabetes mellitus (HCC) - Primary   Chronic, ongoing.  Urine ALB 18 December 2023.  A1c 6.3% today, remaining stable.  Pancreatitis presented with Mounjaro  & Ozempic .  Had tolerated Ozempic  well for years.  Uses Dexcom. - At this time will maintain off GLP1 or Mounjaro  due to pancreatitis in past. - Continue Jardiance  and Metformin . Continue Basaglar 46 units, reduce this if morning BS consistently less then 130 or lows <70 present - discussed at length  with her.  Continue Novolog  pre-meals 5-12 units as needed only (not to take consistently), educated her on this and not to take if does not eat a meal or take after meals. She has Glipizide  at home but rarely takes it either and is aware not to take daily. - Dexcom continues.  Continue to work with [facility] on assistance programs. - Check blood sugar 4-5 times a day and document for provider.   - Foot and eye exams up to date - Statin and ARB on board. - Flu shot and Pneumonia vaccines up to date. Return in 3 months.      Relevant Medications   empagliflozin  (JARDIANCE ) 25 MG TABS tablet   Other Relevant Orders   Bayer DCA Hb A1c Waived   Hyperlipidemia associated with type 2 diabetes mellitus (HCC)   Chronic, ongoing.  Will continue Rosuvastatin  daily and adjust dose as needed.  Lipid panel today.        Relevant Medications   empagliflozin  (JARDIANCE ) 25 MG TABS tablet   Other Relevant Orders   Bayer DCA Hb A1c Waived   Comprehensive metabolic panel with GFR   Lipid Panel w/o Chol/HDL Ratio     Other   Systolic murmur   Noted on exam.  Asymptomatic at this time.  Reassuring work-up with cardiology in past.      Obesity   BMI 31.83.  Recommended eating smaller high protein, low fat meals more frequently and exercising 30 mins a day 5 times a week with a goal of 10-15lb weight loss in the next 3 months. Patient voiced their understanding and motivation to adhere to these recommendations.       Relevant Medications   empagliflozin  (JARDIANCE ) 25 MG TABS tablet   Intermittent palpitations   Suspect some underlying SVT.  Continue collaboration with cardiology as needed + medications as ordered by them.        Follow up plan: Return in about 3 months (around 06/07/2024) for T2DM, HTN/HLD, ASTHMA.

## 2024-03-07 NOTE — Assessment & Plan Note (Signed)
 Chronic, stable.  BP at goal in office and at home.  Continue Telmisartan  for kidney and hydrochlorothiazide  for fluid retention, she is aware this medication could diminish effects of insulin  == may need to consider Amlodipine if BP trends up in future.  LABS: CMP.  Urine ALB 18 December 2023.  Recommend she continue to check BP at home on occasion and document for visits. Focus on DASH diet.  Return in 3 months.

## 2024-03-08 ENCOUNTER — Ambulatory Visit: Payer: Self-pay | Admitting: Nurse Practitioner

## 2024-03-08 LAB — COMPREHENSIVE METABOLIC PANEL WITH GFR
ALT: 31 IU/L (ref 0–32)
AST: 33 IU/L (ref 0–40)
Albumin: 4.5 g/dL (ref 3.8–4.8)
Alkaline Phosphatase: 62 IU/L (ref 44–121)
BUN/Creatinine Ratio: 26 (ref 12–28)
BUN: 18 mg/dL (ref 8–27)
Bilirubin Total: 0.3 mg/dL (ref 0.0–1.2)
CO2: 23 mmol/L (ref 20–29)
Calcium: 9.4 mg/dL (ref 8.7–10.3)
Chloride: 99 mmol/L (ref 96–106)
Creatinine, Ser: 0.69 mg/dL (ref 0.57–1.00)
Globulin, Total: 2 g/dL (ref 1.5–4.5)
Glucose: 63 mg/dL — ABNORMAL LOW (ref 70–99)
Potassium: 3.6 mmol/L (ref 3.5–5.2)
Sodium: 139 mmol/L (ref 134–144)
Total Protein: 6.5 g/dL (ref 6.0–8.5)
eGFR: 92 mL/min/{1.73_m2} (ref >59)

## 2024-03-08 LAB — LIPID PANEL W/O CHOL/HDL RATIO
Cholesterol, Total: 119 mg/dL (ref 100–199)
HDL: 66 mg/dL (ref >39)
LDL Chol Calc (NIH): 41 mg/dL (ref 0–99)
Triglycerides: 50 mg/dL (ref 0–149)
VLDL Cholesterol Cal: 12 mg/dL (ref 5–40)

## 2024-03-08 NOTE — Progress Notes (Signed)
 Contacted via MyChart  Good afternoon Dailee, your labs have returned and look great this check. Kidney function, creatinine and eGFR, remains normal, as is liver function, AST and ALT. Lipid panel showing levels nicely trending down.  Any questions? Keep being amazing!!  Thank you for allowing me to participate in your care.  I appreciate you. Kindest regards, Demetrias Goodbar

## 2024-03-12 ENCOUNTER — Other Ambulatory Visit: Payer: Self-pay | Admitting: Nurse Practitioner

## 2024-03-14 NOTE — Telephone Encounter (Signed)
 Requested Prescriptions  Pending Prescriptions Disp Refills   BD PEN NEEDLE SHORT ULTRAFINE 31G X 8 MM MISC [Pharmacy Med Name: BD UF SHORT PEN NEEDLE [id number]] 300 each 0    Sig: USE ONE NEEDLE DAILY TO INJECT INSULIN  4-5 TIMES A DAY.     Endocrinology: Diabetes - Testing Supplies Passed - 03/14/2024  9:33 AM      Passed - Valid encounter within last 12 months    Recent Outpatient Visits           1 week ago Insulin  dependent type 2 diabetes mellitus (HCC)   [facility] [place], Estancia T, NP   3 months ago Insulin  dependent type 2 diabetes mellitus (HCC)    [facility] [place], Melanie DASEN, NP

## 2024-06-01 ENCOUNTER — Other Ambulatory Visit: Payer: Self-pay | Admitting: Nurse Practitioner

## 2024-06-01 NOTE — Telephone Encounter (Signed)
 Requested Prescriptions  Pending Prescriptions Disp Refills   Insulin  Pen Needle (BD PEN NEEDLE SHORT ULTRAFINE) 31G X 8 MM MISC [Pharmacy Med Name: BD UF SHORT PEN NEEDLE [id number]] 400 each 1    Sig: USE ONE NEEDLE DAILY TO INJECT INSULIN  4-5 TIMES A DAY.     Endocrinology: Diabetes - Testing Supplies Passed - 06/01/2024  1:47 PM      Passed - Valid encounter within last 12 months    Recent Outpatient Visits           2 months ago Insulin  dependent type 2 diabetes mellitus (HCC)   [facility] [place], [place] T, NP   6 months ago Insulin  dependent type 2 diabetes mellitus (HCC)   [facility] [place], Melanie DASEN, NP

## 2024-06-03 NOTE — Patient Instructions (Addendum)
 Your mammogram is due December 10th, please schedule bone density with this.  Be Involved in Caring For Your Health:  Taking Medications When medications are taken as directed, they can greatly improve your health. But if they are not taken as prescribed, they may not work. In some cases, not taking them correctly can be harmful. To help ensure your treatment remains effective and safe, understand your medications and how to take them. Bring your medications to each visit for review by your provider.  Your lab results, notes, and after visit summary will be available on My Chart. We strongly encourage you to use this feature. If lab results are abnormal the clinic will contact you with the appropriate steps. If the clinic does not contact you assume the results are satisfactory. You can always view your results on My Chart. If you have questions regarding your health or results, please contact the clinic during office hours. You can also ask questions on My Chart.  We at [facility] are grateful that you chose us  to provide your care. We strive to provide evidence-based and compassionate care and are always looking for feedback. If you get a survey from the clinic please complete this so we can hear your opinions.   Diabetes Mellitus and Exercise Regular exercise is important for your health, especially if you have diabetes mellitus. Exercise is not just about losing weight. It can also help you increase muscle strength and bone density and reduce body fat and stress. This can help your level of endurance and make you more fit and flexible. Why should I exercise if I have diabetes? Exercise has many benefits for people with diabetes. It can: Help lower and control your blood sugar (glucose). Help your body respond better and become more sensitive to the hormone insulin . Reduce how much insulin  your body needs. Lower your risk for heart disease by: Lowering how much bad cholesterol  and triglycerides you have in your body. Increasing how much good cholesterol you have in your body. Lowering your blood pressure. Lowering your blood glucose levels. What is my activity plan? Your health care provider or an expert trained in diabetes care (certified diabetes educator) can help you make an activity plan. This plan can help you find the type of exercise that works for you. It may also tell you how often to exercise and for how long. Be sure to: Get at least 150 minutes of medium-intensity or high-intensity exercise each week. This may involve brisk walking, biking, or water aerobics. Do stretching and strengthening exercises at least 2 times a week. This may involve yoga or weight lifting. Spread out your activity over at least 3 days of the week. Get some form of physical activity each day. Do not go more than 2 days in a row without some kind of activity. Avoid being inactive for more than 30 minutes at a time. Take frequent breaks to walk or stretch. Choose activities that you enjoy. Set goals that you know you can accomplish. Start slowly and increase the intensity of your exercise over time. How do I manage my diabetes during exercise?  Monitor your blood glucose Check your blood glucose before and after you exercise. If your blood glucose is 240 mg/dL (86.6 mmol/L) or higher before you exercise, check your urine for ketones. These are chemicals created by the liver. If you have ketones in your urine, do not exercise until your blood glucose returns to normal. If your blood glucose is 100 mg/dL (  5.6 mmol/L) or lower, eat a snack that has 15-20 grams of carbohydrate in it. Check your blood glucose 15 minutes after the snack to make sure that your level is above 100 mg/dL (5.6 mmol/L) before you start to exercise. Your risk for low blood glucose (hypoglycemia) goes up during and after exercise. Know the symptoms of this condition and how to treat it. Follow these  instructions at home: Keep a carbohydrate snack on hand for use before, during, and after exercise. This can help prevent or treat hypoglycemia. Avoid injecting insulin  into parts of your body that are going to be used during exercise. This may include: Your arms, when you are going to play tennis. Your legs, when you are about to go jogging. Keep track of your exercise habits. This can help you and your health care provider watch and adjust your activity plan. Write down: What you eat before and after you exercise. Blood glucose levels before and after you exercise. The type and amount of exercise you do. Talk to your health care provider before you start a new activity. They may need to: Make sure that the activity is safe for you. Adjust your insulin , other medicines, and food that you eat. Drink water while you exercise. This can stop you from losing too much water (dehydration). It can also prevent problems caused by having a lot of heat in your body (heat stroke). Where to find more information American Diabetes Association: diabetes.org Association of Diabetes Care & Education Specialists: diabeteseducator.org This information is not intended to replace advice given to you by your health care provider. Make sure you discuss any questions you have with your health care provider. Document Revised: 02/24/2022 Document Reviewed: 02/24/2022 Elsevier Patient Education  2024 [employer].

## 2024-06-07 ENCOUNTER — Encounter: Payer: Self-pay | Admitting: Nurse Practitioner

## 2024-06-07 ENCOUNTER — Ambulatory Visit: Admitting: Nurse Practitioner

## 2024-06-07 VITALS — BP 104/58 | HR 63 | Temp 98.1°F | Resp 16 | Ht 62.01 in | Wt 176.0 lb

## 2024-06-07 DIAGNOSIS — M85851 Other specified disorders of bone density and structure, right thigh: Secondary | ICD-10-CM | POA: Diagnosis not present

## 2024-06-07 DIAGNOSIS — G4733 Obstructive sleep apnea (adult) (pediatric): Secondary | ICD-10-CM | POA: Diagnosis not present

## 2024-06-07 DIAGNOSIS — J4520 Mild intermittent asthma, uncomplicated: Secondary | ICD-10-CM

## 2024-06-07 DIAGNOSIS — K760 Fatty (change of) liver, not elsewhere classified: Secondary | ICD-10-CM

## 2024-06-07 DIAGNOSIS — E119 Type 2 diabetes mellitus without complications: Secondary | ICD-10-CM | POA: Diagnosis not present

## 2024-06-07 DIAGNOSIS — I479 Paroxysmal tachycardia, unspecified: Secondary | ICD-10-CM

## 2024-06-07 DIAGNOSIS — Z794 Long term (current) use of insulin: Secondary | ICD-10-CM

## 2024-06-07 DIAGNOSIS — Z23 Encounter for immunization: Secondary | ICD-10-CM | POA: Diagnosis not present

## 2024-06-07 DIAGNOSIS — E1159 Type 2 diabetes mellitus with other circulatory complications: Secondary | ICD-10-CM

## 2024-06-07 DIAGNOSIS — E785 Hyperlipidemia, unspecified: Secondary | ICD-10-CM | POA: Diagnosis not present

## 2024-06-07 DIAGNOSIS — I152 Hypertension secondary to endocrine disorders: Secondary | ICD-10-CM | POA: Diagnosis not present

## 2024-06-07 DIAGNOSIS — E66811 Obesity, class 1: Secondary | ICD-10-CM

## 2024-06-07 DIAGNOSIS — R002 Palpitations: Secondary | ICD-10-CM

## 2024-06-07 DIAGNOSIS — E1169 Type 2 diabetes mellitus with other specified complication: Secondary | ICD-10-CM

## 2024-06-07 LAB — BAYER DCA HB A1C WAIVED: HB A1C (BAYER DCA - WAIVED): 6.5 % — ABNORMAL HIGH (ref 4.8–5.6)

## 2024-06-07 MED ORDER — HYDROCHLOROTHIAZIDE 25 MG PO TABS: 25.0000 mg | ORAL_TABLET | Freq: Every day | ORAL | 4 refills | Status: AC

## 2024-06-07 NOTE — Assessment & Plan Note (Signed)
 Chronic, ongoing.  Urine ALB 18 December 2023.  A1c 6.5% today, remaining stable.  Pancreatitis presented with Mounjaro  & Ozempic .  Had tolerated Ozempic  well for years prior to that.  Uses Dexcom. - At this time will maintain off GLP1 or Mounjaro  due to pancreatitis in past. - Continue Jardiance  and Metformin . Continue Basaglar 40 units, reduce this if morning BS consistently less then 130 or lows <70 present - discussed at length with her.  Continue Novolog  pre-meals but recommend 5-12 units as needed only and not 20 (not to take consistently), educated her on this and not to take if does not eat a meal or take after meals. She has Glipizide  at home but rarely takes it either and is aware not to take daily. - Dexcom continues.  Continue to work with [facility] Pharm[facility] on assistance programs. - Check blood sugar 4-5 times a day and document for provider.   - Foot and eye exams up to date - Statin and ARB on board. - Flu shot and Pneumonia vaccines up to date. Return in 3 months.

## 2024-06-07 NOTE — Assessment & Plan Note (Signed)
Chronic, noted on past imaging. At this time recommend heavy focus on diet and regular activity + working on diabetes control with goal A1c <7%.

## 2024-06-07 NOTE — Assessment & Plan Note (Addendum)
 Chronic, stable. BP well below goal in office today. Continue Telmisartan  for kidney and hydrochlorothiazide  for fluid retention, she is aware this medication could diminish effects of insulin  == may need to consider Amlodipine if BP trends up in future.  LABS: CMP.  Urine ALB 18 December 2023.  Recommend she continue to check BP at home on occasion and document for visits. Focus on DASH diet.  Return in 3 months.

## 2024-06-07 NOTE — Assessment & Plan Note (Signed)
Chronic, does not consistently wear CPAP.  Recommend 100% use. 

## 2024-06-07 NOTE — Progress Notes (Signed)
 BP (!) 104/58 (BP Location: Left Arm, Patient Position: Sitting, Cuff Size: Large)   Pulse 63   Temp 98.1 F (36.7 C) (Oral)   Resp 16   Ht 5' 2.01 (1.575 m)   Wt 176 lb (79.8 kg)   LMP 07/13/2000 (Approximate)   SpO2 97%   BMI 32.18 kg/m    Subjective:    Patient ID: Charlene Juarez, female    DOB: 1950/10/22, 73 y.o.   MRN: [id number]  HPI: Charlene Juarez is a 73 y.o. female  Chief Complaint  Patient presents with   Asthma    No concerns   Diabetes    Wears G7. Ranging 7.2    HTN/HLD    Some home checks.    DIABETES Charlene Juarez A1c 6.3%. Takes Basaglar 40 units, Jardiance  25 MG, Metformin  1000 MG BID, Novolog  as needed before meals (uses once a day -- 20 units) and once in a blue mood takes Glipizide . Takes B12 supplement, has stopped taking Gabapentin . Dexcom recently went out on her, did not bring today. A1c was reading 7.2 to 7.3% on this prior to it coming off.  History: Cannot take GLP1 or Mounjaro  due to pancreatitis with these. Fatty liver noted on past imaging.  Hypoglycemic episodes:no Polydipsia/polyuria: no Visual disturbance: no Chest pain: no Paresthesias: no Glucose Monitoring: no             Accucheck frequency: daily              Fasting glucose:              Post prandial:             Evening:              Before meals: Taking Insulin ?: yes             Long acting insulin : 40 units             Short acting insulin : as above Blood Pressure Monitoring: yes Retinal Examination: Up To Date --  Eye Foot Exam: Up to Date Pneumovax: Up to Date Influenza: Up to Date Aspirin: no   HYPERTENSION / HYPERLIPIDEMIA Continues Telmisartan , HCTZ, Metoprolol  and Crestor . Saw cardiology last on 08/01/23 due to palpitations with a reassuring work-up. Takes Diltiazem  as needed for palpitations (often takes twice a day to control symptoms).  Not consistently using CPAP. Satisfied with current treatment? yes Duration of hypertension:  chronic BP monitoring frequency: not often BP range:  BP medication side effects: no Duration of hyperlipidemia: chronic Cholesterol medication side effects: no Cholesterol supplements: fish oil Medication compliance: good compliance Aspirin: no Recent stressors: no Recurrent headaches: no Visual changes: no Palpitations: yes, on occasion, only a couple times if in heat Dyspnea: no Chest pain: no Lower extremity edema: no Dizzy/lightheaded: yes if gets up too quickly The ASCVD Risk score (Arnett DK, et al., 2019) failed to calculate for the following reasons:   The valid total cholesterol range is 130 to 320 mg/dL  OSTEOPENIA On past DEXA, last check on 05/23/2019 -- T-score -2.3. Satisfied with current treatment?: yes Adequate calcium  & vitamin D : yes Weight bearing exercises: yes   ASTHMA Uses inhalers when needed. Asthma status: stable Satisfied with current treatment?: yes Albuterol /rescue inhaler frequency: rarely Dyspnea frequency: no Wheezing frequency:no Cough frequency: no Nocturnal symptom frequency: no Limitation of activity: no Current upper respiratory symptoms: no Triggers: pollen Aerochamber/spacer use: no Visits to ER or Urgent Care in past year: no Pneumovax: Up to Date  Influenza: Up to Date  Relevant past medical, surgical, family and social history reviewed and updated as indicated. Interim medical history since our last visit reviewed. Allergies and medications reviewed and updated.  Review of Systems  Constitutional:  Negative for activity change, appetite change, diaphoresis, fatigue and fever.  HENT:  Negative for ear discharge and ear pain.   Respiratory:  Negative for cough, chest tightness, shortness of breath and wheezing.   Cardiovascular:  Negative for chest pain, palpitations and leg swelling.  Endocrine: Negative for polydipsia, polyphagia and polyuria.  Neurological: Negative.   Psychiatric/Behavioral: Negative.     Per HPI unless  specifically indicated above     Objective:    BP (!) 104/58 (BP Location: Left Arm, Patient Position: Sitting, Cuff Size: Large)   Pulse 63   Temp 98.1 F (36.7 C) (Oral)   Resp 16   Ht 5' 2.01 (1.575 m)   Wt 176 lb (79.8 kg)   LMP 07/13/2000 (Approximate)   SpO2 97%   BMI 32.18 kg/m   Wt Readings from Last 3 Encounters:  06/07/24 176 lb (79.8 kg)  03/07/24 174 lb (78.9 kg)  12/29/23 175 lb (79.4 kg)    Physical Exam Vitals and nursing note reviewed.  Constitutional:      General: She is awake. She is not in acute distress.    Appearance: Normal appearance. She is well-developed and well-groomed. She is obese. She is not ill-appearing or toxic-appearing.  HENT:     Head: Normocephalic.     Right Ear: Hearing and external ear normal.     Left Ear: Hearing and external ear normal.  Eyes:     General: Lids are normal.        Right eye: No discharge.        Left eye: No discharge.     Conjunctiva/sclera: Conjunctivae normal.     Pupils: Pupils are equal, round, and reactive to light.  Neck:     Thyroid : No thyromegaly.     Vascular: No carotid bruit.  Cardiovascular:     Rate and Rhythm: Normal rate and regular rhythm.     Heart sounds: Murmur heard.     Systolic murmur is present with a grade of 2/6.     No gallop.  Pulmonary:     Effort: Pulmonary effort is normal. No accessory muscle usage or respiratory distress.     Breath sounds: Normal breath sounds.  Abdominal:     General: Bowel sounds are normal. There is no distension.     Palpations: Abdomen is soft.     Tenderness: There is no abdominal tenderness.  Musculoskeletal:     Cervical back: Normal range of motion and neck supple.     Right lower leg: No edema.     Left lower leg: No edema.  Lymphadenopathy:     Cervical: No cervical adenopathy.  Skin:    General: Skin is warm and dry.  Neurological:     Mental Status: She is alert and oriented to person, place, and time.     Deep Tendon Reflexes:  Reflexes are normal and symmetric.     Reflex Scores:      Brachioradialis reflexes are 2+ on the right side and 2+ on the left side.      Patellar reflexes are 2+ on the right side and 2+ on the left side. Psychiatric:        Attention and Perception: Attention normal.        Mood and  Affect: Mood normal.        Speech: Speech normal.        Behavior: Behavior normal. Behavior is cooperative.        Thought Content: Thought content normal.    Results for orders placed or performed in visit on 03/07/24  Bayer DCA Hb A1c Waived   Collection Time: 03/07/24  8:33 AM  Result Value Ref Range   HB A1C (BAYER DCA - WAIVED) 6.3 (H) 4.8 - 5.6 %  Comprehensive metabolic panel with GFR   Collection Time: 03/07/24  8:33 AM  Result Value Ref Range   Glucose 63 (L) 70 - 99 mg/dL   BUN 18 8 - 27 mg/dL   Creatinine, Ser 9.30 0.57 - 1.00 mg/dL   eGFR 92 >40 fO/fpw/8.26   BUN/Creatinine Ratio 26 12 - 28   Sodium 139 134 - 144 mmol/L   Potassium 3.6 3.5 - 5.2 mmol/L   Chloride 99 96 - 106 mmol/L   CO2 23 20 - 29 mmol/L   Calcium  9.4 8.7 - 10.3 mg/dL   Total Protein 6.5 6.0 - 8.5 g/dL   Albumin 4.5 3.8 - 4.8 g/dL   Globulin, Total 2.0 1.5 - 4.5 g/dL   Bilirubin Total 0.3 0.0 - 1.2 mg/dL   Alkaline Phosphatase 62 44 - 121 IU/L   AST 33 0 - 40 IU/L   ALT 31 0 - 32 IU/L  Lipid Panel w/o Chol/HDL Ratio   Collection Time: 03/07/24  8:33 AM  Result Value Ref Range   Cholesterol, Total 119 100 - 199 mg/dL   Triglycerides 50 0 - 149 mg/dL   HDL 66 >60 mg/dL   VLDL Cholesterol Cal 12 5 - 40 mg/dL   LDL Chol Calc (NIH) 41 0 - 99 mg/dL      Assessment & Plan:   Problem List Items Addressed This Visit       Cardiovascular and Mediastinum   Paroxysmal tachycardia (HCC)   Chronic, stable at present with current medication regimen.  Continue this regimen and collaboration with cardiology.      Relevant Medications   hydrochlorothiazide  (HYDRODIURIL ) 25 MG tablet   Hypertension associated with  diabetes (HCC)   Chronic, stable. BP well below goal in office today. Continue Telmisartan  for kidney and hydrochlorothiazide  for fluid retention, she is aware this medication could diminish effects of insulin  == may need to consider Amlodipine if BP trends up in future.  LABS: CMP.  Urine ALB 18 December 2023.  Recommend she continue to check BP at home on occasion and document for visits. Focus on DASH diet.  Return in 3 months.      Relevant Medications   hydrochlorothiazide  (HYDRODIURIL ) 25 MG tablet   Other Relevant Orders   Bayer DCA Hb A1c Waived     Respiratory   OSA (obstructive sleep apnea)   Chronic, does not consistently wear CPAP.  Recommend 100% use.      Asthma, intermittent   Chronic, stable.  Continue current medication regimen and adjust as needed.  Will send refills when requested as minimally uses inhalers.        Digestive   Fatty liver   Chronic, noted on past imaging. At this time recommend heavy focus on diet and regular activity + working on diabetes control with goal A1c <7%.      Relevant Orders   Comprehensive metabolic panel with GFR     Endocrine   Insulin  dependent type 2 diabetes mellitus (HCC) - Primary  Chronic, ongoing.  Urine ALB 18 December 2023.  A1c 6.5% today, remaining stable.  Pancreatitis presented with Mounjaro  & Ozempic .  Had tolerated Ozempic  well for years prior to that.  Uses Dexcom. - At this time will maintain off GLP1 or Mounjaro  due to pancreatitis in past. - Continue Jardiance  and Metformin . Continue Basaglar 40 units, reduce this if morning BS consistently less then 130 or lows <70 present - discussed at length with her.  Continue Novolog  pre-meals but recommend 5-12 units as needed only and not 20 (not to take consistently), educated her on this and not to take if does not eat a meal or take after meals. She has Glipizide  at home but rarely takes it either and is aware not to take daily. - Dexcom continues.  Continue to work with [facility]  PharmD on assistance programs. - Check blood sugar 4-5 times a day and document for provider.   - Foot and eye exams up to date - Statin and ARB on board. - Flu shot and Pneumonia vaccines up to date. Return in 3 months.      Relevant Orders   Bayer DCA Hb A1c Waived   Hyperlipidemia associated with type 2 diabetes mellitus (HCC)   Chronic, ongoing.  Will continue Rosuvastatin  daily and adjust dose as needed.  Lipid panel today.        Relevant Medications   hydrochlorothiazide  (HYDRODIURIL ) 25 MG tablet   Other Relevant Orders   Bayer DCA Hb A1c Waived   Lipid Panel w/o Chol/HDL Ratio   Comprehensive metabolic panel with GFR     Musculoskeletal and Integument   Osteopenia of neck of right femur   Chronic.  Noted on Dexa in 2020.  Repeat December Dexa 2025.  Continue daily Vit D and calcium . Check levels next visit.  She was made aware to schedule DEXA with mammogram in December.        Other   Obesity   BMI 32.18.  Recommended eating smaller high protein, low fat meals more frequently and exercising 30 mins a day 5 times a week with a goal of 10-15lb weight loss in the next 3 months. Patient voiced their understanding and motivation to adhere to these recommendations.       Other Visit Diagnoses       Flu vaccine need       Flu vaccine today, educated patient.   Relevant Orders   Flu vaccine HIGH DOSE PF(Fluzone Trivalent)        Follow up plan: Return in about 3 months (around 09/06/2024) for T2DM, HTN/HLD, ASTHMA.

## 2024-06-07 NOTE — Assessment & Plan Note (Signed)
 Chronic.  Noted on Dexa in 2020.  Repeat December Dexa 2025.  Continue daily Vit D and calcium . Check levels next visit.  She was made aware to schedule DEXA with mammogram in December.

## 2024-06-07 NOTE — Assessment & Plan Note (Signed)
Chronic, ongoing.  Will continue Rosuvastatin daily and adjust dose as needed.  Lipid panel today.

## 2024-06-07 NOTE — Assessment & Plan Note (Signed)
Chronic, stable.  Continue current medication regimen and adjust as needed.  Will send refills when requested as minimally uses inhalers. 

## 2024-06-07 NOTE — Assessment & Plan Note (Signed)
 Chronic, stable at present with current medication regimen.  Continue this regimen and collaboration with cardiology.

## 2024-06-07 NOTE — Assessment & Plan Note (Signed)
BMI 32.18.  Recommended eating smaller high protein, low fat meals more frequently and exercising 30 mins a day 5 times a week with a goal of 10-15lb weight loss in the next 3 months. Patient voiced their understanding and motivation to adhere to these recommendations.

## 2024-06-08 ENCOUNTER — Ambulatory Visit: Payer: Self-pay | Admitting: Nurse Practitioner

## 2024-06-08 LAB — COMPREHENSIVE METABOLIC PANEL WITH GFR
ALT: 39 IU/L — ABNORMAL HIGH (ref 0–32)
AST: 35 IU/L (ref 0–40)
Albumin: 4.4 g/dL (ref 3.8–4.8)
Alkaline Phosphatase: 52 IU/L (ref 49–135)
BUN/Creatinine Ratio: 13 (ref 12–28)
BUN: 12 mg/dL (ref 8–27)
Bilirubin Total: 0.4 mg/dL (ref 0.0–1.2)
CO2: 25 mmol/L (ref 20–29)
Calcium: 9.3 mg/dL (ref 8.7–10.3)
Chloride: 99 mmol/L (ref 96–106)
Creatinine, Ser: 0.89 mg/dL (ref 0.57–1.00)
Globulin, Total: 2 g/dL (ref 1.5–4.5)
Glucose: 109 mg/dL — ABNORMAL HIGH (ref 70–99)
Potassium: 4.3 mmol/L (ref 3.5–5.2)
Sodium: 136 mmol/L (ref 134–144)
Total Protein: 6.4 g/dL (ref 6.0–8.5)
eGFR: 69 mL/min/1.73 (ref >59)

## 2024-06-08 LAB — LIPID PANEL W/O CHOL/HDL RATIO
Cholesterol, Total: 85 mg/dL — ABNORMAL LOW (ref 100–199)
HDL: 39 mg/dL — ABNORMAL LOW (ref >39)
LDL Chol Calc (NIH): 23 mg/dL (ref 0–99)
Triglycerides: 129 mg/dL (ref 0–149)
VLDL Cholesterol Cal: 23 mg/dL (ref 5–40)

## 2024-06-08 NOTE — Progress Notes (Signed)
 Contacted via MyChart  Good afternoon Charlene Juarez, your labs have returned and overall look great. Lipid panel shows level well at goal.  Kidney function, creatinine and eGFR, remains stable, as is liver function, AST and ALT (only mild elevation in ALT).  Any questions? Keep being amazing!!  Thank you for allowing me to participate in your care.  I appreciate you. Kindest regards, Flora Ratz

## 2024-06-09 ENCOUNTER — Other Ambulatory Visit: Payer: Self-pay | Admitting: Nurse Practitioner

## 2024-06-11 NOTE — Telephone Encounter (Signed)
 Requested Prescriptions  Pending Prescriptions Disp Refills   Insulin  Glargine (BASAGLAR KWIKPEN) 100 UNIT/ML [Pharmacy Med Name: BASAGLAR 100 UNIT/ML KWIKPEN]  1    Sig: INJECT 50 UNITS INTO THE SKIN AT BEDTIME.     Endocrinology:  Diabetes - Insulins Passed - 06/11/2024  1:14 PM      Passed - HBA1C is between 0 and 7.9 and within 180 days    HB A1C (BAYER DCA - WAIVED)  Date Value Ref Range Status  06/07/2024 6.5 (H) 4.8 - 5.6 % Final    Comment:             Prediabetes: 5.7 - 6.4          Diabetes: >6.4          Glycemic control for adults with diabetes: <7.0          Passed - Valid encounter within last 6 months    Recent Outpatient Visits           4 days ago Insulin  dependent type 2 diabetes mellitus (HCC)   [place] [facility] [place], [place] T, NP   3 months ago Insulin  dependent type 2 diabetes mellitus (HCC)   [place] [facility] [place], Lilly T, NP   6 months ago Insulin  dependent type 2 diabetes mellitus (HCC)   [place] [facility] [place], Jolene T, NP               pantoprazole  (PROTONIX ) 20 MG tablet [Pharmacy Med Name: PANTOPRAZOLE  SOD DR 20 MG TAB] 180 tablet 2    Sig: TAKE 1 TABLET BY MOUTH TWICE A DAY     Gastroenterology: Proton Pump Inhibitors Passed - 06/11/2024  1:14 PM      Passed - Valid encounter within last 12 months    Recent Outpatient Visits           4 days ago Insulin  dependent type 2 diabetes mellitus (HCC)   [facility] [facility] [place], [place] T, NP   3 months ago Insulin  dependent type 2 diabetes mellitus (HCC)   [place] [place], [place] T, NP   6 months ago Insulin  dependent type 2 diabetes mellitus (HCC)   [place] [facility] [place], Melanie DASEN, NP

## 2024-06-19 ENCOUNTER — Telehealth: Payer: Self-pay | Admitting: Cardiovascular Disease

## 2024-06-19 ENCOUNTER — Telehealth: Payer: Self-pay

## 2024-06-19 NOTE — Telephone Encounter (Signed)
Called and left a voice mail for the patient to call back

## 2024-06-19 NOTE — Telephone Encounter (Signed)
 Copied from CRM [id number]. Topic: Clinical - Medication Question >> Jun 19, 2024  5:14 PM Everette C wrote: Reason for CRM: The patient has been unable to reach their cardiologist and would like to discuss concerns with their PCP or a member of clinical staff regarding a possible increase in their prescription for metoprolol  succinate (TOPROL  XL) 25 MG 24 hr tablet [[id number]]  Please contact further when available

## 2024-06-19 NOTE — Telephone Encounter (Signed)
 Pt c/o medication issue:  1. Name of Medication:   metoprolol  succinate (TOPROL  XL) 25 MG 24 hr tablet    2. How are you currently taking this medication (dosage and times per day)?    3. Are you having a reaction (difficulty breathing--STAT)? no  4. What is your medication issue? Patient calling to see if the dosage of medication can increase. She states she start to have rapid heartbeat. Please advise

## 2024-06-20 ENCOUNTER — Telehealth

## 2024-06-20 ENCOUNTER — Encounter: Payer: Self-pay | Admitting: Nurse Practitioner

## 2024-06-20 ENCOUNTER — Encounter: Payer: Self-pay | Admitting: Cardiovascular Disease

## 2024-06-20 NOTE — Telephone Encounter (Signed)
 Cardiology has been reaching out to assist patient. Looks like they might have last called and are waiting for a call back from her.

## 2024-06-20 NOTE — Telephone Encounter (Signed)
 Called pt for additional info on wanting to adjust medication - left message for callback

## 2024-06-20 NOTE — Telephone Encounter (Signed)
 Called patient back. No answer. Left voicemail.

## 2024-06-20 NOTE — Telephone Encounter (Signed)
 Got in touch with patient regarding heart rate and medication change request. Pt states she has been having breakthroughs of tachycardia that sometimes take her breath and she has had the high heart rate with activity as well. She was calling to inquire about upping her metoprolol  and wanting me to reach out to Dr Gollan to get his input regarding this question. Pt states she was trying to avoid an appointment because her granddaughter is getting married in [place]  in a couple months and she wanted to make sure everything was alright before then.

## 2024-06-20 NOTE — Telephone Encounter (Signed)
 Noted, will review cardiology recommendations when available

## 2024-06-20 NOTE — Telephone Encounter (Signed)
 Patient is returning call. Please advise?

## 2024-06-20 NOTE — Telephone Encounter (Signed)
  Per answering service:  Patient returning call regarding medication

## 2024-06-21 MED ORDER — DILTIAZEM HCL 60 MG PO TABS: 60.0000 mg | ORAL_TABLET | Freq: Three times a day (TID) | ORAL | 3 refills | Status: DC | PRN

## 2024-06-21 MED ORDER — METOPROLOL SUCCINATE ER 25 MG PO TB24: 25.0000 mg | ORAL_TABLET | Freq: Two times a day (BID) | ORAL | 3 refills | Status: DC

## 2024-06-21 NOTE — Addendum Note (Signed)
 Addended by: CRISTOPHER OLIVIA PARAS on: 06/21/2024 08:04 AM   Modules accepted: Orders

## 2024-07-21 ENCOUNTER — Other Ambulatory Visit: Payer: Self-pay | Admitting: Cardiovascular Disease

## 2024-07-25 ENCOUNTER — Telehealth: Payer: Self-pay | Admitting: Cardiovascular Disease

## 2024-07-25 NOTE — Telephone Encounter (Signed)
 Pt c/o medication issue:  1. Name of Medication:   metoprolol  succinate (TOPROL  XL) 25 MG 24 hr tablet    2. How are you currently taking this medication (dosage and times per day)?    3. Are you having a reaction (difficulty breathing--STAT)? no  4. What is your medication issue? Patient is calling to see if the dosage can be increase. Please advise

## 2024-07-26 ENCOUNTER — Encounter: Payer: Self-pay | Admitting: Cardiovascular Disease

## 2024-07-26 NOTE — Telephone Encounter (Signed)
 Called patient - left message that an RX was sent to her pharmacy with the new dose of 25 mg twice daily and to return call to the office if she has further questions

## 2024-07-26 NOTE — Telephone Encounter (Signed)
 Patient called again about medication increase, she stated she doesn't have access to Mychart and would like a call.

## 2024-07-26 NOTE — Telephone Encounter (Signed)
 Please see mychart encounter

## 2024-07-30 ENCOUNTER — Ambulatory Visit: Attending: Cardiovascular Disease | Admitting: Cardiovascular Disease

## 2024-07-30 ENCOUNTER — Encounter: Payer: Self-pay | Admitting: Cardiovascular Disease

## 2024-07-30 VITALS — BP 140/60 | HR 67 | Ht 62.0 in | Wt 172.8 lb

## 2024-07-30 DIAGNOSIS — I471 Supraventricular tachycardia, unspecified: Secondary | ICD-10-CM | POA: Diagnosis not present

## 2024-07-30 DIAGNOSIS — R002 Palpitations: Secondary | ICD-10-CM | POA: Diagnosis not present

## 2024-07-30 DIAGNOSIS — I152 Hypertension secondary to endocrine disorders: Secondary | ICD-10-CM | POA: Diagnosis not present

## 2024-07-30 DIAGNOSIS — E1159 Type 2 diabetes mellitus with other circulatory complications: Secondary | ICD-10-CM | POA: Insufficient documentation

## 2024-07-30 MED ORDER — METOPROLOL SUCCINATE ER 25 MG PO TB24: 25.0000 mg | ORAL_TABLET | Freq: Two times a day (BID) | ORAL | 3 refills | Status: AC

## 2024-07-30 NOTE — Patient Instructions (Signed)
 Medication Instructions:  Please increase the metoprolol  succinate up to 25 mg twice a day with extra as needed ( 3 a day)  If you need a refill on your cardiac medications before your next appointment, please call your pharmacy.   Lab work: No new labs needed  Testing/Procedures: No new testing needed  Follow-Up: At [facility], you and your health needs are our priority.  As part of our continuing mission to provide you with exceptional heart care, we have created designated Provider Care Teams.  These Care Teams include your primary Cardiologist (physician) and Advanced Practice Providers (APPs -  Physician Assistants and Nurse Practitioners) who all work together to provide you with the care you need, when you need it.  You will need a follow up appointment in 12 months  Providers on your designated Care Team:   Lonni Meager, NP Bernardino Bring, PA-C [place], [place]  COVID-19 Vaccine Information can be found at: [email or web address] For questions related to vaccine distribution or appointments, please email [email or web address] or call [phone].

## 2024-07-30 NOTE — Progress Notes (Signed)
 Cardiology Office Note  Date:  07/30/2024   ID:  Charlene Juarez, DOB [birth year], MRN [id number]  PCP:  Valerio Melanie DASEN, NP   Chief Complaint  Patient presents with   Follow-up    follow up for racing heart,palpation difficult breathing ,  No chest pain or chest pressure/ medication reviewed verbally with patient.    HPI:  [name] is a 73 year old woman with past medical history of Diabetes type 2 with hypertension SVT 07/2009, required adenosine GERD Hyperlipidemia Mild aortic atherosclerosis on CT scan September 2023 Who presents for follow-up of her palpitations  LOV 11/24 In follow-up today she reports having continued occasional tachypalpitations  She has been taking metoprolol  succinate 25 BID Also taking diltiazem  60 mg twice daily The tachycardia seems to set off her tinnitus  Heading to wedding in [place]  next wee  Prior studies reviewed Zio monitor December 2024 Normal sinus rhythm, Short episodes SVT up to 2 or 3 a day Rare APCs, PVCs  Prior imaging reviewed Echocardiogram Normal LV function No significant apical heart disease  Lab work reviewed A1C 6.5 Total chol 85, LDL 23  EKG personally reviewed by myself on todays visit EKG Interpretation Date/Time:  Monday July 30 2024 14:38:20 EST Ventricular Rate:  67 PR Interval:  208 QRS Duration:  106 QT Interval:  424 QTC Calculation: 448 R Axis:   16  Text Interpretation: Normal sinus rhythm Low voltage QRS When compared with ECG of 28-Sep-2023 01:03, No significant change was found Confirmed by Perla Lye [phone]) on 07/30/2024 2:59:02 PM   CT scan abdomen pelvis September 2023 Mild aortic atherosclerosis  PMH:   has a past medical history of Arthritis, Bursitis, Cancer (HCC), Diabetes mellitus without complication (HCC), GERD (gastroesophageal reflux disease), Hyperlipidemia, Hypertension, and Sleep apnea.  PSH:    Past Surgical History:  Procedure Laterality  Date   cancer removal off nose     CATARACT EXTRACTION W/PHACO Right 01/13/2017   Procedure: CATARACT EXTRACTION PHACO AND INTRAOCULAR LENS PLACEMENT (IOC);  Surgeon: Adine Oneil Novak, MD;  Location: [facility] ORS;  Service: Ophthalmology;  Laterality: Right;  US  31.1 AP% 0.0 CDE 1.78 Fluid Pack lot # [id number]   CATARACT EXTRACTION W/PHACO Left 02/24/2017   Procedure: CATARACT EXTRACTION PHACO AND INTRAOCULAR LENS PLACEMENT (IOC);  Surgeon: Novak Adine Oneil, MD;  Location: [facility] ORS;  Service: Ophthalmology;  Laterality: Left;  US   00:45.5 AP  0.7 CDE   2.76  fluid pack lot # [id number]  exp. 08-19-2018   COLONOSCOPY WITH PROPOFOL  N/A 12/09/2022   Procedure: COLONOSCOPY WITH PROPOFOL ;  Surgeon: Therisa Bi, MD;  Location: [facility] ENDOSCOPY;  Service: Gastroenterology;  Laterality: N/A;   DILATION AND CURETTAGE OF UTERUS     ESOPHAGOGASTRODUODENOSCOPY (EGD) WITH PROPOFOL  N/A 01/06/2022   Procedure: ESOPHAGOGASTRODUODENOSCOPY (EGD) WITH PROPOFOL ;  Surgeon: Therisa Bi, MD;  Location: [facility] ENDOSCOPY;  Service: Gastroenterology;  Laterality: N/A;   EYE SURGERY     rotator cuff surgery     TUBAL LIGATION      Current Outpatient Medications  Medication Sig Dispense Refill   albuterol  (VENTOLIN  HFA) 108 (90 Base) MCG/ACT inhaler TAKE 2 PUFFS BY MOUTH EVERY 6 HOURS AS NEEDED FOR WHEEZE OR SHORTNESS OF BREATH 18 each 2   Ascorbic Acid (VITAMIN C) 1000 MG tablet Take 1,000 mg by mouth daily.     Cholecalciferol (VITAMIN D3) 1000 units CAPS Take 1,000 Units by mouth at bedtime.     Continuous Blood Gluc Receiver (DEXCOM G7 RECEIVER) DEVI For continuous blood  sugar monitoring as injecting insulin  4 times a day and over age 33.  Dx E11.9, Z79.4 1 each 12   Continuous Blood Gluc Sensor (DEXCOM G7 SENSOR) MISC For continuous blood sugar monitoring as injecting insulin  4 times a day and over age 62. Dx E11.9, Z79.4 1 each 12   CONTOUR TEST test strip USE TO TEST BLOOD SUGAR ONCE A DAY == DX E11.69 100 strip 4    diltiazem  (CARDIZEM ) 60 MG tablet TAKE 1 TABLET (60 MG TOTAL) BY MOUTH 3 (THREE) TIMES DAILY AS NEEDED. FOR TACHYCARDIA 270 tablet 3   empagliflozin  (JARDIANCE ) 25 MG TABS tablet Take 1 tablet (25 mg total) by mouth daily. 90 tablet 3   Ginkgo Biloba 40 MG TABS Take 1 tablet by mouth daily.     glipiZIDE  (GLUCOTROL ) 5 MG tablet TAKE 1 TABLET BY MOUTH DAILY BEFORE BREAKFAST. 90 tablet 4   hydrochlorothiazide  (HYDRODIURIL ) 25 MG tablet Take 1 tablet (25 mg total) by mouth daily. 90 tablet 4   insulin  aspart (NOVOLOG  FLEXPEN) 100 UNIT/ML FlexPen INJECT 10-12 UNITS INTO THE SKIN 3 (THREE) TIMES DAILY WITH MEALS. 15 mL 3   Insulin  Glargine (BASAGLAR KWIKPEN) 100 UNIT/ML INJECT 50 UNITS INTO THE SKIN AT BEDTIME. 15 mL 5   Insulin  Pen Needle (BD PEN NEEDLE SHORT ULTRAFINE) 31G X 8 MM MISC USE ONE NEEDLE DAILY TO INJECT INSULIN  4-5 TIMES A DAY. 400 each 1   Magnesium 500 MG TABS Take 500 mg by mouth at bedtime.     metFORMIN  (GLUCOPHAGE ) 1000 MG tablet TAKE 1 TABLET (1,000 MG TOTAL) BY MOUTH TWICE A DAY WITH FOOD 180 tablet 1   metoprolol  succinate (TOPROL  XL) 25 MG 24 hr tablet Take 1 tablet (25 mg total) by mouth 2 (two) times daily. Take with or immediately following a meal. 180 tablet 3   Omega-3 Fatty Acids (FISH OIL) 1200 MG CAPS Take 1,200 mg by mouth daily.     pantoprazole  (PROTONIX ) 20 MG tablet TAKE 1 TABLET BY MOUTH TWICE A DAY 180 tablet 2   rosuvastatin  (CRESTOR ) 40 MG tablet Take 1 tablet (40 mg total) by mouth daily. 90 tablet 3   SYMBICORT  160-4.5 MCG/ACT inhaler TAKE 2 PUFFS BY MOUTH TWICE A DAY 30.6 each 3   telmisartan  (MICARDIS ) 80 MG tablet Take 1 tablet (80 mg total) by mouth daily. 90 tablet 3   No current facility-administered medications for this visit.     Allergies:   Mounjaro  [tirzepatide ], Ozempic  (0.25 or 0.5 mg-dose) [semaglutide (0.25 or 0.5mg -dos)], Codeine sulfate, and Lisinopril   Social History:  The patient  reports that she has never smoked. She has never been  exposed to tobacco smoke. She has never used smokeless tobacco. She reports that she does not drink alcohol and does not use drugs.   Family History:   family history includes Alcohol abuse in her father and mother; Aneurysm in her sister; Breast cancer in her maternal aunt; Diabetes in her brother, maternal grandmother, sister, sister, and sister; Heart disease in her father; Hyperlipidemia in her sister; Hypertension in her brother and sister; Thyroid  disease in her daughter.    Review of Systems: Review of Systems  Constitutional: Negative.   HENT: Negative.    Respiratory: Negative.    Cardiovascular:  Positive for palpitations.  Gastrointestinal: Negative.   Musculoskeletal: Negative.   Neurological: Negative.   Psychiatric/Behavioral: Negative.    All other systems reviewed and are negative.  PHYSICAL EXAM: VS:  BP (!) 140/60 (BP Location: Left Arm, Patient  Position: Sitting, Cuff Size: Normal)   Pulse 67   Ht 5' 2 (1.575 m)   Wt 172 lb 12.8 oz (78.4 kg)   LMP 07/13/2000 (Approximate)   SpO2 97%   BMI 31.61 kg/m  , BMI Body mass index is 31.61 kg/m. Constitutional:  oriented to person, place, and time. No distress.  HENT:  Head: Normocephalic and atraumatic.  Eyes:  no discharge. No scleral icterus.  Neck: Normal range of motion. Neck supple. No JVD present.  Cardiovascular: Normal rate, regular rhythm, normal heart sounds and intact distal pulses. Exam reveals no gallop and no friction rub. No edema No murmur heard. Pulmonary/Chest: Effort normal and breath sounds normal. No stridor. No respiratory distress.  no wheezes.  no rales.  no tenderness.  Abdominal: Soft.  no distension.  no tenderness.  Musculoskeletal: Normal range of motion.  no  tenderness or deformity.  Neurological:  normal muscle tone. Coordination normal. No atrophy Skin: Skin is warm and dry. No rash noted. not diaphoretic.  Psychiatric:  normal mood and affect. behavior is normal. Thought content  normal.   Recent Labs: 09/27/2023: TSH 5.929 11/30/2023: Hemoglobin 14.9; Magnesium 2.0; Platelets 268 06/07/2024: ALT 39; BUN 12; Creatinine, Ser 0.89; Potassium 4.3; Sodium 136    Lipid Panel Lab Results  Component Value Date   CHOL 85 (L) 06/07/2024   HDL 39 (L) 06/07/2024   LDLCALC 23 06/07/2024   TRIG 129 06/07/2024     Wt Readings from Last 3 Encounters:  07/30/24 172 lb 12.8 oz (78.4 kg)  06/07/24 176 lb (79.8 kg)  03/07/24 174 lb (78.9 kg)     ASSESSMENT AND PLAN:  Problem List Items Addressed This Visit       Cardiology Problems   Hypertension associated with diabetes (HCC)   Relevant Orders   EKG 12-Lead (Completed)   Other Visit Diagnoses       SVT (supraventricular tachycardia)    -  Primary   Relevant Orders   EKG 12-Lead (Completed)     Intermittent palpitations       Relevant Orders   EKG 12-Lead (Completed)      Paroxysmal tachycardia/SVT Prior history SVT requiring adenosine 2010,  Rate at that time 190 bpm - Symptoms relatively well-controlled on metoprolol  succinate 25 twice daily diltiazem  60 twice daily -She is having breakthrough arrhythmia, would like to take extra metoprolol  succinate as needed -Prescription sent in for 3 metoprolol  succinate today -May need to take 50 in the morning 25 in the evening or 37.5 twice daily  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 [facility] [place], [place] [phone]

## 2024-08-30 ENCOUNTER — Ambulatory Visit
Admission: RE | Admit: 2024-08-30 | Discharge: 2024-08-30 | Disposition: A | Discharge status: Home / Self Care | Source: Ambulatory Visit | Attending: Nurse Practitioner | Admitting: Nurse Practitioner

## 2024-08-30 ENCOUNTER — Encounter: Payer: Self-pay | Admitting: Nurse Practitioner

## 2024-08-30 ENCOUNTER — Ambulatory Visit: Payer: Self-pay | Admitting: Nurse Practitioner

## 2024-08-30 DIAGNOSIS — M810 Age-related osteoporosis without current pathological fracture: Secondary | ICD-10-CM | POA: Diagnosis not present

## 2024-08-30 DIAGNOSIS — Z1231 Encounter for screening mammogram for malignant neoplasm of breast: Secondary | ICD-10-CM | POA: Insufficient documentation

## 2024-08-30 DIAGNOSIS — Z780 Asymptomatic menopausal state: Secondary | ICD-10-CM | POA: Insufficient documentation

## 2024-08-30 NOTE — Progress Notes (Signed)
 Contacted via MyChart  Good evening Charlene Juarez, your bone density has returned and on this check it is showing some advancement to osteoporosis. You are scheduled to see my on 09/06/24, at that visit we will discuss what this means and treatments for this. Any questions? Keep being amazing!!  Thank you for allowing me to participate in your care.  I appreciate you. Kindest regards, Kaileena Obi

## 2024-09-02 NOTE — Patient Instructions (Incomplete)

## 2024-09-05 NOTE — Progress Notes (Signed)
 Contacted via MyChart   Normal mammogram, may repeat in one year:)

## 2024-09-06 ENCOUNTER — Encounter: Payer: Self-pay | Admitting: Nurse Practitioner

## 2024-09-06 ENCOUNTER — Ambulatory Visit: Admitting: Nurse Practitioner

## 2024-09-06 VITALS — BP 122/66 | HR 62 | Temp 98.2°F | Resp 15 | Ht 62.01 in | Wt 177.8 lb

## 2024-09-06 DIAGNOSIS — E6609 Other obesity due to excess calories: Secondary | ICD-10-CM | POA: Diagnosis not present

## 2024-09-06 DIAGNOSIS — E1169 Type 2 diabetes mellitus with other specified complication: Secondary | ICD-10-CM | POA: Diagnosis not present

## 2024-09-06 DIAGNOSIS — M810 Age-related osteoporosis without current pathological fracture: Secondary | ICD-10-CM | POA: Diagnosis not present

## 2024-09-06 DIAGNOSIS — I479 Paroxysmal tachycardia, unspecified: Secondary | ICD-10-CM

## 2024-09-06 DIAGNOSIS — I152 Hypertension secondary to endocrine disorders: Secondary | ICD-10-CM

## 2024-09-06 DIAGNOSIS — E66811 Obesity, class 1: Secondary | ICD-10-CM

## 2024-09-06 DIAGNOSIS — E119 Type 2 diabetes mellitus without complications: Secondary | ICD-10-CM

## 2024-09-06 DIAGNOSIS — G4733 Obstructive sleep apnea (adult) (pediatric): Secondary | ICD-10-CM

## 2024-09-06 DIAGNOSIS — K760 Fatty (change of) liver, not elsewhere classified: Secondary | ICD-10-CM | POA: Diagnosis not present

## 2024-09-06 DIAGNOSIS — Z794 Long term (current) use of insulin: Secondary | ICD-10-CM

## 2024-09-06 DIAGNOSIS — E785 Hyperlipidemia, unspecified: Secondary | ICD-10-CM

## 2024-09-06 DIAGNOSIS — E1159 Type 2 diabetes mellitus with other circulatory complications: Secondary | ICD-10-CM | POA: Diagnosis not present

## 2024-09-06 DIAGNOSIS — Z1231 Encounter for screening mammogram for malignant neoplasm of breast: Secondary | ICD-10-CM

## 2024-09-06 LAB — BAYER DCA HB A1C WAIVED: HB A1C (BAYER DCA - WAIVED): 6.2 % — ABNORMAL HIGH (ref 4.8–5.6)

## 2024-09-06 MED ORDER — ALENDRONATE SODIUM 70 MG PO TABS: 70.0000 mg | ORAL_TABLET | ORAL | 11 refills | Status: AC

## 2024-09-06 NOTE — Assessment & Plan Note (Signed)
 Chronic, stable at present with current medication regimen.  Continue this regimen and collaboration with cardiology.

## 2024-09-06 NOTE — Assessment & Plan Note (Signed)
 Chronic, stable. BP well below goal in office today. Continue Telmisartan  for kidney and hydrochlorothiazide  for fluid retention, she is aware this medication could diminish effects of insulin  == may need to consider Amlodipine if BP trends up in future.  LABS: CMP.  Urine ALB 18 December 2023.  Recommend she continue to check BP at home on occasion and document for visits. Focus on DASH diet.  Continue to collaborate with cardiology. Return in 3 months.

## 2024-09-06 NOTE — Assessment & Plan Note (Signed)
BMI 32.51. Recommended eating smaller high protein, low fat meals more frequently and exercising 30 mins a day 5 times a week with a goal of 10-15lb weight loss in the next 3 months. Patient voiced their understanding and motivation to adhere to these recommendations.

## 2024-09-06 NOTE — Assessment & Plan Note (Signed)
 Chronic, ongoing.  Urine ALB 18 December 2023.  A1c 6.2% today, remaining stable and slight trend down.  Pancreatitis presented with Mounjaro  & Ozempic .  Had tolerated Ozempic  well for years prior to that.  Uses Dexcom and is aware to bring to all visits. - At this time will maintain off GLP1 or Mounjaro  due to pancreatitis in past. - Continue Jardiance  and Metformin . Continue Basaglar 40 units, reduce this if morning BS consistently less then 130 or lows <70 present - discussed at length with her.  Continue Novolog  pre-meals but recommend 5-12 units as needed only and no higher, educated her on this and not to take if does not eat a meal or take after meals. She has Glipizide  at home but rarely takes it either and is aware not to take daily due to risks for low levels. - Dexcom continues.  Continue to work with [facility] on assistance programs. - Check blood sugar 4-5 times a day and document for provider.   - Foot exam up to date, needs eye exam. - Statin and ARB on board. - Flu shot and Pneumonia vaccines up to date. Return in 3 months.

## 2024-09-06 NOTE — Assessment & Plan Note (Addendum)
 Chronic, noted on past imaging. At this time recommend heavy focus on diet and regular activity + working on maintaining diabetes control with goal A1c <7%.

## 2024-09-06 NOTE — Assessment & Plan Note (Signed)
 Chronic, does not consistently wear CPAP.  Recommend 100% use, may need different mask in future.

## 2024-09-06 NOTE — Assessment & Plan Note (Signed)
 New diagnosis on 08/30/24, scores worsening. Discussed at length with her today and educated her on osteoporosis/risks + we discussed treatment options. Will start with Fosamax  weekly. Educated her on this and side effects to monitor for and report. If does not tolerate could consider Prolia. Continue Vitamin D  at home and calcium  rich diet + ensure low bearing weight exercises 4 to 5 days a week

## 2024-09-06 NOTE — Progress Notes (Signed)
 BP 122/66 (BP Location: Left Arm, Patient Position: Sitting, Cuff Size: Large)   Pulse 62   Temp 98.2 F (36.8 C) (Oral)   Resp 15   Ht 5' 2.01 (1.575 m)   Wt 177 lb 12.8 oz (80.6 kg)   LMP 07/13/2000   SpO2 97%   BMI 32.51 kg/m    Subjective:    Patient ID: Charlene Juarez, female    DOB: 01-28-1951, 73 y.o.   MRN: [id number]  HPI: Charlene Juarez is a 73 y.o. female  Chief Complaint  Patient presents with   Diabetes    Uses her Dexcom G7 and runs around 99. Usually a bit higher after eating.    Sleep Apnea    Does not tend to use her machine but 2 nights a week. Says she develops sinus infections when using.    DIABETES September A1c 6.5%. Continues Basaglar 40 units, Jardiance  25 MG, Metformin  1000 MG BID, Novolog  as needed before meals (has not been using like she was, maybe once a day, occasionally twice and uses 12 units). Has Glipizide  but rarely takes this. Recently started back on her B12 supplement. Dexcom over past 90 days: 69% in range, 29% high, and 1% low, average glucose 160.  Has been eating and baking a little more over Christmas. May have to change long acting insulin  in new year due to new insurance.  History: Cannot take GLP1 or Mounjaro  due to pancreatitis with these. Fatty liver noted on past imaging. Has known fatty liver. Hypoglycemic episodes:no Polydipsia/polyuria: no Visual disturbance: no Chest pain: no Paresthesias: no Glucose Monitoring: no             Accucheck frequency: Dexcom             Fasting glucose:              Post prandial:             Evening:              Before meals: Taking Insulin ?: yes             Long acting insulin : 40 units             Short acting insulin : as above Blood Pressure Monitoring: yes Retinal Examination: Not Up To Date -- [facility] Foot Exam: Up to Date Pneumovax: Up to Date Influenza: Up to Date Aspirin: no   HYPERTENSION / HYPERLIPIDEMIA Taking Telmisartan , HCTZ, Metoprolol   (can take third dose if needed) and Crestor . Has reassuring work-up for palpitations with cardiology in past, last visit with them 07/30/24. Takes Diltiazem  as needed for palpitations (often takes twice a day to control symptoms).  Uses CPAP about 25% of the time, the mask bothers her sinuses. Satisfied with current treatment? yes Duration of hypertension: chronic BP monitoring frequency: not often BP range:  BP medication side effects: no Duration of hyperlipidemia: chronic Cholesterol medication side effects: no Cholesterol supplements: fish oil Medication compliance: good compliance Aspirin: no Recent stressors: no Recurrent headaches: no Visual changes: no Palpitations: yes, on occasion -- last one month ago Dyspnea: no Chest pain: no Lower extremity edema: no Dizzy/lightheaded: no The ASCVD Risk score (Arnett DK, et al., 2019) failed to calculate for the following reasons:   The valid total cholesterol range is 130 to 320 mg/dL  OSTEOPOROSIS Past imaging noted osteopenia, but recent imaging did note worsening T-score of -2.6 at right femoral neck on 08/30/24. No recent falls or fractures. Satisfied with current  treatment?: no current treatment Past osteoporosis medications/treatments: none Adequate calcium  & vitamin D : yes Intolerance to bisphosphonates: unknown Weight bearing exercises: yes   Relevant past medical, surgical, family and social history reviewed and updated as indicated. Interim medical history since our last visit reviewed. Allergies and medications reviewed and updated.  Review of Systems  Constitutional:  Negative for activity change, appetite change, diaphoresis, fatigue and fever.  HENT:  Negative for ear discharge and ear pain.   Respiratory:  Negative for cough, chest tightness, shortness of breath and wheezing.   Cardiovascular:  Negative for chest pain, palpitations and leg swelling.  Endocrine: Negative for polydipsia, polyphagia and polyuria.   Neurological: Negative.   Psychiatric/Behavioral: Negative.     Per HPI unless specifically indicated above     Objective:    BP 122/66 (BP Location: Left Arm, Patient Position: Sitting, Cuff Size: Large)   Pulse 62   Temp 98.2 F (36.8 C) (Oral)   Resp 15   Ht 5' 2.01 (1.575 m)   Wt 177 lb 12.8 oz (80.6 kg)   LMP 07/13/2000   SpO2 97%   BMI 32.51 kg/m   Wt Readings from Last 3 Encounters:  09/06/24 177 lb 12.8 oz (80.6 kg)  07/30/24 172 lb 12.8 oz (78.4 kg)  06/07/24 176 lb (79.8 kg)    Physical Exam Vitals and nursing note reviewed.  Constitutional:      General: She is awake. She is not in acute distress.    Appearance: Normal appearance. She is well-developed and well-groomed. She is obese. She is not ill-appearing or toxic-appearing.  HENT:     Head: Normocephalic.     Right Ear: Hearing and external ear normal.     Left Ear: Hearing and external ear normal.  Eyes:     General: Lids are normal.        Right eye: No discharge.        Left eye: No discharge.     Conjunctiva/sclera: Conjunctivae normal.     Pupils: Pupils are equal, round, and reactive to light.  Neck:     Thyroid : No thyromegaly.     Vascular: No carotid bruit.  Cardiovascular:     Rate and Rhythm: Normal rate and regular rhythm.     Heart sounds: Murmur heard.     Systolic murmur is present with a grade of 2/6.     No gallop.  Pulmonary:     Effort: Pulmonary effort is normal. No accessory muscle usage or respiratory distress.     Breath sounds: Normal breath sounds.  Abdominal:     General: Bowel sounds are normal. There is no distension.     Palpations: Abdomen is soft.     Tenderness: There is no abdominal tenderness.  Musculoskeletal:     Cervical back: Normal range of motion and neck supple.     Right lower leg: No edema.     Left lower leg: No edema.  Lymphadenopathy:     Cervical: No cervical adenopathy.  Skin:    General: Skin is warm and dry.  Neurological:     Mental  Status: She is alert and oriented to person, place, and time.     Deep Tendon Reflexes: Reflexes are normal and symmetric.     Reflex Scores:      Brachioradialis reflexes are 2+ on the right side and 2+ on the left side.      Patellar reflexes are 2+ on the right side and 2+ on the left  side. Psychiatric:        Attention and Perception: Attention normal.        Mood and Affect: Mood normal.        Speech: Speech normal.        Behavior: Behavior normal. Behavior is cooperative.        Thought Content: Thought content normal.    Results for orders placed or performed in visit on 06/07/24  Lipid Panel w/o Chol/HDL Ratio   Collection Time: 06/07/24  8:23 AM  Result Value Ref Range   Cholesterol, Total 85 (L) 100 - 199 mg/dL   Triglycerides 870 0 - 149 mg/dL   HDL 39 (L) >60 mg/dL   VLDL Cholesterol Cal 23 5 - 40 mg/dL   LDL Chol Calc (NIH) 23 0 - 99 mg/dL  Comprehensive metabolic panel with GFR   Collection Time: 06/07/24  8:23 AM  Result Value Ref Range   Glucose 109 (H) 70 - 99 mg/dL   BUN 12 8 - 27 mg/dL   Creatinine, Ser 9.10 0.57 - 1.00 mg/dL   eGFR 69 >40 fO/fpw/8.26   BUN/Creatinine Ratio 13 12 - 28   Sodium 136 134 - 144 mmol/L   Potassium 4.3 3.5 - 5.2 mmol/L   Chloride 99 96 - 106 mmol/L   CO2 25 20 - 29 mmol/L   Calcium  9.3 8.7 - 10.3 mg/dL   Total Protein 6.4 6.0 - 8.5 g/dL   Albumin 4.4 3.8 - 4.8 g/dL   Globulin, Total 2.0 1.5 - 4.5 g/dL   Bilirubin Total 0.4 0.0 - 1.2 mg/dL   Alkaline Phosphatase 52 49 - 135 IU/L   AST 35 0 - 40 IU/L   ALT 39 (H) 0 - 32 IU/L  Bayer DCA Hb A1c Waived   Collection Time: 06/07/24  8:42 AM  Result Value Ref Range   HB A1C (BAYER DCA - WAIVED) 6.5 (H) 4.8 - 5.6 %      Assessment & Plan:   Problem List Items Addressed This Visit       Cardiovascular and Mediastinum   Paroxysmal tachycardia (HCC)   Chronic, stable at present with current medication regimen.  Continue this regimen and collaboration with cardiology.       Hypertension associated with diabetes (HCC)   Chronic, stable. BP well below goal in office today. Continue Telmisartan  for kidney and hydrochlorothiazide  for fluid retention, she is aware this medication could diminish effects of insulin  == may need to consider Amlodipine if BP trends up in future.  LABS: CMP.  Urine ALB 18 December 2023.  Recommend she continue to check BP at home on occasion and document for visits. Focus on DASH diet.  Continue to collaborate with cardiology. Return in 3 months.      Relevant Orders   Bayer DCA Hb A1c Waived     Respiratory   OSA (obstructive sleep apnea)   Chronic, does not consistently wear CPAP.  Recommend 100% use, may need different mask in future.        Digestive   Fatty liver   Chronic, noted on past imaging. At this time recommend heavy focus on diet and regular activity + working on maintaining diabetes control with goal A1c <7%.      Relevant Orders   Comprehensive metabolic panel with GFR     Endocrine   Insulin  dependent type 2 diabetes mellitus (HCC) - Primary   Chronic, ongoing.  Urine ALB 18 December 2023.  A1c 6.2% today, remaining stable  and slight trend down.  Pancreatitis presented with Mounjaro  & Ozempic .  Had tolerated Ozempic  well for years prior to that.  Uses Dexcom and is aware to bring to all visits. - At this time will maintain off GLP1 or Mounjaro  due to pancreatitis in past. - Continue Jardiance  and Metformin . Continue Basaglar 40 units, reduce this if morning BS consistently less then 130 or lows <70 present - discussed at length with her.  Continue Novolog  pre-meals but recommend 5-12 units as needed only and no higher, educated her on this and not to take if does not eat a meal or take after meals. She has Glipizide  at home but rarely takes it either and is aware not to take daily due to risks for low levels. - Dexcom continues.  Continue to work with [facility] on assistance programs. - Check blood sugar 4-5 times a day and  document for provider.   - Foot exam up to date, needs eye exam. - Statin and ARB on board. - Flu shot and Pneumonia vaccines up to date. Return in 3 months.      Relevant Orders   Bayer DCA Hb A1c Waived   Hyperlipidemia associated with type 2 diabetes mellitus (HCC)   Chronic, ongoing.  Will continue Rosuvastatin  daily and adjust dose as needed.  Lipid panel today.        Relevant Orders   Bayer DCA Hb A1c Waived   Comprehensive metabolic panel with GFR   Lipid Panel w/o Chol/HDL Ratio     Musculoskeletal and Integument   Osteoporosis   New diagnosis on 08/30/24, scores worsening. Discussed at length with her today and educated her on osteoporosis/risks + we discussed treatment options. Will start with Fosamax  weekly. Educated her on this and side effects to monitor for and report. If does not tolerate could consider Prolia. Continue Vitamin D  at home and calcium  rich diet + ensure low bearing weight exercises 4 to 5 days a week      Relevant Medications   alendronate  (FOSAMAX ) 70 MG tablet   Other Relevant Orders   VITAMIN D  25 Hydroxy (Vit-D Deficiency, Fractures)     Other   Obesity   BMI 32.51.  Recommended eating smaller high protein, low fat meals more frequently and exercising 30 mins a day 5 times a week with a goal of 10-15lb weight loss in the next 3 months. Patient voiced their understanding and motivation to adhere to these recommendations.         Follow up plan: Return in about 4 weeks (around 10/04/2024) for OSTEOPOROSIS (started Fosamax  09/06/24) + 3 month visit for Annual exam after 11/29/24.

## 2024-09-06 NOTE — Assessment & Plan Note (Signed)
Chronic, ongoing.  Will continue Rosuvastatin daily and adjust dose as needed.  Lipid panel today.

## 2024-09-07 ENCOUNTER — Ambulatory Visit: Payer: Self-pay | Admitting: Nurse Practitioner

## 2024-09-07 LAB — VITAMIN D 25 HYDROXY (VIT D DEFICIENCY, FRACTURES): Vit D, 25-Hydroxy: 56.5 ng/mL (ref 30.0–100.0)

## 2024-09-07 LAB — COMPREHENSIVE METABOLIC PANEL WITH GFR
ALT: 24 IU/L (ref 0–32)
AST: 27 IU/L (ref 0–40)
Albumin: 4.3 g/dL (ref 3.8–4.8)
Alkaline Phosphatase: 54 IU/L (ref 49–135)
BUN/Creatinine Ratio: 22 (ref 12–28)
BUN: 18 mg/dL (ref 8–27)
Bilirubin Total: 0.4 mg/dL (ref 0.0–1.2)
CO2: 24 mmol/L (ref 20–29)
Calcium: 9.5 mg/dL (ref 8.7–10.3)
Chloride: 101 mmol/L (ref 96–106)
Creatinine, Ser: 0.81 mg/dL (ref 0.57–1.00)
Globulin, Total: 2 g/dL (ref 1.5–4.5)
Glucose: 78 mg/dL (ref 70–99)
Potassium: 3.9 mmol/L (ref 3.5–5.2)
Sodium: 141 mmol/L (ref 134–144)
Total Protein: 6.3 g/dL (ref 6.0–8.5)
eGFR: 77 mL/min/1.73

## 2024-09-07 LAB — LIPID PANEL W/O CHOL/HDL RATIO
Cholesterol, Total: 155 mg/dL (ref 100–199)
HDL: 47 mg/dL
LDL Chol Calc (NIH): 80 mg/dL (ref 0–99)
Triglycerides: 162 mg/dL — ABNORMAL HIGH (ref 0–149)
VLDL Cholesterol Cal: 28 mg/dL (ref 5–40)

## 2024-09-07 NOTE — Progress Notes (Signed)
 Contacted via MyChart  Good morning Charlene Juarez, your labs have returned and overall are stable with exception of some elevation in triglycerides and LDL has trended up above goal. Last two checks it was well below goal. Are you taking Rosuvastatin  daily? Ensure to take this as ordered. Any questions? Keep being amazing!!  Thank you for allowing me to participate in your care.  I appreciate you. Kindest regards, Halie Gass

## 2024-09-29 NOTE — Patient Instructions (Incomplete)
Be Involved in Caring For Your Health:  Taking Medications When medications are taken as directed, they can greatly improve your health. But if they are not taken as prescribed, they may not work. In some cases, not taking them correctly can be harmful. To help ensure your treatment remains effective and safe, understand your medications and how to take them. Bring your medications to each visit for review by your provider.  Your lab results, notes, and after visit summary will be available on My Chart. We strongly encourage you to use this feature. If lab results are abnormal the clinic will contact you with the appropriate steps. If the clinic does not contact you assume the results are satisfactory. You can always view your results on My Chart. If you have questions regarding your health or results, please contact the clinic during office hours. You can also ask questions on My Chart.  We at [facility] are grateful that you chose [place] to provide your care. We strive to provide evidence-based and compassionate care and are always looking for feedback. If you get a survey from the clinic please complete this so we can hear your opinions.  Eating Plan for Osteoporosis Osteoporosis causes your bones to become weak and brittle. This puts you at greater risk for bone breaks (fractures) from small bumps or falls. Making changes to your diet and increasing your physical activity can help strengthen your bones and improve your overall health. Calcium and vitamin D are nutrients that play an important role in bone health. Vitamin D helps your body use calcium and strengthen bones. It is important to get enough calcium and vitamin D as part of your eating plan for osteoporosis. What are tips for following this plan? Reading food labels Try to get at least 1,000 milligrams (mg) of calcium each day. Look for foods that have at least 50 mg of calcium per serving. Talk with your health care provider  about taking a calcium supplement if you do not get enough calcium from food. Do not have more than 2,500 mg of calcium each day. This is the upper limit for food and nutritional supplements combined. Too much calcium may cause constipation and prevent you from absorbing other important nutrients. Choose foods that contain vitamin D. Take a daily vitamin supplement that contains 800-1,000 international units (IU) of vitamin D. The amount may be different depending on your age, body weight, and where you live. Talk with your dietitian or health care provider about how much vitamin D is right for you. Avoid foods that have more than 300 mg of sodium per serving. Too much sodium can cause your body to lose calcium. Talk with your dietitian or health care provider about how much sodium you are allowed each day. Shopping Do not buy foods with added salt, including: Salted snacks. Rosita Fire. Canned soups. Canned meats. Processed meats, such as bacon or precooked or cured meat like sausages or meat loaves. Smoked fish. Meal planning Eat balanced meals that contain protein foods, fruits and vegetables, and foods rich in calcium and vitamin D. Eat at least 5 servings of fruits and vegetables each day. Eat 5-6 oz (142-170 g) of lean meat, poultry, fish, eggs, or beans each day. Lifestyle Do not use any products that contain nicotine or tobacco, such as cigarettes, e-cigarettes, and chewing tobacco. If you need help quitting, ask your health care provider. If your health care provider recommends that you lose weight: Work with a dietitian to develop an eating  plan that will help you reach your desired weight goal. Exercise for at least 30 minutes a day, 5 or more days a week, or as told by your health care provider. Work with a physical therapist to develop an exercise plan that includes flexibility, balance, and strength exercises. Do not focus only on aerobic exercise. Do not drink alcohol if: Your  health care provider tells you not to drink. You are pregnant, may be pregnant, or are planning to become pregnant. If you drink alcohol: Limit how much you use to: 0-1 drink a day for women. 0-2 drinks a day for men. Be aware of how much alcohol is in your drink. In the [place] one drink equals one 12 oz bottle of beer (355 mL), one 5 oz glass of wine (148 mL), or one 1 oz glass of hard liquor (44 mL). What foods should I eat? Foods high in calcium  Yogurt. Yogurt with fruit. Milk. Evaporated skim milk. Dry milk powder. Calcium-fortified orange juice. Parmesan cheese. Part-skim ricotta cheese. Natural hard cheese. Cream cheese. Cottage cheese. Canned sardines. Canned salmon. Calcium-treated tofu. Calcium-fortified cereal bar. Calcium-fortified cereal. Calcium-fortified graham crackers. Cooked collard greens. Turnip greens. Broccoli. Kale. Almonds. White beans. Corn tortilla. Foods high in vitamin D Cod liver oil. Fatty fish, such as tuna, mackerel, and salmon. Milk. Fortified soy milk. Fortified fruit juice. Yogurt. Margarine. Egg yolks. Foods high in protein Beef. Lamb. Pork tenderloin. Chicken breast. Tuna (canned). Fish fillet. Tofu. Cooked soy beans. Soy patty. Beans (canned or cooked). Cottage cheese. Yogurt. Peanut butter. Pumpkin seeds. Nuts. Sunflower seeds. Hard cheese. Milk or other milk products, such as soy milk. The items listed above may not be a complete list of foods and beverages you can eat. Contact a dietitian for more options. Summary Calcium and vitamin D are nutrients that play an important role in bone health and are an important part of your eating plan for osteoporosis. Eat balanced meals that contain protein foods, fruits and vegetables, and foods rich in calcium and vitamin D. Avoid foods that have more than 300 mg of sodium per serving. Too much sodium can cause your body to lose calcium. Exercise is an important part of prevention and treatment  of osteoporosis. Aim for at least 30 minutes a day, 5 days a week. This information is not intended to replace advice given to you by your health care provider. Make sure you discuss any questions you have with your health care provider. Document Revised: 02/21/2020 Document Reviewed: 02/21/2020 Elsevier Patient Education  2024 [employer].

## 2024-10-04 ENCOUNTER — Ambulatory Visit: Admitting: Nurse Practitioner

## 2024-10-13 NOTE — Patient Instructions (Signed)
Be Involved in Caring For Your Health:  Taking Medications When medications are taken as directed, they can greatly improve your health. But if they are not taken as prescribed, they may not work. In some cases, not taking them correctly can be harmful. To help ensure your treatment remains effective and safe, understand your medications and how to take them. Bring your medications to each visit for review by your provider.  Your lab results, notes, and after visit summary will be available on My Chart. We strongly encourage you to use this feature. If lab results are abnormal the clinic will contact you with the appropriate steps. If the clinic does not contact you assume the results are satisfactory. You can always view your results on My Chart. If you have questions regarding your health or results, please contact the clinic during office hours. You can also ask questions on My Chart.  We at [facility] are grateful that you chose [place] to provide your care. We strive to provide evidence-based and compassionate care and are always looking for feedback. If you get a survey from the clinic please complete this so we can hear your opinions.  Eating Plan for Osteoporosis Osteoporosis causes your bones to become weak and brittle. This puts you at greater risk for bone breaks (fractures) from small bumps or falls. Making changes to your diet and increasing your physical activity can help strengthen your bones and improve your overall health. Calcium and vitamin D are nutrients that play an important role in bone health. Vitamin D helps your body use calcium and strengthen bones. It is important to get enough calcium and vitamin D as part of your eating plan for osteoporosis. What are tips for following this plan? Reading food labels Try to get at least 1,000 milligrams (mg) of calcium each day. Look for foods that have at least 50 mg of calcium per serving. Talk with your health care provider  about taking a calcium supplement if you do not get enough calcium from food. Do not have more than 2,500 mg of calcium each day. This is the upper limit for food and nutritional supplements combined. Too much calcium may cause constipation and prevent you from absorbing other important nutrients. Choose foods that contain vitamin D. Take a daily vitamin supplement that contains 800-1,000 international units (IU) of vitamin D. The amount may be different depending on your age, body weight, and where you live. Talk with your dietitian or health care provider about how much vitamin D is right for you. Avoid foods that have more than 300 mg of sodium per serving. Too much sodium can cause your body to lose calcium. Talk with your dietitian or health care provider about how much sodium you are allowed each day. Shopping Do not buy foods with added salt, including: Salted snacks. Rosita Fire. Canned soups. Canned meats. Processed meats, such as bacon or precooked or cured meat like sausages or meat loaves. Smoked fish. Meal planning Eat balanced meals that contain protein foods, fruits and vegetables, and foods rich in calcium and vitamin D. Eat at least 5 servings of fruits and vegetables each day. Eat 5-6 oz (142-170 g) of lean meat, poultry, fish, eggs, or beans each day. Lifestyle Do not use any products that contain nicotine or tobacco, such as cigarettes, e-cigarettes, and chewing tobacco. If you need help quitting, ask your health care provider. If your health care provider recommends that you lose weight: Work with a dietitian to develop an eating  plan that will help you reach your desired weight goal. Exercise for at least 30 minutes a day, 5 or more days a week, or as told by your health care provider. Work with a physical therapist to develop an exercise plan that includes flexibility, balance, and strength exercises. Do not focus only on aerobic exercise. Do not drink alcohol if: Your  health care provider tells you not to drink. You are pregnant, may be pregnant, or are planning to become pregnant. If you drink alcohol: Limit how much you use to: 0-1 drink a day for women. 0-2 drinks a day for men. Be aware of how much alcohol is in your drink. In the [place] one drink equals one 12 oz bottle of beer (355 mL), one 5 oz glass of wine (148 mL), or one 1 oz glass of hard liquor (44 mL). What foods should I eat? Foods high in calcium  Yogurt. Yogurt with fruit. Milk. Evaporated skim milk. Dry milk powder. Calcium-fortified orange juice. Parmesan cheese. Part-skim ricotta cheese. Natural hard cheese. Cream cheese. Cottage cheese. Canned sardines. Canned salmon. Calcium-treated tofu. Calcium-fortified cereal bar. Calcium-fortified cereal. Calcium-fortified graham crackers. Cooked collard greens. Turnip greens. Broccoli. Kale. Almonds. White beans. Corn tortilla. Foods high in vitamin D Cod liver oil. Fatty fish, such as tuna, mackerel, and salmon. Milk. Fortified soy milk. Fortified fruit juice. Yogurt. Margarine. Egg yolks. Foods high in protein Beef. Lamb. Pork tenderloin. Chicken breast. Tuna (canned). Fish fillet. Tofu. Cooked soy beans. Soy patty. Beans (canned or cooked). Cottage cheese. Yogurt. Peanut butter. Pumpkin seeds. Nuts. Sunflower seeds. Hard cheese. Milk or other milk products, such as soy milk. The items listed above may not be a complete list of foods and beverages you can eat. Contact a dietitian for more options. Summary Calcium and vitamin D are nutrients that play an important role in bone health and are an important part of your eating plan for osteoporosis. Eat balanced meals that contain protein foods, fruits and vegetables, and foods rich in calcium and vitamin D. Avoid foods that have more than 300 mg of sodium per serving. Too much sodium can cause your body to lose calcium. Exercise is an important part of prevention and treatment  of osteoporosis. Aim for at least 30 minutes a day, 5 days a week. This information is not intended to replace advice given to you by your health care provider. Make sure you discuss any questions you have with your health care provider. Document Revised: 02/21/2020 Document Reviewed: 02/21/2020 Elsevier Patient Education  2024 [employer].

## 2024-10-17 ENCOUNTER — Encounter: Payer: Self-pay | Admitting: Nurse Practitioner

## 2024-10-17 ENCOUNTER — Ambulatory Visit: Admitting: Nurse Practitioner

## 2024-10-17 VITALS — BP 119/67 | HR 66 | Temp 98.5°F | Resp 15 | Ht 62.01 in | Wt 176.2 lb

## 2024-10-17 DIAGNOSIS — Z6831 Body mass index (BMI) 31.0-31.9, adult: Secondary | ICD-10-CM | POA: Diagnosis not present

## 2024-10-17 DIAGNOSIS — Z6832 Body mass index (BMI) 32.0-32.9, adult: Secondary | ICD-10-CM | POA: Diagnosis not present

## 2024-10-17 DIAGNOSIS — M810 Age-related osteoporosis without current pathological fracture: Secondary | ICD-10-CM

## 2024-10-17 DIAGNOSIS — E66811 Obesity, class 1: Secondary | ICD-10-CM

## 2024-10-17 DIAGNOSIS — E6609 Other obesity due to excess calories: Secondary | ICD-10-CM | POA: Diagnosis not present

## 2024-10-17 DIAGNOSIS — E669 Obesity, unspecified: Secondary | ICD-10-CM

## 2024-10-17 NOTE — Assessment & Plan Note (Signed)
 Patient is on protonix , denied any chest pain, abdominal pain, indigestion, diarrhea, N/V. We will continue with protonix  treatment.

## 2024-10-17 NOTE — Progress Notes (Signed)
 "  BP 119/67 (BP Location: Left Arm, Patient Position: Sitting, Cuff Size: Normal)   Pulse 66   Temp 98.5 F (36.9 C) (Oral)   Resp 15   Ht 5' 2.01 (1.575 m)   Wt 176 lb 3.2 oz (79.9 kg)   LMP 07/13/2000   SpO2 96%   BMI 32.22 kg/m    Subjective:    Patient ID: Charlene Juarez, female    DOB: 05/16/1951, 74 y.o.   MRN: [id number]  HPI: Charlene Juarez is a 74 y.o. female  Chief Complaint  Patient presents with   Osteoporosis    Back hurts a little since starting the medications at times.    NOTE WRITTEN BY DNP STUDENT.  ASSESSMENT AND PLAN OF CARE REVIEWED WITH STUDENT, AGREE WITH ABOVE FINDINGS AND PLAN.   OSTEOPOROSIS Started Fosamax  on 09/06/24. Right femoral neck -2.6 on 08/30/24. Satisfied with current treatment?: yes Medication side effects: no Medication compliance: excellent compliance Past osteoporosis medications/treatments:  Adequate calcium  & vitamin D : patient take vitamin D3 but not calcium  supplement  Intolerance to bisphosphonates:no Weight bearing exercises: no   Relevant past medical, surgical, family and social history reviewed and updated as indicated. Interim medical history since our last visit reviewed. Allergies and medications reviewed and updated.  Review of Systems  Constitutional:  Negative for activity change, diaphoresis, fatigue and fever.  Respiratory:  Negative for cough, chest tightness, shortness of breath and wheezing.   Cardiovascular:  Negative for chest pain, palpitations and leg swelling.  Gastrointestinal: Negative.   Endocrine: Negative for polydipsia, polyphagia and polyuria.  Neurological: Negative.   Psychiatric/Behavioral:  Negative for behavioral problems, confusion, hallucinations, self-injury, sleep disturbance and suicidal ideas. The patient is not nervous/anxious.     Per HPI unless specifically indicated above     Objective:    BP 119/67 (BP Location: Left Arm, Patient Position: Sitting, Cuff  Size: Normal)   Pulse 66   Temp 98.5 F (36.9 C) (Oral)   Resp 15   Ht 5' 2.01 (1.575 m)   Wt 176 lb 3.2 oz (79.9 kg)   LMP 07/13/2000   SpO2 96%   BMI 32.22 kg/m   Wt Readings from Last 3 Encounters:  10/17/24 176 lb 3.2 oz (79.9 kg)  09/06/24 177 lb 12.8 oz (80.6 kg)  07/30/24 172 lb 12.8 oz (78.4 kg)    Physical Exam Constitutional:      General: She is awake. She is not in acute distress.    Appearance: Normal appearance. She is well-groomed. She is obese. She is not ill-appearing or toxic-appearing.  HENT:     Head: Normocephalic.     Right Ear: Tympanic membrane and ear canal normal. There is no impacted cerumen.     Left Ear: Tympanic membrane and ear canal normal. There is no impacted cerumen.     Nose: No congestion or rhinorrhea.     Mouth/Throat:     Mouth: Mucous membranes are moist.     Pharynx: No oropharyngeal exudate or posterior oropharyngeal erythema.  Eyes:     General: Vision grossly intact. No scleral icterus.       Right eye: No discharge.        Left eye: No discharge.  Neck:     Thyroid : No thyromegaly.     Vascular: No carotid bruit.  Cardiovascular:     Rate and Rhythm: Normal rate and regular rhythm.     Pulses:          Radial pulses  are 2+ on the right side and 2+ on the left side.       Posterior tibial pulses are 1+ on the right side and 1+ on the left side.     Heart sounds: S1 normal and S2 normal. No murmur heard.    No friction rub.  Pulmonary:     Effort: No respiratory distress.     Breath sounds: Normal breath sounds. No stridor. No decreased breath sounds, wheezing or rales.  Chest:     Chest wall: No tenderness.  Abdominal:     General: Bowel sounds are normal. There is no distension.     Palpations: Abdomen is soft.     Tenderness: There is no abdominal tenderness. There is no guarding.  Musculoskeletal:        General: No swelling, tenderness, deformity or signs of injury.     Cervical back: Normal range of motion. No  rigidity or tenderness.     Right lower leg: No edema.     Left lower leg: No edema.  Lymphadenopathy:     Cervical: No cervical adenopathy.  Skin:    General: Skin is warm and dry.     Capillary Refill: Capillary refill takes less than 2 seconds.     Coloration: Skin is not jaundiced or pale.     Findings: No bruising, erythema, lesion or rash.  Neurological:     Mental Status: She is alert and oriented to person, place, and time.     Gait: Gait normal.     Deep Tendon Reflexes: Reflexes normal.     Reflex Scores:      Brachioradialis reflexes are 2+ on the right side and 2+ on the left side.      Patellar reflexes are 2+ on the right side and 2+ on the left side. Psychiatric:        Attention and Perception: She is attentive. She does not perceive auditory or visual hallucinations.        Mood and Affect: Mood normal. Mood is not anxious, depressed or elated. Affect is not labile.        Speech: Speech normal. She is communicative. Speech is not rapid and pressured, delayed, slurred or tangential.        Behavior: Behavior normal. Behavior is cooperative.        Thought Content: Thought content normal.        Cognition and Memory: Cognition normal. Cognition is not impaired. Memory is not impaired. She does not exhibit impaired recent memory or impaired remote memory.        Judgment: Judgment normal.    Results for orders placed or performed in visit on 09/06/24  Comprehensive metabolic panel with GFR   Collection Time: 09/06/24  8:20 AM  Result Value Ref Range   Glucose 78 70 - 99 mg/dL   BUN 18 8 - 27 mg/dL   Creatinine, Ser 9.18 0.57 - 1.00 mg/dL   eGFR 77 >40 fO/fpw/8.26   BUN/Creatinine Ratio 22 12 - 28   Sodium 141 134 - 144 mmol/L   Potassium 3.9 3.5 - 5.2 mmol/L   Chloride 101 96 - 106 mmol/L   CO2 24 20 - 29 mmol/L   Calcium  9.5 8.7 - 10.3 mg/dL   Total Protein 6.3 6.0 - 8.5 g/dL   Albumin 4.3 3.8 - 4.8 g/dL   Globulin, Total 2.0 1.5 - 4.5 g/dL   Bilirubin  Total 0.4 0.0 - 1.2 mg/dL   Alkaline Phosphatase 54 49 -  135 IU/L   AST 27 0 - 40 IU/L   ALT 24 0 - 32 IU/L  Lipid Panel w/o Chol/HDL Ratio   Collection Time: 09/06/24  8:20 AM  Result Value Ref Range   Cholesterol, Total 155 100 - 199 mg/dL   Triglycerides 837 (H) 0 - 149 mg/dL   HDL 47 >60 mg/dL   VLDL Cholesterol Cal 28 5 - 40 mg/dL   LDL Chol Calc (NIH) 80 0 - 99 mg/dL  VITAMIN D  25 Hydroxy (Vit-D Deficiency, Fractures)   Collection Time: 09/06/24  8:20 AM  Result Value Ref Range   Vit D, 25-Hydroxy 56.5 30.0 - 100.0 ng/mL  Bayer DCA Hb A1c Waived   Collection Time: 09/06/24  8:32 AM  Result Value Ref Range   HB A1C (BAYER DCA - WAIVED) 6.2 (H) 4.8 - 5.6 %      Assessment & Plan:   Problem List Items Addressed This Visit       Musculoskeletal and Integument   Osteoporosis   Patient is recently started on Fosamax , repeat BMD, T-score of -2.3; discussed with patient about her new medication, denies any side effect, agreed to continue with current regiment, encourage to keep taking vitamin D3, Denied taking any calcium  supplement.        Other   Obesity - Primary   BMI 32.51, patient was educated about exercise, diet control for weight loss. Patient promise to work on weight loss and exercise after her birthday        Follow up plan: Return in about 7 weeks (around 12/06/2024) for Annual Physical after 11/29/24.      "

## 2024-10-17 NOTE — Assessment & Plan Note (Addendum)
 Patient is recently started on Fosamax , repeat BMD, T-score of -2.3; discussed with patient about her new medication, denies any side effect, agreed to continue with current regiment, encourage to keep taking vitamin D3, Denied taking any calcium  supplement.

## 2024-10-17 NOTE — Assessment & Plan Note (Signed)
 Patient denied any running nose or allergic reaction.

## 2024-10-17 NOTE — Assessment & Plan Note (Signed)
 Patient is on rosuvastatin , we will keep monitoring lipid panel

## 2024-10-17 NOTE — Assessment & Plan Note (Signed)
 Patient denied on this admission any chest pain, palpitation, S1 and S2 heard on assessment, no murmur. We will keep monitoring

## 2024-10-17 NOTE — Assessment & Plan Note (Signed)
 On this visit, we discuss about importance of weight loss and proper diet. Patient acknowledge the need of exercise and promise to get back on diet after her coming birthday. We will recheck patient's A1C in march 2026

## 2024-10-17 NOTE — Assessment & Plan Note (Signed)
 We will keep checking B12 level and continue with B12 supplement

## 2024-10-17 NOTE — Assessment & Plan Note (Signed)
 We will keep monitoring LFT level

## 2024-10-17 NOTE — Assessment & Plan Note (Signed)
 BP on this visit normal (119/67), we will continue with current medications, patient will keep monitoring BP at home.

## 2024-10-17 NOTE — Assessment & Plan Note (Signed)
 Patient did not complaint about any sleep distress or chronic fatigue. Patient was recommended CPAP in the past.

## 2024-10-17 NOTE — Assessment & Plan Note (Signed)
 Patient wears dexcom to monitor her sugar, on jardiance  metformin , glipizide  and on novolog . Glucose was 125 on her dexcom. We will continue to monitor her A1C and educate about diet and exercise

## 2024-10-17 NOTE — Assessment & Plan Note (Signed)
 Patient did not have any respiratory complaint on this admission, lung sound clear and n wheezing. We will keep current medication regiment

## 2024-10-17 NOTE — Assessment & Plan Note (Signed)
 Denied any tinnitus or hearing impairment on this admission. We will keep monitoring

## 2024-10-17 NOTE — Progress Notes (Deleted)
 "  BP 119/67 (BP Location: Left Arm, Patient Position: Sitting, Cuff Size: Normal)   Pulse 66   Temp 98.5 F (36.9 C) (Oral)   Resp 15   Ht 5' 2.01 (1.575 m)   Wt 176 lb 3.2 oz (79.9 kg)   LMP 07/13/2000   SpO2 96%   BMI 32.22 kg/m    Subjective:    Patient ID: Charlene Juarez, female    DOB: 05/20/1951, 74 y.o.   MRN: [id number]  HPI: Charlene Juarez is a 74 y.o. female  Chief Complaint  Patient presents with   Osteoporosis    Back hurts a little since starting the medications at times.    OSTEOPOROSIS Started Fosamax  on 09/06/24. Right femoral neck -2.6 on 08/30/24. Satisfied with current treatment?: {Blank single:19197::yes,no} Medication side effects: {Blank single:19197::yes,no} Medication compliance: {Blank single:19197::excellent compliance,good compliance,fair compliance,poor compliance} Past osteoporosis medications/treatments:  Adequate calcium  & vitamin D : {Blank single:19197::yes,no} Intolerance to bisphosphonates:{Blank single:19197::yes,no} Weight bearing exercises: {Blank single:19197::yes,no}   Relevant past medical, surgical, family and social history reviewed and updated as indicated. Interim medical history since our last visit reviewed. Allergies and medications reviewed and updated.  Review of Systems  Per HPI unless specifically indicated above     Objective:    BP 119/67 (BP Location: Left Arm, Patient Position: Sitting, Cuff Size: Normal)   Pulse 66   Temp 98.5 F (36.9 C) (Oral)   Resp 15   Ht 5' 2.01 (1.575 m)   Wt 176 lb 3.2 oz (79.9 kg)   LMP 07/13/2000   SpO2 96%   BMI 32.22 kg/m   Wt Readings from Last 3 Encounters:  10/17/24 176 lb 3.2 oz (79.9 kg)  09/06/24 177 lb 12.8 oz (80.6 kg)  07/30/24 172 lb 12.8 oz (78.4 kg)    Physical Exam  Results for orders placed or performed in visit on 09/06/24  Comprehensive metabolic panel with GFR   Collection Time: 09/06/24  8:20 AM   Result Value Ref Range   Glucose 78 70 - 99 mg/dL   BUN 18 8 - 27 mg/dL   Creatinine, Ser 9.18 0.57 - 1.00 mg/dL   eGFR 77 >40 fO/fpw/8.26   BUN/Creatinine Ratio 22 12 - 28   Sodium 141 134 - 144 mmol/L   Potassium 3.9 3.5 - 5.2 mmol/L   Chloride 101 96 - 106 mmol/L   CO2 24 20 - 29 mmol/L   Calcium  9.5 8.7 - 10.3 mg/dL   Total Protein 6.3 6.0 - 8.5 g/dL   Albumin 4.3 3.8 - 4.8 g/dL   Globulin, Total 2.0 1.5 - 4.5 g/dL   Bilirubin Total 0.4 0.0 - 1.2 mg/dL   Alkaline Phosphatase 54 49 - 135 IU/L   AST 27 0 - 40 IU/L   ALT 24 0 - 32 IU/L  Lipid Panel w/o Chol/HDL Ratio   Collection Time: 09/06/24  8:20 AM  Result Value Ref Range   Cholesterol, Total 155 100 - 199 mg/dL   Triglycerides 837 (H) 0 - 149 mg/dL   HDL 47 >60 mg/dL   VLDL Cholesterol Cal 28 5 - 40 mg/dL   LDL Chol Calc (NIH) 80 0 - 99 mg/dL  VITAMIN D  25 Hydroxy (Vit-D Deficiency, Fractures)   Collection Time: 09/06/24  8:20 AM  Result Value Ref Range   Vit D, 25-Hydroxy 56.5 30.0 - 100.0 ng/mL  Bayer DCA Hb A1c Waived   Collection Time: 09/06/24  8:32 AM  Result Value Ref Range   HB A1C (BAYER  DCA - WAIVED) 6.2 (H) 4.8 - 5.6 %      Assessment & Plan:   Problem List Items Addressed This Visit   None    Follow up plan: Return in about 7 weeks (around 12/06/2024) for Annual Physical after 11/29/24.      "

## 2024-10-17 NOTE — Assessment & Plan Note (Addendum)
 Patient is taking vitamin D3 dexa scan, on fosamax  to maintain bone density

## 2024-10-17 NOTE — Assessment & Plan Note (Signed)
 HR 66 on this visit, patient denied any palpitation or chest pain. We will continue with current medication regiment

## 2024-10-17 NOTE — Assessment & Plan Note (Signed)
 BMI 32.51, patient was educated about exercise, diet control for weight loss. Patient promise to work on weight loss and exercise after her birthday

## 2024-10-20 ENCOUNTER — Other Ambulatory Visit: Payer: Self-pay | Admitting: Nurse Practitioner

## 2024-10-22 NOTE — Telephone Encounter (Signed)
 Requested Prescriptions  Pending Prescriptions Disp Refills   metFORMIN  (GLUCOPHAGE ) 1000 MG tablet [Pharmacy Med Name: METFORMIN  HCL 1,000 MG TABLET] 180 tablet 0    Sig: TAKE 1 TABLET (1,000 MG TOTAL) BY MOUTH TWICE A DAY WITH FOOD     Endocrinology:  Diabetes - Biguanides Passed - 10/22/2024  4:25 PM      Passed - Cr in normal range and within 360 days    Creatinine, Ser  Date Value Ref Range Status  09/06/2024 0.81 0.57 - 1.00 mg/dL Final         Passed - HBA1C is between 0 and 7.9 and within 180 days    HB A1C (BAYER DCA - WAIVED)  Date Value Ref Range Status  09/06/2024 6.2 (H) 4.8 - 5.6 % Final    Comment:             Prediabetes: 5.7 - 6.4          Diabetes: >6.4          Glycemic control for adults with diabetes: <7.0          Passed - eGFR in normal range and within 360 days    GFR calc Af Amer  Date Value Ref Range Status  05/08/2020 95 >59 mL/min/1.73 Final    Comment:    **[facility] currently reports eGFR in compliance with the current**   recommendations of the [employer]. [facility] will   update reporting as new guidelines are published from the NKF-ASN   Task force.    GFR, Estimated  Date Value Ref Range Status  09/27/2023 >60 >60 mL/min Final    Comment:    (NOTE) Calculated using the CKD-EPI Creatinine Equation (2021)    eGFR  Date Value Ref Range Status  09/06/2024 77 >59 mL/min/1.73 Final         Passed - B12 Level in normal range and within 720 days    Vitamin B-12  Date Value Ref Range Status  11/30/2023 583 232 - 1,245 pg/mL Final         Passed - Valid encounter within last 6 months    Recent Outpatient Visits           5 days ago Class 1 obesity due to excess calories with serious comorbidity and body mass index (BMI) of 31.0 to 31.9 in adult   [place] [facility] [place], [place] T, NP   1 month ago Insulin  dependent type 2 diabetes mellitus (HCC)   [place] [facility] [place], [place] T,  NP   4 months ago Insulin  dependent type 2 diabetes mellitus (HCC)   [place] [facility] [place], [place] T, NP   7 months ago Insulin  dependent type 2 diabetes mellitus (HCC)   [place] [facility] [place], [place] T, NP   10 months ago Insulin  dependent type 2 diabetes mellitus (HCC)   [facility] [place], [place] T, NP              Passed - CBC within normal limits and completed in the last 12 months    WBC  Date Value Ref Range Status  11/30/2023 7.8 3.4 - 10.8 x10E3/uL Final  09/27/2023 10.6 (H) 4.0 - 10.5 K/uL Final   RBC  Date Value Ref Range Status  11/30/2023 5.23 3.77 - 5.28 x10E6/uL Final  09/27/2023 5.35 (H) 3.87 - 5.11 MIL/uL Final   Hemoglobin  Date Value Ref Range Status  11/30/2023 14.9 11.1 -  15.9 g/dL Final   Hematocrit  Date Value Ref Range Status  11/30/2023 45.4 34.0 - 46.6 % Final   MCHC  Date Value Ref Range Status  11/30/2023 32.8 31.5 - 35.7 g/dL Final  98/92/7974 67.2 30.0 - 36.0 g/dL Final   [facility]  Date Value Ref Range Status  11/30/2023 28.5 26.6 - 33.0 pg Final  09/27/2023 28.4 26.0 - 34.0 pg Final   MCV  Date Value Ref Range Status  11/30/2023 87 79 - 97 fL Final   No results found for: PLTCOUNTKUC, LABPLAT, POCPLA RDW  Date Value Ref Range Status  11/30/2023 14.1 11.7 - 15.4 % Final

## 2024-12-07 ENCOUNTER — Ambulatory Visit: Admitting: Nurse Practitioner

## 2025-01-08 ENCOUNTER — Ambulatory Visit
